# Patient Record
Sex: Female | Born: 1935 | Race: White | Hispanic: No | Smoking: Never smoker
Health system: Southern US, Community
[De-identification: ages and names within clinical notes are randomized; demographics above are authoritative.]

## PROBLEM LIST (undated history)

## (undated) DIAGNOSIS — I10 Essential (primary) hypertension: Secondary | ICD-10-CM

## (undated) DIAGNOSIS — I4891 Unspecified atrial fibrillation: Secondary | ICD-10-CM

## (undated) DIAGNOSIS — R011 Cardiac murmur, unspecified: Secondary | ICD-10-CM

## (undated) DIAGNOSIS — C4490 Unspecified malignant neoplasm of skin, unspecified: Secondary | ICD-10-CM

## (undated) DIAGNOSIS — S8990XA Unspecified injury of unspecified lower leg, initial encounter: Secondary | ICD-10-CM

## (undated) DIAGNOSIS — J45909 Unspecified asthma, uncomplicated: Secondary | ICD-10-CM

## (undated) DIAGNOSIS — N289 Disorder of kidney and ureter, unspecified: Secondary | ICD-10-CM

## (undated) HISTORY — DX: Unspecified atrial fibrillation: I48.91

## (undated) HISTORY — PX: TONSILLECTOMY: SUR1361

## (undated) HISTORY — PX: TOOTH EXTRACTION: SUR596

## (undated) HISTORY — PX: COLONOSCOPY: SHX174

## (undated) HISTORY — PX: RENAL ARTERY STENT: SHX2321

## (undated) HISTORY — PX: ABDOMINAL HYSTERECTOMY: SHX81

---

## 2004-04-04 ENCOUNTER — Other Ambulatory Visit: Payer: Self-pay

## 2004-04-07 ENCOUNTER — Ambulatory Visit: Payer: Self-pay | Admitting: Otolaryngology

## 2004-04-11 ENCOUNTER — Other Ambulatory Visit: Payer: Self-pay

## 2004-04-11 ENCOUNTER — Inpatient Hospital Stay: Payer: Self-pay | Admitting: Internal Medicine

## 2004-12-09 ENCOUNTER — Ambulatory Visit: Payer: Self-pay | Admitting: Cardiology

## 2004-12-13 ENCOUNTER — Ambulatory Visit: Payer: Self-pay | Admitting: Internal Medicine

## 2005-08-01 ENCOUNTER — Ambulatory Visit: Payer: Self-pay | Admitting: Internal Medicine

## 2006-03-08 ENCOUNTER — Ambulatory Visit: Payer: Self-pay | Admitting: Internal Medicine

## 2007-04-09 ENCOUNTER — Ambulatory Visit: Payer: Self-pay | Admitting: Internal Medicine

## 2008-03-09 ENCOUNTER — Ambulatory Visit: Payer: Self-pay | Admitting: Internal Medicine

## 2008-04-14 ENCOUNTER — Ambulatory Visit: Payer: Self-pay | Admitting: Internal Medicine

## 2009-04-29 ENCOUNTER — Ambulatory Visit: Payer: Self-pay | Admitting: Internal Medicine

## 2009-05-04 ENCOUNTER — Ambulatory Visit: Payer: Self-pay | Admitting: Internal Medicine

## 2009-09-20 ENCOUNTER — Ambulatory Visit: Payer: Self-pay | Admitting: Unknown Physician Specialty

## 2009-10-19 ENCOUNTER — Other Ambulatory Visit: Payer: Self-pay | Admitting: Rheumatology

## 2009-12-27 ENCOUNTER — Ambulatory Visit: Payer: Self-pay | Admitting: Vascular Surgery

## 2010-08-05 ENCOUNTER — Ambulatory Visit: Payer: Self-pay | Admitting: Internal Medicine

## 2011-06-12 DIAGNOSIS — F411 Generalized anxiety disorder: Secondary | ICD-10-CM | POA: Diagnosis not present

## 2011-06-12 DIAGNOSIS — M159 Polyosteoarthritis, unspecified: Secondary | ICD-10-CM | POA: Diagnosis not present

## 2011-06-12 DIAGNOSIS — N390 Urinary tract infection, site not specified: Secondary | ICD-10-CM | POA: Diagnosis not present

## 2011-06-12 DIAGNOSIS — I150 Renovascular hypertension: Secondary | ICD-10-CM | POA: Diagnosis not present

## 2011-06-12 DIAGNOSIS — K219 Gastro-esophageal reflux disease without esophagitis: Secondary | ICD-10-CM | POA: Diagnosis not present

## 2011-06-12 DIAGNOSIS — E782 Mixed hyperlipidemia: Secondary | ICD-10-CM | POA: Diagnosis not present

## 2011-06-12 DIAGNOSIS — J45909 Unspecified asthma, uncomplicated: Secondary | ICD-10-CM | POA: Diagnosis not present

## 2011-08-04 DIAGNOSIS — L570 Actinic keratosis: Secondary | ICD-10-CM | POA: Diagnosis not present

## 2011-08-04 DIAGNOSIS — Z85828 Personal history of other malignant neoplasm of skin: Secondary | ICD-10-CM | POA: Diagnosis not present

## 2011-08-04 DIAGNOSIS — C4492 Squamous cell carcinoma of skin, unspecified: Secondary | ICD-10-CM | POA: Diagnosis not present

## 2011-09-05 DIAGNOSIS — H35349 Macular cyst, hole, or pseudohole, unspecified eye: Secondary | ICD-10-CM | POA: Diagnosis not present

## 2011-09-05 DIAGNOSIS — H04129 Dry eye syndrome of unspecified lacrimal gland: Secondary | ICD-10-CM | POA: Diagnosis not present

## 2011-09-18 DIAGNOSIS — C44320 Squamous cell carcinoma of skin of unspecified parts of face: Secondary | ICD-10-CM | POA: Diagnosis not present

## 2011-10-10 DIAGNOSIS — M159 Polyosteoarthritis, unspecified: Secondary | ICD-10-CM | POA: Diagnosis not present

## 2011-10-10 DIAGNOSIS — E782 Mixed hyperlipidemia: Secondary | ICD-10-CM | POA: Diagnosis not present

## 2011-10-10 DIAGNOSIS — I150 Renovascular hypertension: Secondary | ICD-10-CM | POA: Diagnosis not present

## 2011-10-10 DIAGNOSIS — I701 Atherosclerosis of renal artery: Secondary | ICD-10-CM | POA: Diagnosis not present

## 2011-10-10 DIAGNOSIS — F411 Generalized anxiety disorder: Secondary | ICD-10-CM | POA: Diagnosis not present

## 2011-10-10 DIAGNOSIS — J45909 Unspecified asthma, uncomplicated: Secondary | ICD-10-CM | POA: Diagnosis not present

## 2011-10-24 DIAGNOSIS — Z85828 Personal history of other malignant neoplasm of skin: Secondary | ICD-10-CM | POA: Diagnosis not present

## 2011-10-24 DIAGNOSIS — L570 Actinic keratosis: Secondary | ICD-10-CM | POA: Diagnosis not present

## 2011-11-20 DIAGNOSIS — I10 Essential (primary) hypertension: Secondary | ICD-10-CM | POA: Diagnosis not present

## 2011-11-20 DIAGNOSIS — I150 Renovascular hypertension: Secondary | ICD-10-CM | POA: Diagnosis not present

## 2011-11-20 DIAGNOSIS — E782 Mixed hyperlipidemia: Secondary | ICD-10-CM | POA: Diagnosis not present

## 2011-11-20 DIAGNOSIS — Z79899 Other long term (current) drug therapy: Secondary | ICD-10-CM | POA: Diagnosis not present

## 2011-12-15 DIAGNOSIS — Z85828 Personal history of other malignant neoplasm of skin: Secondary | ICD-10-CM | POA: Diagnosis not present

## 2011-12-15 DIAGNOSIS — L570 Actinic keratosis: Secondary | ICD-10-CM | POA: Diagnosis not present

## 2011-12-18 DIAGNOSIS — I359 Nonrheumatic aortic valve disorder, unspecified: Secondary | ICD-10-CM | POA: Diagnosis not present

## 2011-12-18 DIAGNOSIS — J45909 Unspecified asthma, uncomplicated: Secondary | ICD-10-CM | POA: Diagnosis not present

## 2011-12-18 DIAGNOSIS — E782 Mixed hyperlipidemia: Secondary | ICD-10-CM | POA: Diagnosis not present

## 2011-12-18 DIAGNOSIS — I150 Renovascular hypertension: Secondary | ICD-10-CM | POA: Diagnosis not present

## 2011-12-18 DIAGNOSIS — J309 Allergic rhinitis, unspecified: Secondary | ICD-10-CM | POA: Diagnosis not present

## 2012-01-02 ENCOUNTER — Ambulatory Visit: Payer: Self-pay | Admitting: Internal Medicine

## 2012-01-02 DIAGNOSIS — Z1231 Encounter for screening mammogram for malignant neoplasm of breast: Secondary | ICD-10-CM | POA: Diagnosis not present

## 2012-01-02 DIAGNOSIS — R928 Other abnormal and inconclusive findings on diagnostic imaging of breast: Secondary | ICD-10-CM | POA: Diagnosis not present

## 2012-03-22 DIAGNOSIS — L578 Other skin changes due to chronic exposure to nonionizing radiation: Secondary | ICD-10-CM | POA: Diagnosis not present

## 2012-03-22 DIAGNOSIS — Z85828 Personal history of other malignant neoplasm of skin: Secondary | ICD-10-CM | POA: Diagnosis not present

## 2012-03-25 DIAGNOSIS — Z23 Encounter for immunization: Secondary | ICD-10-CM | POA: Diagnosis not present

## 2012-03-26 DIAGNOSIS — I10 Essential (primary) hypertension: Secondary | ICD-10-CM | POA: Diagnosis not present

## 2012-03-26 DIAGNOSIS — R079 Chest pain, unspecified: Secondary | ICD-10-CM | POA: Diagnosis not present

## 2012-03-26 DIAGNOSIS — R5381 Other malaise: Secondary | ICD-10-CM | POA: Diagnosis not present

## 2012-03-26 DIAGNOSIS — I359 Nonrheumatic aortic valve disorder, unspecified: Secondary | ICD-10-CM | POA: Diagnosis not present

## 2012-03-26 DIAGNOSIS — R110 Nausea: Secondary | ICD-10-CM | POA: Diagnosis not present

## 2012-03-26 DIAGNOSIS — I150 Renovascular hypertension: Secondary | ICD-10-CM | POA: Diagnosis not present

## 2012-03-26 DIAGNOSIS — K219 Gastro-esophageal reflux disease without esophagitis: Secondary | ICD-10-CM | POA: Diagnosis not present

## 2012-03-26 DIAGNOSIS — F329 Major depressive disorder, single episode, unspecified: Secondary | ICD-10-CM | POA: Diagnosis not present

## 2012-04-02 DIAGNOSIS — I10 Essential (primary) hypertension: Secondary | ICD-10-CM | POA: Diagnosis not present

## 2012-04-02 DIAGNOSIS — I359 Nonrheumatic aortic valve disorder, unspecified: Secondary | ICD-10-CM | POA: Diagnosis not present

## 2012-04-09 DIAGNOSIS — R011 Cardiac murmur, unspecified: Secondary | ICD-10-CM | POA: Diagnosis not present

## 2012-04-09 DIAGNOSIS — R7989 Other specified abnormal findings of blood chemistry: Secondary | ICD-10-CM | POA: Diagnosis not present

## 2012-04-09 DIAGNOSIS — R0602 Shortness of breath: Secondary | ICD-10-CM | POA: Diagnosis not present

## 2012-04-09 DIAGNOSIS — R944 Abnormal results of kidney function studies: Secondary | ICD-10-CM | POA: Diagnosis not present

## 2012-04-15 DIAGNOSIS — I10 Essential (primary) hypertension: Secondary | ICD-10-CM | POA: Diagnosis not present

## 2012-04-15 DIAGNOSIS — J449 Chronic obstructive pulmonary disease, unspecified: Secondary | ICD-10-CM | POA: Diagnosis not present

## 2012-04-22 DIAGNOSIS — L570 Actinic keratosis: Secondary | ICD-10-CM | POA: Diagnosis not present

## 2012-04-29 DIAGNOSIS — L570 Actinic keratosis: Secondary | ICD-10-CM | POA: Diagnosis not present

## 2012-05-10 DIAGNOSIS — L259 Unspecified contact dermatitis, unspecified cause: Secondary | ICD-10-CM | POA: Diagnosis not present

## 2012-05-10 DIAGNOSIS — IMO0002 Reserved for concepts with insufficient information to code with codable children: Secondary | ICD-10-CM | POA: Diagnosis not present

## 2012-05-10 DIAGNOSIS — L138 Other specified bullous disorders: Secondary | ICD-10-CM | POA: Diagnosis not present

## 2012-05-15 DIAGNOSIS — D492 Neoplasm of unspecified behavior of bone, soft tissue, and skin: Secondary | ICD-10-CM | POA: Diagnosis not present

## 2012-05-15 DIAGNOSIS — L089 Local infection of the skin and subcutaneous tissue, unspecified: Secondary | ICD-10-CM | POA: Insufficient documentation

## 2012-05-15 DIAGNOSIS — C7650 Malignant neoplasm of unspecified lower limb: Secondary | ICD-10-CM | POA: Diagnosis not present

## 2012-06-07 DIAGNOSIS — D492 Neoplasm of unspecified behavior of bone, soft tissue, and skin: Secondary | ICD-10-CM | POA: Diagnosis not present

## 2012-06-07 DIAGNOSIS — D485 Neoplasm of uncertain behavior of skin: Secondary | ICD-10-CM | POA: Diagnosis not present

## 2012-06-07 DIAGNOSIS — Z85828 Personal history of other malignant neoplasm of skin: Secondary | ICD-10-CM | POA: Diagnosis not present

## 2012-06-12 DIAGNOSIS — L570 Actinic keratosis: Secondary | ICD-10-CM | POA: Diagnosis not present

## 2012-06-12 DIAGNOSIS — C44721 Squamous cell carcinoma of skin of unspecified lower limb, including hip: Secondary | ICD-10-CM | POA: Diagnosis not present

## 2012-06-12 DIAGNOSIS — D492 Neoplasm of unspecified behavior of bone, soft tissue, and skin: Secondary | ICD-10-CM | POA: Diagnosis not present

## 2012-06-24 DIAGNOSIS — R011 Cardiac murmur, unspecified: Secondary | ICD-10-CM | POA: Diagnosis not present

## 2012-06-24 DIAGNOSIS — N189 Chronic kidney disease, unspecified: Secondary | ICD-10-CM | POA: Diagnosis not present

## 2012-06-24 DIAGNOSIS — J309 Allergic rhinitis, unspecified: Secondary | ICD-10-CM | POA: Diagnosis not present

## 2012-06-24 DIAGNOSIS — N318 Other neuromuscular dysfunction of bladder: Secondary | ICD-10-CM | POA: Diagnosis not present

## 2012-06-24 DIAGNOSIS — K5900 Constipation, unspecified: Secondary | ICD-10-CM | POA: Diagnosis not present

## 2012-06-24 DIAGNOSIS — R32 Unspecified urinary incontinence: Secondary | ICD-10-CM | POA: Diagnosis not present

## 2012-06-24 DIAGNOSIS — I150 Renovascular hypertension: Secondary | ICD-10-CM | POA: Diagnosis not present

## 2012-06-24 DIAGNOSIS — K219 Gastro-esophageal reflux disease without esophagitis: Secondary | ICD-10-CM | POA: Diagnosis not present

## 2012-08-01 DIAGNOSIS — D0470 Carcinoma in situ of skin of unspecified lower limb, including hip: Secondary | ICD-10-CM | POA: Diagnosis not present

## 2012-08-07 DIAGNOSIS — C44721 Squamous cell carcinoma of skin of unspecified lower limb, including hip: Secondary | ICD-10-CM | POA: Diagnosis not present

## 2012-10-09 DIAGNOSIS — Z85828 Personal history of other malignant neoplasm of skin: Secondary | ICD-10-CM | POA: Diagnosis not present

## 2012-10-09 DIAGNOSIS — L570 Actinic keratosis: Secondary | ICD-10-CM | POA: Diagnosis not present

## 2012-10-18 DIAGNOSIS — L0291 Cutaneous abscess, unspecified: Secondary | ICD-10-CM | POA: Diagnosis not present

## 2012-10-18 DIAGNOSIS — L0390 Cellulitis, unspecified: Secondary | ICD-10-CM | POA: Diagnosis not present

## 2012-10-23 DIAGNOSIS — I6529 Occlusion and stenosis of unspecified carotid artery: Secondary | ICD-10-CM | POA: Diagnosis not present

## 2012-10-23 DIAGNOSIS — J45909 Unspecified asthma, uncomplicated: Secondary | ICD-10-CM | POA: Diagnosis not present

## 2012-10-23 DIAGNOSIS — I10 Essential (primary) hypertension: Secondary | ICD-10-CM | POA: Diagnosis not present

## 2012-10-23 DIAGNOSIS — Z006 Encounter for examination for normal comparison and control in clinical research program: Secondary | ICD-10-CM | POA: Diagnosis not present

## 2012-10-25 DIAGNOSIS — L0390 Cellulitis, unspecified: Secondary | ICD-10-CM | POA: Diagnosis not present

## 2012-10-25 DIAGNOSIS — L0291 Cutaneous abscess, unspecified: Secondary | ICD-10-CM | POA: Diagnosis not present

## 2012-11-14 DIAGNOSIS — L0291 Cutaneous abscess, unspecified: Secondary | ICD-10-CM | POA: Diagnosis not present

## 2012-11-14 DIAGNOSIS — L0390 Cellulitis, unspecified: Secondary | ICD-10-CM | POA: Diagnosis not present

## 2012-11-14 DIAGNOSIS — L570 Actinic keratosis: Secondary | ICD-10-CM | POA: Diagnosis not present

## 2012-12-12 DIAGNOSIS — L570 Actinic keratosis: Secondary | ICD-10-CM | POA: Diagnosis not present

## 2012-12-12 DIAGNOSIS — D485 Neoplasm of uncertain behavior of skin: Secondary | ICD-10-CM | POA: Diagnosis not present

## 2012-12-12 DIAGNOSIS — L089 Local infection of the skin and subcutaneous tissue, unspecified: Secondary | ICD-10-CM | POA: Diagnosis not present

## 2013-01-02 DIAGNOSIS — L570 Actinic keratosis: Secondary | ICD-10-CM | POA: Diagnosis not present

## 2013-01-02 DIAGNOSIS — L259 Unspecified contact dermatitis, unspecified cause: Secondary | ICD-10-CM | POA: Diagnosis not present

## 2013-01-02 DIAGNOSIS — D485 Neoplasm of uncertain behavior of skin: Secondary | ICD-10-CM | POA: Diagnosis not present

## 2013-01-24 DIAGNOSIS — L259 Unspecified contact dermatitis, unspecified cause: Secondary | ICD-10-CM | POA: Diagnosis not present

## 2013-01-29 DIAGNOSIS — L259 Unspecified contact dermatitis, unspecified cause: Secondary | ICD-10-CM | POA: Diagnosis not present

## 2013-02-24 DIAGNOSIS — N390 Urinary tract infection, site not specified: Secondary | ICD-10-CM | POA: Diagnosis not present

## 2013-02-24 DIAGNOSIS — I150 Renovascular hypertension: Secondary | ICD-10-CM | POA: Diagnosis not present

## 2013-02-24 DIAGNOSIS — F411 Generalized anxiety disorder: Secondary | ICD-10-CM | POA: Diagnosis not present

## 2013-02-24 DIAGNOSIS — R339 Retention of urine, unspecified: Secondary | ICD-10-CM | POA: Diagnosis not present

## 2013-02-24 DIAGNOSIS — N189 Chronic kidney disease, unspecified: Secondary | ICD-10-CM | POA: Diagnosis not present

## 2013-02-24 DIAGNOSIS — L259 Unspecified contact dermatitis, unspecified cause: Secondary | ICD-10-CM | POA: Diagnosis not present

## 2013-02-24 DIAGNOSIS — R011 Cardiac murmur, unspecified: Secondary | ICD-10-CM | POA: Diagnosis not present

## 2013-02-24 DIAGNOSIS — R300 Dysuria: Secondary | ICD-10-CM | POA: Diagnosis not present

## 2013-02-26 DIAGNOSIS — L578 Other skin changes due to chronic exposure to nonionizing radiation: Secondary | ICD-10-CM | POA: Diagnosis not present

## 2013-02-26 DIAGNOSIS — Z85828 Personal history of other malignant neoplasm of skin: Secondary | ICD-10-CM | POA: Diagnosis not present

## 2013-02-26 DIAGNOSIS — C44721 Squamous cell carcinoma of skin of unspecified lower limb, including hip: Secondary | ICD-10-CM | POA: Diagnosis not present

## 2013-02-26 DIAGNOSIS — L908 Other atrophic disorders of skin: Secondary | ICD-10-CM | POA: Diagnosis not present

## 2013-03-12 DIAGNOSIS — Z23 Encounter for immunization: Secondary | ICD-10-CM | POA: Diagnosis not present

## 2013-03-13 ENCOUNTER — Ambulatory Visit: Payer: Self-pay | Admitting: Physician Assistant
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
## 2013-03-17 DIAGNOSIS — R011 Cardiac murmur, unspecified: Secondary | ICD-10-CM | POA: Diagnosis not present

## 2013-03-24 DIAGNOSIS — I10 Essential (primary) hypertension: Secondary | ICD-10-CM | POA: Diagnosis not present

## 2013-03-24 DIAGNOSIS — N390 Urinary tract infection, site not specified: Secondary | ICD-10-CM | POA: Diagnosis not present

## 2013-03-24 DIAGNOSIS — Z0000 Encounter for general adult medical examination without abnormal findings: Secondary | ICD-10-CM | POA: Diagnosis not present

## 2013-03-24 DIAGNOSIS — N189 Chronic kidney disease, unspecified: Secondary | ICD-10-CM | POA: Diagnosis not present

## 2013-03-24 DIAGNOSIS — R7989 Other specified abnormal findings of blood chemistry: Secondary | ICD-10-CM | POA: Diagnosis not present

## 2013-04-04 DIAGNOSIS — L259 Unspecified contact dermatitis, unspecified cause: Secondary | ICD-10-CM | POA: Diagnosis not present

## 2013-04-09 DIAGNOSIS — L259 Unspecified contact dermatitis, unspecified cause: Secondary | ICD-10-CM | POA: Diagnosis not present

## 2013-04-11 DIAGNOSIS — L259 Unspecified contact dermatitis, unspecified cause: Secondary | ICD-10-CM | POA: Diagnosis not present

## 2013-04-15 DIAGNOSIS — L578 Other skin changes due to chronic exposure to nonionizing radiation: Secondary | ICD-10-CM | POA: Diagnosis not present

## 2013-04-15 DIAGNOSIS — Z85828 Personal history of other malignant neoplasm of skin: Secondary | ICD-10-CM | POA: Diagnosis not present

## 2013-07-09 DIAGNOSIS — L259 Unspecified contact dermatitis, unspecified cause: Secondary | ICD-10-CM | POA: Diagnosis not present

## 2013-07-09 DIAGNOSIS — I6529 Occlusion and stenosis of unspecified carotid artery: Secondary | ICD-10-CM | POA: Diagnosis not present

## 2013-07-09 DIAGNOSIS — K219 Gastro-esophageal reflux disease without esophagitis: Secondary | ICD-10-CM | POA: Diagnosis not present

## 2013-07-09 DIAGNOSIS — Q251 Coarctation of aorta: Secondary | ICD-10-CM | POA: Diagnosis not present

## 2013-07-09 DIAGNOSIS — D649 Anemia, unspecified: Secondary | ICD-10-CM | POA: Diagnosis not present

## 2013-07-09 DIAGNOSIS — J45909 Unspecified asthma, uncomplicated: Secondary | ICD-10-CM | POA: Diagnosis not present

## 2013-07-09 DIAGNOSIS — M810 Age-related osteoporosis without current pathological fracture: Secondary | ICD-10-CM | POA: Diagnosis not present

## 2013-07-09 DIAGNOSIS — Q2529 Other atresia of aorta: Secondary | ICD-10-CM | POA: Diagnosis not present

## 2013-07-09 DIAGNOSIS — I10 Essential (primary) hypertension: Secondary | ICD-10-CM | POA: Diagnosis not present

## 2013-09-01 DIAGNOSIS — J45909 Unspecified asthma, uncomplicated: Secondary | ICD-10-CM | POA: Diagnosis not present

## 2013-09-01 DIAGNOSIS — R011 Cardiac murmur, unspecified: Secondary | ICD-10-CM | POA: Diagnosis not present

## 2013-09-01 DIAGNOSIS — J309 Allergic rhinitis, unspecified: Secondary | ICD-10-CM | POA: Diagnosis not present

## 2013-09-01 DIAGNOSIS — E2839 Other primary ovarian failure: Secondary | ICD-10-CM | POA: Diagnosis not present

## 2013-09-01 DIAGNOSIS — I10 Essential (primary) hypertension: Secondary | ICD-10-CM | POA: Diagnosis not present

## 2013-09-01 DIAGNOSIS — F411 Generalized anxiety disorder: Secondary | ICD-10-CM | POA: Diagnosis not present

## 2013-09-01 DIAGNOSIS — E782 Mixed hyperlipidemia: Secondary | ICD-10-CM | POA: Diagnosis not present

## 2013-09-16 DIAGNOSIS — I150 Renovascular hypertension: Secondary | ICD-10-CM | POA: Diagnosis not present

## 2013-09-16 DIAGNOSIS — M810 Age-related osteoporosis without current pathological fracture: Secondary | ICD-10-CM | POA: Diagnosis not present

## 2013-09-16 DIAGNOSIS — I10 Essential (primary) hypertension: Secondary | ICD-10-CM | POA: Diagnosis not present

## 2013-10-10 DIAGNOSIS — R7989 Other specified abnormal findings of blood chemistry: Secondary | ICD-10-CM | POA: Diagnosis not present

## 2013-10-10 DIAGNOSIS — D539 Nutritional anemia, unspecified: Secondary | ICD-10-CM | POA: Diagnosis not present

## 2013-10-21 DIAGNOSIS — M159 Polyosteoarthritis, unspecified: Secondary | ICD-10-CM | POA: Diagnosis not present

## 2013-10-21 DIAGNOSIS — I150 Renovascular hypertension: Secondary | ICD-10-CM | POA: Diagnosis not present

## 2013-10-21 DIAGNOSIS — I10 Essential (primary) hypertension: Secondary | ICD-10-CM | POA: Diagnosis not present

## 2013-10-23 DIAGNOSIS — I10 Essential (primary) hypertension: Secondary | ICD-10-CM | POA: Diagnosis not present

## 2013-10-23 DIAGNOSIS — I150 Renovascular hypertension: Secondary | ICD-10-CM | POA: Diagnosis not present

## 2013-11-11 DIAGNOSIS — I150 Renovascular hypertension: Secondary | ICD-10-CM | POA: Diagnosis not present

## 2013-11-11 DIAGNOSIS — F411 Generalized anxiety disorder: Secondary | ICD-10-CM | POA: Diagnosis not present

## 2013-11-11 DIAGNOSIS — J45909 Unspecified asthma, uncomplicated: Secondary | ICD-10-CM | POA: Diagnosis not present

## 2013-11-11 DIAGNOSIS — I10 Essential (primary) hypertension: Secondary | ICD-10-CM | POA: Diagnosis not present

## 2013-11-13 DIAGNOSIS — H04129 Dry eye syndrome of unspecified lacrimal gland: Secondary | ICD-10-CM | POA: Diagnosis not present

## 2013-11-13 DIAGNOSIS — H35349 Macular cyst, hole, or pseudohole, unspecified eye: Secondary | ICD-10-CM | POA: Diagnosis not present

## 2013-11-13 DIAGNOSIS — H5200 Hypermetropia, unspecified eye: Secondary | ICD-10-CM | POA: Diagnosis not present

## 2013-11-13 DIAGNOSIS — H2510 Age-related nuclear cataract, unspecified eye: Secondary | ICD-10-CM | POA: Diagnosis not present

## 2013-12-08 DIAGNOSIS — I150 Renovascular hypertension: Secondary | ICD-10-CM | POA: Diagnosis not present

## 2013-12-18 DIAGNOSIS — F411 Generalized anxiety disorder: Secondary | ICD-10-CM | POA: Diagnosis not present

## 2013-12-18 DIAGNOSIS — E782 Mixed hyperlipidemia: Secondary | ICD-10-CM | POA: Diagnosis not present

## 2013-12-18 DIAGNOSIS — I150 Renovascular hypertension: Secondary | ICD-10-CM | POA: Diagnosis not present

## 2013-12-18 DIAGNOSIS — I701 Atherosclerosis of renal artery: Secondary | ICD-10-CM | POA: Diagnosis not present

## 2013-12-31 DIAGNOSIS — Z85828 Personal history of other malignant neoplasm of skin: Secondary | ICD-10-CM | POA: Diagnosis not present

## 2013-12-31 DIAGNOSIS — C44721 Squamous cell carcinoma of skin of unspecified lower limb, including hip: Secondary | ICD-10-CM | POA: Diagnosis not present

## 2013-12-31 DIAGNOSIS — D0470 Carcinoma in situ of skin of unspecified lower limb, including hip: Secondary | ICD-10-CM | POA: Diagnosis not present

## 2013-12-31 DIAGNOSIS — D485 Neoplasm of uncertain behavior of skin: Secondary | ICD-10-CM | POA: Diagnosis not present

## 2014-01-05 DIAGNOSIS — F411 Generalized anxiety disorder: Secondary | ICD-10-CM | POA: Diagnosis not present

## 2014-01-05 DIAGNOSIS — J309 Allergic rhinitis, unspecified: Secondary | ICD-10-CM | POA: Diagnosis not present

## 2014-01-05 DIAGNOSIS — J45909 Unspecified asthma, uncomplicated: Secondary | ICD-10-CM | POA: Diagnosis not present

## 2014-01-05 DIAGNOSIS — I150 Renovascular hypertension: Secondary | ICD-10-CM | POA: Diagnosis not present

## 2014-01-15 DIAGNOSIS — N183 Chronic kidney disease, stage 3 unspecified: Secondary | ICD-10-CM | POA: Diagnosis not present

## 2014-01-15 DIAGNOSIS — I10 Essential (primary) hypertension: Secondary | ICD-10-CM | POA: Diagnosis not present

## 2014-01-15 DIAGNOSIS — N269 Renal sclerosis, unspecified: Secondary | ICD-10-CM | POA: Diagnosis not present

## 2014-01-15 DIAGNOSIS — I701 Atherosclerosis of renal artery: Secondary | ICD-10-CM | POA: Diagnosis not present

## 2014-01-16 DIAGNOSIS — N8111 Cystocele, midline: Secondary | ICD-10-CM | POA: Diagnosis not present

## 2014-01-16 DIAGNOSIS — Z0189 Encounter for other specified special examinations: Secondary | ICD-10-CM | POA: Diagnosis not present

## 2014-01-20 DIAGNOSIS — M810 Age-related osteoporosis without current pathological fracture: Secondary | ICD-10-CM | POA: Diagnosis not present

## 2014-01-20 DIAGNOSIS — M542 Cervicalgia: Secondary | ICD-10-CM | POA: Diagnosis not present

## 2014-01-20 DIAGNOSIS — M545 Low back pain, unspecified: Secondary | ICD-10-CM | POA: Diagnosis not present

## 2014-01-20 DIAGNOSIS — M546 Pain in thoracic spine: Secondary | ICD-10-CM | POA: Diagnosis not present

## 2014-01-22 DIAGNOSIS — I150 Renovascular hypertension: Secondary | ICD-10-CM | POA: Diagnosis not present

## 2014-01-22 DIAGNOSIS — J45909 Unspecified asthma, uncomplicated: Secondary | ICD-10-CM | POA: Diagnosis not present

## 2014-01-22 DIAGNOSIS — F411 Generalized anxiety disorder: Secondary | ICD-10-CM | POA: Diagnosis not present

## 2014-01-22 DIAGNOSIS — J309 Allergic rhinitis, unspecified: Secondary | ICD-10-CM | POA: Diagnosis not present

## 2014-01-22 DIAGNOSIS — K219 Gastro-esophageal reflux disease without esophagitis: Secondary | ICD-10-CM | POA: Diagnosis not present

## 2014-01-27 DIAGNOSIS — M542 Cervicalgia: Secondary | ICD-10-CM | POA: Diagnosis not present

## 2014-01-30 DIAGNOSIS — L570 Actinic keratosis: Secondary | ICD-10-CM | POA: Diagnosis not present

## 2014-01-30 DIAGNOSIS — D0470 Carcinoma in situ of skin of unspecified lower limb, including hip: Secondary | ICD-10-CM | POA: Diagnosis not present

## 2014-02-04 DIAGNOSIS — M1610 Unilateral primary osteoarthritis, unspecified hip: Secondary | ICD-10-CM | POA: Diagnosis not present

## 2014-02-04 DIAGNOSIS — M533 Sacrococcygeal disorders, not elsewhere classified: Secondary | ICD-10-CM | POA: Diagnosis not present

## 2014-02-04 DIAGNOSIS — M25559 Pain in unspecified hip: Secondary | ICD-10-CM | POA: Diagnosis not present

## 2014-02-04 DIAGNOSIS — S79919A Unspecified injury of unspecified hip, initial encounter: Secondary | ICD-10-CM | POA: Diagnosis not present

## 2014-02-04 DIAGNOSIS — M169 Osteoarthritis of hip, unspecified: Secondary | ICD-10-CM | POA: Diagnosis not present

## 2014-02-19 DIAGNOSIS — C44721 Squamous cell carcinoma of skin of unspecified lower limb, including hip: Secondary | ICD-10-CM | POA: Diagnosis not present

## 2014-02-20 DIAGNOSIS — N269 Renal sclerosis, unspecified: Secondary | ICD-10-CM | POA: Diagnosis not present

## 2014-02-20 DIAGNOSIS — I701 Atherosclerosis of renal artery: Secondary | ICD-10-CM | POA: Diagnosis not present

## 2014-02-20 DIAGNOSIS — N183 Chronic kidney disease, stage 3 unspecified: Secondary | ICD-10-CM | POA: Diagnosis not present

## 2014-02-20 DIAGNOSIS — I10 Essential (primary) hypertension: Secondary | ICD-10-CM | POA: Diagnosis not present

## 2014-03-04 DIAGNOSIS — L570 Actinic keratosis: Secondary | ICD-10-CM | POA: Diagnosis not present

## 2014-03-09 ENCOUNTER — Ambulatory Visit: Payer: Self-pay | Admitting: Nephrology

## 2014-03-19 DIAGNOSIS — Z23 Encounter for immunization: Secondary | ICD-10-CM | POA: Diagnosis not present

## 2014-03-31 DIAGNOSIS — F411 Generalized anxiety disorder: Secondary | ICD-10-CM | POA: Diagnosis not present

## 2014-03-31 DIAGNOSIS — I10 Essential (primary) hypertension: Secondary | ICD-10-CM | POA: Diagnosis not present

## 2014-03-31 DIAGNOSIS — M150 Primary generalized (osteo)arthritis: Secondary | ICD-10-CM | POA: Diagnosis not present

## 2014-03-31 DIAGNOSIS — M810 Age-related osteoporosis without current pathological fracture: Secondary | ICD-10-CM | POA: Diagnosis not present

## 2014-03-31 DIAGNOSIS — K219 Gastro-esophageal reflux disease without esophagitis: Secondary | ICD-10-CM | POA: Diagnosis not present

## 2014-03-31 DIAGNOSIS — E2839 Other primary ovarian failure: Secondary | ICD-10-CM | POA: Diagnosis not present

## 2014-04-16 DIAGNOSIS — L905 Scar conditions and fibrosis of skin: Secondary | ICD-10-CM | POA: Diagnosis not present

## 2014-04-16 DIAGNOSIS — Z85828 Personal history of other malignant neoplasm of skin: Secondary | ICD-10-CM | POA: Diagnosis not present

## 2014-04-27 DIAGNOSIS — N261 Atrophy of kidney (terminal): Secondary | ICD-10-CM | POA: Diagnosis not present

## 2014-04-27 DIAGNOSIS — I701 Atherosclerosis of renal artery: Secondary | ICD-10-CM | POA: Diagnosis not present

## 2014-04-27 DIAGNOSIS — I10 Essential (primary) hypertension: Secondary | ICD-10-CM | POA: Diagnosis not present

## 2014-04-27 DIAGNOSIS — N183 Chronic kidney disease, stage 3 (moderate): Secondary | ICD-10-CM | POA: Diagnosis not present

## 2014-04-30 DIAGNOSIS — I701 Atherosclerosis of renal artery: Secondary | ICD-10-CM | POA: Diagnosis not present

## 2014-05-01 ENCOUNTER — Ambulatory Visit: Payer: Self-pay | Admitting: Nephrology

## 2014-05-01 DIAGNOSIS — N261 Atrophy of kidney (terminal): Secondary | ICD-10-CM | POA: Diagnosis not present

## 2014-05-01 DIAGNOSIS — N183 Chronic kidney disease, stage 3 (moderate): Secondary | ICD-10-CM | POA: Diagnosis not present

## 2014-05-06 DIAGNOSIS — C44729 Squamous cell carcinoma of skin of left lower limb, including hip: Secondary | ICD-10-CM | POA: Diagnosis not present

## 2014-05-06 DIAGNOSIS — D485 Neoplasm of uncertain behavior of skin: Secondary | ICD-10-CM | POA: Diagnosis not present

## 2014-05-06 DIAGNOSIS — L570 Actinic keratosis: Secondary | ICD-10-CM | POA: Diagnosis not present

## 2014-06-09 DIAGNOSIS — C44729 Squamous cell carcinoma of skin of left lower limb, including hip: Secondary | ICD-10-CM | POA: Diagnosis not present

## 2014-06-11 DIAGNOSIS — I10 Essential (primary) hypertension: Secondary | ICD-10-CM | POA: Diagnosis not present

## 2014-06-11 DIAGNOSIS — Z0001 Encounter for general adult medical examination with abnormal findings: Secondary | ICD-10-CM | POA: Diagnosis not present

## 2014-06-11 DIAGNOSIS — R300 Dysuria: Secondary | ICD-10-CM | POA: Diagnosis not present

## 2014-06-11 DIAGNOSIS — E782 Mixed hyperlipidemia: Secondary | ICD-10-CM | POA: Diagnosis not present

## 2014-06-12 DIAGNOSIS — N183 Chronic kidney disease, stage 3 (moderate): Secondary | ICD-10-CM | POA: Diagnosis not present

## 2014-06-12 DIAGNOSIS — I701 Atherosclerosis of renal artery: Secondary | ICD-10-CM | POA: Diagnosis not present

## 2014-06-12 DIAGNOSIS — N261 Atrophy of kidney (terminal): Secondary | ICD-10-CM | POA: Diagnosis not present

## 2014-06-12 DIAGNOSIS — I10 Essential (primary) hypertension: Secondary | ICD-10-CM | POA: Diagnosis not present

## 2014-08-12 DIAGNOSIS — R922 Inconclusive mammogram: Secondary | ICD-10-CM | POA: Diagnosis not present

## 2014-08-12 DIAGNOSIS — R921 Mammographic calcification found on diagnostic imaging of breast: Secondary | ICD-10-CM | POA: Diagnosis not present

## 2014-08-12 DIAGNOSIS — Z1231 Encounter for screening mammogram for malignant neoplasm of breast: Secondary | ICD-10-CM | POA: Diagnosis not present

## 2014-09-17 DIAGNOSIS — I701 Atherosclerosis of renal artery: Secondary | ICD-10-CM | POA: Diagnosis not present

## 2014-09-17 DIAGNOSIS — I10 Essential (primary) hypertension: Secondary | ICD-10-CM | POA: Diagnosis not present

## 2014-09-17 DIAGNOSIS — N8110 Cystocele, unspecified: Secondary | ICD-10-CM | POA: Diagnosis not present

## 2014-09-17 DIAGNOSIS — N183 Chronic kidney disease, stage 3 (moderate): Secondary | ICD-10-CM | POA: Diagnosis not present

## 2014-09-17 DIAGNOSIS — N3946 Mixed incontinence: Secondary | ICD-10-CM | POA: Diagnosis not present

## 2014-09-17 DIAGNOSIS — J309 Allergic rhinitis, unspecified: Secondary | ICD-10-CM | POA: Diagnosis not present

## 2014-12-15 DIAGNOSIS — N8110 Cystocele, unspecified: Secondary | ICD-10-CM | POA: Diagnosis not present

## 2014-12-15 DIAGNOSIS — N3946 Mixed incontinence: Secondary | ICD-10-CM | POA: Diagnosis not present

## 2014-12-15 DIAGNOSIS — I10 Essential (primary) hypertension: Secondary | ICD-10-CM | POA: Diagnosis not present

## 2014-12-15 DIAGNOSIS — D649 Anemia, unspecified: Secondary | ICD-10-CM | POA: Diagnosis not present

## 2014-12-15 DIAGNOSIS — L570 Actinic keratosis: Secondary | ICD-10-CM | POA: Diagnosis not present

## 2014-12-15 DIAGNOSIS — K5900 Constipation, unspecified: Secondary | ICD-10-CM | POA: Diagnosis not present

## 2014-12-15 DIAGNOSIS — N183 Chronic kidney disease, stage 3 (moderate): Secondary | ICD-10-CM | POA: Diagnosis not present

## 2015-01-25 DIAGNOSIS — H04123 Dry eye syndrome of bilateral lacrimal glands: Secondary | ICD-10-CM | POA: Diagnosis not present

## 2015-01-25 DIAGNOSIS — H35341 Macular cyst, hole, or pseudohole, right eye: Secondary | ICD-10-CM | POA: Diagnosis not present

## 2015-01-25 DIAGNOSIS — H35033 Hypertensive retinopathy, bilateral: Secondary | ICD-10-CM | POA: Diagnosis not present

## 2015-01-25 DIAGNOSIS — H43819 Vitreous degeneration, unspecified eye: Secondary | ICD-10-CM | POA: Diagnosis not present

## 2015-03-10 DIAGNOSIS — I10 Essential (primary) hypertension: Secondary | ICD-10-CM | POA: Diagnosis not present

## 2015-03-10 DIAGNOSIS — Z0001 Encounter for general adult medical examination with abnormal findings: Secondary | ICD-10-CM | POA: Diagnosis not present

## 2015-03-10 DIAGNOSIS — N183 Chronic kidney disease, stage 3 (moderate): Secondary | ICD-10-CM | POA: Diagnosis not present

## 2015-03-10 DIAGNOSIS — D649 Anemia, unspecified: Secondary | ICD-10-CM | POA: Diagnosis not present

## 2015-03-16 DIAGNOSIS — M150 Primary generalized (osteo)arthritis: Secondary | ICD-10-CM | POA: Diagnosis not present

## 2015-03-16 DIAGNOSIS — N183 Chronic kidney disease, stage 3 (moderate): Secondary | ICD-10-CM | POA: Diagnosis not present

## 2015-03-16 DIAGNOSIS — F411 Generalized anxiety disorder: Secondary | ICD-10-CM | POA: Diagnosis not present

## 2015-03-16 DIAGNOSIS — K5900 Constipation, unspecified: Secondary | ICD-10-CM | POA: Diagnosis not present

## 2015-03-16 DIAGNOSIS — I10 Essential (primary) hypertension: Secondary | ICD-10-CM | POA: Diagnosis not present

## 2015-03-25 ENCOUNTER — Other Ambulatory Visit: Payer: Self-pay | Admitting: Physician Assistant

## 2015-03-25 DIAGNOSIS — M542 Cervicalgia: Secondary | ICD-10-CM

## 2015-03-25 DIAGNOSIS — M545 Low back pain: Secondary | ICD-10-CM

## 2015-04-07 DIAGNOSIS — N183 Chronic kidney disease, stage 3 (moderate): Secondary | ICD-10-CM | POA: Diagnosis not present

## 2015-04-07 DIAGNOSIS — I701 Atherosclerosis of renal artery: Secondary | ICD-10-CM | POA: Diagnosis not present

## 2015-04-08 ENCOUNTER — Ambulatory Visit
Admission: RE | Admit: 2015-04-08 | Discharge: 2015-04-08 | Disposition: A | Discharge status: Home / Self Care | Payer: Medicare Other | Source: Ambulatory Visit | Attending: Physician Assistant | Admitting: Physician Assistant

## 2015-04-08 DIAGNOSIS — M2578 Osteophyte, vertebrae: Secondary | ICD-10-CM | POA: Diagnosis not present

## 2015-04-08 DIAGNOSIS — M50221 Other cervical disc displacement at C4-C5 level: Secondary | ICD-10-CM | POA: Insufficient documentation

## 2015-04-08 DIAGNOSIS — M4802 Spinal stenosis, cervical region: Secondary | ICD-10-CM | POA: Diagnosis not present

## 2015-04-08 DIAGNOSIS — M545 Low back pain: Secondary | ICD-10-CM

## 2015-04-08 DIAGNOSIS — M5126 Other intervertebral disc displacement, lumbar region: Secondary | ICD-10-CM | POA: Diagnosis not present

## 2015-04-08 DIAGNOSIS — Z23 Encounter for immunization: Secondary | ICD-10-CM | POA: Diagnosis not present

## 2015-04-08 DIAGNOSIS — M542 Cervicalgia: Secondary | ICD-10-CM

## 2015-04-09 DIAGNOSIS — M5010 Cervical disc disorder with radiculopathy, unspecified cervical region: Secondary | ICD-10-CM | POA: Diagnosis not present

## 2015-04-09 DIAGNOSIS — N183 Chronic kidney disease, stage 3 (moderate): Secondary | ICD-10-CM | POA: Diagnosis not present

## 2015-04-09 DIAGNOSIS — M5137 Other intervertebral disc degeneration, lumbosacral region: Secondary | ICD-10-CM | POA: Diagnosis not present

## 2015-04-09 DIAGNOSIS — K5900 Constipation, unspecified: Secondary | ICD-10-CM | POA: Diagnosis not present

## 2015-04-09 DIAGNOSIS — I701 Atherosclerosis of renal artery: Secondary | ICD-10-CM | POA: Diagnosis not present

## 2015-04-09 DIAGNOSIS — I10 Essential (primary) hypertension: Secondary | ICD-10-CM | POA: Diagnosis not present

## 2015-04-19 DIAGNOSIS — I150 Renovascular hypertension: Secondary | ICD-10-CM | POA: Diagnosis not present

## 2015-04-19 DIAGNOSIS — R1030 Lower abdominal pain, unspecified: Secondary | ICD-10-CM | POA: Diagnosis not present

## 2015-04-19 DIAGNOSIS — M50121 Cervical disc disorder at C4-C5 level with radiculopathy: Secondary | ICD-10-CM | POA: Diagnosis not present

## 2015-04-28 DIAGNOSIS — R1084 Generalized abdominal pain: Secondary | ICD-10-CM | POA: Diagnosis not present

## 2015-05-28 DIAGNOSIS — H18461 Peripheral corneal degeneration, right eye: Secondary | ICD-10-CM | POA: Diagnosis not present

## 2016-02-22 ENCOUNTER — Other Ambulatory Visit: Payer: Self-pay | Admitting: Physician Assistant

## 2016-02-22 DIAGNOSIS — R0602 Shortness of breath: Secondary | ICD-10-CM

## 2016-02-29 ENCOUNTER — Ambulatory Visit
Admission: RE | Admit: 2016-02-29 | Discharge: 2016-02-29 | Disposition: A | Discharge status: Home / Self Care | Payer: Medicare Other | Source: Ambulatory Visit | Attending: Physician Assistant | Admitting: Physician Assistant

## 2016-02-29 DIAGNOSIS — R0602 Shortness of breath: Secondary | ICD-10-CM | POA: Diagnosis not present

## 2016-02-29 DIAGNOSIS — R011 Cardiac murmur, unspecified: Secondary | ICD-10-CM | POA: Insufficient documentation

## 2016-02-29 HISTORY — DX: Essential (primary) hypertension: I10

## 2016-02-29 HISTORY — DX: Unspecified asthma, uncomplicated: J45.909

## 2016-02-29 HISTORY — DX: Cardiac murmur, unspecified: R01.1

## 2016-02-29 MED ORDER — TECHNETIUM TC 99M TETROFOSMIN IV KIT
33.0000 | PACK | Freq: Once | INTRAVENOUS | Status: AC | PRN
  Administered 2016-02-29: 31.031 via INTRAVENOUS

## 2016-02-29 MED ORDER — REGADENOSON 0.4 MG/5ML IV SOLN
0.4000 mg | Freq: Once | INTRAVENOUS | Status: AC
  Administered 2016-02-29: 0.4 mg via INTRAVENOUS
  Filled 2016-02-29: qty 5

## 2016-02-29 MED ORDER — TECHNETIUM TC 99M TETROFOSMIN IV KIT
13.0000 | PACK | Freq: Once | INTRAVENOUS | Status: AC | PRN
  Administered 2016-02-29: 13.362 via INTRAVENOUS

## 2016-03-01 LAB — NM MYOCAR MULTI W/SPECT W/WALL MOTION / EF
CHL CUP NUCLEAR SRS: 6
CHL CUP NUCLEAR SSS: 6
CHL CUP RESTING HR STRESS: 70 {beats}/min
CHL CUP STRESS STAGE 1 GRADE: 0 %
CHL CUP STRESS STAGE 1 HR: 63 {beats}/min
CHL CUP STRESS STAGE 1 SPEED: 0 mph
CHL CUP STRESS STAGE 2 GRADE: 0 %
CHL CUP STRESS STAGE 2 HR: 63 {beats}/min
CHL CUP STRESS STAGE 3 HR: 93 {beats}/min
CHL CUP STRESS STAGE 4 HR: 97 {beats}/min
CHL CUP STRESS STAGE 4 SPEED: 0 mph
CHL CUP STRESS STAGE 5 DBP: 66 mmHg
CHL CUP STRESS STAGE 5 GRADE: 0 %
CHL CUP STRESS STAGE 5 SBP: 162 mmHg
CHL CUP STRESS STAGE 5 SPEED: 0 mph
CSEPPHR: 93 {beats}/min
CSEPPMHR: 68 %
Estimated workload: 1 METS
Exercise duration (min): 1 min
Exercise duration (sec): 0 s
LV dias vol: 41 mL (ref 46–106)
LV sys vol: 20 mL
MPHR: 135 {beats}/min
Percent HR: 71 %
SDS: 1
Stage 2 Speed: 0 mph
Stage 3 Grade: 0 %
Stage 3 Speed: 0 mph
Stage 4 Grade: 0 %
Stage 5 HR: 90 {beats}/min
TID: 0.89

## 2016-03-08 DIAGNOSIS — C44622 Squamous cell carcinoma of skin of right upper limb, including shoulder: Secondary | ICD-10-CM | POA: Insufficient documentation

## 2016-04-21 ENCOUNTER — Encounter: Payer: Self-pay | Admitting: Emergency Medicine

## 2016-04-21 ENCOUNTER — Emergency Department: Payer: Medicare Other

## 2016-04-21 ENCOUNTER — Observation Stay
Admission: EM | Admit: 2016-04-21 | Discharge: 2016-04-23 | Disposition: A | Discharge status: Home / Self Care | Payer: Medicare Other | Attending: Internal Medicine | Admitting: Internal Medicine

## 2016-04-21 ENCOUNTER — Emergency Department
Admission: EM | Admit: 2016-04-21 | Discharge: 2016-04-21 | Disposition: A | Discharge status: Home / Self Care | Payer: Medicare Other | Attending: Emergency Medicine | Admitting: Emergency Medicine

## 2016-04-21 DIAGNOSIS — I10 Essential (primary) hypertension: Secondary | ICD-10-CM | POA: Insufficient documentation

## 2016-04-21 DIAGNOSIS — Z5181 Encounter for therapeutic drug level monitoring: Secondary | ICD-10-CM | POA: Insufficient documentation

## 2016-04-21 DIAGNOSIS — M25462 Effusion, left knee: Secondary | ICD-10-CM | POA: Insufficient documentation

## 2016-04-21 DIAGNOSIS — S8010XA Contusion of unspecified lower leg, initial encounter: Secondary | ICD-10-CM

## 2016-04-21 DIAGNOSIS — R52 Pain, unspecified: Secondary | ICD-10-CM

## 2016-04-21 DIAGNOSIS — I951 Orthostatic hypotension: Secondary | ICD-10-CM | POA: Diagnosis not present

## 2016-04-21 DIAGNOSIS — Z79899 Other long term (current) drug therapy: Secondary | ICD-10-CM | POA: Insufficient documentation

## 2016-04-21 DIAGNOSIS — Y999 Unspecified external cause status: Secondary | ICD-10-CM | POA: Diagnosis not present

## 2016-04-21 DIAGNOSIS — Z7982 Long term (current) use of aspirin: Secondary | ICD-10-CM | POA: Diagnosis not present

## 2016-04-21 DIAGNOSIS — R2681 Unsteadiness on feet: Secondary | ICD-10-CM

## 2016-04-21 DIAGNOSIS — M25561 Pain in right knee: Secondary | ICD-10-CM | POA: Insufficient documentation

## 2016-04-21 DIAGNOSIS — S81812A Laceration without foreign body, left lower leg, initial encounter: Secondary | ICD-10-CM | POA: Diagnosis not present

## 2016-04-21 DIAGNOSIS — Z23 Encounter for immunization: Secondary | ICD-10-CM | POA: Diagnosis not present

## 2016-04-21 DIAGNOSIS — Y9289 Other specified places as the place of occurrence of the external cause: Secondary | ICD-10-CM | POA: Insufficient documentation

## 2016-04-21 DIAGNOSIS — M6281 Muscle weakness (generalized): Secondary | ICD-10-CM

## 2016-04-21 DIAGNOSIS — J45909 Unspecified asthma, uncomplicated: Secondary | ICD-10-CM | POA: Insufficient documentation

## 2016-04-21 DIAGNOSIS — S81811A Laceration without foreign body, right lower leg, initial encounter: Secondary | ICD-10-CM | POA: Insufficient documentation

## 2016-04-21 DIAGNOSIS — Y939 Activity, unspecified: Secondary | ICD-10-CM | POA: Insufficient documentation

## 2016-04-21 DIAGNOSIS — S91312A Laceration without foreign body, left foot, initial encounter: Secondary | ICD-10-CM | POA: Diagnosis not present

## 2016-04-21 DIAGNOSIS — I6522 Occlusion and stenosis of left carotid artery: Secondary | ICD-10-CM | POA: Insufficient documentation

## 2016-04-21 DIAGNOSIS — S51011A Laceration without foreign body of right elbow, initial encounter: Secondary | ICD-10-CM | POA: Diagnosis not present

## 2016-04-21 DIAGNOSIS — R55 Syncope and collapse: Secondary | ICD-10-CM | POA: Diagnosis present

## 2016-04-21 DIAGNOSIS — T148XXA Other injury of unspecified body region, initial encounter: Secondary | ICD-10-CM

## 2016-04-21 DIAGNOSIS — T404X5A Adverse effect of other synthetic narcotics, initial encounter: Secondary | ICD-10-CM | POA: Diagnosis not present

## 2016-04-21 DIAGNOSIS — T1490XA Injury, unspecified, initial encounter: Secondary | ICD-10-CM

## 2016-04-21 DIAGNOSIS — S99922A Unspecified injury of left foot, initial encounter: Secondary | ICD-10-CM | POA: Diagnosis present

## 2016-04-21 DIAGNOSIS — I350 Nonrheumatic aortic (valve) stenosis: Secondary | ICD-10-CM | POA: Diagnosis not present

## 2016-04-21 DIAGNOSIS — Y92009 Unspecified place in unspecified non-institutional (private) residence as the place of occurrence of the external cause: Secondary | ICD-10-CM | POA: Insufficient documentation

## 2016-04-21 DIAGNOSIS — F419 Anxiety disorder, unspecified: Secondary | ICD-10-CM | POA: Diagnosis not present

## 2016-04-21 LAB — BASIC METABOLIC PANEL
ANION GAP: 10 (ref 5–15)
BUN: 16 mg/dL (ref 6–20)
CALCIUM: 9.5 mg/dL (ref 8.9–10.3)
CHLORIDE: 106 mmol/L (ref 101–111)
CO2: 23 mmol/L (ref 22–32)
CREATININE: 0.97 mg/dL (ref 0.44–1.00)
GFR calc non Af Amer: 52 mL/min — ABNORMAL LOW (ref >60)
GLUCOSE: 153 mg/dL — AB (ref 65–99)
Potassium: 3.8 mmol/L (ref 3.5–5.1)
Sodium: 139 mmol/L (ref 135–145)

## 2016-04-21 LAB — CBC
HCT: 32.6 % — ABNORMAL LOW (ref 35.0–47.0)
HEMATOCRIT: 36.5 % (ref 35.0–47.0)
HEMOGLOBIN: 12.5 g/dL (ref 12.0–16.0)
HEMOGLOBIN: 11.2 g/dL — AB (ref 12.0–16.0)
MCH: 32.4 pg (ref 26.0–34.0)
MCH: 32.4 pg (ref 26.0–34.0)
MCHC: 34.1 g/dL (ref 32.0–36.0)
MCHC: 34.3 g/dL (ref 32.0–36.0)
MCV: 94.9 fL (ref 80.0–100.0)
MCV: 94.4 fL (ref 80.0–100.0)
Platelets: 288 10*3/uL (ref 150–440)
Platelets: 223 10*3/uL (ref 150–440)
RBC: 3.85 MIL/uL (ref 3.80–5.20)
RBC: 3.45 MIL/uL — AB (ref 3.80–5.20)
RDW: 12.8 % (ref 11.5–14.5)
RDW: 12.6 % (ref 11.5–14.5)
WBC: 9.3 10*3/uL (ref 3.6–11.0)
WBC: 16.4 10*3/uL — ABNORMAL HIGH (ref 3.6–11.0)

## 2016-04-21 LAB — PROTIME-INR
INR: 0.98
PROTHROMBIN TIME: 13 s (ref 11.4–15.2)

## 2016-04-21 LAB — CK: Total CK: 67 U/L (ref 38–234)

## 2016-04-21 MED ORDER — SODIUM CHLORIDE 0.9 % IV BOLUS (SEPSIS)
1000.0000 mL | Freq: Once | INTRAVENOUS | Status: AC
  Administered 2016-04-21: 1000 mL via INTRAVENOUS

## 2016-04-21 MED ORDER — TETANUS-DIPHTH-ACELL PERTUSSIS 5-2.5-18.5 LF-MCG/0.5 IM SUSP
0.5000 mL | Freq: Once | INTRAMUSCULAR | Status: AC
  Administered 2016-04-21: 0.5 mL via INTRAMUSCULAR

## 2016-04-21 MED ORDER — HYDROCODONE-ACETAMINOPHEN 5-325 MG PO TABS: 1.0000 | ORAL_TABLET | ORAL | 0 refills | Status: DC | PRN

## 2016-04-21 MED ORDER — TETANUS-DIPHTHERIA TOXOIDS TD 5-2 LFU IM INJ
0.5000 mL | INJECTION | Freq: Once | INTRAMUSCULAR | Status: DC
  Filled 2016-04-21: qty 0.5

## 2016-04-21 MED ORDER — LIDOCAINE HCL (PF) 1 % IJ SOLN
INTRAMUSCULAR | Status: AC
  Filled 2016-04-21: qty 5

## 2016-04-21 MED ORDER — FENTANYL CITRATE (PF) 100 MCG/2ML IJ SOLN
INTRAMUSCULAR | Status: AC
  Administered 2016-04-21: 100 ug
  Filled 2016-04-21: qty 2

## 2016-04-21 NOTE — ED Notes (Signed)
Returned from [employer]. NAD. Pain is much better.

## 2016-04-21 NOTE — Discharge Instructions (Signed)
Please keep a close eye on your legs. If you develop increased pain, or fullness/tightness in your calf speech return immediately to the emergency department for further evaluation. Please take your pain medication as needed, as prescribed. Please follow-up with your doctor in the next 3-5 days for recheck/reevaluation. You have 4 sutures in the left foot which will need to be removed in approximately 7 days by her primary care doctor.  Please leave the dressings in place until seen by her primary care doctor early next week.

## 2016-04-21 NOTE — ED Notes (Signed)
Pt able to ambulate independently. Sister in law and brother will stay with pt tonight to make sure is ok.
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
## 2016-04-21 NOTE — ED Notes (Signed)
Pt in xray
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
## 2016-04-22 ENCOUNTER — Observation Stay: Payer: Medicare Other

## 2016-04-22 ENCOUNTER — Observation Stay
Admit: 2016-04-22 | Discharge: 2016-04-22 | Disposition: A | Discharge status: Home / Self Care | Payer: Medicare Other | Attending: Family Medicine | Admitting: Family Medicine

## 2016-04-22 DIAGNOSIS — T1490XA Injury, unspecified, initial encounter: Secondary | ICD-10-CM

## 2016-04-22 LAB — TSH: TSH: 2.454 u[IU]/mL (ref 0.350–4.500)

## 2016-04-22 LAB — ECHOCARDIOGRAM COMPLETE
AV Area mean vel: 1.16 cm2
AV area mean vel ind: 0.74 cm2/m2
AV peak Index: 0.86
AV vel: 1.5
AVA: 1.5 cm2
AVAREAVTI: 1.34 cm2
AVAREAVTIIND: 0.96 cm2/m2
AVCELMEANRAT: 0.41
AVG: 15 mmHg
AVLVOTPG: 6 mmHg
AVPG: 29 mmHg
AVPKVEL: 269 cm/s
Ao pk vel: 0.47 m/s
CHL CUP MV DEC (S): 183
DOP CAL AO MEAN VELOCITY: 186 cm/s
EERAT: 13.45
EWDT: 183 ms
FS: 38 % (ref 28–44)
Height: 60 in
IVS/LV PW RATIO, ED: 1.18
LA ID, A-P, ES: 40 mm
LA diam index: 2.56 cm/m2
LA vol A4C: 24.8 ml
LAVOL: 33.3 mL
LAVOLIN: 21.3 mL/m2
LEFT ATRIUM END SYS DIAM: 40 mm
LV E/e' medial: 13.45
LV PW d: 8.46 mm — AB (ref 0.6–1.1)
LV e' LATERAL: 5.77 cm/s
LVEEAVG: 13.45
LVOT VTI: 29.9 cm
LVOT area: 2.84 cm2
LVOT peak vel: 127 cm/s
LVOTD: 19 mm
LVOTSV: 85 mL
LVOTVTI: 0.53 cm
MV pk A vel: 119 m/s
MV pk E vel: 77.6 m/s
MVPG: 2 mmHg
P 1/2 time: 542 ms
RV LATERAL S' VELOCITY: 12.7 cm/s
TAPSE: 26 mm
TDI e' lateral: 5.77
TDI e' medial: 5.66
VTI: 56.8 cm
Valve area index: 0.96
Weight: 2009.6 oz

## 2016-04-22 LAB — LIPID PANEL
CHOLESTEROL: 157 mg/dL (ref 0–200)
HDL: 48 mg/dL (ref >40)
LDL Cholesterol: 97 mg/dL (ref 0–99)
TRIGLYCERIDES: 61 mg/dL (ref <150)
Total CHOL/HDL Ratio: 3.3 RATIO
VLDL: 12 mg/dL (ref 0–40)

## 2016-04-22 LAB — URINALYSIS COMPLETE WITH MICROSCOPIC (ARMC ONLY)
BILIRUBIN URINE: NEGATIVE
Glucose, UA: NEGATIVE mg/dL
Hgb urine dipstick: NEGATIVE
KETONES UR: NEGATIVE mg/dL
LEUKOCYTES UA: NEGATIVE
Nitrite: NEGATIVE
PH: 7 (ref 5.0–8.0)
Protein, ur: NEGATIVE mg/dL
Specific Gravity, Urine: 1.006 (ref 1.005–1.030)

## 2016-04-22 LAB — CBC
HEMATOCRIT: 25.2 % — AB (ref 35.0–47.0)
HEMOGLOBIN: 8.8 g/dL — AB (ref 12.0–16.0)
MCH: 33 pg (ref 26.0–34.0)
MCHC: 34.9 g/dL (ref 32.0–36.0)
MCV: 94.6 fL (ref 80.0–100.0)
Platelets: 212 10*3/uL (ref 150–440)
RBC: 2.66 MIL/uL — ABNORMAL LOW (ref 3.80–5.20)
RDW: 12.5 % (ref 11.5–14.5)
WBC: 9 10*3/uL (ref 3.6–11.0)

## 2016-04-22 LAB — BASIC METABOLIC PANEL
ANION GAP: 4 — AB (ref 5–15)
BUN: 14 mg/dL (ref 6–20)
CO2: 25 mmol/L (ref 22–32)
Calcium: 8.4 mg/dL — ABNORMAL LOW (ref 8.9–10.3)
Chloride: 112 mmol/L — ABNORMAL HIGH (ref 101–111)
Creatinine, Ser: 0.84 mg/dL (ref 0.44–1.00)
GFR calc Af Amer: >60 mL/min (ref >60)
GFR calc non Af Amer: >60 mL/min (ref >60)
GLUCOSE: 118 mg/dL — AB (ref 65–99)
POTASSIUM: 3.5 mmol/L (ref 3.5–5.1)
Sodium: 141 mmol/L (ref 135–145)

## 2016-04-22 LAB — GLUCOSE, CAPILLARY
GLUCOSE-CAPILLARY: 120 mg/dL — AB (ref 65–99)
Glucose-Capillary: 122 mg/dL — ABNORMAL HIGH (ref 65–99)
Glucose-Capillary: 76 mg/dL (ref 65–99)

## 2016-04-22 LAB — PHOSPHORUS: PHOSPHORUS: 3.4 mg/dL (ref 2.5–4.6)

## 2016-04-22 LAB — TROPONIN I
Troponin I: 0.05 ng/mL (ref <0.03)
Troponin I: 0.03 ng/mL (ref <0.03)

## 2016-04-22 LAB — MAGNESIUM: Magnesium: 1.8 mg/dL (ref 1.7–2.4)

## 2016-04-22 MED ORDER — ONDANSETRON HCL 4 MG/2ML IJ SOLN: 4.0000 mg | Freq: Four times a day (QID) | INTRAMUSCULAR | Status: DC | PRN

## 2016-04-22 MED ORDER — CALCIUM CARBONATE-VITAMIN D 500-200 MG-UNIT PO TABS
1.0000 | ORAL_TABLET | Freq: Every day | ORAL | Status: DC
  Administered 2016-04-22 – 2016-04-23 (×2): 1 via ORAL
  Filled 2016-04-22 (×2): qty 1

## 2016-04-22 MED ORDER — MAGNESIUM CITRATE PO SOLN: 1.0000 | Freq: Once | ORAL | Status: DC | PRN

## 2016-04-22 MED ORDER — ZOLPIDEM TARTRATE 5 MG PO TABS: 5.0000 mg | ORAL_TABLET | Freq: Every evening | ORAL | Status: DC | PRN

## 2016-04-22 MED ORDER — MONTELUKAST SODIUM 10 MG PO TABS
10.0000 mg | ORAL_TABLET | Freq: Every day | ORAL | Status: DC
  Administered 2016-04-22 – 2016-04-23 (×2): 10 mg via ORAL
  Filled 2016-04-22 (×2): qty 1

## 2016-04-22 MED ORDER — SENNOSIDES-DOCUSATE SODIUM 8.6-50 MG PO TABS: 1.0000 | ORAL_TABLET | Freq: Every evening | ORAL | Status: DC | PRN

## 2016-04-22 MED ORDER — VITAMIN C 500 MG PO TABS
1000.0000 mg | ORAL_TABLET | Freq: Every day | ORAL | Status: DC
  Administered 2016-04-22 – 2016-04-23 (×2): 1000 mg via ORAL
  Filled 2016-04-22 (×2): qty 2

## 2016-04-22 MED ORDER — HYDROCODONE-ACETAMINOPHEN 5-325 MG PO TABS
1.0000 | ORAL_TABLET | ORAL | Status: DC | PRN
  Administered 2016-04-22 – 2016-04-23 (×3): 1 via ORAL
  Filled 2016-04-22 (×3): qty 1

## 2016-04-22 MED ORDER — BISOPROLOL FUMARATE 10 MG PO TABS
20.0000 mg | ORAL_TABLET | Freq: Every day | ORAL | Status: DC
  Administered 2016-04-22 – 2016-04-23 (×2): 20 mg via ORAL
  Filled 2016-04-22 (×2): qty 2

## 2016-04-22 MED ORDER — ASPIRIN EC 81 MG PO TBEC
81.0000 mg | DELAYED_RELEASE_TABLET | Freq: Every day | ORAL | Status: DC
  Administered 2016-04-23: 81 mg via ORAL
  Filled 2016-04-22 (×2): qty 1

## 2016-04-22 MED ORDER — ENOXAPARIN SODIUM 40 MG/0.4ML ~~LOC~~ SOLN: 40.0000 mg | Freq: Every day | SUBCUTANEOUS | Status: DC

## 2016-04-22 MED ORDER — VITAMIN B-12 1000 MCG PO TABS
1000.0000 ug | ORAL_TABLET | Freq: Every day | ORAL | Status: DC
  Administered 2016-04-22 – 2016-04-23 (×2): 1000 ug via ORAL
  Filled 2016-04-22 (×2): qty 1

## 2016-04-22 MED ORDER — CLONIDINE HCL 0.1 MG PO TABS
0.1000 mg | ORAL_TABLET | Freq: Two times a day (BID) | ORAL | Status: DC
  Administered 2016-04-22 – 2016-04-23 (×3): 0.1 mg via ORAL
  Filled 2016-04-22 (×4): qty 1

## 2016-04-22 MED ORDER — ACETAMINOPHEN 650 MG RE SUPP: 650.0000 mg | Freq: Four times a day (QID) | RECTAL | Status: DC | PRN

## 2016-04-22 MED ORDER — IRBESARTAN 150 MG PO TABS
150.0000 mg | ORAL_TABLET | Freq: Two times a day (BID) | ORAL | Status: DC
  Administered 2016-04-22 – 2016-04-23 (×3): 150 mg via ORAL
  Filled 2016-04-22 (×3): qty 1

## 2016-04-22 MED ORDER — ONDANSETRON HCL 4 MG PO TABS: 4.0000 mg | ORAL_TABLET | Freq: Four times a day (QID) | ORAL | Status: DC | PRN

## 2016-04-22 MED ORDER — SODIUM CHLORIDE 0.9% FLUSH
3.0000 mL | Freq: Two times a day (BID) | INTRAVENOUS | Status: DC
  Administered 2016-04-22 (×2): 3 mL via INTRAVENOUS

## 2016-04-22 MED ORDER — ACETAMINOPHEN 325 MG PO TABS
650.0000 mg | ORAL_TABLET | Freq: Four times a day (QID) | ORAL | Status: DC | PRN
  Administered 2016-04-22: 650 mg via ORAL
  Filled 2016-04-22: qty 2

## 2016-04-22 MED ORDER — ALUM & MAG HYDROXIDE-SIMETH 200-200-20 MG/5ML PO SUSP: 30.0000 mL | Freq: Four times a day (QID) | ORAL | Status: DC | PRN

## 2016-04-22 MED ORDER — CLONAZEPAM 1 MG PO TABS
1.0000 mg | ORAL_TABLET | Freq: Every day | ORAL | Status: DC
  Administered 2016-04-22: 1 mg via ORAL
  Filled 2016-04-22: qty 1

## 2016-04-22 MED ORDER — ADULT MULTIVITAMIN W/MINERALS CH
1.0000 | ORAL_TABLET | Freq: Every day | ORAL | Status: DC
  Administered 2016-04-22 – 2016-04-23 (×2): 1 via ORAL
  Filled 2016-04-22 (×3): qty 1

## 2016-04-22 MED ORDER — ESTRADIOL 1 MG PO TABS
0.5000 mg | ORAL_TABLET | Freq: Every day | ORAL | Status: DC
  Administered 2016-04-22 – 2016-04-23 (×2): 0.5 mg via ORAL
  Filled 2016-04-22: qty 0.5
  Filled 2016-04-22: qty 1

## 2016-04-22 MED ORDER — SODIUM CHLORIDE 0.9 % IV SOLN
INTRAVENOUS | Status: DC
  Administered 2016-04-22 – 2016-04-23 (×3): via INTRAVENOUS

## 2016-04-22 MED ORDER — ALBUTEROL SULFATE (2.5 MG/3ML) 0.083% IN NEBU: 3.0000 mL | INHALATION_SOLUTION | RESPIRATORY_TRACT | Status: DC | PRN

## 2016-04-22 MED ORDER — HYDROMORPHONE HCL 1 MG/ML IJ SOLN
0.5000 mg | Freq: Once | INTRAMUSCULAR | Status: AC
  Administered 2016-04-22: 0.5 mg via INTRAVENOUS
  Filled 2016-04-22: qty 1

## 2016-04-22 MED ORDER — BISACODYL 5 MG PO TBEC
5.0000 mg | DELAYED_RELEASE_TABLET | Freq: Every day | ORAL | Status: DC | PRN
  Filled 2016-04-22: qty 1

## 2016-04-22 MED ORDER — VITAMIN D 1000 UNITS PO TABS
1000.0000 [IU] | ORAL_TABLET | Freq: Every day | ORAL | Status: DC
  Administered 2016-04-22 – 2016-04-23 (×2): 1000 [IU] via ORAL
  Filled 2016-04-22 (×2): qty 1
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
## 2016-04-22 NOTE — Progress Notes (Signed)
Patient to transport to [place] where dressing will need to be removed. New orders for dressing and wound care placed by Dr. Burt Knack. Will make oncoming RN made aware of orders and the need to place new dressing when patient returns from [place]. Materials at bedside. Madlyn Frankel, RN
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
## 2016-04-23 LAB — GLUCOSE, CAPILLARY: GLUCOSE-CAPILLARY: 80 mg/dL (ref 65–99)

## 2016-04-23 LAB — COMPREHENSIVE METABOLIC PANEL
ALK PHOS: 75 U/L (ref 38–126)
ALT: 14 U/L (ref 14–54)
AST: 21 U/L (ref 15–41)
Albumin: 4.2 g/dL (ref 3.5–5.0)
Anion gap: 10 (ref 5–15)
BILIRUBIN TOTAL: 0.4 mg/dL (ref 0.3–1.2)
BUN: 14 mg/dL (ref 6–20)
CALCIUM: 10.1 mg/dL (ref 8.9–10.3)
CHLORIDE: 104 mmol/L (ref 101–111)
CO2: 25 mmol/L (ref 22–32)
CREATININE: 0.88 mg/dL (ref 0.44–1.00)
GFR, EST NON AFRICAN AMERICAN: 58 mL/min — AB (ref >60)
Glucose, Bld: 101 mg/dL — ABNORMAL HIGH (ref 65–99)
Potassium: 3.4 mmol/L — ABNORMAL LOW (ref 3.5–5.1)
Sodium: 139 mmol/L (ref 135–145)
Total Protein: 7 g/dL (ref 6.5–8.1)
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
## 2016-04-23 NOTE — Progress Notes (Signed)
MD order received in [facility] to discharge pt home with home health today; Care Management previously established [facility], RN and aide with [facility]; verbally reviewed AVS with pt no questions voiced; pt discharged via wheelchair by nursing to the visitor's entrance

## 2016-04-23 NOTE — Care Management Note (Addendum)
Case Management Note  Patient Details  Name: [name] MRN: [id number] Date of Birth: 1929/05/11  Subjective/Objective:     Discussed discharge planning with Ms Phillip's daughter Claire Shown [phone], who chose [facility]. A referral was faxed to [facility] requesting HH=PT, RN, Aide. Note to call Ms Richberg if there will be co-pays. Ms Knapper already has a RW at home.                Action/Plan:   Expected Discharge Date:                  Expected Discharge Plan:     In-House Referral:     Discharge planning Services     Post Acute Care Choice:    Choice offered to:     DME Arranged:    DME Agency:     HH Arranged:    HH Agency:     Status of Service:     If discussed at [employer] of Stay Meetings, dates discussed:    Additional Comments:  Wandalee Klang A, RN 04/23/2016, 11:15 AM

## 2016-04-23 NOTE — Care Management Obs Status (Signed)
[facility] NOTIFICATION   Patient Details  Name: [name] MRN: [id number] Date of Birth: [birth year]   Medicare Observation Status Notification Given:  Yes    Ival Bible, RN 04/23/2016, 2:11 PM

## 2016-04-23 NOTE — Discharge Instructions (Signed)
[facility], RN and Aide.

## 2016-04-24 DIAGNOSIS — S8990XA Unspecified injury of unspecified lower leg, initial encounter: Secondary | ICD-10-CM

## 2016-04-24 HISTORY — DX: Unspecified injury of unspecified lower leg, initial encounter: S89.90XA

## 2016-04-25 ENCOUNTER — Telehealth: Payer: Self-pay

## 2016-04-25 NOTE — Telephone Encounter (Signed)
Patients daughter called stating that her mother ran out of adaptec dressing to bandage the wound. She won't be getting some more in until tomorrow. I talked to [employer], she said it is okay to use vaseline gauze in place of the adaptec dressing until she gets some more in tomorrow. We don't have any of them in the office, but Dellis Filbert is coming in to pick up the vaseline gauze today.

## 2016-04-27 ENCOUNTER — Ambulatory Visit: Payer: Medicare Other | Admitting: Surgery

## 2016-04-27 ENCOUNTER — Ambulatory Visit (INDEPENDENT_AMBULATORY_CARE_PROVIDER_SITE_OTHER): Payer: Medicare Other | Admitting: Surgery

## 2016-04-27 VITALS — BP 156/72 | HR 84 | Temp 98.2°F | Ht 61.0 in | Wt 118.0 lb

## 2016-04-27 DIAGNOSIS — S81809S Unspecified open wound, unspecified lower leg, sequela: Secondary | ICD-10-CM

## 2016-04-27 MED ORDER — SILVER SULFADIAZINE 1 % EX CREA: TOPICAL_CREAM | CUTANEOUS | 1 refills | Status: AC

## 2016-04-27 MED ORDER — CEPHALEXIN 500 MG PO CAPS: 500.0000 mg | ORAL_CAPSULE | Freq: Four times a day (QID) | ORAL | 0 refills | Status: DC

## 2016-04-27 MED ORDER — CEPHALEXIN 500 MG PO CAPS: 500.0000 mg | ORAL_CAPSULE | Freq: Four times a day (QID) | ORAL | 1 refills | Status: DC

## 2016-04-27 NOTE — Patient Instructions (Addendum)
We have removed your sutures today. Please apply silvadene cream to affected areas on your leg and top of left foot twice daily.Please change bandages twice daily.  We have sent your Antibiotic to your pharmacy. Please keep both feet elevated as much as possible. Please see your follow up appointment listed below. Please call our office if you have questions or concerns. We will send the referral to the wound care facility and someone will be contacting you to make an appointment. If you have not heard from anyone in a few days please call and make [place] aware.

## 2016-04-27 NOTE — Addendum Note (Signed)
Addended by: Celene Kras on: 04/27/2016 02:51 PM   Modules accepted: Orders
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
## 2016-05-02 ENCOUNTER — Other Ambulatory Visit: Payer: Self-pay

## 2016-05-02 ENCOUNTER — Ambulatory Visit (INDEPENDENT_AMBULATORY_CARE_PROVIDER_SITE_OTHER): Payer: Medicare Other | Admitting: General Surgery

## 2016-05-02 ENCOUNTER — Encounter: Payer: Self-pay | Admitting: General Surgery

## 2016-05-02 ENCOUNTER — Ambulatory Visit: Payer: Self-pay | Admitting: General Surgery

## 2016-05-02 VITALS — BP 130/57 | HR 76 | Temp 97.6°F | Ht 61.0 in | Wt 119.0 lb

## 2016-05-02 DIAGNOSIS — L089 Local infection of the skin and subcutaneous tissue, unspecified: Secondary | ICD-10-CM | POA: Diagnosis not present

## 2016-05-02 DIAGNOSIS — Z8719 Personal history of other diseases of the digestive system: Secondary | ICD-10-CM | POA: Insufficient documentation

## 2016-05-02 DIAGNOSIS — I739 Peripheral vascular disease, unspecified: Secondary | ICD-10-CM | POA: Insufficient documentation

## 2016-05-02 DIAGNOSIS — F411 Generalized anxiety disorder: Secondary | ICD-10-CM | POA: Insufficient documentation

## 2016-05-02 DIAGNOSIS — K589 Irritable bowel syndrome without diarrhea: Secondary | ICD-10-CM | POA: Insufficient documentation

## 2016-05-02 DIAGNOSIS — K5790 Diverticulosis of intestine, part unspecified, without perforation or abscess without bleeding: Secondary | ICD-10-CM | POA: Insufficient documentation

## 2016-05-02 DIAGNOSIS — I351 Nonrheumatic aortic (valve) insufficiency: Secondary | ICD-10-CM | POA: Insufficient documentation

## 2016-05-02 MED ORDER — HYDROCODONE-ACETAMINOPHEN 5-325 MG PO TABS: 1.0000 | ORAL_TABLET | ORAL | 0 refills | Status: DC | PRN

## 2016-05-02 NOTE — Patient Instructions (Signed)
Please follow up with your appointment to Dr.Britt with 05/04/16 at 2:15 pm. Please continue to apply the Silvadene cream twice daily and change dressing twice daily. Please call our office if you have any questions or concerns.
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
## 2016-05-04 ENCOUNTER — Encounter: Payer: Medicare Other | Attending: Surgery | Admitting: Surgery

## 2016-05-04 DIAGNOSIS — L97312 Non-pressure chronic ulcer of right ankle with fat layer exposed: Secondary | ICD-10-CM | POA: Insufficient documentation

## 2016-05-04 DIAGNOSIS — S81811A Laceration without foreign body, right lower leg, initial encounter: Secondary | ICD-10-CM | POA: Diagnosis not present

## 2016-05-04 DIAGNOSIS — C44722 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, including hip: Secondary | ICD-10-CM | POA: Insufficient documentation

## 2016-05-04 DIAGNOSIS — Z880 Allergy status to penicillin: Secondary | ICD-10-CM | POA: Insufficient documentation

## 2016-05-04 DIAGNOSIS — S51011A Laceration without foreign body of right elbow, initial encounter: Secondary | ICD-10-CM | POA: Diagnosis not present

## 2016-05-04 DIAGNOSIS — I351 Nonrheumatic aortic (valve) insufficiency: Secondary | ICD-10-CM | POA: Insufficient documentation

## 2016-05-04 DIAGNOSIS — I739 Peripheral vascular disease, unspecified: Secondary | ICD-10-CM | POA: Insufficient documentation

## 2016-05-04 DIAGNOSIS — C44729 Squamous cell carcinoma of skin of left lower limb, including hip: Secondary | ICD-10-CM | POA: Insufficient documentation

## 2016-05-04 DIAGNOSIS — I10 Essential (primary) hypertension: Secondary | ICD-10-CM | POA: Diagnosis not present

## 2016-05-04 DIAGNOSIS — F411 Generalized anxiety disorder: Secondary | ICD-10-CM | POA: Diagnosis not present

## 2016-05-04 DIAGNOSIS — Z7982 Long term (current) use of aspirin: Secondary | ICD-10-CM | POA: Insufficient documentation

## 2016-05-04 DIAGNOSIS — S81812A Laceration without foreign body, left lower leg, initial encounter: Secondary | ICD-10-CM | POA: Insufficient documentation

## 2016-05-04 DIAGNOSIS — J45909 Unspecified asthma, uncomplicated: Secondary | ICD-10-CM | POA: Diagnosis not present

## 2016-05-04 DIAGNOSIS — L97522 Non-pressure chronic ulcer of other part of left foot with fat layer exposed: Secondary | ICD-10-CM | POA: Diagnosis not present

## 2016-05-04 DIAGNOSIS — Z79899 Other long term (current) drug therapy: Secondary | ICD-10-CM | POA: Diagnosis not present
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
## 2016-05-05 NOTE — Progress Notes (Signed)
[name] ([id number]) Visit Report for 05/04/2016 Abuse/Suicide Risk Screen Details Patient Name: [name], [name] Date of Service: 05/04/2016 2:15 PM Medical Record Number: [id number] Patient Account Number: [id number] Date of Birth/Sex: [birth year] (82 y.o. Female) Treating RN: [name] Primary Care Physician: [name] Other Clinician: Referring Physician: Phoebe [name] Treating Physician/Extender: [name] in Treatment: 0 Abuse/Suicide Risk Screen Items Answer ABUSE/SUICIDE RISK SCREEN: Has anyone close to you tried to hurt or harm you recentlyo No Do you feel uncomfortable with anyone in your familyo No Has anyone forced you do things that you didnot want to doo No Do you have any thoughts of harming yourselfo No Patient displays signs or symptoms of abuse and/or neglect. No Electronic Signature(s) Signed: 05/04/2016 5:03:06 PM By: [name] Entered By: [name] on 05/04/2016 14:51:07 [name] ([id number]) -------------------------------------------------------------------------------- Activities of Daily Living Details Patient Name: [name] Date of Service: 05/04/2016 2:15 PM Medical Record Number: [id number] Patient Account Number: [id number] Date of Birth/Sex: [birth year] (82 y.o. Female) Treating RN: [name] Primary Care Physician: [name] Other Clinician: Referring Physician: Phoebe [name] Treating Physician/Extender: [name] in Treatment: 0 Activities of Daily Living Items Answer Activities of Daily Living (Please select one for each item) Drive Automobile Not Able Take Medications Completely Able Use Telephone Completely Able Care for Appearance Completely Able Use Toilet Completely Able Bath / Shower Need Assistance Dress Self Need Assistance Feed Self Completely Able Walk Need Assistance Get In / Out Bed Completely [name] for Self Need Assistance Electronic Signature(s) Signed: 05/04/2016 5:03:06 PM By: [name] Entered By: [name] on 05/04/2016 14:51:40 [name] ([id number]) -------------------------------------------------------------------------------- Education Assessment Details Patient Name: [name] Date of Service: 05/04/2016 2:15 PM Medical Record Number: [id number] Patient Account Number: [id number] Date of Birth/Sex: [birth year] (82 y.o. Female) Treating RN: [name] Primary Care Physician: [name] Other Clinician: Referring Physician: Phoebe [name] Treating Physician/Extender: [name] in Treatment: 0 Primary Learner Assessed: Caregiver Reason Patient is not Primary Learner: wound location Learning Preferences/Education Level/Primary Language Learning Preference: Explanation, Demonstration Highest Education Level: College or Above Preferred Language: English Cognitive Barrier Assessment/Beliefs Language Barrier: No Translator Needed: No Memory Deficit: No Emotional Barrier: No Cultural/Religious Beliefs Affecting Medical No Care: Physical Barrier Assessment Impaired Vision: No Impaired Hearing: No Decreased Hand dexterity: No Knowledge/Comprehension Assessment Knowledge Level: Medium Comprehension Level: Medium Ability to understand written Medium instructions: Ability to understand verbal Medium instructions: Motivation Assessment Anxiety Level: Calm Cooperation: Cooperative Education Importance: Acknowledges Need Interest in Health Problems: Asks Questions Perception: Coherent Willingness to Engage in Self- Medium Management Activities: Readiness to Engage in Self- Medium Management Activities: [name] ([id number]) Electronic Signature(s) Signed: 05/04/2016 5:03:06 PM By: [name] Entered By: [name] on 05/04/2016 14:52:13 [name]  ([id number]) -------------------------------------------------------------------------------- Fall Risk Assessment Details Patient Name: [name] Date of Service: 05/04/2016 2:15 PM Medical Record Number: [id number] Patient Account Number: [id number] Date of Birth/Sex: 01-09-1929 (82 y.o. Female) Treating RN: [name] Primary Care Physician: [name] Other Clinician: Referring Physician: Phoebe [name] Treating Physician/Extender: [name] in Treatment: 0 Fall Risk Assessment Items Have you had 2 or more falls in the last 12 monthso 0 No Have you had any fall that resulted in injury in the last 12 monthso 0 No FALL RISK ASSESSMENT: History of falling - immediate or within 3 months 0 No Secondary diagnosis 0 No Ambulatory aid None/bed rest/wheelchair/nurse 0 No Crutches/cane/walker 15 Yes Furniture 0  No IV Access/Saline Lock 0 No Gait/Training Normal/bed rest/immobile 0 No Weak 10 Yes Impaired 0 No Mental Status Oriented to own ability 0 Yes Electronic Signature(s) Signed: 05/04/2016 5:03:06 PM By: [name] Entered By: [name] on 05/04/2016 14:52:23 [name] ([id number]) -------------------------------------------------------------------------------- Foot Assessment Details Patient Name: [name] Date of Service: 05/04/2016 2:15 PM Medical Record Number: [id number] Patient Account Number: [id number] Date of Birth/Sex: 12-05-1928 (82 y.o. Female) Treating RN: [name] Primary Care Physician: [name] Other Clinician: Referring Physician: Phoebe [name] Treating Physician/Extender: [name] in Treatment: 0 Foot Assessment Items Site Locations + = Sensation present, - = Sensation absent, C = Callus, U = Ulcer R = Redness, W = Warmth, M = Maceration, PU = Pre-ulcerative lesion F = Fissure, S = Swelling, D = Dryness Assessment Right: Left: Other Deformity: No No Prior Foot Ulcer: No No Prior  Amputation: No No Charcot Joint: No No Ambulatory Status: Ambulatory With Help Assistance Device: Walker Gait: Steady Electronic Signature(s) Signed: 05/04/2016 5:03:06 PM By: [name] Entered By: [name] on 05/04/2016 14:53:49 [name], [name] ([id number]) -------------------------------------------------------------------------------- Nutrition Risk Assessment Details Patient Name: [name] Date of Service: 05/04/2016 2:15 PM Medical Record Number: [id number] Patient Account Number: [id number] Date of Birth/Sex: 1928/11/12 (82 y.o. Female) Treating RN: [name] Primary Care Physician: [name] Other Clinician: Referring Physician: Phoebe [name] Treating Physician/Extender: [name] in Treatment: 0 Height (in): 60 Weight (lbs): 122 Body Mass Index (BMI): 23.8 Nutrition Risk Assessment Items NUTRITION RISK SCREEN: I have an illness or condition that made me change the kind and/or 0 No amount of food I eat I eat fewer than two meals per day 0 No I eat few fruits and vegetables, or milk products 0 No I have three or more drinks of beer, liquor or wine almost every day 0 No I have tooth or mouth problems that make it hard for me to eat 0 No I don't always have enough money to buy the food I need 0 No I eat alone most of the time 0 No I take three or more different prescribed or over-the-counter drugs a 1 Yes day Without wanting to, I have lost or gained 10 pounds in the last six 0 No months I am not always physically able to shop, cook and/or feed myself 0 No Nutrition Protocols Good Risk Protocol 0 No interventions needed Moderate Risk Protocol Electronic Signature(s) Signed: 05/04/2016 5:03:06 PM By: [name] Entered By: [name] on 05/04/2016 14:52:31

## 2016-05-05 NOTE — Progress Notes (Signed)
RYANNAH, BROOMELL ([id number]) Visit Report for 05/04/2016 Allergy List Details Patient Name: [name], [name] Date of Service: 05/04/2016 2:15 PM Medical Record Number: [id number] Patient Account Number: [id number] Date of Birth/Sex: September 04, 1928 (82 y.o. Female) Treating RN: Montey Hora Primary Care Physician: Clayborn Bigness Other Clinician: Referring Physician: Phoebe Perch Treating Physician/Extender: Frann Rider in Treatment: 0 Allergies Active Allergies penicillin bacitracin Allergy Notes Electronic Signature(s) Signed: 05/04/2016 5:03:06 PM By: Montey Hora Entered By: Montey Hora on 05/04/2016 14:37:29 [name], [name] ([id number]) -------------------------------------------------------------------------------- Arrival Information Details Patient Name: [name] Date of Service: 05/04/2016 2:15 PM Medical Record Number: [id number] Patient Account Number: [id number] Date of Birth/Sex: Aug 27, 1928 (82 y.o. Female) Treating RN: Montey Hora Primary Care Physician: Clayborn Bigness Other Clinician: Referring Physician: Phoebe Perch Treating Physician/Extender: Frann Rider in Treatment: 0 Visit Information Patient Arrived: Walker Arrival Time: 14:25 Accompanied By: dtr Transfer Assistance: None Patient Identification Verified: Yes Secondary Verification Process Completed: Yes Electronic Signature(s) Signed: 05/04/2016 5:03:06 PM By: Montey Hora Entered By: Montey Hora on 05/04/2016 14:32:17 [name] ([id number]) -------------------------------------------------------------------------------- Clinic Level of Care Assessment Details Patient Name: [name] Date of Service: 05/04/2016 2:15 PM Medical Record Number: [id number] Patient Account Number: [id number] Date of Birth/Sex: 01/20/1929 (82 y.o. Female) Treating RN: Montey Hora Primary Care Physician: Clayborn Bigness Other Clinician: Referring Physician: Phoebe Perch Treating Physician/Extender: Frann Rider in Treatment: 0 Clinic Level of Care Assessment Items TOOL 2 Quantity Score []  - Use when only an EandM is performed on the INITIAL visit 0 ASSESSMENTS - Nursing Assessment / Reassessment X - General Physical Exam (combine w/ comprehensive assessment (listed just 1 20 below) when performed on new pt. evals) X - Comprehensive Assessment (HX, ROS, Risk Assessments, Wounds Hx, etc.) 1 25 ASSESSMENTS - Wound and Skin Assessment / Reassessment []  - Simple Wound Assessment / Reassessment - one wound 0 X - Complex Wound Assessment / Reassessment - multiple wounds 3 5 []  - Dermatologic / Skin Assessment (not related to wound area) 0 ASSESSMENTS - Ostomy and/or Continence Assessment and Care []  - Incontinence Assessment and Management 0 []  - Ostomy Care Assessment and Management (repouching, etc.) 0 PROCESS - Coordination of Care X - Simple Patient / Family Education for ongoing care 1 15 []  - Complex (extensive) Patient / Family Education for ongoing care 0 []  - Staff obtains Programmer, systems, Records, Test Results / Process Orders 0 []  - Staff telephones HHA, Nursing Homes / Clarify orders / etc 0 []  - Routine Transfer to another Facility (non-emergent condition) 0 []  - Routine Hospital Admission (non-emergent condition) 0 X - New Admissions / Biomedical engineer / Ordering NPWT, Apligraf, etc. 1 15 []  - Emergency Hospital Admission (emergent condition) 0 X - Simple Discharge Coordination 1 10 [name], [name] ([id number]) []  - Complex (extensive) Discharge Coordination 0 PROCESS - Special Needs []  - Pediatric / Minor Patient Management 0 []  - Isolation Patient Management 0 []  - Hearing / Language / Visual special needs 0 []  - Assessment of Community assistance (transportation, D/C planning, etc.) 0 []  - Additional assistance / Altered mentation 0 []  - Support Surface(s) Assessment (bed, cushion, seat, etc.) 0 INTERVENTIONS - Wound  Cleansing / Measurement X - Wound Imaging (photographs - any number of wounds) 1 5 []  - Wound Tracing (instead of photographs) 0 []  - Simple Wound Measurement - one wound 0 X - Complex Wound Measurement - multiple wounds 3 5 []  - Simple Wound Cleansing - one wound 0 X - Complex Wound Cleansing - multiple wounds 3 5 INTERVENTIONS -  Wound Dressings []  - Small Wound Dressing one or multiple wounds 0 X - Medium Wound Dressing one or multiple wounds 3 15 []  - Large Wound Dressing one or multiple wounds 0 []  - Application of Medications - injection 0 INTERVENTIONS - Miscellaneous []  - External ear exam 0 []  - Specimen Collection (cultures, biopsies, blood, body fluids, etc.) 0 []  - Specimen(s) / Culture(s) sent or taken to Lab for analysis 0 []  - Patient Transfer (multiple staff / Harrel Lemon Lift / Similar devices) 0 []  - Simple Staple / Suture removal (25 or less) 0 []  - Complex Staple / Suture removal (26 or more) 0 [name], [name] ([id number]) []  - Hypo / Hyperglycemic Management (close monitor of Blood Glucose) 0 []  - Ankle / Brachial Index (ABI) - do not check if billed separately 0 Has the patient been seen at the hospital within the last three years: Yes Total Score: 180 Level Of Care: New/Established - Level 5 Electronic Signature(s) Signed: 05/04/2016 5:03:06 PM By: Montey Hora Entered By: Montey Hora on 05/04/2016 15:43:00 [name] ([id number]) -------------------------------------------------------------------------------- Encounter Discharge Information Details Patient Name: [name] Date of Service: 05/04/2016 2:15 PM Medical Record Number: [id number] Patient Account Number: [id number] Date of Birth/Sex: Oct 21, 1928 (82 y.o. Female) Treating RN: Montey Hora Primary Care Physician: Clayborn Bigness Other Clinician: Referring Physician: Phoebe Perch Treating Physician/Extender: Frann Rider in Treatment: 0 Encounter Discharge Information  Items Discharge Pain Level: 0 Discharge Condition: Stable Ambulatory Status: Walker Discharge Destination: Home Transportation: Private Auto Accompanied By: dtr Schedule Follow-up Appointment: Yes Medication Reconciliation completed No and provided to Patient/Care Tylicia Sherman: Provided on Clinical Summary of Care: 05/04/2016 Form Type Recipient Paper Patient LP Electronic Signature(s) Signed: 05/04/2016 4:05:58 PM By: Ruthine Dose Previous Signature: 05/04/2016 3:23:15 PM Version By: Montey Hora Entered By: Ruthine Dose on 05/04/2016 16:05:58 [name] ([id number]) -------------------------------------------------------------------------------- Lower Extremity Assessment Details Patient Name: [name] Date of Service: 05/04/2016 2:15 PM Medical Record Number: [id number] Patient Account Number: [id number] Date of Birth/Sex: [birth year] (82 y.o. Female) Treating RN: Montey Hora Primary Care Physician: Clayborn Bigness Other Clinician: Referring Physician: Phoebe Perch Treating Physician/Extender: Frann Rider in Treatment: 0 Edema Assessment Assessed: [Left: No] [Right: No] Edema: [Left: No] [Right: Yes] Calf Left: Right: Point of Measurement: 30 cm From Medial Instep 34.5 cm 31.4 cm Ankle Left: Right: Point of Measurement: 10 cm From Medial Instep 21 cm 19.4 cm Vascular Assessment Pulses: Posterior Tibial Palpable: [Left:Yes] [Right:Yes] Doppler: [Left:Multiphasic] [Right:Multiphasic] Dorsalis Pedis Palpable: [Left:Yes] [Right:Yes] Doppler: [Left:Multiphasic] [Right:Multiphasic] Extremity colors, hair growth, and conditions: Extremity Color: [Left:Mottled] [Right:Red] Hair Growth on Extremity: [Left:No] [Right:No] Temperature of Extremity: [Left:Warm] [Right:Warm] Capillary Refill: [Left:< 3 seconds] [Right:< 3 seconds] Toe Nail Assessment Left: Right: Thick: No No Discolored: No No Deformed: No No Improper Length and Hygiene: No  No Electronic Signature(s) Signed: 05/04/2016 5:03:06 PM By: Peggyann Juba, [name] ([id number]) Entered By: Montey Hora on 05/04/2016 15:10:43 [name], [name] ([id number]) -------------------------------------------------------------------------------- Multi Wound Chart Details Patient Name: [name] Date of Service: 05/04/2016 2:15 PM Medical Record Number: [id number] Patient Account Number: [id number] Date of Birth/Sex: [birth year] (82 y.o. Female) Treating RN: Montey Hora Primary Care Physician: Clayborn Bigness Other Clinician: Referring Physician: Phoebe Perch Treating Physician/Extender: Frann Rider in Treatment: 0 Vital Signs Height(in): 60 Pulse(bpm): 76 Weight(lbs): 122 Blood Pressure 150/58 (mmHg): Body Mass Index(BMI): 24 Temperature(F): 97.5 Respiratory Rate 18 (breaths/min): Photos: [1:No Photos] [2:No Photos] [3:No Photos] Wound Location: [1:Right Lower Leg] [2:Left Foot - Dorsal] [3:Right Elbow] Wounding Event: [1:Trauma] [2:Trauma] [3:Trauma] Primary Etiology: [1:Trauma,  Other] [2:Trauma, Other] [3:Trauma, Other] Comorbid History: [1:Asthma, Hypertension] [2:Asthma, Hypertension] [3:Asthma, Hypertension] Date Acquired: [1:04/21/2016] [2:04/21/2016] [3:04/21/2016] Weeks of Treatment: [1:0] [2:0] [3:0] Wound Status: [1:Open] [2:Open] [3:Open] Clustered Wound: [1:Yes] [2:No] [3:No] Pending Amputation on Yes [2:No] [3:No] Presentation: Measurements L x W x D 20.3x12.4x0.2 [2:3x0.5x0.1] [3:1.7x1.1x0.1] (cm) Area (cm) : [1:197.7] [2:1.178] [3:1.469] Volume (cm) : [1:39.54] [2:0.118] [3:0.147] Classification: [1:Full Thickness Without Exposed Support Structures] [2:Partial Thickness] [3:Partial Thickness] Exudate Amount: [1:Large] [2:Large] [3:Large] Exudate Type: [1:Purulent] [2:Serous] [3:Serous] Exudate Color: [1:yellow, brown, green] [2:amber] [3:amber] Wound Margin: [1:Flat and Intact] [2:Flat and Intact] [3:Flat and  Intact] Granulation Amount: [1:None Present (0%)] [2:None Present (0%)] [3:None Present (0%)] Necrotic Amount: [1:Large (67-100%)] [2:Large (67-100%)] [3:Large (67-100%)] Necrotic Tissue: [1:Eschar, Adherent Slough] [2:Eschar, Adherent Slough] [3:Eschar, Adherent Slough] Exposed Structures: [1:Fascia: No Fat: No Tendon: No Muscle: No Joint: No] [2:Fascia: No Fat: No Tendon: No Muscle: No Joint: No] [3:Fascia: No Fat: No Tendon: No Muscle: No Joint: No] Bone: No Bone: No Bone: No Limited to Skin Limited to Skin Limited to Skin Breakdown Breakdown Breakdown Epithelialization: Small (1-33%) None None Periwound Skin Texture: Edema: Yes Edema: No Edema: Yes Excoriation: No Excoriation: No Excoriation: No Induration: No Induration: No Induration: No Callus: No Callus: No Callus: No Crepitus: No Crepitus: No Crepitus: No Fluctuance: No Fluctuance: No Fluctuance: No Friable: No Friable: No Friable: No Rash: No Rash: No Rash: No Scarring: No Scarring: No Scarring: No Periwound Skin Maceration: Yes Maceration: No Maceration: No Moisture: Moist: Yes Moist: No Moist: No Dry/Scaly: No Dry/Scaly: No Dry/Scaly: No Periwound Skin Color: Erythema: Yes Erythema: Yes Erythema: Yes Atrophie Blanche: No Atrophie Blanche: No Atrophie Blanche: No Cyanosis: No Cyanosis: No Cyanosis: No Ecchymosis: No Ecchymosis: No Ecchymosis: No Hemosiderin Staining: No Hemosiderin Staining: No Hemosiderin Staining: No Mottled: No Mottled: No Mottled: No Pallor: No Pallor: No Pallor: No Rubor: No Rubor: No Rubor: No Erythema Location: Circumferential Circumferential Circumferential Temperature: No Abnormality No Abnormality No Abnormality Tenderness on Yes Yes Yes Palpation: Wound Preparation: Ulcer Cleansing: Ulcer Cleansing: Ulcer Cleansing: Rinsed/Irrigated with Rinsed/Irrigated with Rinsed/Irrigated with Saline Saline Saline Topical Anesthetic Topical Anesthetic Topical  Anesthetic Applied: Other: lidocaine Applied: Other: lidocaine Applied: Other: lidocaine 4% 4% 4% Treatment Notes Electronic Signature(s) Signed: 05/04/2016 3:22:05 PM By: Montey Hora Entered By: Montey Hora on 05/04/2016 15:22:05 [name], [name] ([id number]) -------------------------------------------------------------------------------- Multi-Disciplinary Care Plan Details Patient Name: [name] Date of Service: 05/04/2016 2:15 PM Medical Record Number: [id number] Patient Account Number: [id number] Date of Birth/Sex: Feb 11, 1929 (82 y.o. Female) Treating RN: Montey Hora Primary Care Physician: Clayborn Bigness Other Clinician: Referring Physician: Phoebe Perch Treating Physician/Extender: Frann Rider in Treatment: 0 Active Inactive Abuse / Safety / Falls / Self Care Management Nursing Diagnoses: Impaired physical mobility Potential for falls Goals: Patient will remain injury free Date Initiated: 05/04/2016 Goal Status: Active Interventions: Assess fall risk on admission and as needed Notes: Orientation to the Wound Care Program Nursing Diagnoses: Knowledge deficit related to the wound healing center program Goals: Patient/caregiver will verbalize understanding of the [facility] Date Initiated: 05/04/2016 Goal Status: Active Interventions: Provide education on orientation to the wound center Notes: Pain, Acute or Chronic Nursing Diagnoses: Pain, acute or chronic: actual or potential Goals: Patient/caregiver will verbalize adequate pain control between visits [name], [name] ([id number]) Date Initiated: 05/04/2016 Goal Status: Active Interventions: Complete pain assessment as per visit requirements Notes: Wound/Skin Impairment Nursing Diagnoses: Impaired tissue integrity Goals: Patient/caregiver will verbalize understanding of skin care regimen Date Initiated: 05/04/2016 Goal Status: Active Ulcer/skin breakdown  will  have a volume reduction of 30% by week 4 Date Initiated: 05/04/2016 Goal Status: Active Ulcer/skin breakdown will have a volume reduction of 50% by week 8 Date Initiated: 05/04/2016 Goal Status: Active Ulcer/skin breakdown will have a volume reduction of 80% by week 12 Date Initiated: 05/04/2016 Goal Status: Active Ulcer/skin breakdown will heal within 14 weeks Date Initiated: 05/04/2016 Goal Status: Active Interventions: Assess patient/caregiver ability to obtain necessary supplies Assess patient/caregiver ability to perform ulcer/skin care regimen upon admission and as needed Assess ulceration(s) every visit Notes: Electronic Signature(s) Signed: 05/04/2016 3:21:47 PM By: Montey Hora Entered By: Montey Hora on 05/04/2016 15:21:46 [name], [name] ([id number]) -------------------------------------------------------------------------------- Pain Assessment Details Patient Name: [name] Date of Service: 05/04/2016 2:15 PM Medical Record Number: [id number] Patient Account Number: [id number] Date of Birth/Sex: Aug 28, 1928 (82 y.o. Female) Treating RN: Montey Hora Primary Care Physician: Clayborn Bigness Other Clinician: Referring Physician: Phoebe Perch Treating Physician/Extender: Frann Rider in Treatment: 0 Active Problems Location of Pain Severity and Description of Pain Patient Has Paino Yes Site Locations Pain Location: Pain in Ulcers With Dressing Change: Yes Duration of the Pain. Constant / Intermittento Constant Character of Pain Describe the Pain: Aching, Sharp Pain Management and Medication Current Pain Management: Notes Topical or injectable lidocaine is offered to patient for acute pain when surgical debridement is performed. If needed, Patient is instructed to use over the counter pain medication for the following 24-48 hours after debridement. Wound care MDs do not prescribed pain medications. Patient has chronic pain or uncontrolled  pain. Patient has been instructed to make an appointment with their Primary Care Physician for pain management. Electronic Signature(s) Signed: 05/04/2016 5:03:06 PM By: Montey Hora Entered By: Montey Hora on 05/04/2016 14:32:53 [name] ([id number]) -------------------------------------------------------------------------------- Patient/Caregiver Education Details Patient Name: [name] Date of Service: 05/04/2016 2:15 PM Medical Record Number: [id number] Patient Account Number: [id number] Date of Birth/Gender: [birth year] (82 y.o. Female) Treating RN: Montey Hora Primary Care Physician: Clayborn Bigness Other Clinician: Referring Physician: Phoebe Perch Treating Physician/Extender: Frann Rider in Treatment: 0 Education Assessment Education Provided To: Patient and Caregiver Education Topics Provided Wound/Skin Impairment: Handouts: Other: wound care as ordered Methods: Demonstration, Explain/Verbal Responses: State content correctly Electronic Signature(s) Signed: 05/04/2016 5:03:06 PM By: Montey Hora Entered By: Montey Hora on 05/04/2016 15:24:27 [name], [name] ([id number]) -------------------------------------------------------------------------------- Wound Assessment Details Patient Name: [name] Date of Service: 05/04/2016 2:15 PM Medical Record Number: [id number] Patient Account Number: [id number] Date of Birth/Sex: [birth year] (82 y.o. Female) Treating RN: Montey Hora Primary Care Physician: Clayborn Bigness Other Clinician: Referring Physician: Phoebe Perch Treating Physician/Extender: Frann Rider in Treatment: 0 Wound Status Wound Number: 1 Primary Etiology: Trauma, Other Wound Location: Right Lower Leg Wound Status: Open Wounding Event: Trauma Comorbid History: Asthma, Hypertension Date Acquired: 04/21/2016 Weeks Of Treatment: 0 Clustered Wound: Yes Pending Amputation On Presentation Photos Wound  Measurements Length: (cm) 20.3 Width: (cm) 12.4 Depth: (cm) 0.2 Area: (cm) 197.7 Volume: (cm) 39.54 % Reduction in Area: 0% % Reduction in Volume: 0% Epithelialization: Small (1-33%) Tunneling: No Undermining: No Wound Description Full Thickness Without Exposed Foul Odor Af Classification: Support Structures Wound Margin: Flat and Intact Exudate Large Amount: Exudate Type: Purulent Exudate Color: yellow, brown, green ter Cleansing: No Wound Bed Granulation Amount: None Present (0%) Exposed Structure Necrotic Amount: Large (67-100%) Fascia Exposed: No [name], [name] ([id number]) Necrotic Quality: Eschar, Adherent Slough Fat Layer Exposed: No Tendon Exposed: No Muscle Exposed: No Joint Exposed: No Bone Exposed: No Limited to Skin Breakdown Periwound Skin Texture Texture  Color No Abnormalities Noted: No No Abnormalities Noted: No Callus: No Atrophie Blanche: No Crepitus: No Cyanosis: No Excoriation: No Ecchymosis: No Fluctuance: No Erythema: Yes Friable: No Erythema Location: Circumferential Induration: No Hemosiderin Staining: No Localized Edema: Yes Mottled: No Rash: No Pallor: No Scarring: No Rubor: No Moisture Temperature / Pain No Abnormalities Noted: No Temperature: No Abnormality Dry / Scaly: No Tenderness on Palpation: Yes Maceration: Yes Moist: Yes Wound Preparation Ulcer Cleansing: Rinsed/Irrigated with Saline Topical Anesthetic Applied: Other: lidocaine 4%, Treatment Notes Wound #1 (Right Lower Leg) 1. Cleansed with: Clean wound with Normal Saline 2. Anesthetic Topical Lidocaine 4% cream to wound bed prior to debridement 4. Dressing Applied: Santyl Ointment 5. Secondary Dressing Applied Non-Adherent pad ABD and Kerlix/Conform 7. Secured with Tape Notes netting Electronic Signature(s) [name], [name] ([id number]) Signed: 05/04/2016 5:03:06 PM By: Montey Hora Entered By: Montey Hora on 05/04/2016  15:39:42 [name], [name] ([id number]) -------------------------------------------------------------------------------- Wound Assessment Details Patient Name: [name] Date of Service: 05/04/2016 2:15 PM Medical Record Number: [id number] Patient Account Number: [id number] Date of Birth/Sex: February 08, 1929 (82 y.o. Female) Treating RN: Montey Hora Primary Care Physician: Clayborn Bigness Other Clinician: Referring Physician: Phoebe Perch Treating Physician/Extender: Frann Rider in Treatment: 0 Wound Status Wound Number: 2 Primary Etiology: Trauma, Other Wound Location: Left Foot - Dorsal Wound Status: Open Wounding Event: Trauma Comorbid History: Asthma, Hypertension Date Acquired: 04/21/2016 Weeks Of Treatment: 0 Clustered Wound: No Photos Wound Measurements Length: (cm) 3 Width: (cm) 0.5 Depth: (cm) 0.1 Area: (cm) 1.178 Volume: (cm) 0.118 % Reduction in Area: 0% % Reduction in Volume: 0% Epithelialization: None Tunneling: No Undermining: No Wound Description Classification: Partial Thickness Wound Margin: Flat and Intact Exudate Amount: Large Exudate Type: Serous Exudate Color: amber Foul Odor After Cleansing: No Wound Bed Granulation Amount: None Present (0%) Exposed Structure Necrotic Amount: Large (67-100%) Fascia Exposed: No Necrotic Quality: Eschar, Adherent Slough Fat Layer Exposed: No Tendon Exposed: No Muscle Exposed: No Smethers, Cameshia ([id number]) Joint Exposed: No Bone Exposed: No Limited to Skin Breakdown Periwound Skin Texture Texture Color No Abnormalities Noted: No No Abnormalities Noted: No Callus: No Atrophie Blanche: No Crepitus: No Cyanosis: No Excoriation: No Ecchymosis: No Fluctuance: No Erythema: Yes Friable: No Erythema Location: Circumferential Induration: No Hemosiderin Staining: No Localized Edema: No Mottled: No Rash: No Pallor: No Scarring: No Rubor: No Moisture Temperature / Pain No  Abnormalities Noted: No Temperature: No Abnormality Dry / Scaly: No Tenderness on Palpation: Yes Maceration: No Moist: No Wound Preparation Ulcer Cleansing: Rinsed/Irrigated with Saline Topical Anesthetic Applied: Other: lidocaine 4%, Treatment Notes Wound #2 (Left, Dorsal Foot) 1. Cleansed with: Clean wound with Normal Saline 2. Anesthetic Topical Lidocaine 4% cream to wound bed prior to debridement 4. Dressing Applied: Other dressing (specify in notes) 5. Secondary Dressing Applied Bordered Foam Dressing Notes sorbalgon Electronic Signature(s) Signed: 05/04/2016 5:03:06 PM By: Montey Hora Entered By: Montey Hora on 05/04/2016 15:40:18 [name], [name] ([id number]) -------------------------------------------------------------------------------- Wound Assessment Details Patient Name: [name] Date of Service: 05/04/2016 2:15 PM Medical Record Number: [id number] Patient Account Number: [id number] Date of Birth/Sex: 13-May-1929 (82 y.o. Female) Treating RN: Montey Hora Primary Care Physician: Clayborn Bigness Other Clinician: Referring Physician: Phoebe Perch Treating Physician/Extender: Frann Rider in Treatment: 0 Wound Status Wound Number: 3 Primary Etiology: Trauma, Other Wound Location: Right Elbow Wound Status: Open Wounding Event: Trauma Comorbid History: Asthma, Hypertension Date Acquired: 04/21/2016 Weeks Of Treatment: 0 Clustered Wound: No Photos Wound Measurements Length: (cm) 1.7 Width: (cm) 1.1 Depth: (cm) 0.1 Area: (cm) 1.469 Volume: (cm) 0.147 %  Reduction in Area: 0% % Reduction in Volume: 0% Epithelialization: None Tunneling: No Undermining: No Wound Description Classification: Partial Thickness Wound Margin: Flat and Intact Exudate Amount: Large Exudate Type: Serous Exudate Color: amber Foul Odor After Cleansing: No Wound Bed Granulation Amount: None Present (0%) Exposed Structure Necrotic Amount: Large  (67-100%) Fascia Exposed: No Necrotic Quality: Eschar, Adherent Slough Fat Layer Exposed: No Tendon Exposed: No Muscle Exposed: No Zill, Fawne ([id number]) Joint Exposed: No Bone Exposed: No Limited to Skin Breakdown Periwound Skin Texture Texture Color No Abnormalities Noted: No No Abnormalities Noted: No Callus: No Atrophie Blanche: No Crepitus: No Cyanosis: No Excoriation: No Ecchymosis: No Fluctuance: No Erythema: Yes Friable: No Erythema Location: Circumferential Induration: No Hemosiderin Staining: No Localized Edema: Yes Mottled: No Rash: No Pallor: No Scarring: No Rubor: No Moisture Temperature / Pain No Abnormalities Noted: No Temperature: No Abnormality Dry / Scaly: No Tenderness on Palpation: Yes Maceration: No Moist: No Wound Preparation Ulcer Cleansing: Rinsed/Irrigated with Saline Topical Anesthetic Applied: Other: lidocaine 4%, Treatment Notes Wound #3 (Right Elbow) 1. Cleansed with: Clean wound with Normal Saline 2. Anesthetic Topical Lidocaine 4% cream to wound bed prior to debridement 4. Dressing Applied: Other dressing (specify in notes) 5. Secondary Dressing Applied Bordered Foam Dressing Notes sorbalgon Electronic Signature(s) Signed: 05/04/2016 5:03:06 PM By: Montey Hora Entered By: Montey Hora on 05/04/2016 15:40:46 Jalbert, [name] ([id number]) -------------------------------------------------------------------------------- [place] Details Patient Name: [name] Date of Service: 05/04/2016 2:15 PM Medical Record Number: [id number] Patient Account Number: [id number] Date of Birth/Sex: [birth year] (82 y.o. Female) Treating RN: Montey Hora Primary Care Physician: Clayborn Bigness Other Clinician: Referring Physician: Phoebe Perch Treating Physician/Extender: Frann Rider in Treatment: 0 Vital Signs Time Taken: 14:34 Temperature (F): 97.5 Height (in): 60 Pulse (bpm): 76 Source:  Measured Respiratory Rate (breaths/min): 18 Weight (lbs): 122 Blood Pressure (mmHg): 150/58 Source: Measured Reference Range: 80 - 120 mg / dl Body Mass Index (BMI): 23.8 Electronic Signature(s) Signed: 05/04/2016 5:03:06 PM By: Montey Hora Entered By: Montey Hora on 05/04/2016 14:36:42

## 2016-05-08 ENCOUNTER — Telehealth: Payer: Self-pay

## 2016-05-08 NOTE — Telephone Encounter (Signed)
Patient's daughter Sula Soda) called stating that her mother is needing a refill on her Hydrocodone-Acetaminophen 5-325 MG. She stated that her mom has been referred to the wound care center. However, they are not able to prescribe her pain medications. I told her that I woukld ask Dr. Burt Knack if she could get a refill. Sula Soda understood.  I saw that Dr. Adonis Huguenin had given patient a refill on 05/02/2016 for # 20 tablets.

## 2016-05-08 NOTE — Telephone Encounter (Signed)
Called the [place] location and spoken with Museum/gallery conservator our nurse. I asked her if there was any way that she could ask Dr. Burt Knack for a refill on patient's pain medication. She recommended for me to tell patient to come in and see Dr. Burt Knack in order to get a refill. I told her that I would call the patient. I then called the patient and spoke with [name]. I told her that we would have to see her mom in order to evaluate her before she could get a refill on her pain medication. [name] and patient understood and had o further questions. I told them that she would be seen at our [place] location at 11:15 AM.

## 2016-05-09 ENCOUNTER — Ambulatory Visit (INDEPENDENT_AMBULATORY_CARE_PROVIDER_SITE_OTHER): Payer: Medicare Other | Admitting: Surgery

## 2016-05-09 DIAGNOSIS — S81809S Unspecified open wound, unspecified lower leg, sequela: Secondary | ICD-10-CM | POA: Diagnosis not present

## 2016-05-09 MED ORDER — HYDROCODONE-ACETAMINOPHEN 5-325 MG PO TABS: 1.0000 | ORAL_TABLET | ORAL | 0 refills | Status: AC | PRN
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
## 2016-05-09 NOTE — Patient Instructions (Signed)
Please call our office if you have any questions. Please continue to follow up with the wound center.

## 2016-05-09 NOTE — Addendum Note (Signed)
Addended by: Celene Kras on: 05/09/2016 11:45 AM   Modules accepted: Orders

## 2016-05-11 ENCOUNTER — Encounter: Payer: Medicare Other | Admitting: Surgery

## 2016-05-11 DIAGNOSIS — C44722 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, including hip: Secondary | ICD-10-CM | POA: Diagnosis not present

## 2016-05-12 NOTE — Progress Notes (Signed)
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 circumferential. Periwound temperature was noted as No Abnormality. The periwound has tenderness on palpation. Wound #2 status is Open. Original cause of wound was Trauma. The wound is located on the Left,Dorsal Foot. The wound measures 3cm length x 0.5cm width x 0.1cm depth; 1.178cm^2 area and 0.118cm^3 volume. The wound is limited to skin breakdown. There is no tunneling or undermining noted. There is a large amount of serous drainage noted. The wound margin is flat and intact. There is no granulation within the wound bed. There is a large (67-100%) amount of necrotic tissue within the wound bed including Eschar and Adherent Slough. The periwound skin appearance exhibited: Moist, Erythema. The periwound skin appearance did not exhibit: Callus, Crepitus, Excoriation, Fluctuance, Friable, Induration, Localized Edema, Rash, Scarring, Dry/Scaly, Maceration, Atrophie Blanche, Cyanosis, Ecchymosis, Hemosiderin Staining, Mottled, Pallor, Rubor. The surrounding wound skin color is noted with  erythema which is circumferential. Periwound temperature was noted as No Abnormality. The periwound has tenderness on palpation. Wound #3 status is Open.  Original cause of wound was Trauma. The wound is located on the Right Elbow. The wound measures 1.5cm length x 1cm width x 0.1cm depth; 1.178cm^2 area and 0.118cm^3 volume. The wound is limited to skin breakdown. There is no tunneling or undermining noted. There is a large amount of serous drainage noted. The wound margin is flat and intact. There is no granulation within the wound bed. There is a large (67-100%) amount of necrotic tissue within the wound bed including Eschar and Adherent Slough. The periwound skin appearance exhibited: Localized Edema, Moist, Erythema. The periwound skin appearance did not exhibit: Callus, Crepitus, Excoriation, Fluctuance, Friable, Induration, Rash, Scarring, Dry/Scaly, Maceration, Atrophie Blanche, Cyanosis, Ecchymosis, Hemosiderin Staining, Mottled, Pallor, Rubor. The surrounding wound skin color is noted with erythema which is circumferential. Periwound temperature was noted as No Abnormality. The periwound has tenderness on palpation. 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 There was a Skin/Subcutaneous Tissue Debridement [id number]) debridement with total area of 75 sq cm performed by Christin Fudge, MD. with the following instrument(Ashley): Curette to remove Viable and Non-Viable tissue/material including  Exudate, Fibrin/Slough, Eschar, Other, and Subcutaneous after achieving pain control using Lidocaine 4% Topical Solution. A time out was conducted at 13:31, prior to the start of the procedure. A Minimum amount of bleeding was controlled with Pressure. The procedure was tolerated well with a pain level of 0 throughout and a pain level of 0 following the procedure. Post Debridement Measurements: 20cm length x 5cm width x 5cm depth; 392.699cm^3 volume. Character of Wound/Ulcer Post Debridement is stable. Severity of Tissue Post Debridement is: Fat layer exposed. Post procedure Diagnosis Wound #1: Same as Pre-Procedure General Notes: stents of debridement with a #3 curet was done and necrotic skin and subcutaneous tissue and hematoma was removed. I irrigated the wound with saline and moist gauze and inferiorly and superiorly that is significant amount of undermining and tunneling. Wound #2 Wound #2 is a Trauma, Other located on the Left,Dorsal Foot . There was a Skin/Subcutaneous Tissue Debridement [id number]) debridement with total area of 1.5 sq cm performed by Christin Fudge, MD. with the following instrument(Ashley): Curette to remove Non-Viable tissue/material including Exudate, Fibrin/Slough, Eschar, and Subcutaneous after achieving pain control using Lidocaine 4% Topical Solution. A time out was conducted at 13:27, prior to the start of the procedure. A Minimum amount of bleeding was controlled with Pressure. The procedure was tolerated well with a pain level of 0 throughout and a pain level of 0 following the procedure. Post Debridement Measurements: 3cm length x 0.5cm width x 0.1cm depth; 0.118cm^3 volume. Character of Wound/Ulcer Post Debridement is stable. Severity of Tissue Post Debridement is: Fat layer exposed. Post procedure Diagnosis Wound #2: Same as Pre-Procedure Plan Wound Cleansing: Wound #1 Right Lower Leg: Clean wound with Normal  Saline. May Shower, gently pat wound dry  prior to applying new dressing. Wound #2 Left,Dorsal Foot: Clean wound with Normal Saline. May Shower, gently pat wound dry prior to applying new dressing. Wound #3 Right Elbow: Clean wound with Normal Saline. JALYSSA, MAMONE ([id number]) May Shower, gently pat wound dry prior to applying new dressing. Anesthetic: Wound #1 Right Lower Leg: Topical Lidocaine 4% cream applied to wound bed prior to debridement Wound #2 Left,Dorsal Foot: Topical Lidocaine 4% cream applied to wound bed prior to debridement Wound #3 Right Elbow: Topical Lidocaine 4% cream applied to wound bed prior to debridement Primary Wound Dressing: Wound #1 Right Lower Leg: Santyl Ointment Aquacel Ag - on the proximal part of the wound. Pack wound with: - plain packing gauze to tunnels at 12 o'clock ad 6 o'clock Wound #2 Left,Dorsal Foot: Aquacel Ag - or equivalent Wound #3 Right Elbow: Aquacel Ag - or equivalent Secondary Dressing: Wound #1 Right Lower Leg: ABD and Kerlix/Conform Wound #2 Left,Dorsal Foot: ABD and Kerlix/Conform Wound #3 Right Elbow: Boardered Foam Dressing - or other cover bandage Dressing Change Frequency: Wound #1 Right Lower Leg: Change dressing every day. Wound #2 Left,Dorsal Foot: Change dressing every day. Wound #3 Right Elbow: Change dressing every day. Follow-up Appointments: Wound #1 Right Lower Leg: Return Appointment in 1 week. Wound #2 Left,Dorsal Foot: Return Appointment in 1 week. Wound #3 Right Elbow: Return Appointment in 1 week. Edema Control: Wound #1 Right Lower Leg: Elevate legs to the level of the heart and pump ankles as often as possible Wound #2 Left,Dorsal Foot: Elevate legs to the level of the heart and pump ankles as often as possible Additional Orders / Instructions: Wound #1 Right Lower Leg: Increase protein intake. Wound #2 Left,Dorsal Foot: Increase protein intake. Wound #3 Right Elbow: Increase protein intake. Home Health: EMMALEAH, MONCION  ([id number]) Wound #1 Right Lower Leg: [place] Nurse may visit PRN to address patient Ashley wound care needs. FACE TO FACE ENCOUNTER: MEDICARE and MEDICAID PATIENTS: I certify that this patient is under my care and that I had a face-to-face encounter that meets the physician face-to-face encounter requirements with this patient on this date. The encounter with the patient was in whole or in part for the following MEDICAL CONDITION: (primary reason for [place]) MEDICAL NECESSITY: I certify, that based on my findings, NURSING services are a medically necessary home health service. HOME BOUND STATUS: I certify that my clinical findings support that this patient is homebound (i.e., Due to illness or injury, pt requires aid of supportive devices such as crutches, cane, wheelchairs, walkers, the use of special transportation or the assistance of another person to leave their place of residence. There is a normal inability to leave the home and doing so requires considerable and taxing effort. Other absences are for medical reasons / religious services and are infrequent or of short duration when for other reasons). If current dressing causes regression in wound condition, may D/C ordered dressing product/Ashley and apply Normal Saline Moist Dressing daily until next [place] / Other MD appointment. [place] of regression in wound condition at [phone]. Please direct any NON-WOUND related issues/requests for orders to patient'Ashley Primary Care Physician Wound #2 Left,Dorsal Foot: [place] Nurse may visit PRN to address patient Ashley wound care needs. FACE TO FACE ENCOUNTER: MEDICARE and MEDICAID PATIENTS: I certify that this patient is under my care and that I had a face-to-face encounter that  meets the physician face-to-face encounter requirements with this patient on this date. The encounter with the patient  was in whole or in part for the following MEDICAL CONDITION: (primary reason for [place]) MEDICAL NECESSITY: I certify, that based on my findings, NURSING services are a medically necessary home health service. HOME BOUND STATUS: I certify that my clinical findings support that this patient is homebound (i.e., Due to illness or injury, pt requires aid of supportive devices such as crutches, cane, wheelchairs, walkers, the use of special transportation or the assistance of another person to leave their place of residence. There is a normal inability to leave the home and doing so requires considerable and taxing effort. Other absences are for medical reasons / religious services and are infrequent or of short duration when for other reasons). If current dressing causes regression in wound condition, may D/C ordered dressing product/Ashley and apply Normal Saline Moist Dressing daily until next [place] / Other MD appointment. [place] of regression in wound condition at [phone]. Please direct any NON-WOUND related issues/requests for orders to patient'Ashley Primary Care Physician Wound #3 Right Elbow: [place] Nurse may visit PRN to address patient Ashley wound care needs. FACE TO FACE ENCOUNTER: MEDICARE and MEDICAID PATIENTS: I certify that this patient is under my care and that I had a face-to-face encounter that meets the physician face-to-face encounter requirements with this patient on this date. The encounter with the patient was in whole or in part for the following MEDICAL CONDITION: (primary reason for [place]) MEDICAL NECESSITY: I certify, that based on my findings, NURSING services are a medically necessary home health service. HOME BOUND STATUS: I certify that my clinical findings support that this patient is homebound (i.e., Due to illness or injury, pt requires aid of supportive devices such as crutches, cane,  wheelchairs, walkers, the use of special transportation or the assistance of another person to leave their place of residence. There is a normal inability to leave the home and doing so requires considerable and taxing effort. Other absences are for medical reasons / religious services and are infrequent or of short duration when for other reasons). If current dressing causes regression in wound condition, may D/C ordered dressing product/Ashley and apply Normal Saline Moist Dressing daily until next [place] / Other MD appointment. [place] of regression in wound condition at [phone]. Please direct any NON-WOUND related issues/requests for orders to patient'Ashley Primary Care Physician TARSHIA, KETTLEWELL ([id number]) Medications-please add to medication list.: Wound #1 Right Lower Leg: Santyl Enzymatic Ointment After review I have recommended: 1. Daily washing of her wounds with soap and water and application of Santyl ointment to the areas depicted clearly to her and her family members on the right lower extremity 2. Silver alginate to her left dorsum foot with a light dressing over this 3. Silver alginate to the right elbow and a light dressing over this 4. I have discussed the importance of regular dressings and good wound care. If there are any signs of cellulitis or sepsis I was asked to get in touch with [place] immediately or contact her PCP. 5. Discussed regular wound care visits and she and her daughter were all questions answered Electronic Signature(Ashley) Signed: 05/11/2016 3:45:37 PM By: Christin Fudge MD, FACS Previous Signature: 05/11/2016 1:56:57 PM Version By: Christin Fudge MD, FACS Entered By: Christin Fudge on 05/11/2016 15:45:37 Molino, [name] ([id number]) -------------------------------------------------------------------------------- SuperBill Details Patient Name: [name] Date  of Service: 05/11/2016 Medical Record Patient Account  Number: [id number] Number: [place], RN, BSN, Treating RN: Oct 08, 1928 (82 y.o. 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.522 Non-pressure chronic ulcer of other part of left foot with fat layer exposed C44.722 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, including hip C44.729 Squamous cell carcinoma of skin of left lower limb, including hip Facility Procedures CPT4 Code Description: [id number] - DEB SUBQ TISSUE 20 SQ CM/< ICD-10 Description Diagnosis S51.011A Laceration without foreign body of right elbow, initial L97.312 Non-pressure chronic ulcer of right ankle with fat laye L97.522 Non-pressure chronic  ulcer of other part of left foot w Modifier: encounter r exposed ith fat layer Quantity: 1 exposed CPT4 Code Description: [id number] 11045 - DEB SUBQ TISS EA ADDL 20CM ICD-10 Description Diagnosis S51.011A Laceration without foreign body of right elbow, initial L97.312 Non-pressure chronic ulcer of right ankle with fat laye L97.522 Non-pressure chronic  ulcer of other part of left foot w Modifier: encounter r exposed ith fat layer Quantity: 3 exposed Physician Procedures CPT4 Code Description: [id number] 11042 - WC PHYS SUBQ TISS 20 SQ CM ICD-10 Description Diagnosis S51.011A Laceration without foreign body of right elbow, initial MADYSSON, BLINDER ([id number]) Modifier: encounter Quantity: 1 Electronic Signature(Ashley) Signed: 05/11/2016 3:45:48 PM By: Christin Fudge MD, FACS Previous  Signature: 05/11/2016 1:57:19 PM Version By: Christin Fudge MD, FACS Entered By: Christin Fudge on 05/11/2016 15:45:48

## 2016-05-12 NOTE — Progress Notes (Addendum)
[name] [name] ([id number]) Visit Report for 05/11/2016 Arrival Information Details Patient Name: [name], [name] Date of Service: 05/11/2016 12:45 PM Medical Record Number: [id number] Patient Account Number: [id number] Date of Birth/Sex: 10/20/1928 (82 y.o. Female) Treating RN: Afful, RN, BSN, Velva Harman Primary Care Physician: Clayborn Bigness Other Clinician: Referring Physician: Clayborn Bigness Treating Physician/Extender: Frann Rider in Treatment: 1 Visit Information History Since Last Visit All ordered tests and consults were completed: No Patient Arrived: [name] [name] Added or deleted any medications: No Arrival Time: 12:50 Any new allergies or adverse reactions: No Accompanied By: dtr Had a fall or experienced change in No Transfer Assistance: None activities of daily living that may affect Patient Identification Verified: Yes risk of falls: Secondary Verification Process Completed: Yes Signs or symptoms of abuse/neglect since last No Patient Requires Transmission-Based No visito Precautions: Hospitalized since last visit: No Patient Has Alerts: No Has Dressing in Place as Prescribed: Yes Pain Present Now: No Electronic Signature(s) Signed: 05/11/2016 12:51:01 PM By: Regan Lemming BSN, RN Entered By: Regan Lemming on 05/11/2016 12:51:01 [name] [name] ([id number]) -------------------------------------------------------------------------------- Encounter Discharge Information Details Patient Name: [name] [name] Date of Service: 05/11/2016 12:45 PM Medical Record Number: [id number] Patient Account Number: [id number] Date of Birth/Sex: [birth year] (82 y.o. Female) Treating RN: Baruch Gouty, RN, BSN, Velva Harman Primary Care Physician: Clayborn Bigness Other Clinician: Referring Physician: Clayborn Bigness Treating Physician/Extender: Frann Rider in Treatment: 1 Encounter Discharge Information Items Discharge Pain Level: 0 Discharge Condition: Stable Ambulatory Status:  Walker Discharge Destination: Home Transportation: Private Auto Accompanied By: dtr Schedule Follow-up Appointment: No Medication Reconciliation completed and provided to Patient/Care No [name] [name]: Provided on Clinical Summary of Care: 05/11/2016 Form Type Recipient Paper Patient LP Electronic Signature(s) Signed: 05/11/2016 2:04:52 PM By: Ruthine Dose Entered By: Ruthine Dose on 05/11/2016 14:04:52 [name] [name] [name] [name] ([id number]) -------------------------------------------------------------------------------- Lower Extremity Assessment Details Patient Name: [name] [name] Date of Service: 05/11/2016 12:45 PM Medical Record Number: [id number] Patient Account Number: [id number] Date of Birth/Sex: November 18, 1928 (82 y.o. Female) Treating RN: Afful, RN, BSN, [name] [name] Primary Care Physician: Clayborn Bigness Other Clinician: Referring Physician: Clayborn Bigness Treating Physician/Extender: Frann Rider in Treatment: 1 Edema Assessment Assessed: [Left: No] [Right: No] Edema: [Left: Yes] [Right: Yes] Calf Left: Right: Point of Measurement: 30 cm From Medial Instep 34.4 cm 31.3 cm Ankle Left: Right: Point of Measurement: 10 cm From Medial Instep 21 cm 19.4 cm Vascular Assessment Claudication: Claudication Assessment [Left:None] [Right:None] Pulses: Posterior Tibial Dorsalis Pedis Palpable: [Left:Yes] [Right:Yes] Extremity colors, hair growth, and conditions: Extremity Color: [Left:Mottled] [Right:Mottled] Hair Growth on Extremity: [Left:No] [Right:No] Temperature of Extremity: [Left:Warm] [Right:Warm] Capillary Refill: [Left:< 3 seconds] [Right:< 3 seconds] Toe Nail Assessment Left: Right: Thick: Yes Yes Discolored: Yes Yes Deformed: No No Improper Length and Hygiene: No No Electronic Signature(s) Signed: 05/11/2016 12:52:03 PM By: Regan Lemming BSN, RN Entered By: Regan Lemming on 05/11/2016 12:52:03 [name] [name] [name] [name] ([id number]) [name] [name] [name] [name]  ([id number]) -------------------------------------------------------------------------------- Multi Wound Chart Details Patient Name: [name] [name] Date of Service: 05/11/2016 12:45 PM Medical Record Number: [id number] Patient Account Number: [id number] Date of Birth/Sex: 11/12/1928 (82 y.o. Female) Treating RN: Baruch Gouty, RN, BSN, Velva Harman Primary Care Physician: Clayborn Bigness Other Clinician: Referring Physician: Clayborn Bigness Treating Physician/Extender: Frann Rider in Treatment: 1 Vital Signs Height(in): 60 Pulse(bpm): 72 Weight(lbs): 122 Blood Pressure 148/53 (mmHg): Body Mass Index(BMI): 24 Temperature(F): 97.5 Respiratory Rate 18 (breaths/min): Photos: [1:No Photos] [2:No Photos] [3:No Photos] Wound Location: [1:Right Lower Leg] [2:Left Foot - Dorsal] [3:Right Elbow] Wounding Event: [1:Trauma] [2:Trauma] [3:Trauma] Primary Etiology: [1:Trauma, Other] [2:Trauma, Other] [3:Trauma, Other] Comorbid  History: [1:Asthma, Hypertension] [2:Asthma, Hypertension] [3:Asthma, Hypertension] Date Acquired: [1:04/21/2016] [2:04/21/2016] [3:04/21/2016] Weeks of Treatment: [1:1] [2:1] [3:1] Wound Status: [1:Open] [2:Open] [3:Open] Clustered Wound: [1:Yes] [2:No] [3:No] Pending Amputation on Yes [2:No] [3:No] Presentation: Measurements L x W x D 20x5x0.2 [2:3x0.5x0.1] [3:1.5x1x0.1] (cm) Area (cm) : [1:78.54] [2:1.178] [3:1.178] Volume (cm) : [1:15.708] [2:0.118] [3:0.118] % Reduction in Area: [1:60.30%] [2:0.00%] [3:19.80%] % Reduction in Volume: 60.30% [2:0.00%] [3:19.70%] Classification: [1:Full Thickness Without Exposed Support Structures] [2:Partial Thickness] [3:Partial Thickness] Exudate Amount: [1:Large] [2:Large] [3:Large] Exudate Type: [1:Purulent] [2:Serous] [3:Serous] Exudate Color: [1:yellow, brown, green] [2:amber] [3:amber] Wound Margin: [1:Flat and Intact] [2:Flat and Intact] [3:Flat and Intact] Granulation Amount: [1:None Present (0%)] [2:None Present (0%)]  [3:None Present (0%)] Necrotic Amount: [1:Large (67-100%)] [2:Large (67-100%)] [3:Large (67-100%)] Necrotic Tissue: [1:Eschar, Adherent Slough] [2:Eschar, Adherent Slough] [3:Eschar, Adherent Slough] Exposed Structures: [1:Fascia: No Fat: No Tendon: No] [2:Fascia: No Fat: No Tendon: No] [3:Fascia: No Fat: No Tendon: No] Muscle: No Muscle: No Muscle: No Joint: No Joint: No Joint: No Bone: No Bone: No Bone: No Limited to Skin Limited to Skin Limited to Skin Breakdown Breakdown Breakdown Epithelialization: Small (1-33%) None None Periwound Skin Texture: Edema: Yes Edema: No Edema: Yes Excoriation: No Excoriation: No Excoriation: No Induration: No Induration: No Induration: No Callus: No Callus: No Callus: No Crepitus: No Crepitus: No Crepitus: No Fluctuance: No Fluctuance: No Fluctuance: No Friable: No Friable: No Friable: No Rash: No Rash: No Rash: No Scarring: No Scarring: No Scarring: No Periwound Skin Maceration: Yes Moist: Yes Moist: Yes Moisture: Moist: Yes Maceration: No Maceration: No Dry/Scaly: No Dry/Scaly: No Dry/Scaly: No Periwound Skin Color: Erythema: Yes Erythema: Yes Erythema: Yes Atrophie Blanche: No Atrophie Blanche: No Atrophie Blanche: No Cyanosis: No Cyanosis: No Cyanosis: No Ecchymosis: No Ecchymosis: No Ecchymosis: No Hemosiderin Staining: No Hemosiderin Staining: No Hemosiderin Staining: No Mottled: No Mottled: No Mottled: No Pallor: No Pallor: No Pallor: No Rubor: No Rubor: No Rubor: No Erythema Location: Circumferential Circumferential Circumferential Temperature: No Abnormality No Abnormality No Abnormality Tenderness on Yes Yes Yes Palpation: Wound Preparation: Ulcer Cleansing: Ulcer Cleansing: Ulcer Cleansing: Rinsed/Irrigated with Rinsed/Irrigated with Rinsed/Irrigated with Saline Saline Saline Topical Anesthetic Topical Anesthetic Topical Anesthetic Applied: Other: lidocaine Applied: Other: lidocaine  Applied: Other: lidocaine 4% 4% 4% Treatment Notes Electronic Signature(s) Signed: 05/11/2016 4:56:10 PM By: Regan Lemming BSN, RN Entered By: Regan Lemming on 05/11/2016 13:15:41 [name] [name] [name] [name] ([id number]) -------------------------------------------------------------------------------- Multi-Disciplinary Care Plan Details Patient Name: [name] [name] Date of Service: 05/11/2016 12:45 PM Medical Record Number: [id number] Patient Account Number: [id number] Date of Birth/Sex: 03/01/1929 (82 y.o. Female) Treating RN: Afful, RN, BSN, Velva Harman Primary Care Physician: Clayborn Bigness Other Clinician: Referring Physician: Clayborn Bigness Treating Physician/Extender: Frann Rider in Treatment: 1 Active Inactive Abuse / Safety / Falls / Self Care Management Nursing Diagnoses: Impaired physical mobility Potential for falls Goals: Patient will remain injury free Date Initiated: 05/04/2016 Goal Status: Active Interventions: Assess fall risk on admission and as needed Notes: Orientation to the Wound Care Program Nursing Diagnoses: Knowledge deficit related to the wound healing center program Goals: Patient/caregiver will verbalize understanding of the [place] Program Date Initiated: 05/04/2016 Goal Status: Active Interventions: Provide education on orientation to the wound center Notes: Pain, Acute or Chronic Nursing Diagnoses: Pain, acute or chronic: actual or potential Goals: Patient/caregiver will verbalize adequate pain control between visits [name] [name] [name] [name] ([id number]) Date Initiated: 05/04/2016 Goal Status: Active Interventions: Complete pain assessment as per visit requirements Notes: Wound/Skin Impairment Nursing Diagnoses: Impaired tissue integrity Goals: Patient/caregiver will verbalize understanding  of skin care regimen Date Initiated: 05/04/2016 Goal Status: Active Ulcer/skin breakdown will have a volume reduction of 30% by week 4 Date Initiated:  05/04/2016 Goal Status: Active Ulcer/skin breakdown will have a volume reduction of 50% by week 8 Date Initiated: 05/04/2016 Goal Status: Active Ulcer/skin breakdown will have a volume reduction of 80% by week 12 Date Initiated: 05/04/2016 Goal Status: Active Ulcer/skin breakdown will heal within 14 weeks Date Initiated: 05/04/2016 Goal Status: Active Interventions: Assess patient/caregiver ability to obtain necessary supplies Assess patient/caregiver ability to perform ulcer/skin care regimen upon admission and as needed Assess ulceration(s) every visit Notes: Electronic Signature(s) Signed: 05/11/2016 4:56:10 PM By: Regan Lemming BSN, RN Entered By: Regan Lemming on 05/11/2016 13:15:34 [name] [name] ([id number]) -------------------------------------------------------------------------------- Pain Assessment Details Patient Name: [name] [name] Date of Service: 05/11/2016 12:45 PM Medical Record Number: [id number] Patient Account Number: [id number] Date of Birth/Sex: 1928-10-28 (82 y.o. Female) Treating RN: Baruch Gouty, RN, BSN, Velva Harman Primary Care Physician: Clayborn Bigness Other Clinician: Referring Physician: Clayborn Bigness Treating Physician/Extender: Frann Rider in Treatment: 1 Active Problems Location of Pain Severity and Description of Pain Patient Has Paino No Site Locations With Dressing Change: No Pain Management and Medication Current Pain Management: Electronic Signature(s) Signed: 05/11/2016 12:51:10 PM By: Regan Lemming BSN, RN Entered By: Regan Lemming on 05/11/2016 12:51:09 [name] [name] ([id number]) -------------------------------------------------------------------------------- Patient/Caregiver Education Details Patient Name: [name] [name] Date of Service: 05/11/2016 12:45 PM Medical Record Number: [id number] Patient Account Number: [id number] Date of Birth/Gender: 1929-01-24 (82 y.o. Female) Treating RN: Baruch Gouty, RN, BSN, Velva Harman Primary Care Physician:  Clayborn Bigness Other Clinician: Referring Physician: Clayborn Bigness Treating Physician/Extender: Frann Rider in Treatment: 1 Education Assessment Education Provided To: Patient Education Topics Provided Basic Hygiene: Methods: Explain/Verbal Responses: State content correctly Welcome To [facility]: Methods: Explain/Verbal Responses: State content correctly Wound Debridement: Methods: Explain/Verbal Responses: State content correctly Wound/Skin Impairment: Methods: Explain/Verbal Responses: State content correctly Electronic Signature(s) Signed: 05/11/2016 4:56:10 PM By: Regan Lemming BSN, RN Entered By: Regan Lemming on 05/11/2016 14:02:15 [name] [name] ([id number]) -------------------------------------------------------------------------------- Wound Assessment Details Patient Name: [name] [name] Date of Service: 05/11/2016 12:45 PM Medical Record Number: [id number] Patient Account Number: [id number] Date of Birth/Sex: December 14, 1928 (82 y.o. Female) Treating RN: Afful, RN, BSN, [place] Primary Care Physician: Clayborn Bigness Other Clinician: Referring Physician: Clayborn Bigness Treating Physician/Extender: Frann Rider in Treatment: 1 Wound Status Wound Number: 1 Primary Etiology: Trauma, Other Wound Location: Right Lower Leg Wound Status: Open Wounding Event: Trauma Comorbid History: Asthma, Hypertension Date Acquired: 04/21/2016 Weeks Of Treatment: 1 Clustered Wound: Yes Pending Amputation On Presentation Photos Photo Uploaded By: Regan Lemming on 05/11/2016 16:42:28 Wound Measurements Length: (cm) 20 Width: (cm) 5 Depth: (cm) 0.2 Area: (cm) 78.54 Volume: (cm) 15.708 [name] [name] ([id number]) % Reduction in Area: 60.3% % Reduction in Volume: 60.3% Epithelialization: Small (1-33%) Tunneling: Yes Location 1 Position (o'clock): 12 Maximum Distance: (cm) 5 Location 2 Position (o'clock): 6 Maximum Distance: (cm) 4 Undermining: No Wound  Description Full Thickness Without Exposed Classification: Support Structures Wound Margin: Flat and Intact Exudate Large Amount: Exudate Type: Purulent Exudate Color: yellow, brown, green Foul Odor After Cleansing: No Wound Bed Granulation Amount: None Present (0%) Exposed Structure Necrotic Amount: Large (67-100%) Fascia Exposed: No Necrotic Quality: Eschar, Adherent Slough Fat Layer Exposed: No Tendon Exposed: No Muscle Exposed: No Joint Exposed: No Bone Exposed: No Limited to Skin Breakdown Periwound Skin Texture Texture Color No Abnormalities Noted: No No Abnormalities Noted: No Callus: No Atrophie Blanche: No Crepitus: No Cyanosis: No Excoriation: No  Ecchymosis: No Fluctuance: No Erythema: Yes Friable: No Erythema Location: Circumferential Induration: No Hemosiderin Staining: No Localized Edema: Yes Mottled: No Rash: No Pallor: No Scarring: No Rubor: No Moisture Temperature / Pain No Abnormalities Noted: No Temperature: No Abnormality Dry / Scaly: No Tenderness on Palpation: Yes Maceration: Yes Moist: Yes Wound Preparation Ulcer Cleansing: Rinsed/Irrigated with Saline Topical Anesthetic Applied: Other: lidocaine 4%, Treatment Notes Wound #1 (Right Lower Leg) [name] [name] ([id number]) 1. Cleansed with: Clean wound with Normal Saline 4. Dressing Applied: Santyl Ointment 5. Secondary Dressing Applied Guaze, ABD and kerlix/Conform 7. Secured with Tape Other (specify in notes) Notes ace wrap Electronic Signature(s) Signed: 05/11/2016 4:56:10 PM By: Regan Lemming BSN, RN Entered By: Regan Lemming on 05/11/2016 14:03:12 [name] [name] [name] [name] ([id number]) -------------------------------------------------------------------------------- Wound Assessment Details Patient Name: [name] [name] Date of Service: 05/11/2016 12:45 PM Medical Record Number: [id number] Patient Account Number: [id number] Date of Birth/Sex: 1928/12/17 (82 y.o.  Female) Treating RN: Afful, RN, BSN, [facility] Primary Care Physician: Clayborn Bigness Other Clinician: Referring Physician: Clayborn Bigness Treating Physician/Extender: Frann Rider in Treatment: 1 Wound Status Wound Number: 2 Primary Etiology: Trauma, Other Wound Location: Left Foot - Dorsal Wound Status: Open Wounding Event: Trauma Comorbid History: Asthma, Hypertension Date Acquired: 04/21/2016 Weeks Of Treatment: 1 Clustered Wound: No Photos Photo Uploaded By: Regan Lemming on 05/11/2016 16:44:16 Wound Measurements Length: (cm) 3 Width: (cm) 0.5 Depth: (cm) 0.1 Area: (cm) 1.178 Volume: (cm) 0.118 % Reduction in Area: 0% % Reduction in Volume: 0% Epithelialization: None Tunneling: No Undermining: No Wound Description Classification: Partial Thickness Wound Margin: Flat and Intact Exudate Amount: Large Exudate Type: Serous Exudate Color: amber [name] [name] [name] [name] ([id number]) Foul Odor After Cleansing: No Wound Bed Granulation Amount: None Present (0%) Exposed Structure Necrotic Amount: Large (67-100%) Fascia Exposed: No Necrotic Quality: Eschar, Adherent Slough Fat Layer Exposed: No Tendon Exposed: No Muscle Exposed: No Joint Exposed: No Bone Exposed: No Limited to Skin Breakdown Periwound Skin Texture Texture Color No Abnormalities Noted: No No Abnormalities Noted: No Callus: No Atrophie Blanche: No Crepitus: No Cyanosis: No Excoriation: No Ecchymosis: No Fluctuance: No Erythema: Yes Friable: No Erythema Location: Circumferential Induration: No Hemosiderin Staining: No Localized Edema: No Mottled: No Rash: No Pallor: No Scarring: No Rubor: No Moisture Temperature / Pain No Abnormalities Noted: No Temperature: No Abnormality Dry / Scaly: No Tenderness on Palpation: Yes Maceration: No Moist: Yes Wound Preparation Ulcer Cleansing: Rinsed/Irrigated with Saline Topical Anesthetic Applied: Other: lidocaine 4%, Treatment Notes Wound #2 (Left,  Dorsal Foot) 1. Cleansed with: Clean wound with Normal Saline 2. Anesthetic Topical Lidocaine 4% cream to wound bed prior to debridement 4. Dressing Applied: Other dressing (specify in notes) 5. Secondary Dressing Applied Bordered Foam Dressing Aquacel Ag 7. Secured with Tape [name] [name] [name] [name] ([id number]) Electronic Signature(s) Signed: 05/11/2016 4:56:10 PM By: Regan Lemming BSN, RN Entered By: Regan Lemming on 05/11/2016 13:14:59 [name] [name] ([id number]) -------------------------------------------------------------------------------- Wound Assessment Details Patient Name: [name] [name] Date of Service: 05/11/2016 12:45 PM Medical Record Number: [id number] Patient Account Number: [id number] Date of Birth/Sex: 1929-04-30 (82 y.o. Female) Treating RN: Baruch Gouty, RN, BSN, Velva Harman Primary Care Physician: Clayborn Bigness Other Clinician: Referring Physician: Clayborn Bigness Treating Physician/Extender: Frann Rider in Treatment: 1 Wound Status Wound Number: 3 Primary Etiology: Trauma, Other Wound Location: Right Elbow Wound Status: Open Wounding Event: Trauma Comorbid History: Asthma, Hypertension Date Acquired: 04/21/2016 Weeks Of Treatment: 1 Clustered Wound: No Photos Photo Uploaded By: Regan Lemming on 05/11/2016 16:44:17 Wound Measurements Length: (cm) 1.5 Width: (cm) 1 Depth: (cm) 0.1 Area: (cm) 1.178  Volume: (cm) 0.118 % Reduction in Area: 19.8% % Reduction in Volume: 19.7% Epithelialization: None Tunneling: No Undermining: No Wound Description Classification: Partial Thickness Wound Margin: Flat and Intact Exudate Amount: Large Exudate Type: Serous Exudate Color: amber Foul Odor After Cleansing: No Wound Bed Granulation Amount: None Present (0%) Exposed Structure Necrotic Amount: Large (67-100%) Fascia Exposed: No Necrotic Quality: Eschar, Adherent Slough Fat Layer Exposed: No Tendon Exposed: No [name] [name], Katheline ([id number]) Muscle Exposed:  No Joint Exposed: No Bone Exposed: No Limited to Skin Breakdown Periwound Skin Texture Texture Color No Abnormalities Noted: No No Abnormalities Noted: No Callus: No Atrophie Blanche: No Crepitus: No Cyanosis: No Excoriation: No Ecchymosis: No Fluctuance: No Erythema: Yes Friable: No Erythema Location: Circumferential Induration: No Hemosiderin Staining: No Localized Edema: Yes Mottled: No Rash: No Pallor: No Scarring: No Rubor: No Moisture Temperature / Pain No Abnormalities Noted: No Temperature: No Abnormality Dry / Scaly: No Tenderness on Palpation: Yes Maceration: No Moist: Yes Wound Preparation Ulcer Cleansing: Rinsed/Irrigated with Saline Topical Anesthetic Applied: Other: lidocaine 4%, Treatment Notes Wound #3 (Right Elbow) 1. Cleansed with: Clean wound with Normal Saline 2. Anesthetic Topical Lidocaine 4% cream to wound bed prior to debridement 4. Dressing Applied: Other dressing (specify in notes) 5. Secondary Dressing Applied Bordered Foam Dressing Aquacel Ag 7. Secured with Recruitment consultant) Signed: 05/11/2016 4:56:10 PM By: Regan Lemming BSN, RN Entered By: Regan Lemming on 05/11/2016 13:15:28 EVERLEE, KEITER ([id number]) -------------------------------------------------------------------------------- Vitals Details Patient Name: [name] [name] Date of Service: 05/11/2016 12:45 PM Medical Record Number: [id number] Patient Account Number: [id number] Date of Birth/Sex: Dec 06, 1928 (82 y.o. Female) Treating RN: Afful, RN, BSN, [place] Primary Care Physician: Clayborn Bigness Other Clinician: Referring Physician: Clayborn Bigness Treating Physician/Extender: Frann Rider in Treatment: 1 Vital Signs Time Taken: 13:01 Temperature (F): 97.5 Height (in): 60 Pulse (bpm): 72 Weight (lbs): 122 Respiratory Rate (breaths/min): 18 Body Mass Index (BMI): 23.8 Blood Pressure (mmHg): 148/53 Reference Range: 80 - 120 mg / dl Electronic  Signature(s) Signed: 05/11/2016 4:56:10 PM By: Regan Lemming BSN, RN Entered By: Regan Lemming on 05/11/2016 13:01:29

## 2016-05-18 ENCOUNTER — Encounter: Payer: Medicare Other | Admitting: Nurse Practitioner

## 2016-05-18 DIAGNOSIS — C44722 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, including hip: Secondary | ICD-10-CM | POA: Diagnosis not present

## 2016-05-19 NOTE — Progress Notes (Signed)
PAETYNN, FEURTADO ([id number]) Visit Report for 05/18/2016 Arrival Information Details Patient Name: [name], [name] Date of Service: 05/18/2016 12:30 PM Medical Record Number: [id number] Patient Account Number: [id number] Date of Birth/Sex: [birth year] (82 y.o. Female) Treating RN: Montey Hora Primary Care Physician: Clayborn Bigness Other Clinician: Referring Physician: Clayborn Bigness Treating Physician/Extender: Cathie Olden in Treatment: 2 Visit Information History Since Last Visit Added or deleted any medications: No Patient Arrived: Walker Any new allergies or adverse reactions: No Arrival Time: 12:46 Had a fall or experienced change in No Accompanied By: dtr activities of daily living that may affect Transfer Assistance: None risk of falls: Patient Identification Verified: Yes Signs or symptoms of abuse/neglect since last No Secondary Verification Process Completed: Yes visito Patient Requires Transmission-Based No Hospitalized since last visit: No Precautions: Pain Present Now: Yes Patient Has Alerts: No Electronic Signature(s) Signed: 05/18/2016 5:33:27 PM By: Montey Hora Entered By: Montey Hora on 05/18/2016 12:49:35 [name] ([id number]) -------------------------------------------------------------------------------- Encounter Discharge Information Details Patient Name: [name] Date of Service: 05/18/2016 12:30 PM Medical Record Number: [id number] Patient Account Number: [id number] Date of Birth/Sex: 12-01-1928 (82 y.o. Female) Treating RN: Montey Hora Primary Care Physician: Clayborn Bigness Other Clinician: Referring Physician: Clayborn Bigness Treating Physician/Extender: Cathie Olden in Treatment: 2 Encounter Discharge Information Items Discharge Pain Level: 0 Discharge Condition: Stable Ambulatory Status: Walker Discharge Destination: Home Transportation: Private Auto Accompanied By: dtr Schedule Follow-up Appointment:  Yes Medication Reconciliation completed and provided to Patient/Care No Sanjuanita Condrey: Provided on Clinical Summary of Care: 05/18/2016 Form Type Recipient Paper Patient LP Electronic Signature(s) Signed: 05/18/2016 2:21:52 PM By: Ruthine Dose Entered By: Ruthine Dose on 05/18/2016 14:21:51 [name] ([id number]) -------------------------------------------------------------------------------- Lower Extremity Assessment Details Patient Name: [name] Date of Service: 05/18/2016 12:30 PM Medical Record Number: [id number] Patient Account Number: [id number] Date of Birth/Sex: 06-17-1928 (82 y.o. Female) Treating RN: Montey Hora Primary Care Physician: Clayborn Bigness Other Clinician: Referring Physician: Clayborn Bigness Treating Physician/Extender: Lawanda Cousins Weeks in Treatment: 2 Edema Assessment Assessed: [Left: No] [Right: No] Edema: [Left: Yes] [Right: Yes] Calf Left: Right: Point of Measurement: 30 cm From Medial Instep cm cm Ankle Left: Right: Point of Measurement: 10 cm From Medial Instep cm cm Vascular Assessment Pulses: Dorsalis Pedis Palpable: [Left:Yes] [Right:Yes] Posterior Tibial Extremity colors, hair growth, and conditions: Extremity Color: [Left:Mottled] [Right:Mottled] Hair Growth on Extremity: [Left:No] [Right:No] Temperature of Extremity: [Left:Warm] [Right:Warm] Capillary Refill: [Left:< 3 seconds] [Right:< 3 seconds] Electronic Signature(s) Signed: 05/18/2016 5:33:27 PM By: Montey Hora Entered By: Montey Hora on 05/18/2016 13:37:56 Neto, [name] ([id number]) -------------------------------------------------------------------------------- Multi Wound Chart Details Patient Name: [name] Date of Service: 05/18/2016 12:30 PM Medical Record Number: [id number] Patient Account Number: [id number] Date of Birth/Sex: 07/21/1928 (82 y.o. Female) Treating RN: Montey Hora Primary Care Physician: Clayborn Bigness Other  Clinician: Referring Physician: Clayborn Bigness Treating Physician/Extender: Cathie Olden in Treatment: 2 Vital Signs Height(in): 60 Pulse(bpm): 69 Weight(lbs): 122 Blood Pressure 157/56 (mmHg): Body Mass Index(BMI): 24 Temperature(F): 97.8 Respiratory Rate 18 (breaths/min): Photos: Wound Location: Right Lower Leg - Left Foot - Dorsal Right Elbow Proximal Wounding Event: Trauma Trauma Trauma Primary Etiology: Trauma, Other Trauma, Other Trauma, Other Comorbid History: Asthma, Hypertension Asthma, Hypertension Asthma, Hypertension Date Acquired: 04/21/2016 04/21/2016 04/21/2016 Weeks of Treatment: 2 2 2  Wound Status: Open Open Open Clustered Wound: Yes No No Pending Amputation on Yes No No Presentation: Measurements L x W x D 1.9x2.1x0.1 1.2x0.3x0.1 0.3x0.3x0.1 (cm) Area (cm) : 3.134 0.283 0.071 Volume (cm) : 0.313 0.028 0.007 % Reduction in Area: 98.40% 76.00%  95.20% % Reduction in Volume: 99.20% 76.30% 95.20% Tunneling: No No No Classification: Full Thickness Without Partial Thickness Partial Thickness Exposed Support Structures Exudate Amount: Large Large Large Exudate Type: Purulent Serous Serous Leh, Lucindia ([id number]) Exudate Color: yellow, brown, green amber amber Wound Margin: Flat and Intact Flat and Intact Flat and Intact Granulation Amount: None Present (0%) None Present (0%) None Present (0%) Granulation Quality: N/A N/A N/A Necrotic Amount: Large (67-100%) Large (67-100%) Large (67-100%) Necrotic Tissue: Eschar, Adherent Slough Eschar, Adherent Dawson Exposed Structures: Fascia: No Fascia: No Fascia: No Fat: No Fat: No Fat: No Tendon: No Tendon: No Tendon: No Muscle: No Muscle: No Muscle: No Joint: No Joint: No Joint: No Bone: No Bone: No Bone: No Limited to Skin Limited to Skin Limited to Skin Breakdown Breakdown Breakdown Epithelialization: Small (1-33%) None None Debridement: N/A Open  Wound/Selective N/A [id number]) - Selective Pre-procedure N/A 13:34 N/A Verification/Time Out Taken: Pain Control: N/A Lidocaine 4% Topical N/A Solution Tissue Debrided: N/A Fibrin/Slough N/A Level: N/A Non-Viable Tissue N/A Debridement Area (sq N/A 0.36 N/A cm): Instrument: N/A Curette N/A Bleeding: N/A Minimum N/A Hemostasis Achieved: N/A Pressure N/A Procedural Pain: N/A 0 N/A Post Procedural Pain: N/A 0 N/A Debridement Treatment N/A Procedure was tolerated N/A Response: well Post Debridement N/A 1.2x0.3x0.1 N/A Measurements L x W x D (cm) Post Debridement N/A 0.028 N/A Volume: (cm) Periwound Skin Texture: Edema: Yes Edema: No Edema: Yes Excoriation: No Excoriation: No Excoriation: No Induration: No Induration: No Induration: No Callus: No Callus: No Callus: No Crepitus: No Crepitus: No Crepitus: No Fluctuance: No Fluctuance: No Fluctuance: No Friable: No Friable: No Friable: No Rash: No Rash: No Rash: No Scarring: No Scarring: No Scarring: No Periwound Skin Maceration: Yes Moist: Yes Moist: Yes Moisture: Moist: Yes Maceration: No Maceration: No Dry/Scaly: No Dry/Scaly: No Dry/Scaly: No Demary, Kista ([id number]) Periwound Skin Color: Erythema: Yes Erythema: Yes Erythema: Yes Atrophie Blanche: No Atrophie Blanche: No Atrophie Blanche: No Cyanosis: No Cyanosis: No Cyanosis: No Ecchymosis: No Ecchymosis: No Ecchymosis: No Hemosiderin Staining: No Hemosiderin Staining: No Hemosiderin Staining: No Mottled: No Mottled: No Mottled: No Pallor: No Pallor: No Pallor: No Rubor: No Rubor: No Rubor: No Erythema Location: Circumferential Circumferential Circumferential Temperature: No Abnormality No Abnormality No Abnormality Tenderness on Yes Yes Yes Palpation: Wound Preparation: Ulcer Cleansing: Ulcer Cleansing: Ulcer Cleansing: Rinsed/Irrigated with Rinsed/Irrigated with Rinsed/Irrigated with Saline Saline Saline Topical  Anesthetic Topical Anesthetic Topical Anesthetic Applied: Other: lidocaine Applied: Other: lidocaine Applied: Other: lidocaine 4% 4% 4% Procedures Performed: N/A Debridement N/A Wound Number: 4 N/A N/A Photos: N/A N/A Wound Location: Right Lower Leg - Anterior N/A N/A Wounding Event: Trauma N/A N/A Primary Etiology: Trauma, Other N/A N/A Comorbid History: Asthma, Hypertension N/A N/A Date Acquired: 04/21/2016 N/A N/A Weeks of Treatment: 0 N/A N/A Wound Status: Open N/A N/A Clustered Wound: No N/A N/A Pending Amputation on No N/A N/A Presentation: Measurements L x W x D 11.5x3.2x1.7 N/A N/A (cm) Area (cm) : 28.903 N/A N/A Volume (cm) : 49.135 N/A N/A % Reduction in Area: 0.00% N/A N/A % Reduction in Volume: 0.00% N/A N/A Position 1 (o'clock): 12 Maximum Distance 1 1.5 (cm): Tunneling: Yes N/A N/A Gargan, Lexany ([id number]) Classification: Full Thickness Without N/A N/A Exposed Support Structures Exudate Amount: Large N/A N/A Exudate Type: Serous N/A N/A Exudate Color: amber N/A N/A Wound Margin: Flat and Intact N/A N/A Granulation Amount: Small (1-33%) N/A N/A Granulation Quality: Red N/A N/A Necrotic Amount: Large (67-100%) N/A N/A Necrotic Tissue: Eschar, Adherent  Slough N/A N/A Exposed Structures: Fascia: No N/A N/A Fat: No Tendon: No Muscle: No Joint: No Bone: No Limited to Skin Breakdown Epithelialization: None N/A N/A Debridement: Debridement [id number] N/A N/A [id number]) Pre-procedure 13:37 N/A N/A Verification/Time Out Taken: Pain Control: Lidocaine 4% Topical N/A N/A Solution Tissue Debrided: Fibrin/Slough, N/A N/A Subcutaneous Level: Skin/Subcutaneous N/A N/A Tissue Debridement Area (sq 36.8 N/A N/A cm): Instrument: Curette N/A N/A Bleeding: Minimum N/A N/A Hemostasis Achieved: Pressure N/A N/A Procedural Pain: 0 N/A N/A Post Procedural Pain: 0 N/A N/A Debridement Treatment Procedure was tolerated N/A N/A Response: well Post Debridement  11.5x3.2x1.8 N/A N/A Measurements L x W x D (cm) Post Debridement 52.025 N/A N/A Volume: (cm) Periwound Skin Texture: Edema: No N/A N/A Excoriation: No Induration: No Callus: No Crepitus: No Eimer, Gae ([id number]) Fluctuance: No Friable: No Rash: No Scarring: No Periwound Skin Moist: Yes N/A N/A Moisture: Maceration: No Dry/Scaly: No Periwound Skin Color: Erythema: Yes N/A N/A Atrophie Blanche: No Cyanosis: No Ecchymosis: No Hemosiderin Staining: No Mottled: No Pallor: No Rubor: No Erythema Location: Circumferential N/A N/A Temperature: N/A N/A N/A Tenderness on Yes N/A N/A Palpation: Wound Preparation: Ulcer Cleansing: N/A N/A Rinsed/Irrigated with Saline Topical Anesthetic Applied: Other: lidocaine 4% Procedures Performed: Debridement N/A N/A Treatment Notes Electronic Signature(s) Signed: 05/18/2016 2:53:31 PM By: Rene Kocher, NP, Leah Entered By: Rene Kocher, NP, Leah on 05/18/2016 14:53:30 KODA, BLEE ([id number]) -------------------------------------------------------------------------------- Multi-Disciplinary Care Plan Details Patient Name: [name] Date of Service: 05/18/2016 12:30 PM Medical Record Number: [id number] Patient Account Number: [id number] Date of Birth/Sex: [birth year] (82 y.o. Female) Treating RN: Montey Hora Primary Care Physician: Clayborn Bigness Other Clinician: Referring Physician: Clayborn Bigness Treating Physician/Extender: Cathie Olden in Treatment: 2 Active Inactive Abuse / Safety / Falls / [facility] Management Nursing Diagnoses: Impaired physical mobility Potential for falls Goals: Patient will remain injury free Date Initiated: 05/04/2016 Goal Status: Active Interventions: Assess fall risk on admission and as needed Notes: Orientation to the Wound Care Program Nursing Diagnoses: Knowledge deficit related to the wound healing center program Goals: Patient/caregiver will verbalize understanding of  the [facility] Date Initiated: 05/04/2016 Goal Status: Active Interventions: Provide education on orientation to the wound center Notes: Pain, Acute or Chronic Nursing Diagnoses: Pain, acute or chronic: actual or potential Goals: Patient/caregiver will verbalize adequate pain control between visits CADENCE, LAMAY ([id number]) Date Initiated: 05/04/2016 Goal Status: Active Interventions: Complete pain assessment as per visit requirements Notes: Wound/Skin Impairment Nursing Diagnoses: Impaired tissue integrity Goals: Patient/caregiver will verbalize understanding of skin care regimen Date Initiated: 05/04/2016 Goal Status: Active Ulcer/skin breakdown will have a volume reduction of 30% by week 4 Date Initiated: 05/04/2016 Goal Status: Active Ulcer/skin breakdown will have a volume reduction of 50% by week 8 Date Initiated: 05/04/2016 Goal Status: Active Ulcer/skin breakdown will have a volume reduction of 80% by week 12 Date Initiated: 05/04/2016 Goal Status: Active Ulcer/skin breakdown will heal within 14 weeks Date Initiated: 05/04/2016 Goal Status: Active Interventions: Assess patient/caregiver ability to obtain necessary supplies Assess patient/caregiver ability to perform ulcer/skin care regimen upon admission and as needed Assess ulceration(s) every visit Notes: Electronic Signature(s) Signed: 05/18/2016 5:33:27 PM By: Montey Hora Entered By: Montey Hora on 05/18/2016 13:38:02 [name] ([id number]) -------------------------------------------------------------------------------- Pain Assessment Details Patient Name: [name] Date of Service: 05/18/2016 12:30 PM Medical Record Number: [id number] Patient Account Number: [id number] Date of Birth/Sex: June 12, 1928 (82 y.o. Female) Treating RN: Montey Hora Primary Care Physician: Clayborn Bigness Other Clinician: Referring Physician: Clayborn Bigness Treating Physician/Extender:  Cathie Olden  in Treatment: 2 Active Problems Location of Pain Severity and Description of Pain Patient Has Paino Yes Site Locations Pain Management and Medication Current Pain Management: Notes Topical or injectable lidocaine is offered to patient for acute pain when surgical debridement is performed. If needed, Patient is instructed to use over the counter pain medication for the following 24-48 hours after debridement. Wound care MDs do not prescribed pain medications. Patient has chronic pain or uncontrolled pain. Patient has been instructed to make an appointment with their Primary Care Physician for pain management. Electronic Signature(s) Signed: 05/18/2016 5:33:27 PM By: Montey Hora Entered By: Montey Hora on 05/18/2016 12:49:57 [name] ([id number]) -------------------------------------------------------------------------------- Patient/Caregiver Education Details Patient Name: [name] Date of Service: 05/18/2016 12:30 PM Medical Record Number: [id number] Patient Account Number: [id number] Date of Birth/Gender: 01/09/1929 (82 y.o. Female) Treating RN: Montey Hora Primary Care Physician: Clayborn Bigness Other Clinician: Referring Physician: Clayborn Bigness Treating Physician/Extender: Cathie Olden in Treatment: 2 Education Assessment Education Provided To: Patient and Caregiver Education Topics Provided Wound/Skin Impairment: Handouts: Other: wound care as ordered Methods: Demonstration, Explain/Verbal Responses: State content correctly Electronic Signature(s) Signed: 05/18/2016 5:33:27 PM By: Montey Hora Entered By: Montey Hora on 05/18/2016 13:45:55 Buccheri, [name] ([id number]) -------------------------------------------------------------------------------- Wound Assessment Details Patient Name: [name] Date of Service: 05/18/2016 12:30 PM Medical Record Number: [id number] Patient Account Number:  [id number] Date of Birth/Sex: 1928-06-07 (82 y.o. Female) Treating RN: Montey Hora Primary Care Physician: Clayborn Bigness Other Clinician: Referring Physician: Clayborn Bigness Treating Physician/Extender: Cathie Olden in Treatment: 2 Wound Status Wound Number: 1 Primary Etiology: Trauma, Other Wound Location: Right Lower Leg - Proximal Wound Status: Open Wounding Event: Trauma Comorbid History: Asthma, Hypertension Date Acquired: 04/21/2016 Weeks Of Treatment: 2 Clustered Wound: Yes Pending Amputation On Presentation Photos Wound Measurements Length: (cm) 1.9 Width: (cm) 2.1 Depth: (cm) 0.1 Area: (cm) 3.134 Volume: (cm) 0.313 % Reduction in Area: 98.4% % Reduction in Volume: 99.2% Epithelialization: Small (1-33%) Tunneling: No Undermining: No Wound Description Full Thickness Without Exposed Foul Odor Af Classification: Support Structures Wound Margin: Flat and Intact Exudate Large Amount: Exudate Type: Purulent Exudate Color: yellow, brown, green ter Cleansing: No Wound Bed Granulation Amount: None Present (0%) Exposed Structure Necrotic Amount: Large (67-100%) Fascia Exposed: No Nasby, Tyrell ([id number]) Necrotic Quality: Eschar, Adherent Slough Fat Layer Exposed: No Tendon Exposed: No Muscle Exposed: No Joint Exposed: No Bone Exposed: No Limited to Skin Breakdown Periwound Skin Texture Texture Color No Abnormalities Noted: No No Abnormalities Noted: No Callus: No Atrophie Blanche: No Crepitus: No Cyanosis: No Excoriation: No Ecchymosis: No Fluctuance: No Erythema: Yes Friable: No Erythema Location: Circumferential Induration: No Hemosiderin Staining: No Localized Edema: Yes Mottled: No Rash: No Pallor: No Scarring: No Rubor: No Moisture Temperature / Pain No Abnormalities Noted: No Temperature: No Abnormality Dry / Scaly: No Tenderness on Palpation: Yes Maceration: Yes Moist: Yes Wound Preparation Ulcer Cleansing:  Rinsed/Irrigated with Saline Topical Anesthetic Applied: Other: lidocaine 4%, Electronic Signature(s) Signed: 05/18/2016 5:33:27 PM By: Montey Hora Entered By: Montey Hora on 05/18/2016 13:48:48 Lindeman, [name] ([id number]) -------------------------------------------------------------------------------- Wound Assessment Details Patient Name: [name] Date of Service: 05/18/2016 12:30 PM Medical Record Number: [id number] Patient Account Number: [id number] Date of Birth/Sex: [birth year] (82 y.o. Female) Treating RN: Montey Hora Primary Care Physician: Clayborn Bigness Other Clinician: Referring Physician: Clayborn Bigness Treating Physician/Extender: Cathie Olden in Treatment: 2 Wound Status Wound Number: 2 Primary Etiology: Trauma, Other Wound Location: Left Foot - Dorsal Wound Status: Open Wounding Event: Trauma Comorbid History: Asthma, Hypertension Date Acquired:  04/21/2016 Weeks Of Treatment: 2 Clustered Wound: No Photos Wound Measurements Length: (cm) 1.2 Width: (cm) 0.3 Depth: (cm) 0.1 Area: (cm) 0.283 Volume: (cm) 0.028 % Reduction in Area: 76% % Reduction in Volume: 76.3% Epithelialization: None Tunneling: No Undermining: No Wound Description Classification: Partial Thickness Wound Margin: Flat and Intact Exudate Amount: Large Exudate Type: Serous Exudate Color: amber Foul Odor After Cleansing: No Wound Bed Granulation Amount: None Present (0%) Exposed Structure Necrotic Amount: Large (67-100%) Fascia Exposed: No Necrotic Quality: Eschar, Adherent Slough Fat Layer Exposed: No Tendon Exposed: No Muscle Exposed: No Attridge, Lauria ([id number]) Joint Exposed: No Bone Exposed: No Limited to Skin Breakdown Periwound Skin Texture Texture Color No Abnormalities Noted: No No Abnormalities Noted: No Callus: No Atrophie Blanche: No Crepitus: No Cyanosis: No Excoriation: No Ecchymosis: No Fluctuance: No Erythema: Yes Friable:  No Erythema Location: Circumferential Induration: No Hemosiderin Staining: No Localized Edema: No Mottled: No Rash: No Pallor: No Scarring: No Rubor: No Moisture Temperature / Pain No Abnormalities Noted: No Temperature: No Abnormality Dry / Scaly: No Tenderness on Palpation: Yes Maceration: No Moist: Yes Wound Preparation Ulcer Cleansing: Rinsed/Irrigated with Saline Topical Anesthetic Applied: Other: lidocaine 4%, Electronic Signature(s) Signed: 05/18/2016 5:33:27 PM By: Montey Hora Entered By: Montey Hora on 05/18/2016 13:49:17 Svec, [name] ([id number]) -------------------------------------------------------------------------------- Wound Assessment Details Patient Name: [name] Date of Service: 05/18/2016 12:30 PM Medical Record Number: [id number] Patient Account Number: [id number] Date of Birth/Sex: 09-05-1928 (82 y.o. Female) Treating RN: Montey Hora Primary Care Physician: Clayborn Bigness Other Clinician: Referring Physician: Clayborn Bigness Treating Physician/Extender: Lawanda Cousins Weeks in Treatment: 2 Wound Status Wound Number: 3 Primary Etiology: Trauma, Other Wound Location: Right Elbow Wound Status: Open Wounding Event: Trauma Comorbid History: Asthma, Hypertension Date Acquired: 04/21/2016 Weeks Of Treatment: 2 Clustered Wound: No Photos Wound Measurements Length: (cm) 0.3 Width: (cm) 0.3 Depth: (cm) 0.1 Area: (cm) 0.071 Volume: (cm) 0.007 % Reduction in Area: 95.2% % Reduction in Volume: 95.2% Epithelialization: None Tunneling: No Undermining: No Wound Description Classification: Partial Thickness Wound Margin: Flat and Intact Exudate Amount: Large Exudate Type: Serous Exudate Color: amber Foul Odor After Cleansing: No Wound Bed Granulation Amount: None Present (0%) Exposed Structure Necrotic Amount: Large (67-100%) Fascia Exposed: No Necrotic Quality: Eschar, Adherent Slough Fat Layer Exposed: No Tendon  Exposed: No Muscle Exposed: No Joos, Mechell ([id number]) Joint Exposed: No Bone Exposed: No Limited to Skin Breakdown Periwound Skin Texture Texture Color No Abnormalities Noted: No No Abnormalities Noted: No Callus: No Atrophie Blanche: No Crepitus: No Cyanosis: No Excoriation: No Ecchymosis: No Fluctuance: No Erythema: Yes Friable: No Erythema Location: Circumferential Induration: No Hemosiderin Staining: No Localized Edema: Yes Mottled: No Rash: No Pallor: No Scarring: No Rubor: No Moisture Temperature / Pain No Abnormalities Noted: No Temperature: No Abnormality Dry / Scaly: No Tenderness on Palpation: Yes Maceration: No Moist: Yes Wound Preparation Ulcer Cleansing: Rinsed/Irrigated with Saline Topical Anesthetic Applied: Other: lidocaine 4%, Electronic Signature(s) Signed: 05/18/2016 5:33:27 PM By: Montey Hora Entered By: Montey Hora on 05/18/2016 13:49:36 Gravois, Genice ([id number]) -------------------------------------------------------------------------------- Wound Assessment Details Patient Name: [name] Date of Service: 05/18/2016 12:30 PM Medical Record Number: [id number] Patient Account Number: [id number] Date of Birth/Sex: Jun 29, 1928 (82 y.o. Female) Treating RN: Montey Hora Primary Care Physician: Clayborn Bigness Other Clinician: Referring Physician: Clayborn Bigness Treating Physician/Extender: Lawanda Cousins Weeks in Treatment: 2 Wound Status Wound Number: 4 Primary Etiology: Trauma, Other Wound Location: Right Lower Leg - Anterior Wound Status: Open Wounding Event: Trauma Comorbid History: Asthma, Hypertension Date Acquired: 04/21/2016 Weeks Of Treatment: 0  Clustered Wound: No Photos Wound Measurements Length: (cm) 11.5 Width: (cm) 3.2 Depth: (cm) 1.7 Area: (cm) 28.903 Volume: (cm) 49.135 % Reduction in Area: 0% % Reduction in Volume: 0% Epithelialization: None Tunneling: Yes Position (o'clock):  12 Maximum Distance: (cm) 1.5 Undermining: No Wound Description Full Thickness Without Exposed Classification: Support Structures Wound Margin: Flat and Intact Exudate Large Amount: Exudate Type: Serous Exudate Color: amber Foul Odor After Cleansing: No Wound Bed Clyatt, Asli ([id number]) Granulation Amount: Small (1-33%) Exposed Structure Granulation Quality: Red Fascia Exposed: No Necrotic Amount: Large (67-100%) Fat Layer Exposed: No Necrotic Quality: Eschar, Adherent Slough Tendon Exposed: No Muscle Exposed: No Joint Exposed: No Bone Exposed: No Limited to Skin Breakdown Periwound Skin Texture Texture Color No Abnormalities Noted: No No Abnormalities Noted: No Callus: No Atrophie Blanche: No Crepitus: No Cyanosis: No Excoriation: No Ecchymosis: No Fluctuance: No Erythema: Yes Friable: No Erythema Location: Circumferential Induration: No Hemosiderin Staining: No Localized Edema: No Mottled: No Rash: No Pallor: No Scarring: No Rubor: No Moisture Temperature / Pain No Abnormalities Noted: No Tenderness on Palpation: Yes Dry / Scaly: No Maceration: No Moist: Yes Wound Preparation Ulcer Cleansing: Rinsed/Irrigated with Saline Topical Anesthetic Applied: Other: lidocaine 4%, Electronic Signature(s) Signed: 05/18/2016 5:33:27 PM By: Montey Hora Entered By: Montey Hora on 05/18/2016 13:49:57 Livecchi, [name] ([id number]) -------------------------------------------------------------------------------- [place] Details Patient Name: [name] Date of Service: 05/18/2016 12:30 PM Medical Record Number: [id number] Patient Account Number: [id number] Date of Birth/Sex: [birth year] (82 y.o. Female) Treating RN: Montey Hora Primary Care Physician: Clayborn Bigness Other Clinician: Referring Physician: Clayborn Bigness Treating Physician/Extender: Cathie Olden in Treatment: 2 Vital Signs Time Taken: 12:50 Temperature (F): 97.8 Height  (in): 60 Pulse (bpm): 69 Weight (lbs): 122 Respiratory Rate (breaths/min): 18 Body Mass Index (BMI): 23.8 Blood Pressure (mmHg): 157/56 Reference Range: 80 - 120 mg / dl Electronic Signature(s) Signed: 05/18/2016 5:33:27 PM By: Montey Hora Entered By: Montey Hora on 05/18/2016 12:52:09

## 2016-05-20 NOTE — Progress Notes (Signed)
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 appearance did not exhibit: Callus, Crepitus, Excoriation, Fluctuance, Friable, Induration, Rash, Scarring, Dry/Scaly, Atrophie Blanche, Cyanosis, Ecchymosis, Hemosiderin Staining, Mottled, Pallor, Rubor. The surrounding wound skin color is noted with erythema which is circumferential. Periwound temperature was noted as No Abnormality. The periwound has tenderness on palpation. Wound #2 status is Open. Original cause of wound was Trauma. The wound is located on the Left,Dorsal Foot. The wound measures 1.2cm length x 0.3cm width x 0.1cm depth; 0.283cm^2 area and 0.028cm^3 volume. The wound is limited to skin breakdown. There is no tunneling or undermining noted. There is a large amount of serous drainage noted. The wound margin is flat and intact. There is  no granulation within the wound  bed. There is a large (67-100%) amount of necrotic tissue within the wound bed including Eschar and Adherent Slough. The periwound skin appearance exhibited: Moist, Erythema. The periwound skin Jeangilles, Akanksha ([id number]) appearance did not exhibit: Callus, Crepitus, Excoriation, Fluctuance, Friable, Induration, Localized Edema, Rash, Scarring, Dry/Scaly, Maceration, Atrophie Blanche, Cyanosis, Ecchymosis, Hemosiderin Staining, Mottled, Pallor, Rubor. The surrounding wound skin color is noted with erythema which is circumferential. Periwound temperature was noted as No Abnormality. The periwound has tenderness on palpation. Wound #3 status is Open. Original cause of wound was Trauma. The wound is located on the Right Elbow. The wound measures 0.3cm length x 0.3cm width x 0.1cm depth; 0.071cm^2 area and 0.007cm^3 volume. The wound is limited to skin breakdown. There is no tunneling or undermining noted. There is a large amount of serous drainage noted. The wound margin is flat and intact. There is no granulation within the wound bed. There is a large (67-100%) amount of necrotic tissue within the wound bed including Eschar and Adherent Slough. The periwound skin appearance exhibited: Localized Edema, Moist, Erythema. The periwound skin appearance did not exhibit: Callus, Crepitus, Excoriation, Fluctuance, Friable, Induration, Rash, Scarring, Dry/Scaly, Maceration, Atrophie Blanche, Cyanosis, Ecchymosis, Hemosiderin Staining, Mottled, Pallor, Rubor. The surrounding wound skin color is noted with erythema which is circumferential. Periwound temperature was noted as No Abnormality. The periwound has tenderness on palpation. Wound #4 status is Open. Original cause of wound was Trauma. The wound is located on the Right,Anterior Lower Leg. The wound measures 11.5cm length x 3.2cm width x 1.7cm depth; 28.903cm^2 area and 49.135cm^3 volume. The wound is limited to skin breakdown. There is no undermining  noted, however, there is tunneling at 12:00 with a maximum distance of 1.5cm. There is a large amount of serous drainage noted. The wound margin is flat and intact. There is small (1-33%) red granulation within the wound bed. There is a large (67-100%) amount of necrotic tissue within the wound bed including Eschar and Adherent Slough. The periwound skin appearance exhibited: Moist, Erythema. The periwound skin appearance did not exhibit: Callus, Crepitus, Excoriation, Fluctuance, Friable, Induration, Localized Edema, Rash, Scarring, Dry/Scaly, Maceration, Atrophie Blanche, Cyanosis, Ecchymosis, Hemosiderin Staining, Mottled, Pallor, Rubor. The surrounding wound skin color is noted with erythema which is circumferential. The periwound has tenderness on palpation. Assessment Active Problems ICD-10 S51.011A - Laceration without foreign body of right elbow, initial encounter S81.812A - Laceration without foreign body, left lower leg, initial encounter S81.811A - Laceration without foreign body, right lower leg, initial encounter L97.312 - Non-pressure chronic ulcer of right ankle with fat layer exposed L97.522 - Non-pressure chronic ulcer of other part of left foot with fat layer exposed C44.722 - 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, including hip C44.729 - Squamous cell carcinoma of skin of left lower limb, including hip Procedures Haddon, Wynette ([id number]) Wound #2 Wound #2 is a Trauma, Other located on the Left,Dorsal Foot . There was a Non-Viable Tissue Open Wound/Selective [id number]) debridement with total area of 0.36 sq cm performed by Lawanda Cousins, NP. with the following instrument(s): Curette to remove Non-Viable tissue/material including Fibrin/Slough after achieving pain control using Lidocaine 4% Topical Solution. A time out was conducted at 13:34, prior to the start of the procedure. A Minimum amount of bleeding was controlled with Pressure. The procedure  was tolerated well with a pain level of 0 throughout and a pain level of 0 following the procedure. Post Debridement Measurements: 1.2cm length x 0.3cm width x  0.1cm depth; 0.028cm^3 volume. Character of Wound/Ulcer Post Debridement is improved. Severity of Tissue Post Debridement is: Limited to breakdown of skin. Post procedure Diagnosis Wound #2: Same as Pre-Procedure Wound #4 Wound #4 is a Trauma, Other located on the Right,Anterior Lower Leg . There was a Skin/Subcutaneous Tissue Debridement [id number]) debridement with total area of 36.8 sq cm performed by Lawanda Cousins, NP. with the following instrument(s): Curette to remove Viable and Non-Viable tissue/material including Fibrin/Slough and Subcutaneous after achieving pain control using Lidocaine 4% Topical Solution. A time out was conducted at 13:37, prior to the start of the procedure. A Minimum amount of bleeding was controlled with Pressure. The procedure was tolerated well with a pain level of 0 throughout and a pain level of 0 following the procedure. Post Debridement Measurements: 11.5cm length x 3.2cm width x 1.8cm depth; 52.025cm^3 volume. Character of Wound/Ulcer Post Debridement is improved. Severity of Tissue Post Debridement is: Fat layer exposed. Post procedure Diagnosis Wound #4: Same as Pre-Procedure Plan Wound Cleansing: Wound #1 Right,Proximal Lower Leg: Clean wound with Normal Saline. May Shower, gently pat wound dry prior to applying new dressing. Wound #2 Left,Dorsal Foot: Clean wound with Normal Saline. May Shower, gently pat wound dry prior to applying new dressing. Wound #3 Right Elbow: Clean wound with Normal Saline. May Shower, gently pat wound dry prior to applying new dressing. Wound #4 Right,Anterior Lower Leg: Clean wound with Normal Saline. May Shower, gently pat wound dry prior to applying new dressing. Anesthetic: Wound #1 Right,Proximal Lower Leg: Topical Lidocaine 4% cream applied to wound  bed prior to debridement Dicioccio, Finola ([id number]) Wound #2 Left,Dorsal Foot: Topical Lidocaine 4% cream applied to wound bed prior to debridement Wound #3 Right Elbow: Topical Lidocaine 4% cream applied to wound bed prior to debridement Wound #4 Right,Anterior Lower Leg: Topical Lidocaine 4% cream applied to wound bed prior to debridement Primary Wound Dressing: Wound #1 Right,Proximal Lower Leg: Aquacel Ag - on the proximal part of the wound. Wound #2 Left,Dorsal Foot: Aquacel Ag - or equivalent Wound #4 Right,Anterior Lower Leg: Santyl Ointment Plain packing gauze Secondary Dressing: Wound #1 Right,Proximal Lower Leg: ABD and Kerlix/Conform Wound #2 Left,Dorsal Foot: ABD and Kerlix/Conform Wound #3 Right Elbow: Boardered Foam Dressing - or other cover bandage Wound #4 Right,Anterior Lower Leg: ABD and Kerlix/Conform Dressing Change Frequency: Wound #1 Right,Proximal Lower Leg: Change dressing every day. Wound #2 Left,Dorsal Foot: Change dressing every day. Wound #4 Right,Anterior Lower Leg: Change dressing every day. Wound #3 Right Elbow: Other: - twice weekly Follow-up Appointments: Wound #1 Right,Proximal Lower Leg: Return Appointment in 1 week. Wound #2 Left,Dorsal Foot: Return Appointment in 1 week. Wound #3 Right Elbow: Return Appointment in 1 week. Wound #4 Right,Anterior Lower Leg: Return Appointment in 1 week. Edema Control: Wound #1 Right,Proximal Lower Leg: Elevate legs to the level of the heart and pump ankles as often as possible Wound #2 Left,Dorsal Foot: Elevate legs to the level of the heart and pump ankles as often as possible Wound #4 Right,Anterior Lower Leg: Elevate legs to the level of the heart and pump ankles as often as possible Additional Orders / Instructions: Wound #1 Right,Proximal Lower Leg: Increase protein intake. Haughton, Vail ([id number]) Wound #2 Left,Dorsal Foot: Increase protein intake. Wound #3 Right  Elbow: Increase protein intake. Wound #4 Right,Anterior Lower Leg: Increase protein intake. Home Health: Wound #1 Right,Proximal Lower Leg: [place] Nurse may visit PRN to address patient s wound care needs. FACE TO FACE ENCOUNTER:  MEDICARE and MEDICAID PATIENTS: I certify that this patient is under my care and that I had a face-to-face encounter that meets the physician face-to-face encounter requirements with this patient on this date. The encounter with the patient was in whole or in part for the following MEDICAL CONDITION: (primary reason for [place]) MEDICAL NECESSITY: I certify, that based on my findings, NURSING services are a medically necessary home health service. HOME BOUND STATUS: I certify that my clinical findings support that this patient is homebound (i.e., Due to illness or injury, pt requires aid of supportive devices such as crutches, cane, wheelchairs, walkers, the use of special transportation or the assistance of another person to leave their place of residence. There is a normal inability to leave the home and doing so requires considerable and taxing effort. Other absences are for medical reasons / religious services and are infrequent or of short duration when for other reasons). If current dressing causes regression in wound condition, may D/C ordered dressing product/s and apply Normal Saline Moist Dressing daily until next [place] / Other MD appointment. [place] of regression in wound condition at [phone]. Please direct any NON-WOUND related issues/requests for orders to patient's Primary Care Physician Wound #2 Left,Dorsal Foot: [place] Nurse may visit PRN to address patient s wound care needs. FACE TO FACE ENCOUNTER: MEDICARE and MEDICAID PATIENTS: I certify that this patient is under my care and that I had a face-to-face encounter that meets the  physician face-to-face encounter requirements with this patient on this date. The encounter with the patient was in whole or in part for the following MEDICAL CONDITION: (primary reason for [place]) MEDICAL NECESSITY: I certify, that based on my findings, NURSING services are a medically necessary home health service. HOME BOUND STATUS: I certify that my clinical findings support that this patient is homebound (i.e., Due to illness or injury, pt requires aid of supportive devices such as crutches, cane, wheelchairs, walkers, the use of special transportation or the assistance of another person to leave their place of residence. There is a normal inability to leave the home and doing so requires considerable and taxing effort. Other absences are for medical reasons / religious services and are infrequent or of short duration when for other reasons). If current dressing causes regression in wound condition, may D/C ordered dressing product/s and apply Normal Saline Moist Dressing daily until next [place] / Other MD appointment. [place] of regression in wound condition at [phone]. Please direct any NON-WOUND related issues/requests for orders to patient's Primary Care Physician Wound #3 Right Elbow: [place] Nurse may visit PRN to address patient s wound care needs. FACE TO FACE ENCOUNTER: MEDICARE and MEDICAID PATIENTS: I certify that this patient is under my care and that I had a face-to-face encounter that meets the physician face-to-face encounter requirements with this patient on this date. The encounter with the patient was in whole or in part for the following MEDICAL CONDITION: (primary reason for [place]) MEDICAL NECESSITY: I certify, that based on my findings, NURSING services are a medically necessary home health service. HOME BOUND STATUS: I certify that my clinical findings support that this patient is  homebound (i.e., Due to illness or injury, pt requires aid of supportive devices such as crutches, cane, wheelchairs, walkers, the use Pinon, Ariele ([id number]) of special transportation or the assistance of another person to leave their place of  residence. There is a normal inability to leave the home and doing so requires considerable and taxing effort. Other absences are for medical reasons / religious services and are infrequent or of short duration when for other reasons). If current dressing causes regression in wound condition, may D/C ordered dressing product/s and apply Normal Saline Moist Dressing daily until next [place] / Other MD appointment. [place] of regression in wound condition at [phone]. Please direct any NON-WOUND related issues/requests for orders to patient's Primary Care Physician Wound #4 Right,Anterior Lower Leg: [place] Nurse may visit PRN to address patient s wound care needs. FACE TO FACE ENCOUNTER: MEDICARE and MEDICAID PATIENTS: I certify that this patient is under my care and that I had a face-to-face encounter that meets the physician face-to-face encounter requirements with this patient on this date. The encounter with the patient was in whole or in part for the following MEDICAL CONDITION: (primary reason for [place]) MEDICAL NECESSITY: I certify, that based on my findings, NURSING services are a medically necessary home health service. HOME BOUND STATUS: I certify that my clinical findings support that this patient is homebound (i.e., Due to illness or injury, pt requires aid of supportive devices such as crutches, cane, wheelchairs, walkers, the use of special transportation or the assistance of another person to leave their place of residence. There is a normal inability to leave the home and doing so requires considerable and taxing effort. Other absences are for medical  reasons / religious services and are infrequent or of short duration when for other reasons). If current dressing causes regression in wound condition, may D/C ordered dressing product/s and apply Normal Saline Moist Dressing daily until next [place] / Other MD appointment. [place] of regression in wound condition at [phone]. Please direct any NON-WOUND related issues/requests for orders to patient's Primary Care Physician Medications-please add to medication list.: Wound #4 Right,Anterior Lower Leg: Santyl Enzymatic Ointment Follow-Up Appointments: A follow-up appointment should be scheduled. A Patient Clinical Summary of Care was provided to LP 1. continue with Santyl to right lower extremity anterior wound 2. Aquacel Ag to left dorsal foot and right lower extreme a proximal wound 3. Cover right elbow with bordered foam dressing 4. Increase protein intake and continue vitamin supplementation 5. Return next week for follow-up KYMESHA, ACHORN ([id number]) Electronic Signature(s) Signed: 05/18/2016 4:39:17 PM By: Rene Kocher, NP, Cydne Grahn Entered By: Rene Kocher, NP, Kemaria Dedic on 05/18/2016 16:39:16 Mcguffee, [name] ([id number]) -------------------------------------------------------------------------------- SuperBill Details Patient Name: [name] Date of Service: 05/18/2016 Medical Record Number: [id number] Patient Account Number: [id number] Date of Birth/Sex: 1928/10/15 (82 y.o. Female) Treating RN: Montey Hora Primary Care Physician: Clayborn Bigness Other Clinician: Referring Physician: Clayborn Bigness Treating Physician/Extender: Lawanda Cousins Weeks in Treatment: 2 Diagnosis Coding ICD-10 Codes Code Description S51.011A Laceration without foreign body of right elbow, initial encounter S81.812A Laceration without foreign body, left lower leg, initial encounter S81.811A Laceration without foreign body, right lower leg, initial encounter L97.312  Non-pressure chronic ulcer of right ankle with fat layer exposed L97.522 Non-pressure chronic ulcer of other part of left foot with fat layer exposed C44.722 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, including hip C44.729 Squamous cell carcinoma of skin of left lower limb, including hip Facility Procedures CPT4 Code Description: [id number] - DEB SUBQ TISSUE 20 SQ CM/< ICD-10 Description Diagnosis S81.811A Laceration without foreign body, right lower leg, init Modifier: ial encounter Quantity: 1 CPT4 Code Description:  [id number] [id number] - DEB SUBQ TISS EA ADDL 20CM ICD-10 Description Diagnosis S81.811A Laceration without foreign body, right lower leg, init Modifier: ial encounter Quantity: 1 CPT4 Code Description: [id number] - DEBRIDE WOUND 1ST 20 SQ CM OR < ICD-10 Description Diagnosis S81.812A Laceration without foreign body, left lower leg, initi Modifier: al encounter Quantity: 1 Physician Procedures CPT4 Code Description: [id number] [id number] - WC PHYS SUBQ TISS 20 SQ CM ICD-10 Description Diagnosis S81.811A Laceration without foreign body, right lower leg, init Schoffstall, Kalasia ([id number]) Modifier: ial encounter Quantity: 1 Electronic Signature(s) Signed: 05/18/2016 4:39:44 PM By: Rene Kocher, NP, Jana Hakim By: Rene Kocher, NP, Hattie Pine on 05/18/2016 16:39:44

## 2016-05-25 ENCOUNTER — Encounter: Payer: Medicare Other | Admitting: Surgery

## 2016-05-25 DIAGNOSIS — C44722 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, including hip: Secondary | ICD-10-CM | POA: Diagnosis not present

## 2016-05-26 NOTE — Progress Notes (Signed)
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 ankle with fat layer exposed L97.522 - Non-pressure chronic ulcer of other part of left foot with fat layer exposed C44.722 - 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, including hip C44.729 - Squamous cell carcinoma of skin of left lower limb, including hip Procedures Wound #2 Wound #2 is a Trauma, Other located on the Left,Dorsal Foot . There was a Skin/Subcutaneous Tissue Debridement [id number]) debridement with total area of 0.18 sq cm performed by Christin Fudge, MD. with the following instrument(s): Forceps to remove Viable and Non-Viable tissue/material including Fibrin/Slough, Eschar, and Subcutaneous after achieving pain control using Lidocaine 4% Topical Solution. A time out was conducted at 15:03, prior to the start of the procedure. There was no bleeding. The procedure was tolerated well with a pain level of 10 throughout and a pain level of 0 following the procedure. Post  Debridement Measurements: 0.9cm length x  0.2cm width x 0.1cm depth; 0.014cm^3 volume. Character of Wound/Ulcer Post Debridement is improved. Severity of Tissue Post Debridement is: Fat layer exposed. Post procedure Diagnosis Wound #2: Same as Pre-Procedure Plan Wound Cleansing: Wound #2 Left,Dorsal Foot: Clean wound with Normal Saline. May Shower, gently pat wound dry prior to applying new dressing. [place], Christyna ([id number]) Wound #4 Right,Anterior Lower Leg: Clean wound with Normal Saline. May Shower, gently pat wound dry prior to applying new dressing. Anesthetic: Wound #2 Left,Dorsal Foot: Topical Lidocaine 4% cream applied to wound bed prior to debridement Wound #4 Right,Anterior Lower Leg: Topical Lidocaine 4% cream applied to wound bed prior to debridement Primary Wound Dressing: Wound #2 Left,Dorsal Foot: Aquacel Ag - or equivalent Wound #4 Right,Anterior Lower Leg: Santyl Ointment Plain packing gauze Secondary Dressing: Wound #2 Left,Dorsal Foot: ABD and Kerlix/Conform Wound #4 Right,Anterior Lower Leg: ABD and Kerlix/Conform Dressing Change Frequency: Wound #2 Left,Dorsal Foot: Change dressing every day. Wound #4 Right,Anterior Lower Leg: Change dressing every day. Follow-up Appointments: Wound #2 Left,Dorsal Foot: Return Appointment in 1 week. Wound #4 Right,Anterior Lower Leg: Return Appointment in 1 week. Edema Control: Wound #2 Left,Dorsal Foot: Elevate legs to the level of the heart and pump ankles as often as possible Wound #4 Right,Anterior Lower Leg: Elevate legs to the level of the heart and pump ankles as often as possible Additional Orders / Instructions: Wound #2 Left,Dorsal Foot: Increase protein intake. Wound #4 Right,Anterior Lower Leg: Increase protein intake. Home Health: Wound #2 Left,Dorsal Foot: [place] Nurse may visit PRN to address patient s wound care needs. FACE TO FACE ENCOUNTER: MEDICARE and  MEDICAID PATIENTS: I certify that this patient is under my care and that I had a face-to-face encounter that meets the physician face-to-face encounter requirements with this patient on this date. The encounter with the patient was in whole or in part for the following MEDICAL CONDITION: (primary reason for [place]) MEDICAL NECESSITY: I certify, that based on my findings, NURSING services are a medically necessary home health service. HOME BOUND STATUS: I certify that my clinical findings support that this patient is homebound (i.e., Due to illness or injury, pt requires aid of supportive devices such as crutches, cane, wheelchairs, walkers, the use of special transportation or the assistance of another person to leave their place of residence. There is a [place], [name] ([id number]) normal inability to leave the home and doing so requires considerable and taxing effort. Other absences are for medical reasons / religious services and are infrequent or of short duration when for other reasons). If current dressing causes regression in wound condition, may D/C ordered dressing product/s and apply Normal Saline Moist Dressing daily until next [place] / Other MD appointment. [place] of regression in wound condition at [phone]. Please direct any NON-WOUND related issues/requests for orders to patient's Primary Care Physician Wound #4 Right,Anterior Lower Leg: [place] Nurse may visit PRN to address patient s wound care needs. FACE TO FACE ENCOUNTER: MEDICARE and MEDICAID PATIENTS: I certify that this patient is under my care and that I had a face-to-face encounter that meets the physician face-to-face encounter requirements with this patient on this date. The encounter with the patient was in whole or in part for the following MEDICAL CONDITION: (primary reason for [place]) MEDICAL NECESSITY: I certify, that based  on my findings, NURSING services are a medically necessary home health service. HOME BOUND STATUS: I certify that my clinical  findings support that this patient is homebound (i.e., Due to illness or injury, pt requires aid of supportive devices such as crutches, cane, wheelchairs, walkers, the use of special transportation or the assistance of another person to leave their place of residence. There is a normal inability to leave the home and doing so requires considerable and taxing effort. Other absences are for medical reasons / religious services and are infrequent or of short duration when for other reasons). If current dressing causes regression in wound condition, may D/C ordered dressing product/s and apply Normal Saline Moist Dressing daily until next [place] / Other MD appointment. [place] of regression in wound condition at [phone]. Please direct any NON-WOUND related issues/requests for orders to patient's Primary Care Physician Medications-please add to medication list.: Wound #4 Right,Anterior Lower Leg: Santyl Enzymatic Ointment After review I have recommended: 1. Daily washing of her wounds with soap and water and application of Santyl ointment to the areas depicted clearly to her and her family members on the right lower extremity 2. Silver alginate to her left dorsum foot with a light dressing over this 3. Silver alginate to the right elbow and a light dressing over this 4. I have discussed the importance of regular dressings and good wound care. If there are any signs of cellulitis or sepsis I was asked to get in touch with [place] immediately or contact her PCP. 5. Discussed regular wound care visits and she and her daughter were all questions answered Electronic Signature(s) Signed: 05/25/2016 3:48:24 PM By: Christin Fudge MD, FACS Entered By: Christin Fudge on 05/25/2016 15:48:23 DYNASTEE, HEATH  ([id number]) -------------------------------------------------------------------------------- SuperBill Details Patient Name: [name] Date of Service: 05/25/2016 Medical Record Number: [id number] Patient Account Number: [id number] Date of Birth/Sex: 1928/10/30 (82 y.o. 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 C44.722 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, including hip C44.729 Squamous cell carcinoma of skin of left lower limb, including hip Facility Procedures CPT4 Code Description: [id number] - DEB SUBQ TISSUE 20 SQ CM/< ICD-10 Description Diagnosis S51.011A Laceration without foreign body of right elbow, initi L97.312 Non-pressure chronic ulcer of right ankle with fat la Modifier: al encounte yer exposed Quantity: 1 r Physician Procedures CPT4 Code Description: [id number] 11042 - WC PHYS SUBQ TISS 20 SQ CM ICD-10 Description Diagnosis S51.011A Laceration without foreign body of right elbow, initi L97.312 Non-pressure chronic ulcer of right ankle with fat la Modifier: al encounte yer exposed Quantity: 1 r Electronic Signature(s) Signed: 05/25/2016 3:48:40 PM By: Christin Fudge MD, FACS Entered By: Christin Fudge on 05/25/2016 15:48:40

## 2016-05-26 NOTE — Progress Notes (Signed)
AYLIAH, FLICK ([id number]) Visit Report for 05/25/2016 Arrival Information Details Patient Name: LISMARIE, FIELDING Date of Service: 05/25/2016 2:15 PM Medical Record Number: [id number] Patient Account Number: [id number] Date of Birth/Sex: [birth year] (82 y.o. Female) Treating RN: Montey Hora Primary Care Physician: Clayborn Bigness Other Clinician: Referring Physician: Clayborn Bigness Treating Physician/Extender: Frann Rider in Treatment: 3 Visit Information History Since Last Visit Added or deleted any medications: No Patient Arrived: Ambulatory Any new allergies or adverse reactions: No Arrival Time: 14:35 Had a fall or experienced change in No Accompanied By: dtr activities of daily living that may affect Transfer Assistance: None risk of falls: Patient Identification Verified: Yes Signs or symptoms of abuse/neglect since last No Secondary Verification Process Yes visito Completed: Hospitalized since last visit: No Patient Requires Transmission-Based No Pain Present Now: Yes Precautions: Patient Has Alerts: No Electronic Signature(s) Signed: 05/25/2016 5:15:35 PM By: Montey Hora Entered By: Montey Hora on 05/25/2016 14:35:41 Missildine, [name] ([id number]) -------------------------------------------------------------------------------- Encounter Discharge Information Details Patient Name: [name] Date of Service: 05/25/2016 2:15 PM Medical Record Number: [id number] Patient Account Number: [id number] Date of Birth/Sex: [birth year] (82 y.o. Female) Treating RN: Montey Hora Primary Care Physician: Clayborn Bigness Other Clinician: Referring Physician: Clayborn Bigness Treating Physician/Extender: Frann Rider in Treatment: 3 Encounter Discharge Information Items Discharge Pain Level: 0 Discharge Condition: Stable Ambulatory Status: Ambulatory Discharge Destination: Home Transportation: Private Auto Accompanied By: dtr Schedule Follow-up  Appointment: Yes Medication Reconciliation completed and provided to Patient/Care No Damond Borchers: Provided on Clinical Summary of Care: 05/25/2016 Form Type Recipient Paper Patient LP Electronic Signature(s) Signed: 05/25/2016 3:29:57 PM By: Ruthine Dose Entered By: Ruthine Dose on 05/25/2016 15:29:56 Goettel, Takiah ([id number]) -------------------------------------------------------------------------------- Lower Extremity Assessment Details Patient Name: [name] Date of Service: 05/25/2016 2:15 PM Medical Record Number: [id number] Patient Account Number: [id number] Date of Birth/Sex: 1928-07-11 (82 y.o. Female) Treating RN: Montey Hora Primary Care Physician: Clayborn Bigness Other Clinician: Referring Physician: Clayborn Bigness Treating Physician/Extender: Frann Rider in Treatment: 3 Edema Assessment Assessed: [Left: No] [Right: No] Edema: [Left: Ye] [Right: s] Calf Left: Right: Point of Measurement: 30 cm From Medial Instep cm 32.1 cm Ankle Left: Right: Point of Measurement: 10 cm From Medial Instep cm 20.7 cm Vascular Assessment Pulses: Dorsalis Pedis Palpable: [Right:Yes] Posterior Tibial Extremity colors, hair growth, and conditions: Extremity Color: [Right:Mottled] Hair Growth on Extremity: [Right:No] Temperature of Extremity: [Right:Warm] Capillary Refill: [Right:< 3 seconds] Electronic Signature(s) Signed: 05/25/2016 5:15:35 PM By: Montey Hora Entered By: Montey Hora on 05/25/2016 14:46:50 [place], [name] ([id number]) -------------------------------------------------------------------------------- Multi Wound Chart Details Patient Name: [name] Date of Service: 05/25/2016 2:15 PM Medical Record Number: [id number] Patient Account Number: [id number] Date of Birth/Sex: 05/12/1929 (82 y.o. Female) Treating RN: Montey Hora Primary Care Physician: Clayborn Bigness Other Clinician: Referring Physician: Clayborn Bigness Treating  Physician/Extender: Frann Rider in Treatment: 3 Vital Signs Height(in): 60 Pulse(bpm): 74 Weight(lbs): 122 Blood Pressure 147/95 (mmHg): Body Mass Index(BMI): 24 Temperature(F): 98.1 Respiratory Rate 18 (breaths/min): Photos: Wound Location: Right Lower Leg - Left Foot - Dorsal Right Elbow Proximal Wounding Event: Trauma Trauma Trauma Primary Etiology: Trauma, Other Trauma, Other Trauma, Other Comorbid History: Asthma, Hypertension Asthma, Hypertension Asthma, Hypertension Date Acquired: 04/21/2016 04/21/2016 04/21/2016 Weeks of Treatment: 3 3 3  Wound Status: Open Open Open Clustered Wound: Yes No No Pending Amputation on Yes No No Presentation: Measurements L x W x D 0.1x0.1x0.1 0.1x0.1x0.1 0.1x0.1x0.1 (cm) Area (cm) : 0.008 0.008 0.008 Volume (cm) : 0.001 0.001 0.001 % Reduction in Area: 100.00% 99.30% 99.50% % Reduction in Volume:  100.00% 99.20% 99.30% Tunneling: No No No Classification: Full Thickness Without Partial Thickness Partial Thickness Exposed Support Structures Exudate Amount: None Present None Present None Present Exudate Type: N/A N/A N/A Difabio, Latona ([id number]) Exudate Color: N/A N/A N/A Wound Margin: Flat and Intact Flat and Intact Flat and Intact Granulation Amount: None Present (0%) None Present (0%) None Present (0%) Granulation Quality: N/A N/A N/A Necrotic Amount: Large (67-100%) Large (67-100%) Large (67-100%) Necrotic Tissue: Eschar Eschar, Adherent Slough Eschar, Adherent Slough Exposed Structures: Fascia: No Fascia: No Fascia: No Fat: No Fat: No Fat: No Tendon: No Tendon: No Tendon: No Muscle: No Muscle: No Muscle: No Joint: No Joint: No Joint: No Bone: No Bone: No Bone: No Limited to Skin Limited to Skin Limited to Skin Breakdown Breakdown Breakdown Epithelialization: Large (67-100%) Large (67-100%) None Periwound Skin Texture: Edema: Yes Edema: No Edema: Yes Excoriation: No Excoriation:  No Excoriation: No Induration: No Induration: No Induration: No Callus: No Callus: No Callus: No Crepitus: No Crepitus: No Crepitus: No Fluctuance: No Fluctuance: No Fluctuance: No Friable: No Friable: No Friable: No Rash: No Rash: No Rash: No Scarring: No Scarring: No Scarring: No Periwound Skin Maceration: Yes Moist: Yes Moist: Yes Moisture: Moist: Yes Maceration: No Maceration: No Dry/Scaly: No Dry/Scaly: No Dry/Scaly: No Periwound Skin Color: Erythema: Yes Erythema: Yes Erythema: Yes Atrophie Blanche: No Atrophie Blanche: No Atrophie Blanche: No Cyanosis: No Cyanosis: No Cyanosis: No Ecchymosis: No Ecchymosis: No Ecchymosis: No Hemosiderin Staining: No Hemosiderin Staining: No Hemosiderin Staining: No Mottled: No Mottled: No Mottled: No Pallor: No Pallor: No Pallor: No Rubor: No Rubor: No Rubor: No Erythema Location: Circumferential Circumferential Circumferential Temperature: No Abnormality No Abnormality No Abnormality Tenderness on Yes Yes Yes Palpation: Wound Preparation: Ulcer Cleansing: Ulcer Cleansing: Ulcer Cleansing: Rinsed/Irrigated with Rinsed/Irrigated with Rinsed/Irrigated with Saline Saline Saline Topical Anesthetic Topical Anesthetic Topical Anesthetic Applied: Other: lidocaine Applied: Other: lidocaine Applied: Other: lidocaine 4% 4% 4% Wound Number: 4 N/A N/A Photos: N/A N/A NEFERTITI, LIPPARD ([id number]) Wound Location: Right Lower Leg - Anterior N/A N/A Wounding Event: Trauma N/A N/A Primary Etiology: Trauma, Other N/A N/A Comorbid History: Asthma, Hypertension N/A N/A Date Acquired: 04/21/2016 N/A N/A Weeks of Treatment: 1 N/A N/A Wound Status: Open N/A N/A Clustered Wound: No N/A N/A Pending Amputation on No N/A N/A Presentation: Measurements L x W x D 11x1.5x1.4 N/A N/A (cm) Area (cm) : 12.959 N/A N/A Volume (cm) : 18.143 N/A N/A % Reduction in Area: 55.20% N/A N/A % Reduction in Volume: 63.10% N/A  N/A Position 1 (o'clock): 12 Maximum Distance 1 0.7 (cm): Tunneling: Yes N/A N/A Classification: Full Thickness Without N/A N/A Exposed Support Structures Exudate Amount: Large N/A N/A Exudate Type: Serous N/A N/A Exudate Color: amber N/A N/A Wound Margin: Flat and Intact N/A N/A Granulation Amount: Small (1-33%) N/A N/A Granulation Quality: Red N/A N/A Necrotic Amount: Large (67-100%) N/A N/A Necrotic Tissue: Eschar, Adherent Slough N/A N/A Exposed Structures: Fascia: No N/A N/A Fat: No Tendon: No Muscle: No Joint: No Bone: No Limited to Skin Breakdown Epithelialization: None N/A N/A Periwound Skin Texture: Edema: No N/A N/A Excoriation: No Induration: No Callus: No Crepitus: No Hoey, Elaf ([id number]) Fluctuance: No Friable: No Rash: No Scarring: No Periwound Skin Moist: Yes N/A N/A Moisture: Maceration: No Dry/Scaly: No Periwound Skin Color: Erythema: Yes N/A N/A Atrophie Blanche: No Cyanosis: No Ecchymosis: No Hemosiderin Staining: No Mottled: No Pallor: No Rubor: No Erythema Location: Circumferential N/A N/A Temperature: N/A N/A N/A Tenderness on Yes N/A N/A Palpation: Wound Preparation: Ulcer  Cleansing: N/A N/A Rinsed/Irrigated with Saline Topical Anesthetic Applied: Other: lidocaine 4% Treatment Notes Electronic Signature(s) Signed: 05/25/2016 5:15:35 PM By: Montey Hora Entered By: Montey Hora on 05/25/2016 15:05:08 ZAYDI, GILLELAND ([id number]) -------------------------------------------------------------------------------- Multi-Disciplinary Care Plan Details Patient Name: [name] Date of Service: 05/25/2016 2:15 PM Medical Record Number: [id number] Patient Account Number: [id number] Date of Birth/Sex: [birth year] (82 y.o. Female) Treating RN: Montey Hora Primary Care Physician: Clayborn Bigness Other Clinician: Referring Physician: Clayborn Bigness Treating Physician/Extender: Frann Rider in Treatment:  3 Active Inactive Abuse / Safety / Falls / Self Care Management Nursing Diagnoses: Impaired physical mobility Potential for falls Goals: Patient will remain injury free Date Initiated: 05/04/2016 Goal Status: Active Interventions: Assess fall risk on admission and as needed Notes: Orientation to the Wound Care Program Nursing Diagnoses: Knowledge deficit related to the wound healing center program Goals: Patient/caregiver will verbalize understanding of the [place] Program Date Initiated: 05/04/2016 Goal Status: Active Interventions: Provide education on orientation to the wound center Notes: Pain, Acute or Chronic Nursing Diagnoses: Pain, acute or chronic: actual or potential Goals: Patient/caregiver will verbalize adequate pain control between visits NIVAYA, POTEETE ([id number]) Date Initiated: 05/04/2016 Goal Status: Active Interventions: Complete pain assessment as per visit requirements Notes: Wound/Skin Impairment Nursing Diagnoses: Impaired tissue integrity Goals: Patient/caregiver will verbalize understanding of skin care regimen Date Initiated: 05/04/2016 Goal Status: Active Ulcer/skin breakdown will have a volume reduction of 30% by week 4 Date Initiated: 05/04/2016 Goal Status: Active Ulcer/skin breakdown will have a volume reduction of 50% by week 8 Date Initiated: 05/04/2016 Goal Status: Active Ulcer/skin breakdown will have a volume reduction of 80% by week 12 Date Initiated: 05/04/2016 Goal Status: Active Ulcer/skin breakdown will heal within 14 weeks Date Initiated: 05/04/2016 Goal Status: Active Interventions: Assess patient/caregiver ability to obtain necessary supplies Assess patient/caregiver ability to perform ulcer/skin care regimen upon admission and as needed Assess ulceration(s) every visit Notes: Electronic Signature(s) Signed: 05/25/2016 5:15:35 PM By: Montey Hora Entered By: Montey Hora on 05/25/2016  15:04:44 Brotzman, [name] ([id number]) -------------------------------------------------------------------------------- Pain Assessment Details Patient Name: [name] Date of Service: 05/25/2016 2:15 PM Medical Record Number: [id number] Patient Account Number: [id number] Date of Birth/Sex: 1929-01-05 (82 y.o. Female) Treating RN: Montey Hora Primary Care Physician: Clayborn Bigness Other Clinician: Referring Physician: Clayborn Bigness Treating Physician/Extender: Frann Rider in Treatment: 3 Active Problems Location of Pain Severity and Description of Pain Patient Has Paino Yes Site Locations Pain Location: Pain in Ulcers With Dressing Change: Yes Duration of the Pain. Constant / Intermittento Constant Pain Management and Medication Current Pain Management: Notes Topical or injectable lidocaine is offered to patient for acute pain when surgical debridement is performed. If needed, Patient is instructed to use over the counter pain medication for the following 24-48 hours after debridement. Wound care MDs do not prescribed pain medications. Patient has chronic pain or uncontrolled pain. Patient has been instructed to make an appointment with their Primary Care Physician for pain management. Electronic Signature(s) Signed: 05/25/2016 5:15:35 PM By: Montey Hora Entered By: Montey Hora on 05/25/2016 14:35:56 [name] ([id number]) -------------------------------------------------------------------------------- Patient/Caregiver Education Details Patient Name: [name] Date of Service: 05/25/2016 2:15 PM Medical Record Number: [id number] Patient Account Number: [id number] Date of Birth/Gender: 06/22/1928 (82 y.o. Female) Treating RN: Montey Hora Primary Care Physician: Clayborn Bigness Other Clinician: Referring Physician: Clayborn Bigness Treating Physician/Extender: Frann Rider in Treatment: 3 Education Assessment Education Provided  To: Patient and Caregiver Education Topics Provided Wound/Skin Impairment: Handouts: Other: wound care as ordered Methods: Demonstration, Explain/Verbal  Responses: State content correctly Electronic Signature(s) Signed: 05/25/2016 5:15:35 PM By: Montey Hora Entered By: Montey Hora on 05/25/2016 15:11:44 Thul, [name] ([id number]) -------------------------------------------------------------------------------- Wound Assessment Details Patient Name: [name] Date of Service: 05/25/2016 2:15 PM Medical Record Number: [id number] Patient Account Number: [id number] Date of Birth/Sex: January 13, 1929 (82 y.o. Female) Treating RN: Montey Hora Primary Care Physician: Clayborn Bigness Other Clinician: Referring Physician: Clayborn Bigness Treating Physician/Extender: Frann Rider in Treatment: 3 Wound Status Wound Number: 1 Primary Etiology: Trauma, Other Wound Location: Right, Proximal Lower Leg Wound Status: Healed - Epithelialized Wounding Event: Trauma Comorbid History: Asthma, Hypertension Date Acquired: 04/21/2016 Weeks Of Treatment: 3 Clustered Wound: Yes Pending Amputation On Presentation Photos Wound Measurements Length: (cm) 0 % Reduction Width: (cm) 0 % Reduction Depth: (cm) 0 Epithelializ Area: (cm) 0 Tunneling: Volume: (cm) 0 Undermining in Area: 100% in Volume: 100% ation: Large (67-100%) No : No Wound Description Full Thickness Without Exposed Foul Odor Af Classification: Support Structures Wound Margin: Flat and Intact Exudate None Present Amount: ter Cleansing: No Wound Bed Granulation Amount: None Present (0%) Exposed Structure Necrotic Amount: Large (67-100%) Fascia Exposed: No Necrotic Quality: Eschar Fat Layer Exposed: No Tendon Exposed: No Miers, Jonquil ([id number]) Muscle Exposed: No Joint Exposed: No Bone Exposed: No Limited to Skin Breakdown Periwound Skin Texture Texture Color No Abnormalities Noted: No No  Abnormalities Noted: No Callus: No Atrophie Blanche: No Crepitus: No Cyanosis: No Excoriation: No Ecchymosis: No Fluctuance: No Erythema: Yes Friable: No Erythema Location: Circumferential Induration: No Hemosiderin Staining: No Localized Edema: Yes Mottled: No Rash: No Pallor: No Scarring: No Rubor: No Moisture Temperature / Pain No Abnormalities Noted: No Temperature: No Abnormality Dry / Scaly: No Tenderness on Palpation: Yes Maceration: Yes Moist: Yes Wound Preparation Ulcer Cleansing: Rinsed/Irrigated with Saline Topical Anesthetic Applied: Other: lidocaine 4%, Electronic Signature(s) Signed: 05/25/2016 5:15:35 PM By: Montey Hora Entered By: Montey Hora on 05/25/2016 15:10:11 [name] ([id number]) -------------------------------------------------------------------------------- Wound Assessment Details Patient Name: [name] Date of Service: 05/25/2016 2:15 PM Medical Record Number: [id number] Patient Account Number: [id number] Date of Birth/Sex: Jan 16, 1929 (82 y.o. Female) Treating RN: Montey Hora Primary Care Physician: Clayborn Bigness Other Clinician: Referring Physician: Clayborn Bigness Treating Physician/Extender: Frann Rider in Treatment: 3 Wound Status Wound Number: 2 Primary Etiology: Trauma, Other Wound Location: Left Foot - Dorsal Wound Status: Open Wounding Event: Trauma Comorbid History: Asthma, Hypertension Date Acquired: 04/21/2016 Weeks Of Treatment: 3 Clustered Wound: No Photos Wound Measurements Length: (cm) 0.1 Width: (cm) 0.1 Depth: (cm) 0.1 Area: (cm) 0.008 Volume: (cm) 0.001 % Reduction in Area: 99.3% % Reduction in Volume: 99.2% Epithelialization: Large (67-100%) Tunneling: No Undermining: No Wound Description Classification: Partial Thickness Wound Margin: Flat and Intact Exudate Amount: None Present Foul Odor After Cleansing: No Wound Bed Granulation Amount: None Present (0%) Exposed  Structure Necrotic Amount: Large (67-100%) Fascia Exposed: No Necrotic Quality: Eschar, Adherent Slough Fat Layer Exposed: No Tendon Exposed: No Muscle Exposed: No Joint Exposed: No Bone Exposed: No Gladman, Haley ([id number]) Limited to Skin Breakdown Periwound Skin Texture Texture Color No Abnormalities Noted: No No Abnormalities Noted: No Callus: No Atrophie Blanche: No Crepitus: No Cyanosis: No Excoriation: No Ecchymosis: No Fluctuance: No Erythema: Yes Friable: No Erythema Location: Circumferential Induration: No Hemosiderin Staining: No Localized Edema: No Mottled: No Rash: No Pallor: No Scarring: No Rubor: No Moisture Temperature / Pain No Abnormalities Noted: No Temperature: No Abnormality Dry / Scaly: No Tenderness on Palpation: Yes Maceration: No Moist: Yes Wound Preparation Ulcer Cleansing: Rinsed/Irrigated with Saline Topical Anesthetic Applied:  Other: lidocaine 4%, Treatment Notes Wound #2 (Left, Dorsal Foot) 1. Cleansed with: Clean wound with Normal Saline 2. Anesthetic Topical Lidocaine 4% cream to wound bed prior to debridement 4. Dressing Applied: Aquacel Ag 5. Secondary Dressing Applied Kerlix/Conform Non-Adherent pad 7. Secured with Recruitment consultant) Signed: 05/25/2016 5:15:35 PM By: Montey Hora Entered By: Montey Hora on 05/25/2016 14:55:42 Pina, Kalynne ([id number]) -------------------------------------------------------------------------------- Wound Assessment Details Patient Name: [name] Date of Service: 05/25/2016 2:15 PM Medical Record Number: [id number] Patient Account Number: [id number] Date of Birth/Sex: [birth year] (82 y.o. Female) Treating RN: Montey Hora Primary Care Physician: Clayborn Bigness Other Clinician: Referring Physician: Clayborn Bigness Treating Physician/Extender: Frann Rider in Treatment: 3 Wound Status Wound Number: 3 Primary Etiology: Trauma, Other Wound  Location: Right Elbow Wound Status: Healed - Epithelialized Wounding Event: Trauma Comorbid History: Asthma, Hypertension Date Acquired: 04/21/2016 Weeks Of Treatment: 3 Clustered Wound: No Photos Wound Measurements Length: (cm) 0 Width: (cm) 0 Depth: (cm) 0 Area: (cm) 0 Volume: (cm) 0 % Reduction in Area: 100% % Reduction in Volume: 100% Epithelialization: None Tunneling: No Undermining: No Wound Description Classification: Partial Thickness Foul Odor Af Wound Margin: Flat and Intact Exudate Amount: None Present ter Cleansing: No Wound Bed Granulation Amount: None Present (0%) Exposed Structure Necrotic Amount: Large (67-100%) Fascia Exposed: No Necrotic Quality: Eschar, Adherent Slough Fat Layer Exposed: No Tendon Exposed: No Muscle Exposed: No Joint Exposed: No Bone Exposed: No Hust, Tarina ([id number]) Limited to Skin Breakdown Periwound Skin Texture Texture Color No Abnormalities Noted: No No Abnormalities Noted: No Callus: No Atrophie Blanche: No Crepitus: No Cyanosis: No Excoriation: No Ecchymosis: No Fluctuance: No Erythema: Yes Friable: No Erythema Location: Circumferential Induration: No Hemosiderin Staining: No Localized Edema: Yes Mottled: No Rash: No Pallor: No Scarring: No Rubor: No Moisture Temperature / Pain No Abnormalities Noted: No Temperature: No Abnormality Dry / Scaly: No Tenderness on Palpation: Yes Maceration: No Moist: Yes Wound Preparation Ulcer Cleansing: Rinsed/Irrigated with Saline Topical Anesthetic Applied: Other: lidocaine 4%, Electronic Signature(s) Signed: 05/25/2016 5:15:35 PM By: Montey Hora Entered By: Montey Hora on 05/25/2016 15:10:11 [name] ([id number]) -------------------------------------------------------------------------------- Wound Assessment Details Patient Name: [name] Date of Service: 05/25/2016 2:15 PM Medical Record Number: [id number] Patient Account  Number: [id number] Date of Birth/Sex: 1929/01/10 (82 y.o. Female) Treating RN: Montey Hora Primary Care Physician: Clayborn Bigness Other Clinician: Referring Physician: Clayborn Bigness Treating Physician/Extender: Frann Rider in Treatment: 3 Wound Status Wound Number: 4 Primary Etiology: Trauma, Other Wound Location: Right Lower Leg - Anterior Wound Status: Open Wounding Event: Trauma Comorbid History: Asthma, Hypertension Date Acquired: 04/21/2016 Weeks Of Treatment: 1 Clustered Wound: No Photos Wound Measurements Length: (cm) 11 Width: (cm) 1.5 Depth: (cm) 1.4 Area: (cm) 12.959 Volume: (cm) 18.143 % Reduction in Area: 55.2% % Reduction in Volume: 63.1% Epithelialization: None Tunneling: Yes Position (o'clock): 12 Maximum Distance: (cm) 0.7 Undermining: No Wound Description Full Thickness Without Exposed Classification: Support Structures Wound Margin: Flat and Intact Exudate Large Amount: Exudate Type: Serous Exudate Color: amber Foul Odor After Cleansing: No Wound Bed Charity, Asyria ([id number]) Granulation Amount: Small (1-33%) Exposed Structure Granulation Quality: Red Fascia Exposed: No Necrotic Amount: Large (67-100%) Fat Layer Exposed: No Necrotic Quality: Eschar, Adherent Slough Tendon Exposed: No Muscle Exposed: No Joint Exposed: No Bone Exposed: No Limited to Skin Breakdown Periwound Skin Texture Texture Color No Abnormalities Noted: No No Abnormalities Noted: No Callus: No Atrophie Blanche: No Crepitus: No Cyanosis: No Excoriation: No Ecchymosis: No Fluctuance: No Erythema: Yes Friable: No Erythema Location: Circumferential Induration: No  Hemosiderin Staining: No Localized Edema: No Mottled: No Rash: No Pallor: No Scarring: No Rubor: No Moisture Temperature / Pain No Abnormalities Noted: No Tenderness on Palpation: Yes Dry / Scaly: No Maceration: No Moist: Yes Wound Preparation Ulcer Cleansing: Rinsed/Irrigated  with Saline Topical Anesthetic Applied: Other: lidocaine 4%, Treatment Notes Wound #4 (Right, Anterior Lower Leg) 1. Cleansed with: Clean wound with Normal Saline 2. Anesthetic Topical Lidocaine 4% cream to wound bed prior to debridement 4. Dressing Applied: Santyl Ointment Plain packing gauze 5. Secondary Dressing Applied ABD Pad Kerlix/Conform Non-Adherent pad 7. Secured with Tape GENAY, DANTONI ([id number]) Electronic Signature(s) Signed: 05/25/2016 5:15:35 PM By: Montey Hora Entered By: Montey Hora on 05/25/2016 14:54:37 Halteman, [name] ([id number]) -------------------------------------------------------------------------------- [place] Details Patient Name: [name] Date of Service: 05/25/2016 2:15 PM Medical Record Number: [id number] Patient Account Number: [id number] Date of Birth/Sex: 1928/12/20 (82 y.o. Female) Treating RN: Montey Hora Primary Care Physician: Clayborn Bigness Other Clinician: Referring Physician: Clayborn Bigness Treating Physician/Extender: Frann Rider in Treatment: 3 Vital Signs Time Taken: 14:35 Temperature (F): 98.1 Height (in): 60 Pulse (bpm): 74 Weight (lbs): 122 Respiratory Rate (breaths/min): 18 Body Mass Index (BMI): 23.8 Blood Pressure (mmHg): 147/95 Reference Range: 80 - 120 mg / dl Electronic Signature(s) Signed: 05/25/2016 5:15:35 PM By: Montey Hora Entered By: Montey Hora on 05/25/2016 14:38:32

## 2016-06-02 ENCOUNTER — Encounter: Payer: Medicare Other | Attending: Surgery | Admitting: Surgery

## 2016-06-02 DIAGNOSIS — F419 Anxiety disorder, unspecified: Secondary | ICD-10-CM | POA: Insufficient documentation

## 2016-06-02 DIAGNOSIS — L97312 Non-pressure chronic ulcer of right ankle with fat layer exposed: Secondary | ICD-10-CM | POA: Insufficient documentation

## 2016-06-02 DIAGNOSIS — J45909 Unspecified asthma, uncomplicated: Secondary | ICD-10-CM | POA: Insufficient documentation

## 2016-06-02 DIAGNOSIS — S81811A Laceration without foreign body, right lower leg, initial encounter: Secondary | ICD-10-CM | POA: Diagnosis not present

## 2016-06-02 DIAGNOSIS — L97522 Non-pressure chronic ulcer of other part of left foot with fat layer exposed: Secondary | ICD-10-CM | POA: Diagnosis not present

## 2016-06-02 DIAGNOSIS — I351 Nonrheumatic aortic (valve) insufficiency: Secondary | ICD-10-CM | POA: Insufficient documentation

## 2016-06-02 DIAGNOSIS — X58XXXA Exposure to other specified factors, initial encounter: Secondary | ICD-10-CM | POA: Insufficient documentation

## 2016-06-02 DIAGNOSIS — S51011A Laceration without foreign body of right elbow, initial encounter: Secondary | ICD-10-CM | POA: Diagnosis not present

## 2016-06-02 DIAGNOSIS — I10 Essential (primary) hypertension: Secondary | ICD-10-CM | POA: Diagnosis not present

## 2016-06-02 DIAGNOSIS — R011 Cardiac murmur, unspecified: Secondary | ICD-10-CM | POA: Diagnosis not present

## 2016-06-02 DIAGNOSIS — C44729 Squamous cell carcinoma of skin of left lower limb, including hip: Secondary | ICD-10-CM | POA: Diagnosis not present

## 2016-06-02 DIAGNOSIS — S81812A Laceration without foreign body, left lower leg, initial encounter: Secondary | ICD-10-CM | POA: Insufficient documentation

## 2016-06-02 DIAGNOSIS — C44722 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, including hip: Secondary | ICD-10-CM | POA: Insufficient documentation

## 2016-06-02 DIAGNOSIS — I739 Peripheral vascular disease, unspecified: Secondary | ICD-10-CM | POA: Diagnosis not present

## 2016-06-03 NOTE — Progress Notes (Signed)
DENETRICE, STARCK ([id number]) Visit Report for 06/02/2016 Arrival Information Details Patient Name: [name], [name] Date of Service: 06/02/2016 1:15 PM Medical Record Number: [id number] Patient Account Number: [id number] Date of Birth/Sex: 08-14-1928 (83 y.o. Female) Treating RN: Montey Hora Primary Care Physician: Clayborn Bigness Other Clinician: Referring Physician: Clayborn Bigness Treating Physician/Extender: Frann Rider in Treatment: 4 Visit Information History Since Last Visit Added or deleted any medications: No Patient Arrived: Ambulatory Any new allergies or adverse reactions: No Arrival Time: 13:15 Had a fall or experienced change in No Accompanied By: dtr activities of daily living that may affect Transfer Assistance: None risk of falls: Patient Identification Verified: Yes Signs or symptoms of abuse/neglect since last No Secondary Verification Process Yes visito Completed: Hospitalized since last visit: No Patient Requires Transmission-Based No Pain Present Now: Yes Precautions: Patient Has Alerts: No Electronic Signature(s) Signed: 06/02/2016 5:21:18 PM By: Montey Hora Entered By: Montey Hora on 06/02/2016 13:15:50 [name] ([id number]) -------------------------------------------------------------------------------- Encounter Discharge Information Details Patient Name: [name] Date of Service: 06/02/2016 1:15 PM Medical Record Number: [id number] Patient Account Number: [id number] Date of Birth/Sex: [birth year] (83 y.o. Female) Treating RN: Montey Hora Primary Care Physician: Clayborn Bigness Other Clinician: Referring Physician: Clayborn Bigness Treating Physician/Extender: Frann Rider in Treatment: 4 Encounter Discharge Information Items Discharge Pain Level: 0 Discharge Condition: Stable Ambulatory Status: Ambulatory Discharge Destination: Home Transportation: Private Auto Accompanied By: dtr Schedule Follow-up Appointment:  Yes Medication Reconciliation completed and provided to Patient/Care No Katelee Schupp: Provided on Clinical Summary of Care: 06/02/2016 Form Type Recipient Paper Patient LP Electronic Signature(s) Signed: 06/02/2016 2:04:00 PM By: Ruthine Dose Entered By: Ruthine Dose on 06/02/2016 14:04:00 [name] ([id number]) -------------------------------------------------------------------------------- Lower Extremity Assessment Details Patient Name: [name] Date of Service: 06/02/2016 1:15 PM Medical Record Number: [id number] Patient Account Number: [id number] Date of Birth/Sex: 27-Aug-1928 (83 y.o. Female) Treating RN: Montey Hora Primary Care Physician: Clayborn Bigness Other Clinician: Referring Physician: Clayborn Bigness Treating Physician/Extender: Frann Rider in Treatment: 4 Edema Assessment Assessed: [Left: No] [Right: No] Edema: [Left: Ye] [Right: s] Vascular Assessment Pulses: Dorsalis Pedis Palpable: [Right:Yes] Posterior Tibial Extremity colors, hair growth, and conditions: Extremity Color: [Right:Mottled] Hair Growth on Extremity: [Right:No] Temperature of Extremity: [Right:Warm] Capillary Refill: [Right:< 3 seconds] Electronic Signature(s) Signed: 06/02/2016 5:21:18 PM By: Montey Hora Entered By: Montey Hora on 06/02/2016 13:32:03 [name] ([id number]) -------------------------------------------------------------------------------- Multi Wound Chart Details Patient Name: [name] Date of Service: 06/02/2016 1:15 PM Medical Record Number: [id number] Patient Account Number: [id number] Date of Birth/Sex: [birth year] (83 y.o. Female) Treating RN: Montey Hora Primary Care Physician: Clayborn Bigness Other Clinician: Referring Physician: Clayborn Bigness Treating Physician/Extender: Frann Rider in Treatment: 4 Vital Signs Height(in): 60 Pulse(bpm): 70 Weight(lbs): 122 Blood Pressure 171/61 (mmHg): Body Mass Index(BMI):  24 Temperature(F): 98.0 Respiratory Rate 18 (breaths/min): Photos: [N/A:N/A] Wound Location: Left, Dorsal Foot Right Lower Leg - Anterior N/A Wounding Event: Trauma Trauma N/A Primary Etiology: Trauma, Other Trauma, Other N/A Comorbid History: Asthma, Hypertension Asthma, Hypertension N/A Date Acquired: 04/21/2016 04/21/2016 N/A Weeks of Treatment: 4 2 N/A Wound Status: Healed - Epithelialized Open N/A Measurements L x W x D 0x0x0 11x1.4x1.2 N/A (cm) Area (cm) : 0 12.095 N/A Volume (cm) : 0 14.514 N/A % Reduction in Area: 100.00% 58.20% N/A % Reduction in Volume: 100.00% 70.50% N/A Classification: Partial Thickness Full Thickness Without N/A Exposed Support Structures Exudate Amount: None Present Large N/A Exudate Type: N/A Serous N/A Exudate Color: N/A amber N/A Wound Margin: Flat and Intact Flat and Intact N/A Granulation Amount: Large (  67-100%) Small (1-33%) N/A Granulation Quality: Red Red N/A Necrotic Amount: None Present (0%) Large (67-100%) N/A [name] ([id number]) Necrotic Tissue: N/A Eschar, Adherent Slough N/A Exposed Structures: Fascia: No Fascia: No N/A Fat: No Fat: No Tendon: No Tendon: No Muscle: No Muscle: No Joint: No Joint: No Bone: No Bone: No Limited to Skin Limited to Skin Breakdown Breakdown Epithelialization: Large (67-100%) None N/A Debridement: N/A Debridement [id number]) Pre-procedure N/A 13:40 N/A Verification/Time Out Taken: Pain Control: N/A Lidocaine 4% Topical N/A Solution Tissue Debrided: N/A Fibrin/Slough, N/A Subcutaneous Level: N/A Skin/Subcutaneous N/A Tissue Debridement Area (sq N/A 15.4 N/A cm): Instrument: N/A Curette N/A Bleeding: N/A Minimum N/A Hemostasis Achieved: N/A Pressure N/A Procedural Pain: N/A 0 N/A Post Procedural Pain: N/A 0 N/A Debridement Treatment N/A Procedure was tolerated N/A Response: well Post Debridement N/A 11x1.4x1.3 N/A Measurements L x W x D (cm) Post  Debridement N/A 15.724 N/A Volume: (cm) Periwound Skin Texture: Edema: No Edema: No N/A Excoriation: No Excoriation: No Induration: No Induration: No Callus: No Callus: No Crepitus: No Crepitus: No Fluctuance: No Fluctuance: No Friable: No Friable: No Rash: No Rash: No Scarring: No Scarring: No Periwound Skin Moist: Yes Moist: Yes N/A Moisture: Maceration: No Maceration: No Dry/Scaly: No Dry/Scaly: No Periwound Skin Color: Erythema: Yes Erythema: Yes N/A Atrophie Blanche: No Atrophie Blanche: No Cyanosis: No Cyanosis: No Advincula, Desi ([id number]) Ecchymosis: No Ecchymosis: No Hemosiderin Staining: No Hemosiderin Staining: No Mottled: No Mottled: No Pallor: No Pallor: No Rubor: No Rubor: No Erythema Location: Circumferential Circumferential N/A Temperature: No Abnormality N/A N/A Tenderness on Yes Yes N/A Palpation: Wound Preparation: Ulcer Cleansing: Ulcer Cleansing: N/A Rinsed/Irrigated with Rinsed/Irrigated with Saline Saline Topical Anesthetic Topical Anesthetic Applied: Other: lidocaine Applied: Other: lidocaine 4% 4% Procedures Performed: N/A Debridement N/A Treatment Notes Electronic Signature(s) Signed: 06/02/2016 1:53:52 PM By: Christin Fudge MD, FACS Entered By: Christin Fudge on 06/02/2016 13:53:52 IRIDIANA, PILSON ([id number]) -------------------------------------------------------------------------------- [place] Details Patient Name: [name] Date of Service: 06/02/2016 1:15 PM Medical Record Number: [id number] Patient Account Number: [id number] Date of Birth/Sex: 07-Nov-1928 (83 y.o. Female) Treating RN: Montey Hora Primary Care Physician: Clayborn Bigness Other Clinician: Referring Physician: Clayborn Bigness Treating Physician/Extender: Frann Rider in Treatment: 4 Active Inactive Abuse / Safety / Falls / Self Care Management Nursing Diagnoses: Impaired physical mobility Potential for  falls Goals: Patient will remain injury free Date Initiated: 05/04/2016 Goal Status: Active Interventions: Assess fall risk on admission and as needed Notes: Orientation to the Wound Care Program Nursing Diagnoses: Knowledge deficit related to the wound healing center program Goals: Patient/caregiver will verbalize understanding of the [facility] Date Initiated: 05/04/2016 Goal Status: Active Interventions: Provide education on orientation to the wound center Notes: Pain, Acute or Chronic Nursing Diagnoses: Pain, acute or chronic: actual or potential Goals: Patient/caregiver will verbalize adequate pain control between visits KAMBREE, LUCKING ([id number]) Date Initiated: 05/04/2016 Goal Status: Active Interventions: Complete pain assessment as per visit requirements Notes: Wound/Skin Impairment Nursing Diagnoses: Impaired tissue integrity Goals: Patient/caregiver will verbalize understanding of skin care regimen Date Initiated: 05/04/2016 Goal Status: Active Ulcer/skin breakdown will have a volume reduction of 30% by week 4 Date Initiated: 05/04/2016 Goal Status: Active Ulcer/skin breakdown will have a volume reduction of 50% by week 8 Date Initiated: 05/04/2016 Goal Status: Active Ulcer/skin breakdown will have a volume reduction of 80% by week 12 Date Initiated: 05/04/2016 Goal Status: Active Ulcer/skin breakdown will heal within 14 weeks Date Initiated: 05/04/2016 Goal Status: Active Interventions: Assess patient/caregiver ability to  obtain necessary supplies Assess patient/caregiver ability to perform ulcer/skin care regimen upon admission and as needed Assess ulceration(s) every visit Notes: Electronic Signature(s) Signed: 06/02/2016 5:21:18 PM By: Montey Hora Entered By: Montey Hora on 06/02/2016 13:32:08 [name] ([id number]) -------------------------------------------------------------------------------- Pain Assessment  Details Patient Name: [name] Date of Service: 06/02/2016 1:15 PM Medical Record Number: [id number] Patient Account Number: [id number] Date of Birth/Sex: [birth year] (83 y.o. Female) Treating RN: Montey Hora Primary Care Physician: Clayborn Bigness Other Clinician: Referring Physician: Clayborn Bigness Treating Physician/Extender: Frann Rider in Treatment: 4 Active Problems Location of Pain Severity and Description of Pain Patient Has Paino Yes Site Locations Pain Location: Pain in Ulcers With Dressing Change: Yes Duration of the Pain. Constant / Intermittento Constant Pain Management and Medication Current Pain Management: Notes Topical or injectable lidocaine is offered to patient for acute pain when surgical debridement is performed. If needed, Patient is instructed to use over the counter pain medication for the following 24-48 hours after debridement. Wound care MDs do not prescribed pain medications. Patient has chronic pain or uncontrolled pain. Patient has been instructed to make an appointment with their Primary Care Physician for pain management. Electronic Signature(s) Signed: 06/02/2016 5:21:18 PM By: Montey Hora Entered By: Montey Hora on 06/02/2016 13:16:16 [name] ([id number]) -------------------------------------------------------------------------------- Patient/Caregiver Education Details Patient Name: [name] Date of Service: 06/02/2016 1:15 PM Medical Record Number: [id number] Patient Account Number: [id number] Date of Birth/Gender: Oct 15, 1928 (83 y.o. Female) Treating RN: Montey Hora Primary Care Physician: Clayborn Bigness Other Clinician: Referring Physician: Clayborn Bigness Treating Physician/Extender: Frann Rider in Treatment: 4 Education Assessment Education Provided To: Patient and Caregiver Education Topics Provided Wound/Skin Impairment: Handouts: Other: wound care as ordered Methods: Demonstration,  Explain/Verbal Responses: State content correctly Electronic Signature(s) Signed: 06/02/2016 5:21:18 PM By: Montey Hora Entered By: Montey Hora on 06/02/2016 14:02:17 [name] ([id number]) -------------------------------------------------------------------------------- Wound Assessment Details Patient Name: [name] Date of Service: 06/02/2016 1:15 PM Medical Record Number: [id number] Patient Account Number: [id number] Date of Birth/Sex: 07/10/1928 (83 y.o. Female) Treating RN: Montey Hora Primary Care Physician: Clayborn Bigness Other Clinician: Referring Physician: Clayborn Bigness Treating Physician/Extender: Frann Rider in Treatment: 4 Wound Status Wound Number: 2 Primary Etiology: Trauma, Other Wound Location: Left, Dorsal Foot Wound Status: Healed - Epithelialized Wounding Event: Trauma Comorbid History: Asthma, Hypertension Date Acquired: 04/21/2016 Weeks Of Treatment: 4 Clustered Wound: No Photos Wound Measurements Length: (cm) 0 Width: (cm) 0 Depth: (cm) 0 Area: (cm) 0 Volume: (cm) 0 % Reduction in Area: 100% % Reduction in Volume: 100% Epithelialization: Large (67-100%) Tunneling: No Undermining: No Wound Description Classification: Partial Thickness Foul Odor Af Wound Margin: Flat and Intact Exudate Amount: None Present ter Cleansing: No Wound Bed Granulation Amount: Large (67-100%) Exposed Structure Granulation Quality: Red Fascia Exposed: No Necrotic Amount: None Present (0%) Fat Layer Exposed: No Tendon Exposed: No Muscle Exposed: No Joint Exposed: No Bone Exposed: No Burby, Keeya ([id number]) Limited to Skin Breakdown Periwound Skin Texture Texture Color No Abnormalities Noted: No No Abnormalities Noted: No Callus: No Atrophie Blanche: No Crepitus: No Cyanosis: No Excoriation: No Ecchymosis: No Fluctuance: No Erythema: Yes Friable: No Erythema Location: Circumferential Induration: No Hemosiderin  Staining: No Localized Edema: No Mottled: No Rash: No Pallor: No Scarring: No Rubor: No Moisture Temperature / Pain No Abnormalities Noted: No Temperature: No Abnormality Dry / Scaly: No Tenderness on Palpation: Yes Maceration: No Moist: Yes Wound Preparation Ulcer Cleansing: Rinsed/Irrigated with Saline Topical Anesthetic Applied: Other: lidocaine 4%, Electronic Signature(s) Signed: 06/02/2016 5:21:18 PM By: Marjory Lies,  Di Kindle Entered By: Montey Hora on 06/02/2016 13:46:10 [name] ([id number]) -------------------------------------------------------------------------------- Wound Assessment Details Patient Name: DANALYNN, BERHOW Date of Service: 06/02/2016 1:15 PM Medical Record Number: [id number] Patient Account Number: [id number] Date of Birth/Sex: 12/21/1928 (83 y.o. Female) Treating RN: Montey Hora Primary Care Physician: Clayborn Bigness Other Clinician: Referring Physician: Clayborn Bigness Treating Physician/Extender: Frann Rider in Treatment: 4 Wound Status Wound Number: 4 Primary Etiology: Trauma, Other Wound Location: Right Lower Leg - Anterior Wound Status: Open Wounding Event: Trauma Comorbid History: Asthma, Hypertension Date Acquired: 04/21/2016 Weeks Of Treatment: 2 Clustered Wound: No Photos Wound Measurements Length: (cm) 11 Width: (cm) 1.4 Depth: (cm) 1.2 Area: (cm) 12.095 Volume: (cm) 14.514 % Reduction in Area: 58.2% % Reduction in Volume: 70.5% Epithelialization: None Tunneling: No Undermining: No Wound Description Full Thickness Without Exposed Classification: Support Structures Wound Margin: Flat and Intact Exudate Large Amount: Exudate Type: Serous Exudate Color: amber Foul Odor After Cleansing: No Wound Bed Granulation Amount: Small (1-33%) Exposed Structure Granulation Quality: Red Fascia Exposed: No Necrotic Amount: Large (67-100%) Fat Layer Exposed: No Rayos, Rukiya ([id number]) Necrotic Quality:  Eschar, Adherent Slough Tendon Exposed: No Muscle Exposed: No Joint Exposed: No Bone Exposed: No Limited to Skin Breakdown Periwound Skin Texture Texture Color No Abnormalities Noted: No No Abnormalities Noted: No Callus: No Atrophie Blanche: No Crepitus: No Cyanosis: No Excoriation: No Ecchymosis: No Fluctuance: No Erythema: Yes Friable: No Erythema Location: Circumferential Induration: No Hemosiderin Staining: No Localized Edema: No Mottled: No Rash: No Pallor: No Scarring: No Rubor: No Moisture Temperature / Pain No Abnormalities Noted: No Tenderness on Palpation: Yes Dry / Scaly: No Maceration: No Moist: Yes Wound Preparation Ulcer Cleansing: Rinsed/Irrigated with Saline Topical Anesthetic Applied: Other: lidocaine 4%, Treatment Notes Wound #4 (Right, Anterior Lower Leg) 1. Cleansed with: Clean wound with Normal Saline 2. Anesthetic Topical Lidocaine 4% cream to wound bed prior to debridement 4. Dressing Applied: Santyl Ointment Plain packing gauze 5. Secondary Dressing Applied ABD and Kerlix/Conform 7. Secured with Recruitment consultant) Signed: 06/02/2016 5:21:18 PM By: Montey Hora Entered By: Montey Hora on 06/02/2016 13:30:03 MAURIA, SHONTZ ([id number]) SHRADHA, FERRARE ([id number]) -------------------------------------------------------------------------------- Vitals Details Patient Name: [name] Date of Service: 06/02/2016 1:15 PM Medical Record Number: [id number] Patient Account Number: [id number] Date of Birth/Sex: 1929-04-22 (83 y.o. Female) Treating RN: Montey Hora Primary Care Physician: Clayborn Bigness Other Clinician: Referring Physician: Clayborn Bigness Treating Physician/Extender: Frann Rider in Treatment: 4 Vital Signs Time Taken: 13:19 Temperature (F): 98.0 Height (in): 60 Pulse (bpm): 70 Weight (lbs): 122 Respiratory Rate (breaths/min): 18 Body Mass Index (BMI): 23.8 Blood Pressure (mmHg):  171/61 Reference Range: 80 - 120 mg / dl Electronic Signature(s) Signed: 06/02/2016 5:21:18 PM By: Montey Hora Entered By: Montey Hora on 06/02/2016 13:19:39

## 2016-06-03 NOTE — Progress Notes (Signed)
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 fat layer exposed L97.522 Non-pressure chronic ulcer of other part of left foot with fat layer exposed C44.722 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, including hip C44.729 Squamous cell carcinoma of skin of left lower limb, including hip Facility Procedures CPT4 Code Description: [id number] - DEB SUBQ TISSUE 20 SQ CM/< ICD-10 Description Diagnosis S81.811A Laceration without foreign body, right lower leg, init 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

## 2016-06-08 ENCOUNTER — Encounter: Payer: Medicare Other | Admitting: Surgery

## 2016-06-08 DIAGNOSIS — C44722 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, including hip: Secondary | ICD-10-CM | POA: Diagnosis not present
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
## 2016-06-09 NOTE — Progress Notes (Signed)
[name], [name] ([id number]) Visit Report for 06/08/2016 Arrival Information Details Patient Name: [name] Date of Service: 06/08/2016 3:15 PM Medical Record Number: [id number] Patient Account Number: [id number] Date of Birth/Sex: [birth year] (83 y.o. Female) Treating RN: Montey Hora Primary Care Physician: Clayborn Bigness Other Clinician: Referring Physician: Clayborn Bigness Treating Physician/Extender: Frann Rider in Treatment: 5 Visit Information History Since Last Visit Added or deleted any medications: No Patient Arrived: Ambulatory Any new allergies or adverse reactions: No Arrival Time: 15:14 Had a fall or experienced change in No Accompanied By: dtr activities of daily living that may affect Transfer Assistance: None risk of falls: Patient Identification Verified: Yes Signs or symptoms of abuse/neglect since last No Secondary Verification Process Yes visito Completed: Hospitalized since last visit: No Patient Requires Transmission-Based No Pain Present Now: Yes Precautions: Patient Has Alerts: No Electronic Signature(s) Signed: 06/08/2016 5:34:24 PM By: Montey Hora Entered By: Montey Hora on 06/08/2016 15:19:42 [name] ([id number]) -------------------------------------------------------------------------------- Encounter Discharge Information Details Patient Name: [name] Date of Service: 06/08/2016 3:15 PM Medical Record Number: [id number] Patient Account Number: [id number] Date of Birth/Sex: 1928/06/07 (83 y.o. Female) Treating RN: Montey Hora Primary Care Physician: Clayborn Bigness Other Clinician: Referring Physician: Clayborn Bigness Treating Physician/Extender: Frann Rider in Treatment: 5 Encounter Discharge Information Items Discharge Pain Level: 0 Discharge Condition: Stable Ambulatory Status: Ambulatory Discharge Destination: Home Transportation: Private Auto Accompanied By: dtr Schedule Follow-up  Appointment: Yes Medication Reconciliation completed and provided to Patient/Care No [name]: Provided on Clinical Summary of Care: 06/08/2016 Form Type Recipient Paper Patient LP Electronic Signature(s) Signed: 06/08/2016 4:03:16 PM By: Ruthine Dose Entered By: Ruthine Dose on 06/08/2016 16:03:16 [name] ([id number]) -------------------------------------------------------------------------------- Lower Extremity Assessment Details Patient Name: [name] Date of Service: 06/08/2016 3:15 PM Medical Record Number: [id number] Patient Account Number: [id number] Date of Birth/Sex: 1928/06/02 (83 y.o. Female) Treating RN: Montey Hora Primary Care Physician: Clayborn Bigness Other Clinician: Referring Physician: Clayborn Bigness Treating Physician/Extender: Frann Rider in Treatment: 5 Edema Assessment Assessed: [Left: No] [Right: No] Edema: [Left: Ye] [Right: s] Vascular Assessment Pulses: Dorsalis Pedis Palpable: [Right:Yes] Posterior Tibial Extremity colors, hair growth, and conditions: Extremity Color: [Right:Mottled] Hair Growth on Extremity: [Right:No] Temperature of Extremity: [Right:Warm] Capillary Refill: [Right:< 3 seconds] Electronic Signature(s) Signed: 06/08/2016 5:34:24 PM By: Montey Hora Entered By: Montey Hora on 06/08/2016 15:31:37 [name], [name] ([id number]) -------------------------------------------------------------------------------- Multi Wound Chart Details Patient Name: [name] Date of Service: 06/08/2016 3:15 PM Medical Record Number: [id number] Patient Account Number: [id number] Date of Birth/Sex: 12-23-1928 (83 y.o. Female) Treating RN: Montey Hora Primary Care Physician: Clayborn Bigness Other Clinician: Referring Physician: Clayborn Bigness Treating Physician/Extender: Frann Rider in Treatment: 5 Vital Signs Height(in): 60 Pulse(bpm): 74 Weight(lbs): 122 Blood Pressure 156/56 (mmHg): Body Mass  Index(BMI): 24 Temperature(F): 97.5 Respiratory Rate 18 (breaths/min): Photos: [N/A:N/A] Wound Location: Right Lower Leg - Anterior N/A N/A Wounding Event: Trauma N/A N/A Primary Etiology: Trauma, Other N/A N/A Comorbid History: Asthma, Hypertension N/A N/A Date Acquired: 04/21/2016 N/A N/A Weeks of Treatment: 3 N/A N/A Wound Status: Open N/A N/A Measurements L x W x D 11x1.3x1.2 N/A N/A (cm) Area (cm) : 11.231 N/A N/A Volume (cm) : 13.477 N/A N/A % Reduction in Area: 61.10% N/A N/A % Reduction in Volume: 72.60% N/A N/A Classification: Full Thickness Without N/A N/A Exposed Support Structures Exudate Amount: Large N/A N/A Exudate Type: Serous N/A N/A Exudate Color: amber N/A N/A Wound Margin: Flat and Intact N/A N/A Granulation Amount: Small (1-33%) N/A N/A Granulation Quality: Red N/A N/A Necrotic Amount:  Large (67-100%) N/A N/A [name], [name] ([id number]) Necrotic Tissue: Eschar, Adherent Slough N/A N/A Exposed Structures: Fascia: No N/A N/A Fat: No Tendon: No Muscle: No Joint: No Bone: No Limited to Skin Breakdown Epithelialization: None N/A N/A Debridement: Debridement [id number] N/A N/A [id number]) Pre-procedure 15:42 N/A N/A Verification/Time Out Taken: Pain Control: Lidocaine 4% Topical N/A N/A Solution Tissue Debrided: Fibrin/Slough, N/A N/A Subcutaneous Level: Skin/Subcutaneous N/A N/A Tissue Debridement Area (sq 14.3 N/A N/A cm): Instrument: Curette N/A N/A Bleeding: Minimum N/A N/A Hemostasis Achieved: Pressure N/A N/A Procedural Pain: 0 N/A N/A Post Procedural Pain: 0 N/A N/A Debridement Treatment Procedure was tolerated N/A N/A Response: well Post Debridement 11x1.3x1.3 N/A N/A Measurements L x W x D (cm) Post Debridement 14.601 N/A N/A Volume: (cm) Periwound Skin Texture: Edema: No N/A N/A Excoriation: No Induration: No Callus: No Crepitus: No Fluctuance: No Friable: No Rash: No Scarring: No Periwound Skin Moist: Yes N/A  N/A Moisture: Maceration: No Dry/Scaly: No Periwound Skin Color: Erythema: Yes N/A N/A Atrophie Blanche: No Cyanosis: No [name] ([id number]) Ecchymosis: No Hemosiderin Staining: No Mottled: No Pallor: No Rubor: No Erythema Location: Circumferential N/A N/A Tenderness on Yes N/A N/A Palpation: Wound Preparation: Ulcer Cleansing: N/A N/A Rinsed/Irrigated with Saline Topical Anesthetic Applied: Other: lidocaine 4% Procedures Performed: Debridement N/A N/A Treatment Notes Wound #4 (Right, Anterior Lower Leg) 1. Cleansed with: Clean wound with Normal Saline 2. Anesthetic Topical Lidocaine 4% cream to wound bed prior to debridement 4. Dressing Applied: Santyl Ointment Plain packing gauze 5. Secondary Dressing Applied Non-Adherent pad ABD and Kerlix/Conform 7. Secured with Recruitment consultant) Signed: 06/08/2016 3:50:24 PM By: Christin Fudge MD, FACS Entered By: Christin Fudge on 06/08/2016 15:50:24 [name], [name] ([id number]) -------------------------------------------------------------------------------- [place] Details Patient Name: [name] Date of Service: 06/08/2016 3:15 PM Medical Record Number: [id number] Patient Account Number: [id number] Date of Birth/Sex: 08-Oct-1928 (83 y.o. Female) Treating RN: Montey Hora Primary Care Physician: Clayborn Bigness Other Clinician: Referring Physician: Clayborn Bigness Treating Physician/Extender: Frann Rider in Treatment: 5 Active Inactive Abuse / Safety / Falls / Self Care Management Nursing Diagnoses: Impaired physical mobility Potential for falls Goals: Patient will remain injury free Date Initiated: 05/04/2016 Goal Status: Active Interventions: Assess fall risk on admission and as needed Notes: Orientation to the Wound Care Program Nursing Diagnoses: Knowledge deficit related to the wound healing center program Goals: Patient/caregiver will verbalize  understanding of the [facility] Date Initiated: 05/04/2016 Goal Status: Active Interventions: Provide education on orientation to the wound center Notes: Pain, Acute or Chronic Nursing Diagnoses: Pain, acute or chronic: actual or potential Goals: Patient/caregiver will verbalize adequate pain control between visits [name], [name] ([id number]) Date Initiated: 05/04/2016 Goal Status: Active Interventions: Complete pain assessment as per visit requirements Notes: Wound/Skin Impairment Nursing Diagnoses: Impaired tissue integrity Goals: Patient/caregiver will verbalize understanding of skin care regimen Date Initiated: 05/04/2016 Goal Status: Active Ulcer/skin breakdown will have a volume reduction of 30% by week 4 Date Initiated: 05/04/2016 Goal Status: Active Ulcer/skin breakdown will have a volume reduction of 50% by week 8 Date Initiated: 05/04/2016 Goal Status: Active Ulcer/skin breakdown will have a volume reduction of 80% by week 12 Date Initiated: 05/04/2016 Goal Status: Active Ulcer/skin breakdown will heal within 14 weeks Date Initiated: 05/04/2016 Goal Status: Active Interventions: Assess patient/caregiver ability to obtain necessary supplies Assess patient/caregiver ability to perform ulcer/skin care regimen upon admission and as needed Assess ulceration(s) every visit Notes: Electronic Signature(s) Signed: 06/08/2016 5:34:24 PM By: Montey Hora Entered By: Montey Hora on 06/08/2016  15:31:46 [name], [name] ([id number]) -------------------------------------------------------------------------------- Pain Assessment Details Patient Name: [name], [name] Date of Service: 06/08/2016 3:15 PM Medical Record Number: [id number] Patient Account Number: [id number] Date of Birth/Sex: Mar 26, 1929 (83 y.o. Female) Treating RN: Montey Hora Primary Care Physician: Clayborn Bigness Other Clinician: Referring Physician: Clayborn Bigness Treating  Physician/Extender: Frann Rider in Treatment: 5 Active Problems Location of Pain Severity and Description of Pain Patient Has Paino Yes Site Locations Pain Location: Pain in Ulcers With Dressing Change: Yes Duration of the Pain. Constant / Intermittento Constant Pain Management and Medication Current Pain Management: Notes Topical or injectable lidocaine is offered to patient for acute pain when surgical debridement is performed. If needed, Patient is instructed to use over the counter pain medication for the following 24-48 hours after debridement. Wound care MDs do not prescribed pain medications. Patient has chronic pain or uncontrolled pain. Patient has been instructed to make an appointment with their Primary Care Physician for pain management. Electronic Signature(s) Signed: 06/08/2016 5:34:24 PM By: Montey Hora Entered By: Montey Hora on 06/08/2016 15:20:01 [name] ([id number]) -------------------------------------------------------------------------------- Patient/Caregiver Education Details Patient Name: [name] Date of Service: 06/08/2016 3:15 PM Medical Record Number: [id number] Patient Account Number: [id number] Date of Birth/Gender: Sep 20, 1928 (83 y.o. Female) Treating RN: Montey Hora Primary Care Physician: Clayborn Bigness Other Clinician: Referring Physician: Clayborn Bigness Treating Physician/Extender: Frann Rider in Treatment: 5 Education Assessment Education Provided To: Patient and Caregiver Education Topics Provided Wound/Skin Impairment: Handouts: Other: wound care as ordered Methods: Demonstration, Explain/Verbal Responses: State content correctly Electronic Signature(s) Signed: 06/08/2016 5:34:24 PM By: Montey Hora Entered By: Montey Hora on 06/08/2016 15:43:50 [name], [name] ([id number]) -------------------------------------------------------------------------------- Wound Assessment Details Patient  Name: [name] Date of Service: 06/08/2016 3:15 PM Medical Record Number: [id number] Patient Account Number: [id number] Date of Birth/Sex: 05-Jun-1928 (83 y.o. Female) Treating RN: Montey Hora Primary Care Physician: Clayborn Bigness Other Clinician: Referring Physician: Clayborn Bigness Treating Physician/Extender: Frann Rider in Treatment: 5 Wound Status Wound Number: 4 Primary Etiology: Trauma, Other Wound Location: Right Lower Leg - Anterior Wound Status: Open Wounding Event: Trauma Comorbid History: Asthma, Hypertension Date Acquired: 04/21/2016 Weeks Of Treatment: 3 Clustered Wound: No Photos Wound Measurements Length: (cm) 11 Width: (cm) 1.3 Depth: (cm) 1.2 Area: (cm) 11.231 Volume: (cm) 13.477 % Reduction in Area: 61.1% % Reduction in Volume: 72.6% Epithelialization: None Tunneling: No Undermining: No Wound Description Full Thickness Without Exposed Classification: Support Structures Wound Margin: Flat and Intact Exudate Large Amount: Exudate Type: Serous Exudate Color: amber Foul Odor After Cleansing: No Wound Bed Granulation Amount: Small (1-33%) Exposed Structure Granulation Quality: Red Fascia Exposed: No Necrotic Amount: Large (67-100%) Fat Layer Exposed: No [name], [name] ([id number]) Necrotic Quality: Eschar, Adherent Slough Tendon Exposed: No Muscle Exposed: No Joint Exposed: No Bone Exposed: No Limited to Skin Breakdown Periwound Skin Texture Texture Color No Abnormalities Noted: No No Abnormalities Noted: No Callus: No Atrophie Blanche: No Crepitus: No Cyanosis: No Excoriation: No Ecchymosis: No Fluctuance: No Erythema: Yes Friable: No Erythema Location: Circumferential Induration: No Hemosiderin Staining: No Localized Edema: No Mottled: No Rash: No Pallor: No Scarring: No Rubor: No Moisture Temperature / Pain No Abnormalities Noted: No Tenderness on Palpation: Yes Dry / Scaly: No Maceration: No Moist:  Yes Wound Preparation Ulcer Cleansing: Rinsed/Irrigated with Saline Topical Anesthetic Applied: Other: lidocaine 4%, Treatment Notes Wound #4 (Right, Anterior Lower Leg) 1. Cleansed with: Clean wound with Normal Saline 2. Anesthetic Topical Lidocaine 4% cream to wound bed prior to debridement 4. Dressing Applied: Santyl Ointment Plain packing gauze  5. Secondary Dressing Applied Non-Adherent pad ABD and Kerlix/Conform 7. Secured with Recruitment consultant) Signed: 06/08/2016 5:34:24 PM By: Montey Hora Entered By: Montey Hora on 06/08/2016 15:31:11 [name], [name] ([id number]) [name], [name] ([id number]) -------------------------------------------------------------------------------- Vitals Details Patient Name: [name] Date of Service: 06/08/2016 3:15 PM Medical Record Number: [id number] Patient Account Number: [id number] Date of Birth/Sex: 02/08/1929 (83 y.o. Female) Treating RN: Montey Hora Primary Care Physician: Clayborn Bigness Other Clinician: Referring Physician: Clayborn Bigness Treating Physician/Extender: Frann Rider in Treatment: 5 Vital Signs Time Taken: 15:20 Temperature (F): 97.5 Height (in): 60 Pulse (bpm): 74 Weight (lbs): 122 Respiratory Rate (breaths/min): 18 Body Mass Index (BMI): 23.8 Blood Pressure (mmHg): 156/56 Reference Range: 80 - 120 mg / dl Electronic Signature(s) Signed: 06/08/2016 5:34:24 PM By: Montey Hora Entered By: Montey Hora on 06/08/2016 15:20:20

## 2016-06-16 ENCOUNTER — Encounter: Payer: Medicare Other | Admitting: Surgery

## 2016-06-16 DIAGNOSIS — C44722 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, including hip: Secondary | ICD-10-CM | POA: Diagnosis not present

## 2016-06-17 NOTE — Progress Notes (Signed)
VI, CLABORN ([id number]) Visit Report for 06/16/2016 Arrival Information Details Patient Name: [name], [name] Date of Service: 06/16/2016 1:30 PM Medical Record Number: [id number] Patient Account Number: [id number] Date of Birth/Sex: 1929-03-24 (83 y.o. Female) Treating RN: Montey Hora Primary Care Physician: Clayborn Bigness Other Clinician: Referring Physician: Clayborn Bigness Treating Physician/Extender: Frann Rider in Treatment: 6 Visit Information History Since Last Visit Added or deleted any medications: No Patient Arrived: Ambulatory Any new allergies or adverse reactions: No Arrival Time: 13:40 Had a fall or experienced change in No Accompanied By: dtr activities of daily living that may affect Transfer Assistance: None risk of falls: Patient Identification Verified: Yes Signs or symptoms of abuse/neglect since last No Secondary Verification Process Yes visito Completed: Hospitalized since last visit: No Patient Requires Transmission-Based No Has Dressing in Place as Prescribed: Yes Precautions: Pain Present Now: Yes Patient Has Alerts: No Electronic Signature(s) Signed: 06/16/2016 4:37:54 PM By: Montey Hora Entered By: Montey Hora on 06/16/2016 13:40:32 [name] ([id number]) -------------------------------------------------------------------------------- Encounter Discharge Information Details Patient Name: [name] Date of Service: 06/16/2016 1:30 PM Medical Record Number: [id number] Patient Account Number: [id number] Date of Birth/Sex: November 24, 1928 (83 y.o. Female) Treating RN: Montey Hora Primary Care Physician: Clayborn Bigness Other Clinician: Referring Physician: Clayborn Bigness Treating Physician/Extender: Frann Rider in Treatment: 6 Encounter Discharge Information Items Discharge Pain Level: 0 Discharge Condition: Stable Ambulatory Status: Ambulatory Discharge Destination: Home Transportation: Private  Auto Accompanied By: dtr Schedule Follow-up Appointment: Yes Medication Reconciliation completed and provided to Patient/Care No Destani Wamser: Provided on Clinical Summary of Care: 06/16/2016 Form Type Recipient Paper Patient LP Electronic Signature(s) Signed: 06/16/2016 2:25:14 PM By: Ruthine Dose Entered By: Ruthine Dose on 06/16/2016 14:25:14 [name] ([id number]) -------------------------------------------------------------------------------- Lower Extremity Assessment Details Patient Name: [name] Date of Service: 06/16/2016 1:30 PM Medical Record Number: [id number] Patient Account Number: [id number] Date of Birth/Sex: May 13, 1929 (83 y.o. Female) Treating RN: Montey Hora Primary Care Physician: Clayborn Bigness Other Clinician: Referring Physician: Clayborn Bigness Treating Physician/Extender: Frann Rider in Treatment: 6 Edema Assessment Assessed: [Left: No] [Right: No] Edema: [Left: Ye] [Right: s] Vascular Assessment Pulses: Dorsalis Pedis Palpable: [Right:Yes] Posterior Tibial Extremity colors, hair growth, and conditions: Extremity Color: [Right:Hyperpigmented] Hair Growth on Extremity: [Right:No] Temperature of Extremity: [Right:Warm] Capillary Refill: [Right:< 3 seconds] Electronic Signature(s) Signed: 06/16/2016 4:37:54 PM By: Montey Hora Entered By: Montey Hora on 06/16/2016 13:52:47 [name] ([id number]) -------------------------------------------------------------------------------- Multi Wound Chart Details Patient Name: [name] Date of Service: 06/16/2016 1:30 PM Medical Record Number: [id number] Patient Account Number: [id number] Date of Birth/Sex: Mar 13, 1929 (83 y.o. Female) Treating RN: Montey Hora Primary Care Physician: Clayborn Bigness Other Clinician: Referring Physician: Clayborn Bigness Treating Physician/Extender: Frann Rider in Treatment: 6 Vital Signs Height(in): 60 Pulse(bpm): 76 Weight(lbs):  122 Blood Pressure 162/52 (mmHg): Body Mass Index(BMI): 24 Temperature(F): 97.6 Respiratory Rate 18 (breaths/min): Photos: [N/A:N/A] Wound Location: Right Lower Leg - Anterior N/A N/A Wounding Event: Trauma N/A N/A Primary Etiology: Trauma, Other N/A N/A Comorbid History: Asthma, Hypertension N/A N/A Date Acquired: 04/21/2016 N/A N/A Weeks of Treatment: 4 N/A N/A Wound Status: Open N/A N/A Measurements L x W x D 10.8x1.2x1.2 N/A N/A (cm) Area (cm) : 10.179 N/A N/A Volume (cm) : 12.215 N/A N/A % Reduction in Area: 64.80% N/A N/A % Reduction in Volume: 75.10% N/A N/A Classification: Full Thickness Without N/A N/A Exposed Support Structures Exudate Amount: Large N/A N/A Exudate Type: Serous N/A N/A Exudate Color: amber N/A N/A Wound Margin: Flat and Intact N/A N/A Granulation Amount: Small (1-33%) N/A N/A  Granulation Quality: Red N/A N/A Necrotic Amount: Large (67-100%) N/A N/A [name] ([id number]) Necrotic Tissue: Eschar, Adherent Slough N/A N/A Exposed Structures: Fascia: No N/A N/A Fat: No Tendon: No Muscle: No Joint: No Bone: No Limited to Skin Breakdown Epithelialization: None N/A N/A Debridement: Debridement [id number] N/A N/A [id number]) Pre-procedure 14:03 N/A N/A Verification/Time Out Taken: Pain Control: Lidocaine 4% Topical N/A N/A Solution Tissue Debrided: Fibrin/Slough, N/A N/A Subcutaneous Level: Skin/Subcutaneous N/A N/A Tissue Debridement Area (sq 12.96 N/A N/A cm): Instrument: Curette N/A N/A Bleeding: Minimum N/A N/A Hemostasis Achieved: Pressure N/A N/A Procedural Pain: 0 N/A N/A Post Procedural Pain: 0 N/A N/A Debridement Treatment Procedure was tolerated N/A N/A Response: well Post Debridement 10.8x1.2x1.3 N/A N/A Measurements L x W x D (cm) Post Debridement 13.232 N/A N/A Volume: (cm) Periwound Skin Texture: Edema: No N/A N/A Excoriation: No Induration: No Callus: No Crepitus: No Fluctuance: No Friable: No Rash:  No Scarring: No Periwound Skin Moist: Yes N/A N/A Moisture: Maceration: No Dry/Scaly: No Periwound Skin Color: Erythema: Yes N/A N/A Atrophie Blanche: No Cyanosis: No Frech, [name] ([id number]) Ecchymosis: No Hemosiderin Staining: No Mottled: No Pallor: No Rubor: No Erythema Location: Circumferential N/A N/A Tenderness on Yes N/A N/A Palpation: Wound Preparation: Ulcer Cleansing: N/A N/A Rinsed/Irrigated with Saline Topical Anesthetic Applied: Other: lidocaine 4% Procedures Performed: Debridement N/A N/A Treatment Notes Wound #4 (Right, Anterior Lower Leg) 1. Cleansed with: Clean wound with Normal Saline 2. Anesthetic Topical Lidocaine 4% cream to wound bed prior to debridement 4. Dressing Applied: Santyl Ointment 5. Secondary Dressing Applied ABD Pad Kerlix/Conform Non-Adherent pad 7. Secured with Recruitment consultant) Signed: 06/16/2016 2:13:58 PM By: Christin Fudge MD, FACS Entered By: Christin Fudge on 06/16/2016 14:13:58 SHERRI, DINELLI ([id number]) -------------------------------------------------------------------------------- [place] Details Patient Name: [name] Date of Service: 06/16/2016 1:30 PM Medical Record Number: [id number] Patient Account Number: [id number] Date of Birth/Sex: 23-Apr-1929 (83 y.o. Female) Treating RN: Montey Hora Primary Care Physician: Clayborn Bigness Other Clinician: Referring Physician: Clayborn Bigness Treating Physician/Extender: Frann Rider in Treatment: 6 Active Inactive Abuse / Safety / Falls / Self Care Management Nursing Diagnoses: Impaired physical mobility Potential for falls Goals: Patient will remain injury free Date Initiated: 05/04/2016 Goal Status: Active Interventions: Assess fall risk on admission and as needed Notes: Orientation to the Wound Care Program Nursing Diagnoses: Knowledge deficit related to the wound healing center  program Goals: Patient/caregiver will verbalize understanding of the [place] [facility] Date Initiated: 05/04/2016 Goal Status: Active Interventions: Provide education on orientation to the wound center Notes: Pain, Acute or Chronic Nursing Diagnoses: Pain, acute or chronic: actual or potential Goals: Patient/caregiver will verbalize adequate pain control between visits LOURENA, LICHT ([id number]) Date Initiated: 05/04/2016 Goal Status: Active Interventions: Complete pain assessment as per visit requirements Notes: Wound/Skin Impairment Nursing Diagnoses: Impaired tissue integrity Goals: Patient/caregiver will verbalize understanding of skin care regimen Date Initiated: 05/04/2016 Goal Status: Active Ulcer/skin breakdown will have a volume reduction of 30% by week 4 Date Initiated: 05/04/2016 Goal Status: Active Ulcer/skin breakdown will have a volume reduction of 50% by week 8 Date Initiated: 05/04/2016 Goal Status: Active Ulcer/skin breakdown will have a volume reduction of 80% by week 12 Date Initiated: 05/04/2016 Goal Status: Active Ulcer/skin breakdown will heal within 14 weeks Date Initiated: 05/04/2016 Goal Status: Active Interventions: Assess patient/caregiver ability to obtain necessary supplies Assess patient/caregiver ability to perform ulcer/skin care regimen upon admission and as needed Assess ulceration(s) every visit Notes: Electronic Signature(s) Signed: 06/16/2016 4:37:54 PM By: Montey Hora Entered By:  Montey Hora on 06/16/2016 13:53:07 BRESHAWN, CITRANO ([id number]) -------------------------------------------------------------------------------- Pain Assessment Details Patient Name: KAYTI, VACCARELLO Date of Service: 06/16/2016 1:30 PM Medical Record Number: [id number] Patient Account Number: [id number] Date of Birth/Sex: 1928-10-05 (83 y.o. Female) Treating RN: Montey Hora Primary Care Physician: Clayborn Bigness Other  Clinician: Referring Physician: Clayborn Bigness Treating Physician/Extender: Frann Rider in Treatment: 6 Active Problems Location of Pain Severity and Description of Pain Patient Has Paino Yes Site Locations Pain Location: Pain in Ulcers With Dressing Change: Yes Duration of the Pain. Constant / Intermittento Constant Pain Management and Medication Current Pain Management: Notes Topical or injectable lidocaine is offered to patient for acute pain when surgical debridement is performed. If needed, Patient is instructed to use over the counter pain medication for the following 24-48 hours after debridement. Wound care MDs do not prescribed pain medications. Patient has chronic pain or uncontrolled pain. Patient has been instructed to make an appointment with their Primary Care Physician for pain management. Electronic Signature(s) Signed: 06/16/2016 4:37:54 PM By: Montey Hora Entered By: Montey Hora on 06/16/2016 13:40:44 [name] ([id number]) -------------------------------------------------------------------------------- Patient/Caregiver Education Details Patient Name: [name] Date of Service: 06/16/2016 1:30 PM Medical Record Number: [id number] Patient Account Number: [id number] Date of Birth/Gender: 1928-08-26 (83 y.o. Female) Treating RN: Montey Hora Primary Care Physician: Clayborn Bigness Other Clinician: Referring Physician: Clayborn Bigness Treating Physician/Extender: Frann Rider in Treatment: 6 Education Assessment Education Provided To: Patient and Caregiver Education Topics Provided Wound/Skin Impairment: Handouts: Other: wund care as ordered Methods: Demonstration, Explain/Verbal Responses: State content correctly Electronic Signature(s) Signed: 06/16/2016 4:37:54 PM By: Montey Hora Entered By: Montey Hora on 06/16/2016 14:02:49 [name]  ([id number]) -------------------------------------------------------------------------------- Wound Assessment Details Patient Name: [name] Date of Service: 06/16/2016 1:30 PM Medical Record Number: [id number] Patient Account Number: [id number] Date of Birth/Sex: 1929/03/01 (83 y.o. Female) Treating RN: Montey Hora Primary Care Physician: Clayborn Bigness Other Clinician: Referring Physician: Clayborn Bigness Treating Physician/Extender: Frann Rider in Treatment: 6 Wound Status Wound Number: 4 Primary Etiology: Trauma, Other Wound Location: Right Lower Leg - Anterior Wound Status: Open Wounding Event: Trauma Comorbid History: Asthma, Hypertension Date Acquired: 04/21/2016 Weeks Of Treatment: 4 Clustered Wound: No Photos Wound Measurements Length: (cm) 10.8 Width: (cm) 1.2 Depth: (cm) 1.2 Area: (cm) 10.179 Volume: (cm) 12.215 % Reduction in Area: 64.8% % Reduction in Volume: 75.1% Epithelialization: None Tunneling: No Undermining: No Wound Description Full Thickness Without Exposed Classification: Support Structures Wound Margin: Flat and Intact Exudate Large Amount: Exudate Type: Serous Exudate Color: amber Foul Odor After Cleansing: No Wound Bed Granulation Amount: Small (1-33%) Exposed Structure Granulation Quality: Red Fascia Exposed: No Necrotic Amount: Large (67-100%) Fat Layer Exposed: No Winner, Vauda ([id number]) Necrotic Quality: Eschar, Adherent Slough Tendon Exposed: No Muscle Exposed: No Joint Exposed: No Bone Exposed: No Limited to Skin Breakdown Periwound Skin Texture Texture Color No Abnormalities Noted: No No Abnormalities Noted: No Callus: No Atrophie Blanche: No Crepitus: No Cyanosis: No Excoriation: No Ecchymosis: No Fluctuance: No Erythema: Yes Friable: No Erythema Location: Circumferential Induration: No Hemosiderin Staining: No Localized Edema: No Mottled: No Rash: No Pallor: No Scarring: No Rubor:  No Moisture Temperature / Pain No Abnormalities Noted: No Tenderness on Palpation: Yes Dry / Scaly: No Maceration: No Moist: Yes Wound Preparation Ulcer Cleansing: Rinsed/Irrigated with Saline Topical Anesthetic Applied: Other: lidocaine 4%, Treatment Notes Wound #4 (Right, Anterior Lower Leg) 1. Cleansed with: Clean wound with Normal Saline 2. Anesthetic Topical Lidocaine 4% cream to wound bed prior to debridement 4. Dressing Applied: Santyl  Ointment 5. Secondary Dressing Applied ABD Pad Kerlix/Conform Non-Adherent pad 7. Secured with Recruitment consultant) Signed: 06/16/2016 4:37:54 PM By: Montey Hora Entered By: Montey Hora on 06/16/2016 13:52:11 JIMISHA, ASKELAND ([id number]) MISHELL, NEUMAIER ([id number]) -------------------------------------------------------------------------------- Vitals Details Patient Name: [name] Date of Service: 06/16/2016 1:30 PM Medical Record Number: [id number] Patient Account Number: [id number] Date of Birth/Sex: 1928-08-07 (83 y.o. Female) Treating RN: Montey Hora Primary Care Physician: Clayborn Bigness Other Clinician: Referring Physician: Clayborn Bigness Treating Physician/Extender: Frann Rider in Treatment: 6 Vital Signs Time Taken: 13:43 Temperature (F): 97.6 Height (in): 60 Pulse (bpm): 76 Weight (lbs): 122 Respiratory Rate (breaths/min): 18 Body Mass Index (BMI): 23.8 Blood Pressure (mmHg): 162/52 Reference Range: 80 - 120 mg / dl Electronic Signature(s) Signed: 06/16/2016 4:37:54 PM By: Montey Hora Entered By: Montey Hora on 06/16/2016 13:43:09
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
## 2016-06-18 IMAGING — MR MR LUMBAR SPINE W/O CM
4 of 5 series · 23 of 48 positions shown · non-contrast
Comparison: None.

CLINICAL DATA: Status post fall 4-5 months ago. Low back pain
bilateral leg pain with numbness, burning and tingling. Initial
encounter.

EXAM:
MRI LUMBAR SPINE WITHOUT CONTRAST
TECHNIQUE: Multiplanar, multisequence MR imaging of the lumbar spine was
performed. No intravenous contrast was administered.

[Series 2: T2 · sagittal · 4.0mm · 0.81mm/px · 6 of 19 slices shown (1 of 2)]
[im 1/19]
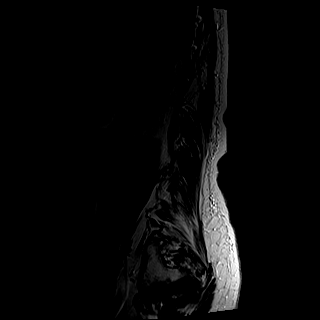
[im 4/19]
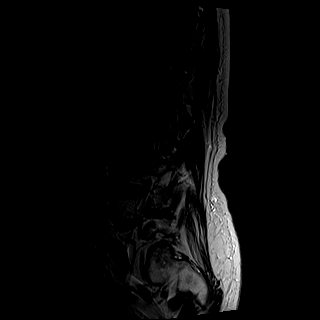
[im 8/19]
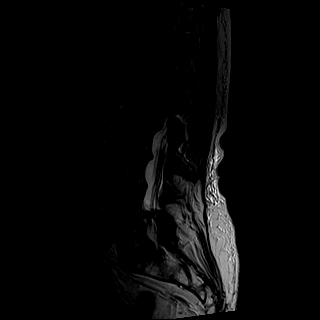
[im 11/19]
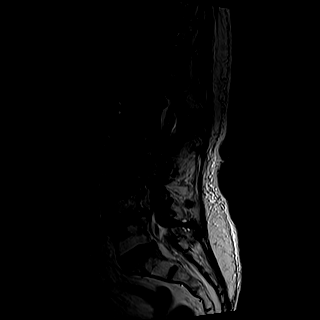
[im 15/19]
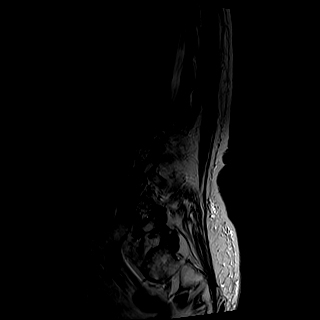
[im 19/19]
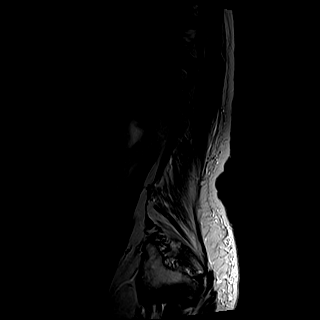

[Series 3: T1 · sagittal · 4.0mm · 0.41mm/px · 6 of 19 slices shown (1 of 2)]
[im 1/19]
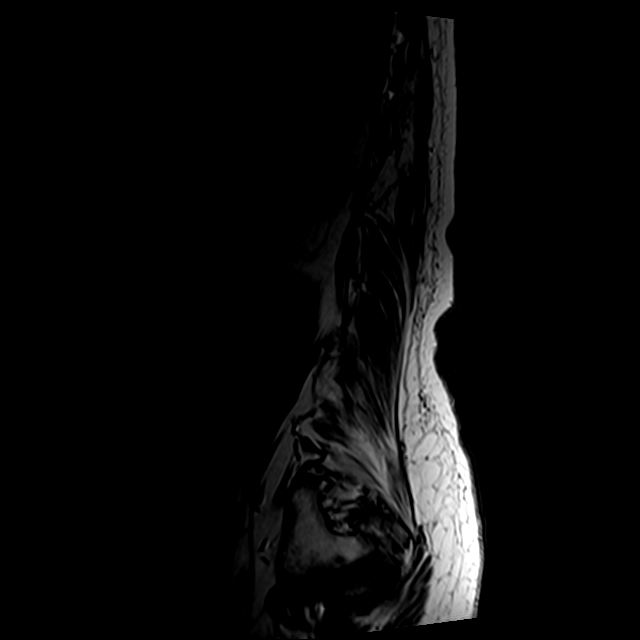
[im 4/19]
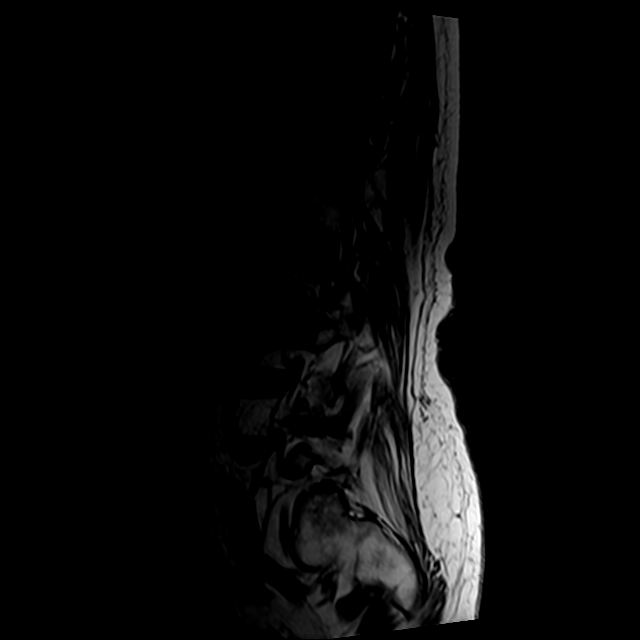
[im 7/19]
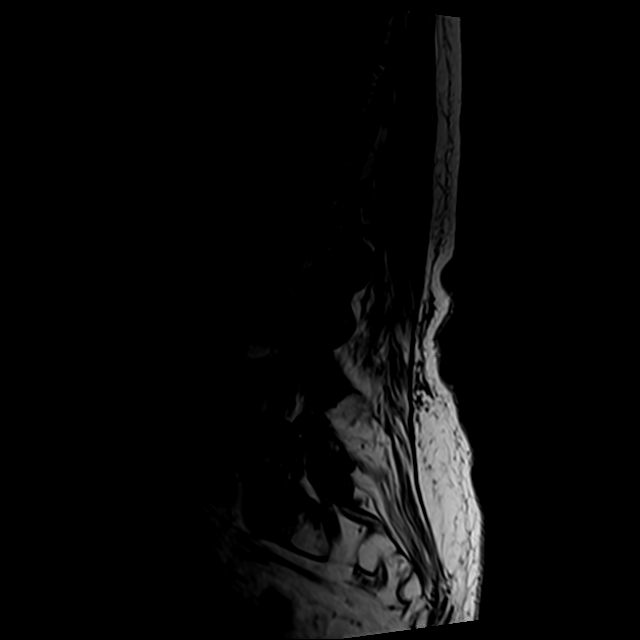
[im 10/19]
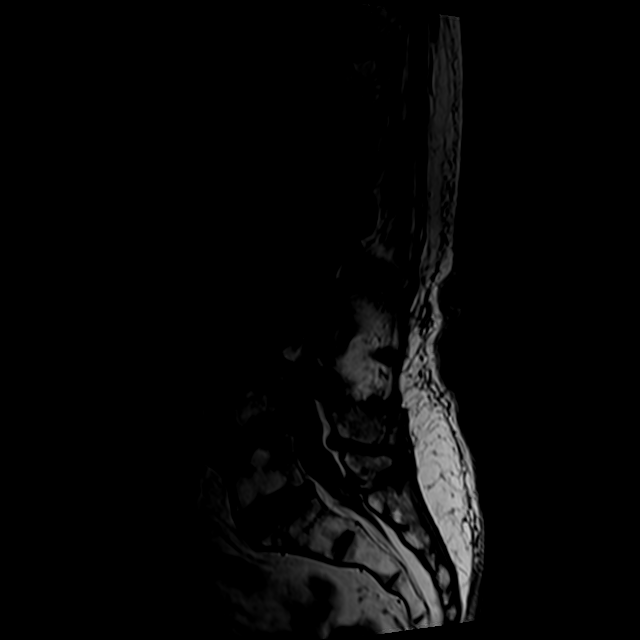
[im 13/19]
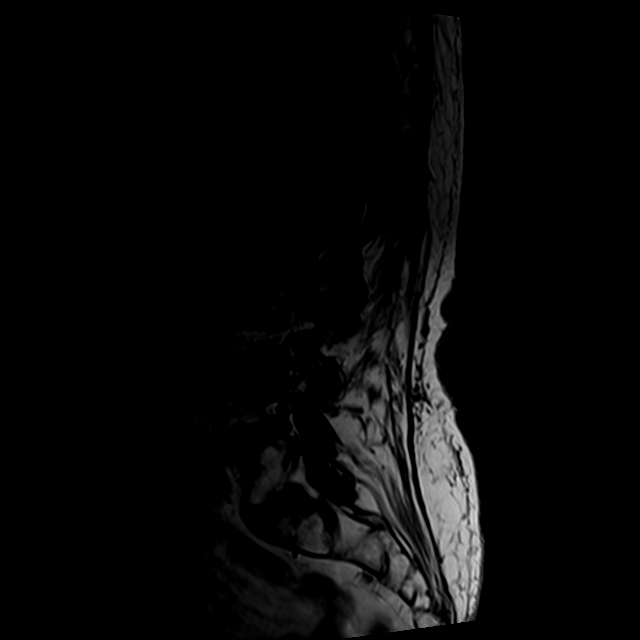
[im 16/19]
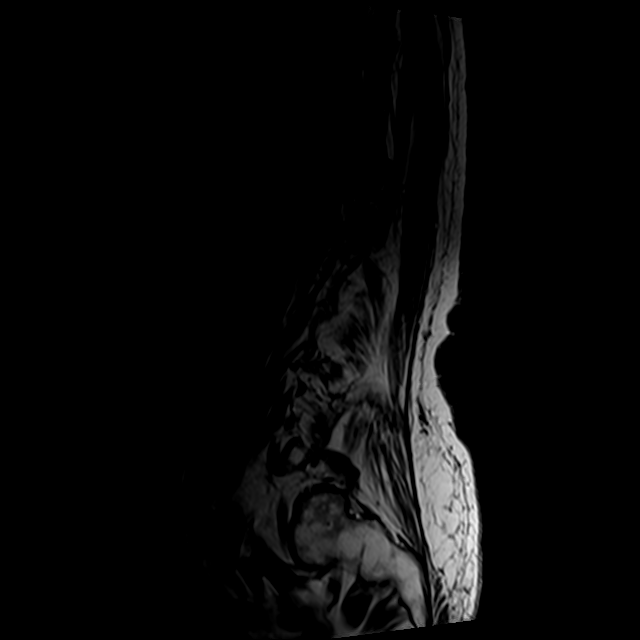

[Series 5: T2 · axial · 4.0mm · 0.78mm/px · z∈[-39,+183]mm · 8 of 38 slices shown (2 of 2)]
[im 1/38]
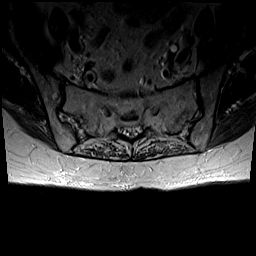
[im 6/38]
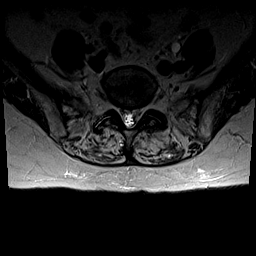
[im 12/38]
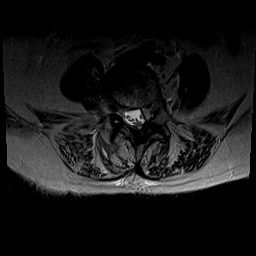
[im 18/38]
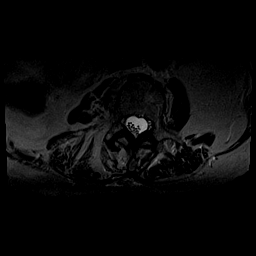
[im 20/38]
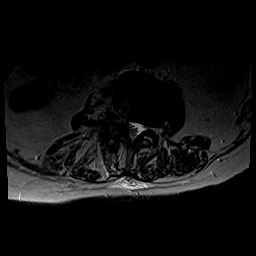
[im 26/38]
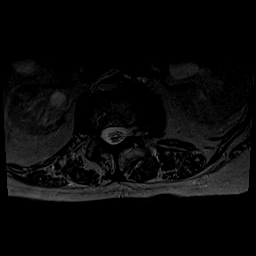
[im 32/38]
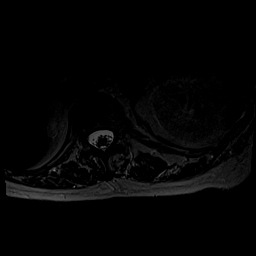
[im 38/38]
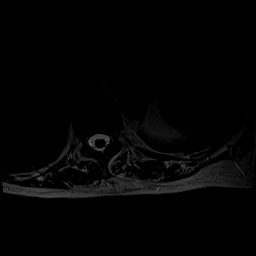

[Series 6: T1 · axial · 4.0mm · 0.31mm/px · z∈[-15,+153]mm · 3 of 38 slices shown (2 of 2)]
[im 6/38]
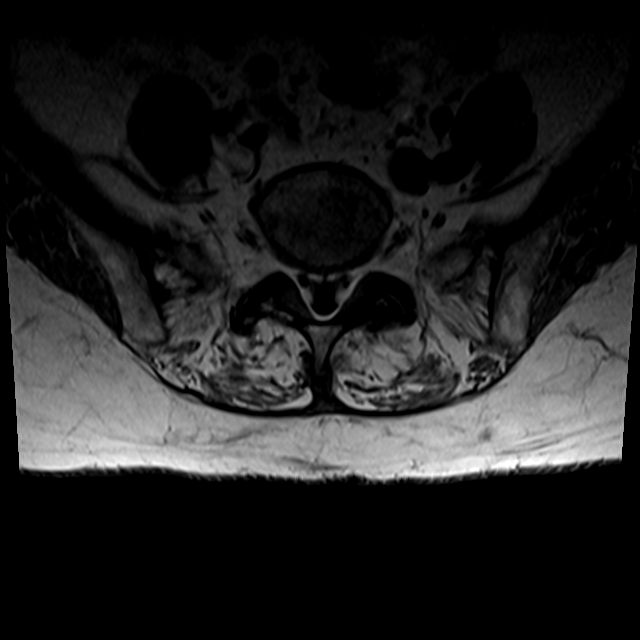
[im 20/38]
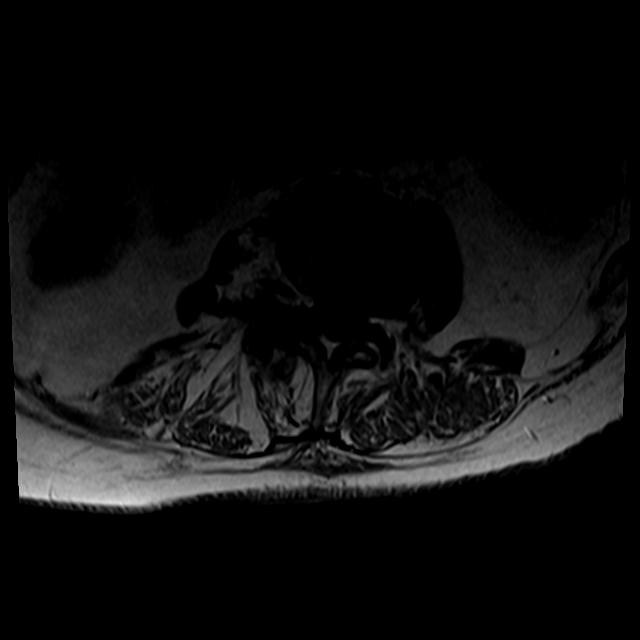
[im 32/38]
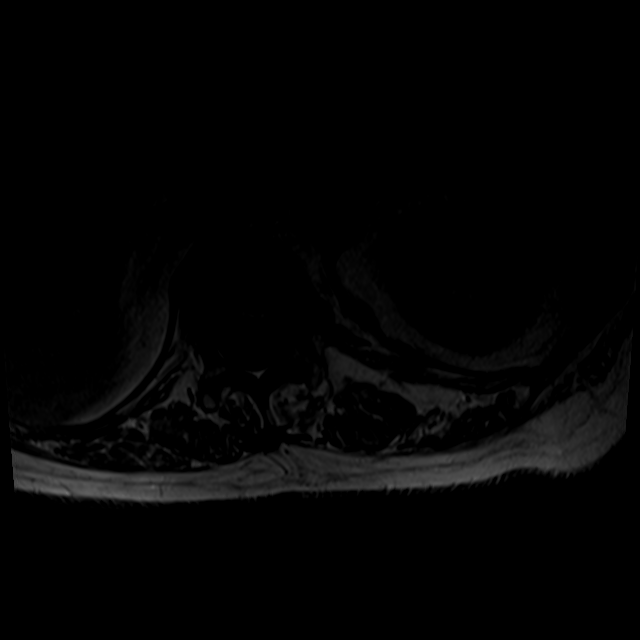

[23 of 48 positions shown; findings below may reference images not displayed]

FINDINGS: There is marked convex right thoracic and convex left lumbar
scoliosis. Vertebral body height is maintained. Trace retrolisthesis
L1 on L2 and anterolisthesis L4 on L5 is noted. Multilevel
degenerative endplate signal change is seen. The conus medullaris is
normal in signal and position. Imaged paraspinous structures
demonstrate atrophy of the right kidney. The left intrarenal
collecting system appears somewhat prominent.

T9-10 level is imaged in the sagittal plane only and negative.

T10-11:  Negative.

T11-12: Mild facet degenerative change and a minimal disc bulge
without central canal or foraminal narrowing.

T12-L1: Facet arthropathy is identified. There is a shallow disc
bulge to the left with endplate spur. The central canal and foramina
remain open.

L1-2: Shallow disc bulge and endplate spur result in some narrowing
in the right lateral recess without nerve root compression
identified. The foramina are open.

L2-3: Facet degenerative disease is worse on the right. There is a
shallow disc bulge to the right but the central canal and left
foramen are widely patent. Mild right foraminal narrowing is noted.

L3-4: Facet degenerative disease and a shallow disc bulge are seen.
The central canal and left foramen are open. Moderate to moderately
severe right foraminal narrowing is noted.

L4-5: Advanced bilateral facet degenerative disease is identified.
Slight disc uncovering and a shallow bulge are seen. Mild to
moderate central canal narrowing is identified. Narrowing is also
seen the left lateral recess which could irritate the descending
left L5 root. The right foramen is patent. Mild left foraminal
narrowing noted.

L5-S1: Facet degenerative disease and a mild disc bulge without
central canal or foraminal narrowing.
IMPRESSION: Marked thoracolumbar scoliosis convex to the right in the thoracic
spine and left in the lumbar spine.

And multilevel spondylosis most notable at L4-5 where there is
narrowing in the left lateral recess due to facet arthropathy and
disc which could impact the descending left L5 root. There is also
some left foraminal narrowing at this level. Please see above for
detailed description of individual levels.

Possible hydronephrosis on the left. Renal ultrasound could be used
for further evaluation. Atrophic right kidney noted.

## 2016-06-22 ENCOUNTER — Encounter: Payer: Medicare Other | Admitting: Surgery

## 2016-06-22 DIAGNOSIS — C44722 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, including hip: Secondary | ICD-10-CM | POA: Diagnosis not present

## 2016-06-23 NOTE — Progress Notes (Signed)
[name], [name] ([id number]) Visit Report for 06/22/2016 Arrival Information Details Patient Name: [name], [name] Date of Service: 06/22/2016 2:30 PM Medical Record Number: [id number] Patient Account Number: [id number] Date of Birth/Sex: 01/16/1929 (83 y.o. Female) Treating RN: Montey Hora Primary Care Ceazia Harb: Clayborn Bigness Other Clinician: Referring Mariaguadalupe Fialkowski: Clayborn Bigness Treating Latina Frank/Extender: Frann Rider in Treatment: 7 Visit Information History Since Last Visit Added or deleted any medications: No Patient Arrived: Ambulatory Any new allergies or adverse reactions: No Arrival Time: 14:41 Had a fall or experienced change in No Accompanied By: dtr activities of daily living that may affect Transfer Assistance: None risk of falls: Patient Identification Verified: Yes Signs or symptoms of abuse/neglect since last No Secondary Verification Process Yes visito Completed: Hospitalized since last visit: No Patient Requires Transmission-Based No Has Dressing in Place as Prescribed: Yes Precautions: Pain Present Now: Yes Patient Has Alerts: No Electronic Signature(s) Signed: 06/22/2016 5:04:44 PM By: Montey Hora Entered By: Montey Hora on 06/22/2016 14:41:27 Lybeck, [name] ([id number]) -------------------------------------------------------------------------------- Clinic Level of Care Assessment Details Patient Name: [name] Date of Service: 06/22/2016 2:30 PM Medical Record Number: [id number] Patient Account Number: [id number] Date of Birth/Sex: 1929/03/09 (83 y.o. Female) Treating RN: Montey Hora Primary Care Kadence Mikkelson: Clayborn Bigness Other Clinician: Referring Stacee Earp: Clayborn Bigness Treating Sho Salguero/Extender: Frann Rider in Treatment: 7 Clinic Level of Care Assessment Items TOOL 4 Quantity Score []  - Use when only an EandM is performed on FOLLOW-UP visit 0 ASSESSMENTS - Nursing Assessment / Reassessment X - Reassessment of  Co-morbidities (includes updates in patient status) 1 10 X - Reassessment of Adherence to Treatment Plan 1 5 ASSESSMENTS - Wound and Skin Assessment / Reassessment X - Simple Wound Assessment / Reassessment - one wound 1 5 []  - Complex Wound Assessment / Reassessment - multiple wounds 0 []  - Dermatologic / Skin Assessment (not related to wound area) 0 ASSESSMENTS - Focused Assessment []  - Circumferential Edema Measurements - multi extremities 0 []  - Nutritional Assessment / Counseling / Intervention 0 X - Lower Extremity Assessment (monofilament, tuning fork, pulses) 1 5 []  - Peripheral Arterial Disease Assessment (using hand held doppler) 0 ASSESSMENTS - Ostomy and/or Continence Assessment and Care []  - Incontinence Assessment and Management 0 []  - Ostomy Care Assessment and Management (repouching, etc.) 0 PROCESS - Coordination of Care X - Simple Patient / Family Education for ongoing care 1 15 []  - Complex (extensive) Patient / Family Education for ongoing care 0 []  - Staff obtains Programmer, systems, Records, Test Results / Process Orders 0 []  - Staff telephones HHA, Nursing Homes / Clarify orders / etc 0 []  - Routine Transfer to another Facility (non-emergent condition) 0 [name], [name] ([id number]) []  - Routine Hospital Admission (non-emergent condition) 0 []  - New Admissions / Biomedical engineer / Ordering NPWT, Apligraf, etc. 0 []  - Emergency Hospital Admission (emergent condition) 0 X - Simple Discharge Coordination 1 10 []  - Complex (extensive) Discharge Coordination 0 PROCESS - Special Needs []  - Pediatric / Minor Patient Management 0 []  - Isolation Patient Management 0 []  - Hearing / Language / Visual special needs 0 []  - Assessment of Community assistance (transportation, D/C planning, etc.) 0 []  - Additional assistance / Altered mentation 0 []  - Support Surface(s) Assessment (bed, cushion, seat, etc.) 0 INTERVENTIONS - Wound Cleansing / Measurement X - Simple Wound  Cleansing - one wound 1 5 []  - Complex Wound Cleansing - multiple wounds 0 X - Wound Imaging (photographs - any number of wounds) 1 5 []  - Wound Tracing (instead of photographs)  0 X - Simple Wound Measurement - one wound 1 5 []  - Complex Wound Measurement - multiple wounds 0 INTERVENTIONS - Wound Dressings []  - Small Wound Dressing one or multiple wounds 0 X - Medium Wound Dressing one or multiple wounds 1 15 []  - Large Wound Dressing one or multiple wounds 0 []  - Application of Medications - topical 0 []  - Application of Medications - injection 0 INTERVENTIONS - Miscellaneous []  - External ear exam 0 [name], [name] ([id number]) []  - Specimen Collection (cultures, biopsies, blood, body fluids, etc.) 0 []  - Specimen(s) / Culture(s) sent or taken to Lab for analysis 0 []  - Patient Transfer (multiple staff / Harrel Lemon Lift / Similar devices) 0 []  - Simple Staple / Suture removal (25 or less) 0 []  - Complex Staple / Suture removal (26 or more) 0 []  - Hypo / Hyperglycemic Management (close monitor of Blood Glucose) 0 []  - Ankle / Brachial Index (ABI) - do not check if billed separately 0 X - Vital Signs 1 5 Has the patient been seen at the hospital within the last three years: Yes Total Score: 85 Level Of Care: New/Established - Level 3 Electronic Signature(s) Signed: 06/22/2016 5:04:44 PM By: Montey Hora Entered By: Montey Hora on 06/22/2016 15:11:31 [name] ([id number]) -------------------------------------------------------------------------------- Encounter Discharge Information Details Patient Name: [name] Date of Service: 06/22/2016 2:30 PM Medical Record Number: [id number] Patient Account Number: [id number] Date of Birth/Sex: Sep 11, 1928 (83 y.o. Female) Treating RN: Montey Hora Primary Care Zhane Bluitt: Clayborn Bigness Other Clinician: Referring Cydnie Deason: Clayborn Bigness Treating Carlin Attridge/Extender: Frann Rider in Treatment: 7 Encounter Discharge  Information Items Discharge Pain Level: 0 Discharge Condition: Stable Ambulatory Status: Ambulatory Discharge Destination: Home Transportation: Private Auto Accompanied By: dtr Schedule Follow-up Appointment: Yes Medication Reconciliation completed and provided to Patient/Care No Kynan Peasley: Provided on Clinical Summary of Care: 06/22/2016 Form Type Recipient Paper Patient LP Electronic Signature(s) Signed: 06/22/2016 3:25:13 PM By: Ruthine Dose Entered By: Ruthine Dose on 06/22/2016 15:25:13 Higginson, [name] ([id number]) -------------------------------------------------------------------------------- Lower Extremity Assessment Details Patient Name: [name] Date of Service: 06/22/2016 2:30 PM Medical Record Number: [id number] Patient Account Number: [id number] Date of Birth/Sex: 06-May-1929 (83 y.o. Female) Treating RN: Montey Hora Primary Care Javia Dillow: Clayborn Bigness Other Clinician: Referring Creedon Danielski: Clayborn Bigness Treating Ledonna Dormer/Extender: Frann Rider in Treatment: 7 Edema Assessment Assessed: [Left: No] [Right: No] Edema: [Left: Ye] [Right: s] Vascular Assessment Pulses: Dorsalis Pedis Palpable: [Right:Yes] Posterior Tibial Extremity colors, hair growth, and conditions: Extremity Color: [Right:Mottled] Hair Growth on Extremity: [Right:No] Temperature of Extremity: [Right:Warm] Capillary Refill: [Right:< 3 seconds] Electronic Signature(s) Signed: 06/22/2016 5:04:44 PM By: Montey Hora Entered By: Montey Hora on 06/22/2016 14:57:48 Macke, [name] ([id number]) -------------------------------------------------------------------------------- Multi Wound Chart Details Patient Name: [name] Date of Service: 06/22/2016 2:30 PM Medical Record Number: [id number] Patient Account Number: [id number] Date of Birth/Sex: 14-Jun-1928 (84 y.o. Female) Treating RN: Montey Hora Primary Care Starsky Nanna: Clayborn Bigness Other Clinician: Referring  Shadoe Cryan: Clayborn Bigness Treating Mukund Weinreb/Extender: Frann Rider in Treatment: 7 Vital Signs Height(in): 60 Pulse(bpm): 74 Weight(lbs): 122 Blood Pressure 153/56 (mmHg): Body Mass Index(BMI): 24 Temperature(F): 97.8 Respiratory Rate 18 (breaths/min): Photos: [N/A:N/A] Wound Location: Right Lower Leg - Anterior N/A N/A Wounding Event: Trauma N/A N/A Primary Etiology: Trauma, Other N/A N/A Comorbid History: Asthma, Hypertension N/A N/A Date Acquired: 04/21/2016 N/A N/A Weeks of Treatment: 5 N/A N/A Wound Status: Open N/A N/A Measurements L x W x D 10.8x1.1x1.2 N/A N/A (cm) Area (cm) : 9.331 N/A N/A Volume (cm) : 11.197 N/A N/A % Reduction  in Area: 67.70% N/A N/A % Reduction in Volume: 77.20% N/A N/A Classification: Full Thickness Without N/A N/A Exposed Support Structures Exudate Amount: Large N/A N/A Exudate Type: Serous N/A N/A Exudate Color: amber N/A N/A Wound Margin: Flat and Intact N/A N/A Granulation Amount: Medium (34-66%) N/A N/A Granulation Quality: Red N/A N/A Necrotic Amount: Medium (34-66%) N/A N/A [name], [name] ([id number]) Necrotic Tissue: Eschar, Adherent Slough N/A N/A Exposed Structures: Fascia: No N/A N/A Fat Layer (Subcutaneous Tissue) Exposed: No Tendon: No Muscle: No Joint: No Bone: No Limited to Skin Breakdown Epithelialization: None N/A N/A Periwound Skin Texture: Excoriation: No N/A N/A Induration: No Callus: No Crepitus: No Rash: No Scarring: No Periwound Skin Maceration: No N/A N/A Moisture: Dry/Scaly: No Periwound Skin Color: Erythema: Yes N/A N/A Atrophie Blanche: No Cyanosis: No Ecchymosis: No Hemosiderin Staining: No Mottled: No Pallor: No Rubor: No Erythema Location: Circumferential N/A N/A Tenderness on Yes N/A N/A Palpation: Wound Preparation: Ulcer Cleansing: N/A N/A Rinsed/Irrigated with Saline Topical Anesthetic Applied: Other: lidocaine 4% Treatment Notes Electronic Signature(s) Signed:  06/22/2016 3:10:35 PM By: Christin Fudge MD, FACS Previous Signature: 06/22/2016 3:09:50 PM Version By: Christin Fudge MD, FACS Entered By: Christin Fudge on 06/22/2016 15:10:35 [name], [name] ([id number]) -------------------------------------------------------------------------------- [place] Details Patient Name: [name] Date of Service: 06/22/2016 2:30 PM Medical Record Number: [id number] Patient Account Number: [id number] Date of Birth/Sex: 09-03-1928 (83 y.o. Female) Treating RN: Montey Hora Primary Care Ruffin Lada: Clayborn Bigness Other Clinician: Referring Diamante Truszkowski: Clayborn Bigness Treating Jazminn Pomales/Extender: Frann Rider in Treatment: 7 Active Inactive ` Abuse / Safety / Falls / Self Care Management Nursing Diagnoses: Impaired physical mobility Potential for falls Goals: Patient will remain injury free Date Initiated: 05/04/2016 Target Resolution Date: 07/25/2016 Goal Status: Active Interventions: Assess fall risk on admission and as needed Notes: ` Orientation to the Wound Care Program Nursing Diagnoses: Knowledge deficit related to the wound healing center program Goals: Patient/caregiver will verbalize understanding of the [place] Program Date Initiated: 05/04/2016 Target Resolution Date: 07/25/2016 Goal Status: Active Interventions: Provide education on orientation to the wound center Notes: ` Pain, Acute or Chronic Nursing Diagnoses: Pain, acute or chronic: actual or potential [name], [name] ([id number]) Goals: Patient/caregiver will verbalize adequate pain control between visits Date Initiated: 05/04/2016 Target Resolution Date: 07/25/2016 Goal Status: Active Interventions: Complete pain assessment as per visit requirements Notes: ` Wound/Skin Impairment Nursing Diagnoses: Impaired tissue integrity Goals: Patient/caregiver will verbalize understanding of skin care regimen Date Initiated: 05/04/2016 Target  Resolution Date: 07/25/2016 Goal Status: Active Ulcer/skin breakdown will have a volume reduction of 30% by week 4 Date Initiated: 05/04/2016 Target Resolution Date: 07/25/2016 Goal Status: Active Ulcer/skin breakdown will have a volume reduction of 50% by week 8 Date Initiated: 05/04/2016 Target Resolution Date: 08/22/2016 Goal Status: Active Ulcer/skin breakdown will have a volume reduction of 80% by week 12 Date Initiated: 05/04/2016 Target Resolution Date: 08/22/2016 Goal Status: Active Ulcer/skin breakdown will heal within 14 weeks Date Initiated: 05/04/2016 Target Resolution Date: 09/05/2016 Goal Status: Active Interventions: Assess patient/caregiver ability to obtain necessary supplies Assess patient/caregiver ability to perform ulcer/skin care regimen upon admission and as needed Assess ulceration(s) every visit Notes: Electronic Signature(s) Signed: 06/22/2016 5:04:44 PM By: Montey Hora Entered By: Montey Hora on 06/22/2016 14:58:33 [name], [name] ([id number]) -------------------------------------------------------------------------------- Pain Assessment Details Patient Name: [name] Date of Service: 06/22/2016 2:30 PM Medical Record Number: [id number] Patient Account Number: [id number] Date of Birth/Sex: 19-Jul-1928 (83 y.o. Female) Treating RN: Montey Hora Primary Care Caelan Branden: Clayborn Bigness Other Clinician: Referring Sheriann Newmann: Humphrey Rolls,  [place] Treating Somtochukwu Woollard/Extender: Frann Rider in Treatment: 7 Active Problems Location of Pain Severity and Description of Pain Patient Has Paino Yes Site Locations Pain Location: Pain in Ulcers With Dressing Change: Yes Duration of the Pain. Constant / Intermittento Intermittent Pain Management and Medication Current Pain Management: Notes Topical or injectable lidocaine is offered to patient for acute pain when surgical debridement is performed. If needed, Patient is instructed to use over the counter pain  medication for the following 24-48 hours after debridement. Wound care MDs do not prescribed pain medications. Patient has chronic pain or uncontrolled pain. Patient has been instructed to make an appointment with their Primary Care Physician for pain management. Electronic Signature(s) Signed: 06/22/2016 5:04:44 PM By: Montey Hora Entered By: Montey Hora on 06/22/2016 14:42:03 [name] ([id number]) -------------------------------------------------------------------------------- Patient/Caregiver Education Details Patient Name: [name] Date of Service: 06/22/2016 2:30 PM Medical Record Number: [id number] Patient Account Number: [id number] Date of Birth/Gender: 09-Feb-1929 (83 y.o. Female) Treating RN: Montey Hora Primary Care Physician: Clayborn Bigness Other Clinician: Referring Physician: Clayborn Bigness Treating Physician/Extender: Frann Rider in Treatment: 7 Education Assessment Education Provided To: Patient and Caregiver Education Topics Provided Wound/Skin Impairment: Handouts: Other: wound care as ordered Methods: Demonstration, Explain/Verbal Responses: State content correctly Electronic Signature(s) Signed: 06/22/2016 5:04:44 PM By: Montey Hora Entered By: Montey Hora on 06/22/2016 15:06:51 [name] ([id number]) -------------------------------------------------------------------------------- Wound Assessment Details Patient Name: [name] Date of Service: 06/22/2016 2:30 PM Medical Record Number: [id number] Patient Account Number: [id number] Date of Birth/Sex: 27-May-1929 (83 y.o. Female) Treating RN: Montey Hora Primary Care Charese Abundis: Clayborn Bigness Other Clinician: Referring Ladarian Bonczek: Clayborn Bigness Treating Dasean Brow/Extender: Frann Rider in Treatment: 7 Wound Status Wound Number: 4 Primary Etiology: Trauma, Other Wound Location: Right Lower Leg - Anterior Wound Status: Open Wounding Event: Trauma Comorbid  History: Asthma, Hypertension Date Acquired: 04/21/2016 Weeks Of Treatment: 5 Clustered Wound: No Photos Wound Measurements Length: (cm) 10.8 Width: (cm) 1.1 Depth: (cm) 1.2 Area: (cm) 9.331 Volume: (cm) 11.197 % Reduction in Area: 67.7% % Reduction in Volume: 77.2% Epithelialization: None Tunneling: No Undermining: No Wound Description Full Thickness Without Exposed Foul Odor Aft Classification: Support Structures Wound Margin: Flat and Intact Exudate Large Amount: Exudate Type: Serous Exudate Color: amber er Cleansing: No Wound Bed Granulation Amount: Medium (34-66%) Exposed Structure Granulation Quality: Red Fascia Exposed: No Necrotic Amount: Medium (34-66%) Fat Layer (Subcutaneous Tissue) Exposed: No [name], [name] ([id number]) Necrotic Quality: Eschar, Adherent Slough Tendon Exposed: No Muscle Exposed: No Joint Exposed: No Bone Exposed: No Limited to Skin Breakdown Periwound Skin Texture Texture Color No Abnormalities Noted: No No Abnormalities Noted: No Callus: No Atrophie Blanche: No Crepitus: No Cyanosis: No Excoriation: No Ecchymosis: No Induration: No Erythema: Yes Rash: No Erythema Location: Circumferential Scarring: No Hemosiderin Staining: No Mottled: No Moisture Pallor: No No Abnormalities Noted: No Rubor: No Dry / Scaly: No Maceration: No Temperature / Pain Tenderness on Palpation: Yes Wound Preparation Ulcer Cleansing: Rinsed/Irrigated with Saline Topical Anesthetic Applied: Other: lidocaine 4%, Treatment Notes Wound #4 (Right, Anterior Lower Leg) 1. Cleansed with: Clean wound with Normal Saline 2. Anesthetic Topical Lidocaine 4% cream to wound bed prior to debridement 4. Dressing Applied: Aquacel Ag 5. Secondary Dressing Applied ABD Pad Kerlix/Conform Non-Adherent pad 7. Secured with Recruitment consultant) Signed: 06/22/2016 5:04:44 PM By: Montey Hora Entered By: Montey Hora on 06/22/2016  14:56:06 [name] ([id number]) -------------------------------------------------------------------------------- [place] Details Patient Name: [name] Date of Service: 06/22/2016 2:30 PM Medical Record Number: [id number] Patient Account Number: [id number] Date of Birth/Sex:  06-Dec-1928 (83 y.o. Female) Treating RN: Montey Hora Primary Care Ryelle Ruvalcaba: Clayborn Bigness Other Clinician: Referring Pasco Marchitto: Clayborn Bigness Treating Sharin Altidor/Extender: Frann Rider in Treatment: 7 Vital Signs Time Taken: 14:42 Temperature (F): 97.8 Height (in): 60 Pulse (bpm): 74 Weight (lbs): 122 Respiratory Rate (breaths/min): 18 Body Mass Index (BMI): 23.8 Blood Pressure (mmHg): 153/56 Reference Range: 80 - 120 mg / dl Electronic Signature(s) Signed: 06/22/2016 5:04:44 PM By: Montey Hora Entered By: Montey Hora on 06/22/2016 14:44:05
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
## 2016-06-30 ENCOUNTER — Encounter: Payer: Medicare Other | Attending: Surgery | Admitting: Surgery

## 2016-06-30 DIAGNOSIS — J45909 Unspecified asthma, uncomplicated: Secondary | ICD-10-CM | POA: Insufficient documentation

## 2016-06-30 DIAGNOSIS — S81811A Laceration without foreign body, right lower leg, initial encounter: Secondary | ICD-10-CM | POA: Diagnosis not present

## 2016-06-30 DIAGNOSIS — C44722 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, including hip: Secondary | ICD-10-CM | POA: Insufficient documentation

## 2016-06-30 DIAGNOSIS — F419 Anxiety disorder, unspecified: Secondary | ICD-10-CM | POA: Diagnosis not present

## 2016-06-30 DIAGNOSIS — X58XXXA Exposure to other specified factors, initial encounter: Secondary | ICD-10-CM | POA: Diagnosis not present

## 2016-06-30 DIAGNOSIS — I351 Nonrheumatic aortic (valve) insufficiency: Secondary | ICD-10-CM | POA: Insufficient documentation

## 2016-06-30 DIAGNOSIS — R011 Cardiac murmur, unspecified: Secondary | ICD-10-CM | POA: Insufficient documentation

## 2016-06-30 DIAGNOSIS — L97312 Non-pressure chronic ulcer of right ankle with fat layer exposed: Secondary | ICD-10-CM | POA: Insufficient documentation

## 2016-06-30 DIAGNOSIS — I10 Essential (primary) hypertension: Secondary | ICD-10-CM | POA: Insufficient documentation

## 2016-06-30 DIAGNOSIS — C44729 Squamous cell carcinoma of skin of left lower limb, including hip: Secondary | ICD-10-CM | POA: Insufficient documentation

## 2016-06-30 DIAGNOSIS — I739 Peripheral vascular disease, unspecified: Secondary | ICD-10-CM | POA: Insufficient documentation

## 2016-07-01 NOTE — Progress Notes (Signed)
[name], [name] ([id number]) Visit Report for 06/30/2016 Arrival Information Details Patient Name: [name], [name] Date of Service: 06/30/2016 1:30 PM Medical Record Number: [id number] Patient Account Number: [id number] Date of Birth/Sex: December 15, 1928 (83 y.o. Female) Treating RN: Montey Hora Primary Care Gerldine Suleiman: Clayborn Bigness Other Clinician: Referring Katrianna Friesenhahn: Clayborn Bigness Treating Yordin Rhoda/Extender: Frann Rider in Treatment: 8 Visit Information History Since Last Visit Added or deleted any medications: No Patient Arrived: Ambulatory Any new allergies or adverse reactions: No Arrival Time: 13:47 Had a fall or experienced change in No Accompanied By: dtr activities of daily living that may affect Transfer Assistance: None risk of falls: Patient Identification Verified: Yes Signs or symptoms of abuse/neglect since last No Secondary Verification Process Yes visito Completed: Hospitalized since last visit: No Patient Requires Transmission-Based No Has Dressing in Place as Prescribed: Yes Precautions: Pain Present Now: Yes Patient Has Alerts: No Electronic Signature(s) Signed: 06/30/2016 4:48:37 PM By: Montey Hora Entered By: Montey Hora on 06/30/2016 13:48:08 [name] ([id number]) -------------------------------------------------------------------------------- Encounter Discharge Information Details Patient Name: [name] Date of Service: 06/30/2016 1:30 PM Medical Record Number: [id number] Patient Account Number: [id number] Date of Birth/Sex: 25-May-1929 (83 y.o. Female) Treating RN: Montey Hora Primary Care Kore Madlock: Clayborn Bigness Other Clinician: Referring Yisroel Mullendore: Clayborn Bigness Treating Cassie Shedlock/Extender: Frann Rider in Treatment: 8 Encounter Discharge Information Items Discharge Pain Level: 0 Discharge Condition: Stable Ambulatory Status: Ambulatory Discharge Destination: Home Transportation: Private Auto Accompanied By:  dtr Schedule Follow-up Appointment: Yes Medication Reconciliation completed and provided to Patient/Care No Rejoice Heatwole: Provided on Clinical Summary of Care: 06/30/2016 Form Type Recipient Paper Patient LP Electronic Signature(s) Signed: 06/30/2016 2:20:20 PM By: Ruthine Dose Entered By: Ruthine Dose on 06/30/2016 14:20:20 [name] ([id number]) -------------------------------------------------------------------------------- Lower Extremity Assessment Details Patient Name: [name] Date of Service: 06/30/2016 1:30 PM Medical Record Number: [id number] Patient Account Number: [id number] Date of Birth/Sex: [birth year] (83 y.o. Female) Treating RN: Montey Hora Primary Care Starlette Thurow: Clayborn Bigness Other Clinician: Referring Khadeeja Elden: Clayborn Bigness Treating Mylei Brackeen/Extender: Frann Rider in Treatment: 8 Vascular Assessment Pulses: Dorsalis Pedis Palpable: [Right:Yes] Posterior Tibial Extremity colors, hair growth, and conditions: Extremity Color: [Right:Hyperpigmented] Hair Growth on Extremity: [Right:No] Temperature of Extremity: [Right:Warm] Capillary Refill: [Right:< 3 seconds] Electronic Signature(s) Signed: 06/30/2016 4:48:37 PM By: Montey Hora Entered By: Montey Hora on 06/30/2016 13:58:23 [name] ([id number]) -------------------------------------------------------------------------------- Multi Wound Chart Details Patient Name: [name] Date of Service: 06/30/2016 1:30 PM Medical Record Number: [id number] Patient Account Number: [id number] Date of Birth/Sex: 11/14/1928 (83 y.o. Female) Treating RN: Montey Hora Primary Care Kourtney Montesinos: Clayborn Bigness Other Clinician: Referring Davian Wollenberg: Clayborn Bigness Treating Tzirel Leonor/Extender: Frann Rider in Treatment: 8 Vital Signs Height(in): 60 Pulse(bpm): 78 Weight(lbs): 122 Blood Pressure 153/54 (mmHg): Body Mass Index(BMI): 24 Temperature(F): Respiratory  Rate 18 (breaths/min): Photos: [N/A:N/A] Wound Location: Right Lower Leg - Anterior N/A N/A Wounding Event: Trauma N/A N/A Primary Etiology: Trauma, Other N/A N/A Comorbid History: Asthma, Hypertension N/A N/A Date Acquired: 04/21/2016 N/A N/A Weeks of Treatment: 6 N/A N/A Wound Status: Open N/A N/A Measurements L x W x D 10.8x1x1.5 N/A N/A (cm) Area (cm) : 8.482 N/A N/A Volume (cm) : 12.723 N/A N/A % Reduction in Area: 70.70% N/A N/A % Reduction in Volume: 74.10% N/A N/A Classification: Full Thickness Without N/A N/A Exposed Support Structures Exudate Amount: Large N/A N/A Exudate Type: Serous N/A N/A Exudate Color: amber N/A N/A Wound Margin: Flat and Intact N/A N/A Granulation Amount: Medium (34-66%) N/A N/A Granulation Quality: Red N/A N/A Necrotic Amount: Medium (34-66%) N/A N/A [name] (  [id number]) Necrotic Tissue: Eschar, Adherent Slough N/A N/A Exposed Structures: Fascia: No N/A N/A Fat Layer (Subcutaneous Tissue) Exposed: No Tendon: No Muscle: No Joint: No Bone: No Limited to Skin Breakdown Epithelialization: None N/A N/A Debridement: Debridement [id number] N/A N/A [id number]) Pre-procedure 14:04 N/A N/A Verification/Time Out Taken: Pain Control: Lidocaine 4% Topical N/A N/A Solution Tissue Debrided: Fibrin/Slough, N/A N/A Subcutaneous Level: Skin/Subcutaneous N/A N/A Tissue Debridement Area (sq 10.8 N/A N/A cm): Instrument: Curette N/A N/A Bleeding: Minimum N/A N/A Hemostasis Achieved: Pressure N/A N/A Procedural Pain: 0 N/A N/A Post Procedural Pain: 0 N/A N/A Debridement Treatment Procedure was tolerated N/A N/A Response: well Post Debridement 10.8x1x1.6 N/A N/A Measurements L x W x D (cm) Post Debridement 13.572 N/A N/A Volume: (cm) Periwound Skin Texture: Excoriation: No N/A N/A Induration: No Callus: No Crepitus: No Rash: No Scarring: No Periwound Skin Maceration: No N/A N/A Moisture: Dry/Scaly: No Periwound Skin Color:  Erythema: Yes N/A N/A Atrophie Blanche: No Cyanosis: No Ecchymosis: No Hemosiderin Staining: No Mottled: No [name] ([id number]) Pallor: No Rubor: No Erythema Location: Circumferential N/A N/A Tenderness on Yes N/A N/A Palpation: Wound Preparation: Ulcer Cleansing: N/A N/A Rinsed/Irrigated with Saline Topical Anesthetic Applied: Other: lidocaine 4% Procedures Performed: Debridement N/A N/A Treatment Notes Wound #4 (Right, Anterior Lower Leg) 1. Cleansed with: Clean wound with Normal Saline 2. Anesthetic Topical Lidocaine 4% cream to wound bed prior to debridement 4. Dressing Applied: Aquacel Ag 5. Secondary Dressing Applied Non-Adherent pad Guaze, ABD and kerlix/Conform 7. Secured with Recruitment consultant) Signed: 06/30/2016 2:18:49 PM By: Christin Fudge MD, FACS Entered By: Christin Fudge on 06/30/2016 14:18:49 ADASSA, NEIDHART ([id number]) -------------------------------------------------------------------------------- [place] Details Patient Name: [name] Date of Service: 06/30/2016 1:30 PM Medical Record Number: [id number] Patient Account Number: [id number] Date of Birth/Sex: 15-Jul-1928 (83 y.o. Female) Treating RN: Montey Hora Primary Care Nevyn Bossman: Clayborn Bigness Other Clinician: Referring Emmalynne Courtney: Clayborn Bigness Treating Marija Calamari/Extender: Frann Rider in Treatment: 8 Active Inactive ` Abuse / Safety / Falls / Self Care Management Nursing Diagnoses: Impaired physical mobility Potential for falls Goals: Patient will remain injury free Date Initiated: 05/04/2016 Target Resolution Date: 07/25/2016 Goal Status: Active Interventions: Assess fall risk on admission and as needed Notes: ` Orientation to the Wound Care Program Nursing Diagnoses: Knowledge deficit related to the wound healing center program Goals: Patient/caregiver will verbalize understanding of the [place] Date  Initiated: 05/04/2016 Target Resolution Date: 07/25/2016 Goal Status: Active Interventions: Provide education on orientation to the wound center Notes: ` Pain, Acute or Chronic Nursing Diagnoses: Pain, acute or chronic: actual or potential SIEANNA, FATTA ([id number]) Goals: Patient/caregiver will verbalize adequate pain control between visits Date Initiated: 05/04/2016 Target Resolution Date: 07/25/2016 Goal Status: Active Interventions: Complete pain assessment as per visit requirements Notes: ` Wound/Skin Impairment Nursing Diagnoses: Impaired tissue integrity Goals: Patient/caregiver will verbalize understanding of skin care regimen Date Initiated: 05/04/2016 Target Resolution Date: 07/25/2016 Goal Status: Active Ulcer/skin breakdown will have a volume reduction of 30% by week 4 Date Initiated: 05/04/2016 Target Resolution Date: 07/25/2016 Goal Status: Active Ulcer/skin breakdown will have a volume reduction of 50% by week 8 Date Initiated: 05/04/2016 Target Resolution Date: 08/22/2016 Goal Status: Active Ulcer/skin breakdown will have a volume reduction of 80% by week 12 Date Initiated: 05/04/2016 Target Resolution Date: 08/22/2016 Goal Status: Active Ulcer/skin breakdown will heal within 14 weeks Date Initiated: 05/04/2016 Target Resolution Date: 09/05/2016 Goal Status: Active Interventions: Assess patient/caregiver ability to obtain necessary supplies Assess patient/caregiver ability to perform ulcer/skin care regimen upon  admission and as needed Assess ulceration(s) every visit Notes: Electronic Signature(s) Signed: 06/30/2016 4:48:37 PM By: Montey Hora Entered By: Montey Hora on 06/30/2016 13:58:55 Bilotti, Heba ([id number]) -------------------------------------------------------------------------------- Pain Assessment Details Patient Name: [name] Date of Service: 06/30/2016 1:30 PM Medical Record Number: [id number] Patient Account Number:  [id number] Date of Birth/Sex: 05/13/1929 (83 y.o. Female) Treating RN: Montey Hora Primary Care Kutler Vanvranken: Clayborn Bigness Other Clinician: Referring Shaun Zuccaro: Clayborn Bigness Treating Serge Main/Extender: Frann Rider in Treatment: 8 Active Problems Location of Pain Severity and Description of Pain Patient Has Paino Yes Site Locations Pain Location: Generalized Pain, Pain in Ulcers With Dressing Change: Yes Duration of the Pain. Constant / Intermittento Constant Pain Management and Medication Current Pain Management: Notes Topical or injectable lidocaine is offered to patient for acute pain when surgical debridement is performed. If needed, Patient is instructed to use over the counter pain medication for the following 24-48 hours after debridement. Wound care MDs do not prescribed pain medications. Patient has chronic pain or uncontrolled pain. Patient has been instructed to make an appointment with their Primary Care Physician for pain management. Electronic Signature(s) Signed: 06/30/2016 4:48:37 PM By: Montey Hora Entered By: Montey Hora on 06/30/2016 13:48:23 [name] ([id number]) -------------------------------------------------------------------------------- Patient/Caregiver Education Details Patient Name: [name] Date of Service: 06/30/2016 1:30 PM Medical Record Number: [id number] Patient Account Number: [id number] Date of Birth/Gender: [birth year] (83 y.o. Female) Treating RN: Montey Hora Primary Care Physician: Clayborn Bigness Other Clinician: Referring Physician: Clayborn Bigness Treating Physician/Extender: Frann Rider in Treatment: 8 Education Assessment Education Provided To: Patient and Caregiver Education Topics Provided Wound/Skin Impairment: Handouts: Other: wound care as ordered Methods: Demonstration, Explain/Verbal Responses: State content correctly Electronic Signature(s) Signed: 06/30/2016 4:48:37 PM By: Montey Hora Entered By: Montey Hora on 06/30/2016 14:06:10 [name] ([id number]) -------------------------------------------------------------------------------- Wound Assessment Details Patient Name: [name] Date of Service: 06/30/2016 1:30 PM Medical Record Number: [id number] Patient Account Number: [id number] Date of Birth/Sex: 1928-12-04 (83 y.o. Female) Treating RN: Montey Hora Primary Care Deron Poole: Clayborn Bigness Other Clinician: Referring Perlie Stene: Clayborn Bigness Treating [name] Gloeckner/Extender: Frann Rider in Treatment: 8 Wound Status Wound Number: 4 Primary Etiology: Trauma, Other Wound Location: Right Lower Leg - Anterior Wound Status: Open Wounding Event: Trauma Comorbid History: Asthma, Hypertension Date Acquired: 04/21/2016 Weeks Of Treatment: 6 Clustered Wound: No Photos Wound Measurements Length: (cm) 10.8 Width: (cm) 1 Depth: (cm) 1.5 Area: (cm) 8.482 Volume: (cm) 12.723 % Reduction in Area: 70.7% % Reduction in Volume: 74.1% Epithelialization: None Tunneling: No Undermining: No Wound Description Full Thickness Without Exposed Foul Odor Aft Classification: Support Structures Wound Margin: Flat and Intact Exudate Large Amount: Exudate Type: Serous Exudate Color: amber er Cleansing: No Wound Bed Granulation Amount: Medium (34-66%) Exposed Structure Granulation Quality: Red Fascia Exposed: No Necrotic Amount: Medium (34-66%) Fat Layer (Subcutaneous Tissue) Exposed: No Westerhoff, Malala ([id number]) Necrotic Quality: Eschar, Adherent Slough Tendon Exposed: No Muscle Exposed: No Joint Exposed: No Bone Exposed: No Limited to Skin Breakdown Periwound Skin Texture Texture Color No Abnormalities Noted: No No Abnormalities Noted: No Callus: No Atrophie Blanche: No Crepitus: No Cyanosis: No Excoriation: No Ecchymosis: No Induration: No Erythema: Yes Rash: No Erythema Location: Circumferential Scarring: No Hemosiderin  Staining: No Mottled: No Moisture Pallor: No No Abnormalities Noted: No Rubor: No Dry / Scaly: No Maceration: No Temperature / Pain Tenderness on Palpation: Yes Wound Preparation Ulcer Cleansing: Rinsed/Irrigated with Saline Topical Anesthetic Applied: Other: lidocaine 4%, Treatment Notes Wound #4 (Right, Anterior Lower Leg) 1. Cleansed with: Clean wound with Normal Saline  2. Anesthetic Topical Lidocaine 4% cream to wound bed prior to debridement 4. Dressing Applied: Aquacel Ag 5. Secondary Dressing Applied Non-Adherent pad Guaze, ABD and kerlix/Conform 7. Secured with Recruitment consultant) Signed: 06/30/2016 4:48:37 PM By: Montey Hora Entered By: Montey Hora on 06/30/2016 13:59:25 Deetz, Edwena [name] ([id number]) -------------------------------------------------------------------------------- [place] Details Patient Name: [name] Date of Service: 06/30/2016 1:30 PM Medical Record Number: [id number] Patient Account Number: [id number] Date of Birth/Sex: 02-28-1929 (83 y.o. Female) Treating RN: Montey Hora Primary Care Stclair Szymborski: Clayborn Bigness Other Clinician: Referring Shaton Lore: Clayborn Bigness Treating Lajarvis Italiano/Extender: Frann Rider in Treatment: 8 Vital Signs Time Taken: 13:48 Pulse (bpm): 78 Height (in): 60 Respiratory Rate (breaths/min): 18 Weight (lbs): 122 Blood Pressure (mmHg): 153/54 Body Mass Index (BMI): 23.8 Reference Range: 80 - 120 mg / dl Electronic Signature(s) Signed: 06/30/2016 4:48:37 PM By: Montey Hora Entered By: Montey Hora on 06/30/2016 13:49:28
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
## 2016-07-06 ENCOUNTER — Encounter: Payer: Medicare Other | Admitting: Surgery

## 2016-07-06 DIAGNOSIS — C44722 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, including hip: Secondary | ICD-10-CM | POA: Diagnosis not present

## 2016-07-07 NOTE — Progress Notes (Signed)
[name], [name] ([id number]) Visit Report for 07/06/2016 Arrival Information Details Patient Name: [name], [name] Date of Service: 07/06/2016 2:30 PM Medical Record Number: [id number] Patient Account Number: [id number] Date of Birth/Sex: [birth year] (83 y.o. Female) Treating RN: Montey Hora Primary Care Vickey Ewbank: Clayborn Bigness Other Clinician: Referring Jamyra Zweig: Clayborn Bigness Treating Alvis Pulcini/Extender: Frann Rider in Treatment: 9 Visit Information History Since Last Visit Added or deleted any medications: No Patient Arrived: Ambulatory Any new allergies or adverse reactions: No Arrival Time: 14:47 Had a fall or experienced change in No Accompanied By: dtr activities of daily living that may affect Transfer Assistance: None risk of falls: Patient Identification Verified: Yes Signs or symptoms of abuse/neglect since last No Secondary Verification Process Yes visito Completed: Hospitalized since last visit: No Patient Requires Transmission-Based No Has Dressing in Place as Prescribed: Yes Precautions: Pain Present Now: Yes Patient Has Alerts: No Electronic Signature(s) Signed: 07/06/2016 5:25:14 PM By: Montey Hora Entered By: Montey Hora on 07/06/2016 14:47:54 [name], [name] ([id number]) -------------------------------------------------------------------------------- Encounter Discharge Information Details Patient Name: [name] Date of Service: 07/06/2016 2:30 PM Medical Record Number: [id number] Patient Account Number: [id number] Date of Birth/Sex: Feb 27, 1929 (83 y.o. Female) Treating RN: Montey Hora Primary Care Gracen Southwell: Clayborn Bigness Other Clinician: Referring Arelys Glassco: Clayborn Bigness Treating Alvin Diffee/Extender: Frann Rider in Treatment: 9 Encounter Discharge Information Items Discharge Pain Level: 0 Discharge Condition: Stable Ambulatory Status: Ambulatory Discharge Destination: Home Transportation: Private Auto Accompanied By:  dtr Schedule Follow-up Appointment: Yes Medication Reconciliation completed and provided to Patient/Care No Alayjah Boehringer: Provided on Clinical Summary of Care: 07/06/2016 Form Type Recipient Paper Patient [employer] Electronic Signature(s) Signed: 07/06/2016 3:35:47 PM By: Ruthine Dose Entered By: Ruthine Dose on 07/06/2016 15:35:46 [name], [name] ([id number]) -------------------------------------------------------------------------------- Lower Extremity Assessment Details Patient Name: [name] Date of Service: 07/06/2016 2:30 PM Medical Record Number: [id number] Patient Account Number: [id number] Date of Birth/Sex: 12/12/1928 (83 y.o. Female) Treating RN: Montey Hora Primary Care Shulamit Donofrio: Clayborn Bigness Other Clinician: Referring Kaytlynn Kochan: Clayborn Bigness Treating Jaydeen Darley/Extender: Frann Rider in Treatment: 9 Vascular Assessment Pulses: Dorsalis Pedis Palpable: [Right:Yes] Posterior Tibial Extremity colors, hair growth, and conditions: Extremity Color: [Right:Mottled] Hair Growth on Extremity: [Right:No] Temperature of Extremity: [Right:Warm] Capillary Refill: [Right:< 3 seconds] Electronic Signature(s) Signed: 07/06/2016 5:25:14 PM By: Montey Hora Entered By: Montey Hora on 07/06/2016 15:17:22 [name], [name] ([id number]) -------------------------------------------------------------------------------- Multi Wound Chart Details Patient Name: [name] Date of Service: 07/06/2016 2:30 PM Medical Record Number: [id number] Patient Account Number: [id number] Date of Birth/Sex: 13-Oct-1928 (83 y.o. Female) Treating RN: Montey Hora Primary Care Patricio Popwell: Clayborn Bigness Other Clinician: Referring Josefita Weissmann: Clayborn Bigness Treating Birdie Beveridge/Extender: Frann Rider in Treatment: 9 Vital Signs Height(in): 60 Pulse(bpm): 76 Weight(lbs): 122 Blood Pressure 163/66 (mmHg): Body Mass Index(BMI): 24 Temperature(F): 98.1 Respiratory  Rate 18 (breaths/min): Photos: [N/A:N/A] Wound Location: Right Lower Leg - Anterior N/A N/A Wounding Event: Trauma N/A N/A Primary Etiology: Trauma, Other N/A N/A Comorbid History: Asthma, Hypertension N/A N/A Date Acquired: 04/21/2016 N/A N/A Weeks of Treatment: 7 N/A N/A Wound Status: Open N/A N/A Measurements L x W x D 10.5x1x1.4 N/A N/A (cm) Area (cm) : 8.247 N/A N/A Volume (cm) : 11.545 N/A N/A % Reduction in Area: 71.50% N/A N/A % Reduction in Volume: 76.50% N/A N/A Classification: Full Thickness Without N/A N/A Exposed Support Structures Exudate Amount: Large N/A N/A Exudate Type: Serous N/A N/A Exudate Color: amber N/A N/A Wound Margin: Flat and Intact N/A N/A Granulation Amount: Medium (34-66%) N/A N/A Granulation Quality: Red N/A N/A Necrotic Amount: Medium (34-66%) N/A N/A [name],  [name] ([id number]) Necrotic Tissue: Eschar, Adherent Slough N/A N/A Exposed Structures: Fascia: No N/A N/A Fat Layer (Subcutaneous Tissue) Exposed: No Tendon: No Muscle: No Joint: No Bone: No Limited to Skin Breakdown Epithelialization: Small (1-33%) N/A N/A Debridement: Debridement [id number] N/A N/A [id number]) Pre-procedure 15:15 N/A N/A Verification/Time Out Taken: Pain Control: Lidocaine 4% Topical N/A N/A Solution Tissue Debrided: Fibrin/Slough, N/A N/A Subcutaneous Level: Skin/Subcutaneous N/A N/A Tissue Debridement Area (sq 10.5 N/A N/A cm): Instrument: Curette N/A N/A Bleeding: Minimum N/A N/A Hemostasis Achieved: Pressure N/A N/A Procedural Pain: 0 N/A N/A Post Procedural Pain: 0 N/A N/A Debridement Treatment Procedure was tolerated N/A N/A Response: well Post Debridement 10.5x1x1.5 N/A N/A Measurements L x W x D (cm) Post Debridement 12.37 N/A N/A Volume: (cm) Periwound Skin Texture: Excoriation: No N/A N/A Induration: No Callus: No Crepitus: No Rash: No Scarring: No Periwound Skin Maceration: No N/A N/A Moisture: Dry/Scaly: No Periwound Skin  Color: Erythema: Yes N/A N/A Atrophie Blanche: No Cyanosis: No Ecchymosis: No Hemosiderin Staining: No Mottled: No [name], [name] ([id number]) Pallor: No Rubor: No Erythema Location: Circumferential N/A N/A Tenderness on Yes N/A N/A Palpation: Wound Preparation: Ulcer Cleansing: N/A N/A Rinsed/Irrigated with Saline Topical Anesthetic Applied: Other: lidocaine 4% Procedures Performed: Debridement N/A N/A Treatment Notes Wound #4 (Right, Anterior Lower Leg) 1. Cleansed with: Clean wound with Normal Saline 2. Anesthetic Topical Lidocaine 4% cream to wound bed prior to debridement 4. Dressing Applied: Aquacel Ag 5. Secondary Dressing Applied Non-Adherent pad Guaze, ABD and kerlix/Conform 7. Secured with Recruitment consultant) Signed: 07/06/2016 3:20:56 PM By: Christin Fudge MD, FACS Entered By: Christin Fudge on 07/06/2016 15:20:56 [name], [name] ([id number]) -------------------------------------------------------------------------------- [place] Details Patient Name: [name] Date of Service: 07/06/2016 2:30 PM Medical Record Number: [id number] Patient Account Number: [id number] Date of Birth/Sex: 09/14/1928 (83 y.o. Female) Treating RN: Montey Hora Primary Care Oneida Mckamey: Clayborn Bigness Other Clinician: Referring Vail Basista: Clayborn Bigness Treating Persais Ethridge/Extender: Frann Rider in Treatment: 9 Active Inactive ` Abuse / Safety / Falls / Self Care Management Nursing Diagnoses: Impaired physical mobility Potential for falls Goals: Patient will remain injury free Date Initiated: 05/04/2016 Target Resolution Date: 07/25/2016 Goal Status: Active Interventions: Assess fall risk on admission and as needed Notes: ` Orientation to the Wound Care Program Nursing Diagnoses: Knowledge deficit related to the wound healing center program Goals: Patient/caregiver will verbalize understanding of the [facility] Date Initiated: 05/04/2016 Target Resolution Date: 07/25/2016 Goal Status: Active Interventions: Provide education on orientation to the wound center Notes: ` Pain, Acute or Chronic Nursing Diagnoses: Pain, acute or chronic: actual or potential [name], [name] ([id number]) Goals: Patient/caregiver will verbalize adequate pain control between visits Date Initiated: 05/04/2016 Target Resolution Date: 07/25/2016 Goal Status: Active Interventions: Complete pain assessment as per visit requirements Notes: ` Wound/Skin Impairment Nursing Diagnoses: Impaired tissue integrity Goals: Patient/caregiver will verbalize understanding of skin care regimen Date Initiated: 05/04/2016 Target Resolution Date: 07/25/2016 Goal Status: Active Ulcer/skin breakdown will have a volume reduction of 30% by week 4 Date Initiated: 05/04/2016 Target Resolution Date: 07/25/2016 Goal Status: Active Ulcer/skin breakdown will have a volume reduction of 50% by week 8 Date Initiated: 05/04/2016 Target Resolution Date: 08/22/2016 Goal Status: Active Ulcer/skin breakdown will have a volume reduction of 80% by week 12 Date Initiated: 05/04/2016 Target Resolution Date: 08/22/2016 Goal Status: Active Ulcer/skin breakdown will heal within 14 weeks Date Initiated: 05/04/2016 Target Resolution Date: 09/05/2016 Goal Status: Active Interventions: Assess patient/caregiver ability to obtain necessary supplies Assess patient/caregiver ability to perform ulcer/skin care  regimen upon admission and as needed Assess ulceration(s) every visit Notes: Electronic Signature(s) Signed: 07/06/2016 5:25:14 PM By: Montey Hora Entered By: Montey Hora on 07/06/2016 15:17:29 [name], [name] ([id number]) -------------------------------------------------------------------------------- Pain Assessment Details Patient Name: [name] Date of Service: 07/06/2016 2:30 PM Medical Record Number: [id number] Patient  Account Number: [id number] Date of Birth/Sex: [birth year] (83 y.o. Female) Treating RN: Montey Hora Primary Care Cattleya Dobratz: Clayborn Bigness Other Clinician: Referring Adreonna Yontz: Clayborn Bigness Treating Brynnlee Cumpian/Extender: Frann Rider in Treatment: 9 Active Problems Location of Pain Severity and Description of Pain Patient Has Paino Yes Site Locations Pain Location: Generalized Pain With Dressing Change: Yes Duration of the Pain. Constant / Intermittento Constant Pain Management and Medication Current Pain Management: Notes Topical or injectable lidocaine is offered to patient for acute pain when surgical debridement is performed. If needed, Patient is instructed to use over the counter pain medication for the following 24-48 hours after debridement. Wound care MDs do not prescribed pain medications. Patient has chronic pain or uncontrolled pain. Patient has been instructed to make an appointment with their Primary Care Physician for pain management. Electronic Signature(s) Signed: 07/06/2016 5:25:14 PM By: Montey Hora Entered By: Montey Hora on 07/06/2016 14:48:16 [name] ([id number]) -------------------------------------------------------------------------------- Patient/Caregiver Education Details Patient Name: [name] Date of Service: 07/06/2016 2:30 PM Medical Record Number: [id number] Patient Account Number: [id number] Date of Birth/Gender: Oct 07, 1928 (83 y.o. Female) Treating RN: Montey Hora Primary Care Physician: Clayborn Bigness Other Clinician: Referring Physician: Clayborn Bigness Treating Physician/Extender: Frann Rider in Treatment: 9 Education Assessment Education Provided To: Patient and Caregiver Education Topics Provided Wound/Skin Impairment: Handouts: Other: wound care as ordered Methods: Demonstration, Explain/Verbal Responses: State content correctly Electronic Signature(s) Signed: 07/06/2016 5:25:14 PM By: Montey Hora Entered By: Montey Hora on 07/06/2016 15:20:09 [name] ([id number]) -------------------------------------------------------------------------------- Wound Assessment Details Patient Name: [name] Date of Service: 07/06/2016 2:30 PM Medical Record Number: [id number] Patient Account Number: [id number] Date of Birth/Sex: May 20, 1929 (83 y.o. Female) Treating RN: Montey Hora Primary Care Kathyrn Warmuth: Clayborn Bigness Other Clinician: Referring Gabreal Worton: Clayborn Bigness Treating Brookelyn Gaynor/Extender: Frann Rider in Treatment: 9 Wound Status Wound Number: 4 Primary Etiology: Trauma, Other Wound Location: Right Lower Leg - Anterior Wound Status: Open Wounding Event: Trauma Comorbid History: Asthma, Hypertension Date Acquired: 04/21/2016 Weeks Of Treatment: 7 Clustered Wound: No Photos Wound Measurements Length: (cm) 10.5 Width: (cm) 1 Depth: (cm) 1.4 Area: (cm) 8.247 Volume: (cm) 11.545 % Reduction in Area: 71.5% % Reduction in Volume: 76.5% Epithelialization: Small (1-33%) Tunneling: No Undermining: No Wound Description Full Thickness Without Exposed Foul Odor Aft Classification: Support Structures Wound Margin: Flat and Intact Exudate Large Amount: Exudate Type: Serous Exudate Color: amber er Cleansing: No Wound Bed Granulation Amount: Medium (34-66%) Exposed Structure Granulation Quality: Red Fascia Exposed: No Necrotic Amount: Medium (34-66%) Fat Layer (Subcutaneous Tissue) Exposed: No [name], [name] ([id number]) Necrotic Quality: Eschar, Adherent Slough Tendon Exposed: No Muscle Exposed: No Joint Exposed: No Bone Exposed: No Limited to Skin Breakdown Periwound Skin Texture Texture Color No Abnormalities Noted: No No Abnormalities Noted: No Callus: No Atrophie Blanche: No Crepitus: No Cyanosis: No Excoriation: No Ecchymosis: No Induration: No Erythema: Yes Rash: No Erythema Location: Circumferential Scarring:  No Hemosiderin Staining: No Mottled: No Moisture Pallor: No No Abnormalities Noted: No Rubor: No Dry / Scaly: No Maceration: No Temperature / Pain Tenderness on Palpation: Yes Wound Preparation Ulcer Cleansing: Rinsed/Irrigated with Saline Topical Anesthetic Applied: Other: lidocaine 4%, Treatment Notes Wound #4 (Right, Anterior Lower Leg) 1. Cleansed with: Clean wound with Normal Saline  2. Anesthetic Topical Lidocaine 4% cream to wound bed prior to debridement 4. Dressing Applied: Aquacel Ag 5. Secondary Dressing Applied Non-Adherent pad Guaze, ABD and kerlix/Conform 7. Secured with Recruitment consultant) Signed: 07/06/2016 5:25:14 PM By: Montey Hora Entered By: Montey Hora on 07/06/2016 15:14:07 [name] ([id number]) -------------------------------------------------------------------------------- [name] Details Patient Name: [name] Date of Service: 07/06/2016 2:30 PM Medical Record Number: [id number] Patient Account Number: [id number] Date of Birth/Sex: [birth year] (83 y.o. Female) Treating RN: Montey Hora Primary Care Lilleigh Hechavarria: Clayborn Bigness Other Clinician: Referring Theador Jezewski: Clayborn Bigness Treating Wing Schoch/Extender: Frann Rider in Treatment: 9 Vital Signs Time Taken: 14:50 Temperature (F): 98.1 Height (in): 60 Pulse (bpm): 76 Weight (lbs): 122 Respiratory Rate (breaths/min): 18 Body Mass Index (BMI): 23.8 Blood Pressure (mmHg): 163/66 Reference Range: 80 - 120 mg / dl Electronic Signature(s) Signed: 07/06/2016 5:25:14 PM By: Montey Hora Entered By: Montey Hora on 07/06/2016 14:51:04
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
## 2016-07-14 ENCOUNTER — Encounter: Payer: Medicare Other | Admitting: Surgery

## 2016-07-14 DIAGNOSIS — C44722 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, including hip: Secondary | ICD-10-CM | POA: Diagnosis not present

## 2016-07-15 NOTE — Progress Notes (Signed)
[name], [name] ([id number]) Visit Report for 07/14/2016 Arrival Information Details Patient Name: [name], [name] Date of Service: 07/14/2016 1:30 PM Medical Record Number: [id number] Patient Account Number: [id number] Date of Birth/Sex: 1928-06-14 (83 y.o. Female) Treating RN: [name] Primary Care [name]: [name] Other Clinician: Referring [name]: [name] Treating [name]/Extender: [name] in Treatment: 10 Visit Information History Since Last Visit Added or deleted any medications: No Patient Arrived: Ambulatory Any new allergies or adverse reactions: No Arrival Time: 13:52 Had a fall or experienced change in No Accompanied By: dtr activities of daily living that may affect Transfer Assistance: None risk of falls: Patient Identification Verified: Yes Signs or symptoms of abuse/neglect since last No Secondary Verification Process Yes visito Completed: Hospitalized since last visit: No Patient Requires Transmission-Based No Has Dressing in Place as Prescribed: Yes Precautions: Pain Present Now: Yes Patient Has Alerts: No Electronic Signature(s) Signed: 07/14/2016 4:31:46 PM By: [name] Entered By: [name] on 07/14/2016 13:52:29 [name] ([id number]) -------------------------------------------------------------------------------- Encounter Discharge Information Details Patient Name: [name] Date of Service: 07/14/2016 1:30 PM Medical Record Number: [id number] Patient Account Number: [id number] Date of Birth/Sex: 1928-06-22 (83 y.o. Female) Treating RN: [name] Primary Care Collyn Ribas: [name] Other Clinician: Referring [name]: [name] Treating [name]/Extender: [name] in Treatment: 10 Encounter Discharge Information Items Discharge Pain Level: 0 Discharge Condition: Stable Ambulatory Status: Ambulatory Discharge Destination: Home Transportation: Private Auto Accompanied  By: dtr Schedule Follow-up Appointment: Yes Medication Reconciliation completed and provided to Patient/Care No Mica Ramdass: Provided on Clinical Summary of Care: 07/14/2016 Form Type Recipient Paper Patient LP Electronic Signature(s) Signed: 07/14/2016 2:34:17 PM By: [name] Entered By: [name] on 07/14/2016 14:34:17 [name], [name] ([id number]) -------------------------------------------------------------------------------- Lower Extremity Assessment Details Patient Name: [name] Date of Service: 07/14/2016 1:30 PM Medical Record Number: [id number] Patient Account Number: [id number] Date of Birth/Sex: Apr 19, 1929 (83 y.o. Female) Treating RN: [name] Primary Care [name]: [name] Other Clinician: Referring [name]: [name] Treating Dann Galicia/Extender: [name] in Treatment: 10 Vascular Assessment Pulses: Dorsalis Pedis Palpable: [Right:Yes] Posterior Tibial Extremity colors, hair growth, and conditions: Extremity Color: [Right:Mottled] Hair Growth on Extremity: [Right:No] Temperature of Extremity: [Right:Warm] Capillary Refill: [Right:< 3 seconds] Electronic Signature(s) Signed: 07/14/2016 4:31:46 PM By: [name] Entered By: [name] on 07/14/2016 14:18:11 [name], [name] ([id number]) -------------------------------------------------------------------------------- Multi Wound Chart Details Patient Name: [name] Date of Service: 07/14/2016 1:30 PM Medical Record Number: [id number] Patient Account Number: [id number] Date of Birth/Sex: 23-Jul-1928 (83 y.o. Female) Treating RN: [name] Primary Care [name]: [name] Other Clinician: Referring [name]: [name] Treating [name]/Extender: [name] in Treatment: 10 Vital Signs Height(in): 60 Pulse(bpm): 77 Weight(lbs): 122 Blood Pressure 179/75 (mmHg): Body Mass Index(BMI): 24 Temperature(F): 98.4 Respiratory  Rate 18 (breaths/min): Photos: [N/A:N/A] Wound Location: Right Lower Leg - Anterior N/A N/A Wounding Event: Trauma N/A N/A Primary Etiology: Trauma, Other N/A N/A Comorbid History: Asthma, Hypertension N/A N/A Date Acquired: 04/21/2016 N/A N/A Weeks of Treatment: 8 N/A N/A Wound Status: Open N/A N/A Measurements L x W x D 10.2x0.9x1.3 N/A N/A (cm) Area (cm) : 7.21 N/A N/A Volume (cm) : 9.373 N/A N/A % Reduction in Area: 75.10% N/A N/A % Reduction in Volume: 80.90% N/A N/A Classification: Full Thickness Without N/A N/A Exposed Support Structures Exudate Amount: Large N/A N/A Exudate Type: Serous N/A N/A Exudate Color: amber N/A N/A Wound Margin: Flat and Intact N/A N/A Granulation Amount: Medium (34-66%) N/A N/A Granulation Quality: Red N/A N/A Necrotic Amount: Medium (34-66%) N/A N/A [name],  [name] ([id number]) Necrotic Tissue: Eschar, Adherent Slough N/A N/A Exposed Structures: Fascia: No N/A N/A Fat Layer (Subcutaneous Tissue) Exposed: No Tendon: No Muscle: No Joint: No Bone: No Limited to Skin Breakdown Epithelialization: Small (1-33%) N/A N/A Debridement: Debridement [id number] N/A N/A [id number]) Pre-procedure 14:19 N/A N/A Verification/Time Out Taken: Pain Control: Lidocaine 4% Topical N/A N/A Solution Tissue Debrided: Fibrin/Slough, N/A N/A Subcutaneous Level: Skin/Subcutaneous N/A N/A Tissue Debridement Area (sq 9.18 N/A N/A cm): Instrument: Curette N/A N/A Bleeding: Minimum N/A N/A Hemostasis Achieved: Pressure N/A N/A Procedural Pain: 0 N/A N/A Post Procedural Pain: 0 N/A N/A Debridement Treatment Procedure was tolerated N/A N/A Response: well Post Debridement 10.2x0.9x1.3 N/A N/A Measurements L x W x D (cm) Post Debridement 9.373 N/A N/A Volume: (cm) Periwound Skin Texture: Excoriation: No N/A N/A Induration: No Callus: No Crepitus: No Rash: No Scarring: No Periwound Skin Maceration: No N/A N/A Moisture: Dry/Scaly: No Periwound  Skin Color: Erythema: Yes N/A N/A Atrophie Blanche: No Cyanosis: No Ecchymosis: No Hemosiderin Staining: No Mottled: No [name], [name] ([id number]) Pallor: No Rubor: No Erythema Location: Circumferential N/A N/A Tenderness on Yes N/A N/A Palpation: Wound Preparation: Ulcer Cleansing: N/A N/A Rinsed/Irrigated with Saline Topical Anesthetic Applied: Other: lidocaine 4% Procedures Performed: Debridement N/A N/A Treatment Notes Wound #4 (Right, Anterior Lower Leg) 1. Cleansed with: Clean wound with Normal Saline 2. Anesthetic Topical Lidocaine 4% cream to wound bed prior to debridement 4. Dressing Applied: Aquacel Ag 5. Secondary Dressing Applied Guaze, ABD and kerlix/Conform 7. Secured with Recruitment consultant) Signed: 07/14/2016 2:26:27 PM By: Christin Fudge MD, FACS Entered By: Christin Fudge on 07/14/2016 14:26:27 [name], [name] ([id number]) -------------------------------------------------------------------------------- [place] Details Patient Name: [name] Date of Service: 07/14/2016 1:30 PM Medical Record Number: [id number] Patient Account Number: [id number] Date of Birth/Sex: 09-29-1928 (83 y.o. Female) Treating RN: [name] Primary Care Iyonna Rish: [name] Other Clinician: Referring Dalisa Forrer: [name] Treating Levy Wellman/Extender: [name] in Treatment: 10 Active Inactive ` Abuse / Safety / Falls / Self Care Management Nursing Diagnoses: Impaired physical mobility Potential for falls Goals: Patient will remain injury free Date Initiated: 05/04/2016 Target Resolution Date: 07/25/2016 Goal Status: Active Interventions: Assess fall risk on admission and as needed Notes: ` Orientation to the Wound Care Program Nursing Diagnoses: Knowledge deficit related to the wound healing center program Goals: Patient/caregiver will verbalize understanding of the [facility] Date  Initiated: 05/04/2016 Target Resolution Date: 07/25/2016 Goal Status: Active Interventions: Provide education on orientation to the wound center Notes: ` Pain, Acute or Chronic Nursing Diagnoses: Pain, acute or chronic: actual or potential [name], [name] ([id number]) Goals: Patient/caregiver will verbalize adequate pain control between visits Date Initiated: 05/04/2016 Target Resolution Date: 07/25/2016 Goal Status: Active Interventions: Complete pain assessment as per visit requirements Notes: ` Wound/Skin Impairment Nursing Diagnoses: Impaired tissue integrity Goals: Patient/caregiver will verbalize understanding of skin care regimen Date Initiated: 05/04/2016 Target Resolution Date: 07/25/2016 Goal Status: Active Ulcer/skin breakdown will have a volume reduction of 30% by week 4 Date Initiated: 05/04/2016 Target Resolution Date: 07/25/2016 Goal Status: Active Ulcer/skin breakdown will have a volume reduction of 50% by week 8 Date Initiated: 05/04/2016 Target Resolution Date: 08/22/2016 Goal Status: Active Ulcer/skin breakdown will have a volume reduction of 80% by week 12 Date Initiated: 05/04/2016 Target Resolution Date: 08/22/2016 Goal Status: Active Ulcer/skin breakdown will heal within 14 weeks Date Initiated: 05/04/2016 Target Resolution Date: 09/05/2016 Goal Status: Active Interventions: Assess patient/caregiver ability to obtain necessary supplies Assess patient/caregiver ability to perform ulcer/skin care regimen upon  admission and as needed Assess ulceration(s) every visit Notes: Electronic Signature(s) Signed: 07/14/2016 4:31:46 PM By: [name] Entered By: [name] on 07/14/2016 14:18:15 [name], [name] ([id number]) -------------------------------------------------------------------------------- Pain Assessment Details Patient Name: [name] Date of Service: 07/14/2016 1:30 PM Medical Record Number: [id number] Patient Account Number:  [id number] Date of Birth/Sex: 19-Aug-1928 (83 y.o. Female) Treating RN: [name] Primary Care Myisha Pickerel: [name] Other Clinician: Referring Halaina Vanduzer: [name] Treating Amyri Frenz/Extender: [name] in Treatment: 10 Active Problems Location of Pain Severity and Description of Pain Patient Has Paino Yes Site Locations Pain Location: Generalized Pain With Dressing Change: Yes Duration of the Pain. Constant / Intermittento Constant Pain Management and Medication Current Pain Management: Notes Topical or injectable lidocaine is offered to patient for acute pain when surgical debridement is performed. If needed, Patient is instructed to use over the counter pain medication for the following 24-48 hours after debridement. Wound care MDs do not prescribed pain medications. Patient has chronic pain or uncontrolled pain. Patient has been instructed to make an appointment with their Primary Care Physician for pain management. Electronic Signature(s) Signed: 07/14/2016 4:31:46 PM By: [name] Entered By: [name] on 07/14/2016 13:52:42 [name] ([id number]) -------------------------------------------------------------------------------- Patient/Caregiver Education Details Patient Name: [name] Date of Service: 07/14/2016 1:30 PM Medical Record Number: [id number] Patient Account Number: [id number] Date of Birth/Gender: [birth year] (83 y.o. Female) Treating RN: [name] Primary Care Physician: [name] Other Clinician: Referring Physician: Clayborn [name] Treating Physician/Extender: [name] in Treatment: 10 Education Assessment Education Provided To: Patient and Caregiver Education Topics Provided Wound/Skin Impairment: Handouts: Other: wound care as ordered Methods: Demonstration, Explain/Verbal Responses: State content correctly Electronic Signature(s) Signed: 07/14/2016 4:31:46 PM By: [name] Entered By:  [name] on 07/14/2016 14:23:18 [name] ([id number]) -------------------------------------------------------------------------------- Wound Assessment Details Patient Name: [name] Date of Service: 07/14/2016 1:30 PM Medical Record Number: [id number] Patient Account Number: [id number] Date of Birth/Sex: [birth year] (83 y.o. Female) Treating RN: [name] Primary Care Carlethia Mesquita: [name] Other Clinician: Referring Lisel Siegrist: [name] Treating Samatha Anspach/Extender: [name] in Treatment: 10 Wound Status Wound Number: 4 Primary Etiology: Trauma, Other Wound Location: Right Lower Leg - Anterior Wound Status: Open Wounding Event: Trauma Comorbid History: Asthma, Hypertension Date Acquired: 04/21/2016 Weeks Of Treatment: 8 Clustered Wound: No Photos Wound Measurements Length: (cm) 10.2 Width: (cm) 0.9 Depth: (cm) 1.3 Area: (cm) 7.21 Volume: (cm) 9.373 % Reduction in Area: 75.1% % Reduction in Volume: 80.9% Epithelialization: Small (1-33%) Tunneling: No Undermining: No Wound Description Full Thickness Without Exposed Foul Odor Aft Classification: Support Structures Wound Margin: Flat and Intact Exudate Large Amount: Exudate Type: Serous Exudate Color: amber er Cleansing: No Wound Bed Granulation Amount: Medium (34-66%) Exposed Structure Granulation Quality: Red Fascia Exposed: No Necrotic Amount: Medium (34-66%) Fat Layer (Subcutaneous Tissue) Exposed: No [name], [name] ([id number]) Necrotic Quality: Eschar, Adherent Slough Tendon Exposed: No Muscle Exposed: No Joint Exposed: No Bone Exposed: No Limited to Skin Breakdown Periwound Skin Texture Texture Color No Abnormalities Noted: No No Abnormalities Noted: No Callus: No Atrophie Blanche: No Crepitus: No Cyanosis: No Excoriation: No Ecchymosis: No Induration: No Erythema: Yes Rash: No Erythema Location: Circumferential Scarring: No Hemosiderin Staining:  No Mottled: No Moisture Pallor: No No Abnormalities Noted: No Rubor: No Dry / Scaly: No Maceration: No Temperature / Pain Tenderness on Palpation: Yes Wound Preparation Ulcer Cleansing: Rinsed/Irrigated with Saline Topical Anesthetic Applied: Other: lidocaine 4%, Treatment Notes Wound #4 (Right, Anterior Lower Leg) 1. Cleansed with: Clean wound with Normal Saline 2. Anesthetic  Topical Lidocaine 4% cream to wound bed prior to debridement 4. Dressing Applied: Aquacel Ag 5. Secondary Dressing Applied Guaze, ABD and kerlix/Conform 7. Secured with Recruitment consultant) Signed: 07/14/2016 4:31:46 PM By: [name] Entered By: [name] on 07/14/2016 14:17:13 [name] ([id number]) -------------------------------------------------------------------------------- [place] Details Patient Name: [name] Date of Service: 07/14/2016 1:30 PM Medical Record Number: [id number] Patient Account Number: [id number] Date of Birth/Sex: [birth year] (83 y.o. Female) Treating RN: [name] Primary Care Sibel Khurana: [name] Other Clinician: Referring Tationa Stech: [name] Treating Ferrel Simington/Extender: [name] in Treatment: 10 Vital Signs Time Taken: 13:52 Temperature (F): 98.4 Height (in): 60 Pulse (bpm): 77 Weight (lbs): 122 Respiratory Rate (breaths/min): 18 Body Mass Index (BMI): 23.8 Blood Pressure (mmHg): 179/75 Reference Range: 80 - 120 mg / dl Electronic Signature(s) Signed: 07/14/2016 4:31:46 PM By: [name] Entered By: [name] on 07/14/2016 13:55:47
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
## 2016-07-20 ENCOUNTER — Encounter: Payer: Medicare Other | Admitting: Surgery

## 2016-07-20 DIAGNOSIS — C44722 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, including hip: Secondary | ICD-10-CM | POA: Diagnosis not present

## 2016-07-21 NOTE — Progress Notes (Signed)
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 right lower leg, init 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

## 2016-07-21 NOTE — Progress Notes (Signed)
BRENDALIS, VALENTINI ([id number]) Visit Report for 07/20/2016 Arrival Information Details Patient Name: [name], [name] Date of Service: 07/20/2016 2:30 PM Medical Record Number: [id number] Patient Account Number: [id number] Date of Birth/Sex: 25-Feb-1929 (83 y.o. Female) Treating RN: Montey Hora Primary Care Toshi Ishii: Clayborn Bigness Other Clinician: Referring Gaylan Fauver: Clayborn Bigness Treating Montrail Mehrer/Extender: Frann Rider in Treatment: 11 Visit Information History Since Last Visit Added or deleted any medications: No Patient Arrived: Ambulatory Any new allergies or adverse reactions: No Arrival Time: 15:02 Had a fall or experienced change in No Accompanied By: dtr activities of daily living that may affect Transfer Assistance: None risk of falls: Patient Identification Verified: Yes Signs or symptoms of abuse/neglect since last No Secondary Verification Process Yes visito Completed: Hospitalized since last visit: No Patient Requires Transmission-Based No Has Dressing in Place as Prescribed: Yes Precautions: Pain Present Now: Yes Patient Has Alerts: No Electronic Signature(s) Signed: 07/20/2016 5:50:12 PM By: Montey Hora Entered By: Montey Hora on 07/20/2016 15:02:51 [name] ([id number]) -------------------------------------------------------------------------------- Encounter Discharge Information Details Patient Name: [name] Date of Service: 07/20/2016 2:30 PM Medical Record Number: [id number] Patient Account Number: [id number] Date of Birth/Sex: May 26, 1929 (83 y.o. Female) Treating RN: Montey Hora Primary Care Shimshon Narula: Clayborn Bigness Other Clinician: Referring Elana Jian: Clayborn Bigness Treating Kellee Sittner/Extender: Frann Rider in Treatment: 42 Encounter Discharge Information Items Discharge Pain Level: 0 Discharge Condition: Stable Ambulatory Status: Ambulatory Discharge Destination: Home Transportation: Private Auto Accompanied  By: dtr Schedule Follow-up Appointment: Yes Medication Reconciliation completed and provided to Patient/Care No Missy Baksh: Provided on Clinical Summary of Care: 07/20/2016 Form Type Recipient Paper Patient LP Electronic Signature(s) Signed: 07/20/2016 3:55:11 PM By: Ruthine Dose Entered By: Ruthine Dose on 07/20/2016 15:55:10 [name] ([id number]) -------------------------------------------------------------------------------- Lower Extremity Assessment Details Patient Name: [name] Date of Service: 07/20/2016 2:30 PM Medical Record Number: [id number] Patient Account Number: [id number] Date of Birth/Sex: 1929-04-11 (83 y.o. Female) Treating RN: Montey Hora Primary Care Donoven Pett: Clayborn Bigness Other Clinician: Referring Izabella Marcantel: Clayborn Bigness Treating Briceson Broadwater/Extender: Frann Rider in Treatment: 11 Vascular Assessment Pulses: Dorsalis Pedis Palpable: [Right:Yes] Posterior Tibial Extremity colors, hair growth, and conditions: Extremity Color: [Right:Mottled] Hair Growth on Extremity: [Right:No] Temperature of Extremity: [Right:Warm] Capillary Refill: [Right:< 3 seconds] Electronic Signature(s) Signed: 07/20/2016 4:26:42 PM By: Montey Hora Entered By: Montey Hora on 07/20/2016 16:26:42 [name] ([id number]) -------------------------------------------------------------------------------- Multi Wound Chart Details Patient Name: [name] Date of Service: 07/20/2016 2:30 PM Medical Record Number: [id number] Patient Account Number: [id number] Date of Birth/Sex: 12/17/1928 (83 y.o. Female) Treating RN: Montey Hora Primary Care Beadie Matsunaga: Clayborn Bigness Other Clinician: Referring Khilee Hendricksen: Clayborn Bigness Treating Artha Chiasson/Extender: Frann Rider in Treatment: 11 Vital Signs Height(in): 60 Pulse(bpm): 70 Weight(lbs): 122 Blood Pressure 187/63 (mmHg): Body Mass Index(BMI): 24 Temperature(F): 98.1 Respiratory  Rate 18 (breaths/min): Photos: [N/A:N/A] Wound Location: Right Lower Leg - Right Lower Leg - Distal N/A Anterior, Proximal Wounding Event: Trauma Trauma N/A Primary Etiology: Trauma, Other Trauma, Other N/A Comorbid History: Asthma, Hypertension Asthma, Hypertension N/A Date Acquired: 04/21/2016 04/21/2016 N/A Weeks of Treatment: 9 0 N/A Wound Status: Open Open N/A Measurements L x W x D 3x0.7x1.3 4.2x1x1 N/A (cm) Area (cm) : 1.649 3.299 N/A Volume (cm) : 2.144 3.299 N/A % Reduction in Area: 94.30% 0.00% N/A % Reduction in Volume: 95.60% 0.00% N/A Classification: Full Thickness Without Full Thickness Without N/A Exposed Support Exposed Support Structures Structures Exudate Amount: Large Large N/A Exudate Type: Serous Serous N/A Exudate Color: amber amber N/A Wound Margin: Flat and Intact Flat and Intact N/A Granulation Amount: Large (67-100%)  Large (67-100%) N/A Granulation Quality: Red Red, Pink N/A [name], [name] ([id number]) Necrotic Amount: Small (1-33%) Small (1-33%) N/A Necrotic Tissue: Eschar, Adherent Slough Adherent Slough N/A Exposed Structures: Fascia: No Fascia: No N/A Fat Layer (Subcutaneous Fat Layer (Subcutaneous Tissue) Exposed: No Tissue) Exposed: No Tendon: No Tendon: No Muscle: No Muscle: No Joint: No Joint: No Bone: No Bone: No Limited to Skin Limited to Skin Breakdown Breakdown Epithelialization: Small (1-33%) Small (1-33%) N/A Debridement: Debridement [id number] Debridement [id number] N/A [id number]) 11047) Pre-procedure 15:27 15:29 N/A Verification/Time Out Taken: Pain Control: Lidocaine 4% Topical Lidocaine 4% Topical N/A Solution Solution Tissue Debrided: Fibrin/Slough, Fibrin/Slough, N/A Subcutaneous Subcutaneous Level: Skin/Subcutaneous Skin/Subcutaneous N/A Tissue Tissue Debridement Area (sq 2.1 4.2 N/A cm): Instrument: Curette Curette N/A Bleeding: Minimum Minimum N/A Hemostasis Achieved: Pressure Pressure N/A Procedural Pain: 0  0 N/A Post Procedural Pain: 0 0 N/A Debridement Treatment Procedure was tolerated Procedure was tolerated N/A Response: well well Post Debridement 3x0.7x1.4 4.2x1x1.1 N/A Measurements L x W x D (cm) Post Debridement 2.309 3.629 N/A Volume: (cm) Periwound Skin Texture: Excoriation: No Excoriation: No N/A Induration: No Induration: No Callus: No Callus: No Crepitus: No Crepitus: No Rash: No Rash: No Scarring: No Scarring: No Periwound Skin Maceration: No Maceration: No N/A Moisture: Dry/Scaly: No Dry/Scaly: No Periwound Skin Color: Erythema: Yes Atrophie Blanche: No N/A Atrophie Blanche: No Cyanosis: No Cyanosis: No Ecchymosis: No Ecchymosis: No Erythema: No Hemosiderin Staining: No Hemosiderin Staining: No [name], [name] ([id number]) Mottled: No Mottled: No Pallor: No Pallor: No Rubor: No Rubor: No Erythema Location: Circumferential N/A N/A Tenderness on Yes Yes N/A Palpation: Wound Preparation: Ulcer Cleansing: Ulcer Cleansing: N/A Rinsed/Irrigated with Rinsed/Irrigated with Saline Saline Topical Anesthetic Topical Anesthetic Applied: Other: lidocaine Applied: Other: lidocaine 4% 4% Procedures Performed: Debridement Debridement N/A Treatment Notes Electronic Signature(s) Signed: 07/20/2016 3:33:17 PM By: Christin Fudge MD, FACS Entered By: Christin Fudge on 07/20/2016 15:33:16 [name], [name] ([id number]) -------------------------------------------------------------------------------- Multi-Disciplinary Care Plan Details Patient Name: [name] Date of Service: 07/20/2016 2:30 PM Medical Record Number: [id number] Patient Account Number: [id number] Date of Birth/Sex: 13-Sep-1928 (83 y.o. Female) Treating RN: Montey Hora Primary Care Alechia Lezama: Clayborn Bigness Other Clinician: Referring Persia Lintner: Clayborn Bigness Treating Clova Morlock/Extender: Frann Rider in Treatment: 11 Active Inactive ` Abuse / Safety / Falls / Self Care  Management Nursing Diagnoses: Impaired physical mobility Potential for falls Goals: Patient will remain injury free Date Initiated: 05/04/2016 Target Resolution Date: 07/25/2016 Goal Status: Active Interventions: Assess fall risk on admission and as needed Notes: ` Orientation to the Wound Care Program Nursing Diagnoses: Knowledge deficit related to the wound healing center program Goals: Patient/caregiver will verbalize understanding of the [place] Date Initiated: 05/04/2016 Target Resolution Date: 07/25/2016 Goal Status: Active Interventions: Provide education on orientation to the wound center Notes: ` Pain, Acute or Chronic Nursing Diagnoses: Pain, acute or chronic: actual or potential ALLIANNA, WIGGIN ([id number]) Goals: Patient/caregiver will verbalize adequate pain control between visits Date Initiated: 05/04/2016 Target Resolution Date: 07/25/2016 Goal Status: Active Interventions: Complete pain assessment as per visit requirements Notes: ` Wound/Skin Impairment Nursing Diagnoses: Impaired tissue integrity Goals: Patient/caregiver will verbalize understanding of skin care regimen Date Initiated: 05/04/2016 Target Resolution Date: 07/25/2016 Goal Status: Active Ulcer/skin breakdown will have a volume reduction of 30% by week 4 Date Initiated: 05/04/2016 Target Resolution Date: 07/25/2016 Goal Status: Active Ulcer/skin breakdown will have a volume reduction of 50% by week 8 Date Initiated: 05/04/2016 Target Resolution Date: 08/22/2016 Goal Status: Active Ulcer/skin breakdown will have a volume reduction of 80% by  week 12 Date Initiated: 05/04/2016 Target Resolution Date: 08/22/2016 Goal Status: Active Ulcer/skin breakdown will heal within 14 weeks Date Initiated: 05/04/2016 Target Resolution Date: 09/05/2016 Goal Status: Active Interventions: Assess patient/caregiver ability to obtain necessary supplies Assess patient/caregiver ability to  perform ulcer/skin care regimen upon admission and as needed Assess ulceration(s) every visit Notes: Electronic Signature(s) Signed: 07/20/2016 5:50:12 PM By: Montey Hora Entered By: Montey Hora on 07/20/2016 15:28:35 [name] ([id number]) -------------------------------------------------------------------------------- Pain Assessment Details Patient Name: [name] Date of Service: 07/20/2016 2:30 PM Medical Record Number: [id number] Patient Account Number: [id number] Date of Birth/Sex: May 03, 1929 (83 y.o. Female) Treating RN: Montey Hora Primary Care Idabelle Mcpeters: Clayborn Bigness Other Clinician: Referring Domitila Stetler: Clayborn Bigness Treating Cheray Pardi/Extender: Frann Rider in Treatment: 11 Active Problems Location of Pain Severity and Description of Pain Patient Has Paino Yes Site Locations Pain Location: Generalized Pain With Dressing Change: Yes Duration of the Pain. Constant / Intermittento Constant Pain Management and Medication Current Pain Management: Notes Topical or injectable lidocaine is offered to patient for acute pain when surgical debridement is performed. If needed, Patient is instructed to use over the counter pain medication for the following 24-48 hours after debridement. Wound care MDs do not prescribed pain medications. Patient has chronic pain or uncontrolled pain. Patient has been instructed to make an appointment with their Primary Care Physician for pain management. Electronic Signature(s) Signed: 07/20/2016 5:50:12 PM By: Montey Hora Entered By: Montey Hora on 07/20/2016 15:03:14 [name] ([id number]) -------------------------------------------------------------------------------- Patient/Caregiver Education Details Patient Name: [name] Date of Service: 07/20/2016 2:30 PM Medical Record Number: [id number] Patient Account Number: [id number] Date of Birth/Gender: [birth year] (83 y.o. Female) Treating RN:  Montey Hora Primary Care Physician: Clayborn Bigness Other Clinician: Referring Physician: Clayborn Bigness Treating Physician/Extender: Frann Rider in Treatment: 11 Education Assessment Education Provided To: Patient and Caregiver Education Topics Provided Wound/Skin Impairment: Handouts: Other: wound care as ordered Methods: Demonstration, Explain/Verbal Responses: State content correctly Electronic Signature(s) Signed: 07/20/2016 5:50:12 PM By: Montey Hora Entered By: Montey Hora on 07/20/2016 15:40:08 [name] ([id number]) -------------------------------------------------------------------------------- Wound Assessment Details Patient Name: [name] Date of Service: 07/20/2016 2:30 PM Medical Record Number: [id number] Patient Account Number: [id number] Date of Birth/Sex: [birth year] (83 y.o. Female) Treating RN: Montey Hora Primary Care Dyane Broberg: Clayborn Bigness Other Clinician: Referring [name] Pickert: Clayborn Bigness Treating Brittnye Josephs/Extender: Frann Rider in Treatment: 11 Wound Status Wound Number: 4 Primary Etiology: Trauma, Other Wound Location: Right Lower Leg - Anterior, Wound Status: Open Proximal Comorbid History: Asthma, Hypertension Wounding Event: Trauma Date Acquired: 04/21/2016 Weeks Of Treatment: 9 Clustered Wound: No Photos Wound Measurements Length: (cm) 3 Width: (cm) 0.7 Depth: (cm) 1.3 Area: (cm) 1.649 Volume: (cm) 2.144 % Reduction in Area: 94.3% % Reduction in Volume: 95.6% Epithelialization: Small (1-33%) Tunneling: No Undermining: No Wound Description Full Thickness Without Exposed Foul Odor Aft Classification: Support Structures Slough/Fibrin Wound Margin: Flat and Intact Exudate Large Amount: Exudate Type: Serous Exudate Color: amber er Cleansing: No o No Wound Bed Granulation Amount: Large (67-100%) Exposed Structure Granulation Quality: Red Fascia Exposed: No Necrotic Amount: Small (1-33%) Fat  Layer (Subcutaneous Tissue) Exposed: No [name], [name] ([id number]) Necrotic Quality: Eschar, Adherent Slough Tendon Exposed: No Muscle Exposed: No Joint Exposed: No Bone Exposed: No Limited to Skin Breakdown Periwound Skin Texture Texture Color No Abnormalities Noted: No No Abnormalities Noted: No Callus: No Atrophie Blanche: No Crepitus: No Cyanosis: No Excoriation: No Ecchymosis: No Induration: No Erythema: Yes Rash: No Erythema Location: Circumferential Scarring: No Hemosiderin Staining: No Mottled: No Moisture Pallor: No  No Abnormalities Noted: No Rubor: No Dry / Scaly: No Maceration: No Temperature / Pain Tenderness on Palpation: Yes Wound Preparation Ulcer Cleansing: Rinsed/Irrigated with Saline Topical Anesthetic Applied: Other: lidocaine 4%, Treatment Notes Wound #4 (Right, Proximal, Anterior Lower Leg) 1. Cleansed with: Clean wound with Normal Saline 2. Anesthetic Topical Lidocaine 4% cream to wound bed prior to debridement 4. Dressing Applied: Iodosorb Ointment Plain packing gauze 5. Secondary Dressing Applied ABD Pad Kerlix/Conform Non-Adherent pad 7. Secured with Recruitment consultant) Signed: 07/20/2016 5:50:12 PM By: Montey Hora Entered By: Montey Hora on 07/20/2016 15:27:25 [name], [name] ([id number]) -------------------------------------------------------------------------------- Wound Assessment Details Patient Name: [name] Date of Service: 07/20/2016 2:30 PM Medical Record Number: [id number] Patient Account Number: [id number] Date of Birth/Sex: 06-23-1928 (83 y.o. Female) Treating RN: Montey Hora Primary Care Lakyia Behe: Clayborn Bigness Other Clinician: Referring Florencio Hollibaugh: Clayborn Bigness Treating Vanilla Heatherington/Extender: Frann Rider in Treatment: 11 Wound Status Wound Number: 5 Primary Etiology: Trauma, Other Wound Location: Right Lower Leg - Distal Wound Status: Open Wounding Event: Trauma Comorbid  History: Asthma, Hypertension Date Acquired: 04/21/2016 Weeks Of Treatment: 0 Clustered Wound: No Photos Wound Measurements Length: (cm) 4.2 Width: (cm) 1 Depth: (cm) 1 Area: (cm) 3.299 Volume: (cm) 3.299 % Reduction in Area: 0% % Reduction in Volume: 0% Epithelialization: Small (1-33%) Tunneling: No Undermining: No Wound Description Full Thickness Without Exposed Foul Odor Aft Classification: Support Structures Slough/Fibrin Wound Margin: Flat and Intact Exudate Large Amount: Exudate Type: Serous Exudate Color: amber er Cleansing: No o No Wound Bed Granulation Amount: Large (67-100%) Exposed Structure Granulation Quality: Red, Pink Fascia Exposed: No Necrotic Amount: Small (1-33%) Fat Layer (Subcutaneous Tissue) Exposed: No [name], [name] ([id number]) Necrotic Quality: Adherent Slough Tendon Exposed: No Muscle Exposed: No Joint Exposed: No Bone Exposed: No Limited to Skin Breakdown Periwound Skin Texture Texture Color No Abnormalities Noted: No No Abnormalities Noted: No Callus: No Atrophie Blanche: No Crepitus: No Cyanosis: No Excoriation: No Ecchymosis: No Induration: No Erythema: No Rash: No Hemosiderin Staining: No Scarring: No Mottled: No Pallor: No Moisture Rubor: No No Abnormalities Noted: No Dry / Scaly: No Temperature / Pain Maceration: No Tenderness on Palpation: Yes Wound Preparation Ulcer Cleansing: Rinsed/Irrigated with Saline Topical Anesthetic Applied: Other: lidocaine 4%, Treatment Notes Wound #5 (Right, Distal Lower Leg) 1. Cleansed with: Clean wound with Normal Saline 2. Anesthetic Topical Lidocaine 4% cream to wound bed prior to debridement 4. Dressing Applied: Iodosorb Ointment Plain packing gauze 5. Secondary Dressing Applied ABD Pad Kerlix/Conform Non-Adherent pad 7. Secured with Recruitment consultant) Signed: 07/20/2016 5:50:12 PM By: Montey Hora Entered By: Montey Hora on 07/20/2016  15:28:24 [name], [name] ([id number]) -------------------------------------------------------------------------------- [place] Details Patient Name: [name] Date of Service: 07/20/2016 2:30 PM Medical Record Number: [id number] Patient Account Number: [id number] Date of Birth/Sex: 11-Apr-1929 (83 y.o. Female) Treating RN: Montey Hora Primary Care Natale Thoma: Clayborn Bigness Other Clinician: Referring Sky Primo: Clayborn Bigness Treating Johntavius Shepard/Extender: Frann Rider in Treatment: 11 Vital Signs Time Taken: 15:03 Temperature (F): 98.1 Height (in): 60 Pulse (bpm): 70 Weight (lbs): 122 Respiratory Rate (breaths/min): 18 Body Mass Index (BMI): 23.8 Blood Pressure (mmHg): 187/63 Reference Range: 80 - 120 mg / dl Electronic Signature(s) Signed: 07/20/2016 5:50:12 PM By: Montey Hora Entered By: Montey Hora on 07/20/2016 15:07:56

## 2016-07-28 ENCOUNTER — Encounter: Payer: Medicare Other | Attending: Surgery | Admitting: Surgery

## 2016-07-28 DIAGNOSIS — C44722 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, including hip: Secondary | ICD-10-CM | POA: Insufficient documentation

## 2016-07-28 DIAGNOSIS — L97312 Non-pressure chronic ulcer of right ankle with fat layer exposed: Secondary | ICD-10-CM | POA: Diagnosis not present

## 2016-07-28 DIAGNOSIS — I10 Essential (primary) hypertension: Secondary | ICD-10-CM | POA: Diagnosis not present

## 2016-07-28 DIAGNOSIS — J45909 Unspecified asthma, uncomplicated: Secondary | ICD-10-CM | POA: Diagnosis not present

## 2016-07-28 DIAGNOSIS — R011 Cardiac murmur, unspecified: Secondary | ICD-10-CM | POA: Insufficient documentation

## 2016-07-28 DIAGNOSIS — S81811A Laceration without foreign body, right lower leg, initial encounter: Secondary | ICD-10-CM | POA: Diagnosis not present

## 2016-07-28 DIAGNOSIS — I351 Nonrheumatic aortic (valve) insufficiency: Secondary | ICD-10-CM | POA: Diagnosis not present

## 2016-07-28 DIAGNOSIS — C44729 Squamous cell carcinoma of skin of left lower limb, including hip: Secondary | ICD-10-CM | POA: Insufficient documentation

## 2016-07-28 DIAGNOSIS — X58XXXA Exposure to other specified factors, initial encounter: Secondary | ICD-10-CM | POA: Insufficient documentation

## 2016-07-28 DIAGNOSIS — F419 Anxiety disorder, unspecified: Secondary | ICD-10-CM | POA: Insufficient documentation

## 2016-07-28 DIAGNOSIS — I739 Peripheral vascular disease, unspecified: Secondary | ICD-10-CM | POA: Diagnosis not present

## 2016-07-29 NOTE — Progress Notes (Signed)
BYRDIE, VOEGELE ([id number]) Visit Report for 07/28/2016 Arrival Information Details Patient Name: LOTTI, ANDLER Date of Service: 07/28/2016 2:30 PM Medical Record Number: [id number] Patient Account Number: [id number] Date of Birth/Sex: 1929/04/27 (83 y.o. Female) Treating RN: Ahmed Prima Primary Care Sakai Heinle: Clayborn Bigness Other Clinician: Referring Karesha Trzcinski: Clayborn Bigness Treating Peyten Punches/Extender: Frann Rider in Treatment: 12 Visit Information History Since Last Visit All ordered tests and consults were completed: No Patient Arrived: Ambulatory Added or deleted any medications: No Arrival Time: 14:36 Any new allergies or adverse reactions: No Accompanied By: daughter Had a fall or experienced change in No Transfer Assistance: None activities of daily living that may affect Patient Identification Verified: Yes risk of falls: Secondary Verification Process Yes Signs or symptoms of abuse/neglect since last No Completed: visito Patient Requires Transmission-Based No Hospitalized since last visit: No Precautions: Has Dressing in Place as Prescribed: Yes Patient Has Alerts: No Pain Present Now: No Electronic Signature(s) Signed: 07/28/2016 4:10:42 PM By: Alric Quan Entered By: Alric Quan on 07/28/2016 14:36:33 Zeitler, [name] ([id number]) -------------------------------------------------------------------------------- Clinic Level of Care Assessment Details Patient Name: [name] Date of Service: 07/28/2016 2:30 PM Medical Record Number: [id number] Patient Account Number: [id number] Date of Birth/Sex: 04/18/1929 (83 y.o. Female) Treating RN: Carolyne Fiscal, Debi Primary Care Parley Pidcock: Clayborn Bigness Other Clinician: Referring Revere Maahs: Clayborn Bigness Treating Lary Eckardt/Extender: Frann Rider in Treatment: 12 Clinic Level of Care Assessment Items TOOL 4 Quantity Score X - Use when only an EandM is performed on FOLLOW-UP visit 1 0 ASSESSMENTS -  Nursing Assessment / Reassessment X - Reassessment of Co-morbidities (includes updates in patient status) 1 10 X - Reassessment of Adherence to Treatment Plan 1 5 ASSESSMENTS - Wound and Skin Assessment / Reassessment []  - Simple Wound Assessment / Reassessment - one wound 0 X - Complex Wound Assessment / Reassessment - multiple wounds 2 5 []  - Dermatologic / Skin Assessment (not related to wound area) 0 ASSESSMENTS - Focused Assessment []  - Circumferential Edema Measurements - multi extremities 0 []  - Nutritional Assessment / Counseling / Intervention 0 []  - Lower Extremity Assessment (monofilament, tuning fork, pulses) 0 []  - Peripheral Arterial Disease Assessment (using hand held doppler) 0 ASSESSMENTS - Ostomy and/or Continence Assessment and Care []  - Incontinence Assessment and Management 0 []  - Ostomy Care Assessment and Management (repouching, etc.) 0 PROCESS - Coordination of Care X - Simple Patient / Family Education for ongoing care 1 15 []  - Complex (extensive) Patient / Family Education for ongoing care 0 []  - Staff obtains Programmer, systems, Records, Test Results / Process Orders 0 []  - Staff telephones HHA, Nursing Homes / Clarify orders / etc 0 []  - Routine Transfer to another Facility (non-emergent condition) 0 Groleau, Saffron ([id number]) []  - Routine Hospital Admission (non-emergent condition) 0 []  - New Admissions / Biomedical engineer / Ordering NPWT, Apligraf, etc. 0 []  - Emergency Hospital Admission (emergent condition) 0 X - Simple Discharge Coordination 1 10 []  - Complex (extensive) Discharge Coordination 0 PROCESS - Special Needs []  - Pediatric / Minor Patient Management 0 []  - Isolation Patient Management 0 []  - Hearing / Language / Visual special needs 0 []  - Assessment of Community assistance (transportation, D/C planning, etc.) 0 []  - Additional assistance / Altered mentation 0 []  - Support Surface(s) Assessment (bed, cushion, seat, etc.) 0 INTERVENTIONS  - Wound Cleansing / Measurement []  - Simple Wound Cleansing - one wound 0 X - Complex Wound Cleansing - multiple wounds 2 5 X - Wound Imaging (photographs - any number of wounds) 1  5 []  - Wound Tracing (instead of photographs) 0 []  - Simple Wound Measurement - one wound 0 X - Complex Wound Measurement - multiple wounds 2 5 INTERVENTIONS - Wound Dressings []  - Small Wound Dressing one or multiple wounds 0 X - Medium Wound Dressing one or multiple wounds 1 15 []  - Large Wound Dressing one or multiple wounds 0 X - Application of Medications - topical 1 5 []  - Application of Medications - injection 0 INTERVENTIONS - Miscellaneous []  - External ear exam 0 Klepper, Carel ([id number]) []  - Specimen Collection (cultures, biopsies, blood, body fluids, etc.) 0 []  - Specimen(s) / Culture(s) sent or taken to Lab for analysis 0 []  - Patient Transfer (multiple staff / Harrel Lemon Lift / Similar devices) 0 []  - Simple Staple / Suture removal (25 or less) 0 []  - Complex Staple / Suture removal (26 or more) 0 []  - Hypo / Hyperglycemic Management (close monitor of Blood Glucose) 0 []  - Ankle / Brachial Index (ABI) - do not check if billed separately 0 X - Vital Signs 1 5 Has the patient been seen at the hospital within the last three years: Yes Total Score: 100 Level Of Care: New/Established - Level 3 Electronic Signature(s) Signed: 07/28/2016 4:10:42 PM By: Alric Quan Entered By: Alric Quan on 07/28/2016 [place], [name] ([id number]) -------------------------------------------------------------------------------- Encounter Discharge Information Details Patient Name: [name] Date of Service: 07/28/2016 2:30 PM Medical Record Number: [id number] Patient Account Number: [id number] Date of Birth/Sex: 1929-05-20 (83 y.o. Female) Treating RN: Carolyne Fiscal, Debi Primary Care Sanjay Broadfoot: Clayborn Bigness Other Clinician: Referring Kery Haltiwanger: Clayborn Bigness Treating Diem Pagnotta/Extender: Frann Rider in Treatment: 12 Encounter Discharge Information Items Discharge Pain Level: 0 Discharge Condition: Stable Ambulatory Status: Ambulatory Discharge Destination: Home Private Transportation: Auto Accompanied By: daughter Schedule Follow-up Appointment: Yes Medication Reconciliation completed and No provided to Patient/Care Camdyn Beske: Clinical Summary of Care: Electronic Signature(s) Signed: 07/28/2016 4:10:42 PM By: Alric Quan Entered By: Alric Quan on 07/28/2016 14:51:24 Cupps, [name] ([id number]) -------------------------------------------------------------------------------- Lower Extremity Assessment Details Patient Name: [name] Date of Service: 07/28/2016 2:30 PM Medical Record Number: [id number] Patient Account Number: [id number] Date of Birth/Sex: [birth year] (83 y.o. Female) Treating RN: Carolyne Fiscal, Debi Primary Care Endiya Klahr: Clayborn Bigness Other Clinician: Referring Elen Acero: Clayborn Bigness Treating Ayeshia Coppin/Extender: Frann Rider in Treatment: 12 Vascular Assessment Pulses: Dorsalis Pedis Palpable: [Right:Yes] Posterior Tibial Extremity colors, hair growth, and conditions: Extremity Color: [Right:Mottled] Temperature of Extremity: [Right:Warm] Capillary Refill: [Right:< 3 seconds] Electronic Signature(s) Signed: 07/28/2016 4:10:42 PM By: Alric Quan Entered By: Alric Quan on 07/28/2016 14:50:39 Bradner, Cariann ([id number]) -------------------------------------------------------------------------------- Multi Wound Chart Details Patient Name: [name] Date of Service: 07/28/2016 2:30 PM Medical Record Number: [id number] Patient Account Number: [id number] Date of Birth/Sex: 06/06/1928 (83 y.o. Female) Treating RN: Ahmed Prima Primary Care Ryenne Lynam: Clayborn Bigness Other Clinician: Referring Hardie Veltre: Clayborn Bigness Treating Jadian Karman/Extender: Frann Rider in Treatment: 12 Vital Signs Height(in):  60 Pulse(bpm): 78 Weight(lbs): 122 Blood Pressure 193/78 (mmHg): Body Mass Index(BMI): 24 Temperature(F): Respiratory Rate 18 (breaths/min): Photos: [4:No Photos] [5:No Photos] [N/A:N/A] Wound Location: [4:Right Lower Leg - Anterior, Proximal] [5:Right Lower Leg - Distal] [N/A:N/A] Wounding Event: [4:Trauma] [5:Trauma] [N/A:N/A] Primary Etiology: [4:Trauma, Other] [5:Trauma, Other] [N/A:N/A] Comorbid History: [4:Asthma, Hypertension] [5:Asthma, Hypertension] [N/A:N/A] Date Acquired: [4:04/21/2016] [5:04/21/2016] [N/A:N/A] Weeks of Treatment: [4:10] [5:1] [N/A:N/A] Wound Status: [4:Open] [5:Open] [N/A:N/A] Measurements L x W x D 2.2x0.5x1.4 [5:1.6x0.8x0.7] [N/A:N/A] (cm) Area (cm) : [4:0.864] [5:1.005] [N/A:N/A] Volume (cm) : [4:1.21] [id number] [N/A:N/A] % Reduction in Area: [4:97.00%] [5:69.50%] [N/A:N/A] % Reduction  in Volume: 97.50% [5:78.70%] [N/A:N/A] Position 1 (o'clock): 6 [5:6] Maximum Distance 1 1.7 [5:1] (cm): Tunneling: [4:Yes] [5:Yes] [N/A:N/A] Classification: [4:Full Thickness Without Exposed Support Structures] [5:Full Thickness Without Exposed Support Structures] [N/A:N/A] Exudate Amount: [4:Large] [5:Large] [N/A:N/A] Exudate Type: [4:Serous] [5:Serous] [N/A:N/A] Exudate Color: [4:amber] [5:amber] [N/A:N/A] Wound Margin: [4:Flat and Intact] [5:Flat and Intact] [N/A:N/A] Granulation Amount: [4:Large (67-100%)] [5:Large (67-100%)] [N/A:N/A] Granulation Quality: [4:Red] [5:Red, Pink] [N/A:N/A] Necrotic Amount: [4:Small (1-33%)] [5:Small (1-33%)] [N/A:N/A] Necrotic Tissue: Eschar, Adherent Slough Adherent Slough N/A Exposed Structures: Fascia: No Fascia: No N/A Fat Layer (Subcutaneous Fat Layer (Subcutaneous Tissue) Exposed: No Tissue) Exposed: No Tendon: No Tendon: No Muscle: No Muscle: No Joint: No Joint: No Bone: No Bone: No Limited to Skin Limited to Skin Breakdown Breakdown Epithelialization: Small (1-33%) Small (1-33%) N/A Periwound Skin  Texture: Excoriation: No Excoriation: No N/A Induration: No Induration: No Callus: No Callus: No Crepitus: No Crepitus: No Rash: No Rash: No Scarring: No Scarring: No Periwound Skin Maceration: No Maceration: No N/A Moisture: Dry/Scaly: No Dry/Scaly: No Periwound Skin Color: Erythema: Yes Atrophie Blanche: No N/A Atrophie Blanche: No Cyanosis: No Cyanosis: No Ecchymosis: No Ecchymosis: No Erythema: No Hemosiderin Staining: No Hemosiderin Staining: No Mottled: No Mottled: No Pallor: No Pallor: No Rubor: No Rubor: No Erythema Location: Circumferential N/A N/A Tenderness on Yes Yes N/A Palpation: Wound Preparation: Ulcer Cleansing: Ulcer Cleansing: N/A Rinsed/Irrigated with Rinsed/Irrigated with Saline Saline Topical Anesthetic Topical Anesthetic Applied: Other: lidocaine Applied: Other: lidocaine 4% 4% Treatment Notes Wound #4 (Right, Proximal, Anterior Lower Leg) 1. Cleansed with: Clean wound with Normal Saline 2. Anesthetic Topical Lidocaine 4% cream to wound bed prior to debridement 4. Dressing Applied: Iodosorb Ointment Plain packing gauze 5. Secondary Dressing Applied ABD Pad Jetter, Jaylon ([id number]) Dry Gauze Kerlix/Conform 7. Secured with Tape Wound #5 (Right, Distal Lower Leg) 1. Cleansed with: Clean wound with Normal Saline 2. Anesthetic Topical Lidocaine 4% cream to wound bed prior to debridement 4. Dressing Applied: Iodosorb Ointment Plain packing gauze 5. Secondary Dressing Applied ABD Pad Dry Gauze Kerlix/Conform 7. Secured with Recruitment consultant) Signed: 07/28/2016 3:15:17 PM By: Christin Fudge MD, FACS Entered By: Christin Fudge on 07/28/2016 15:15:17 FLORIAN, LITE ([id number]) -------------------------------------------------------------------------------- [place] Details Patient Name: [name] Date of Service: 07/28/2016 2:30 PM Medical Record Number: [id number] Patient Account  Number: [id number] Date of Birth/Sex: [birth year] (83 y.o. Female) Treating RN: Carolyne Fiscal, Debi Primary Care Montario Zilka: Clayborn Bigness Other Clinician: Referring Manuela Halbur: Clayborn Bigness Treating Kenzlee Fishburn/Extender: Frann Rider in Treatment: 12 Active Inactive ` Abuse / Safety / Falls / Self Care Management Nursing Diagnoses: Impaired physical mobility Potential for falls Goals: Patient will remain injury free Date Initiated: 05/04/2016 Target Resolution Date: 07/25/2016 Goal Status: Active Interventions: Assess fall risk on admission and as needed Notes: ` Orientation to the Wound Care Program Nursing Diagnoses: Knowledge deficit related to the wound healing center program Goals: Patient/caregiver will verbalize understanding of the [place] Date Initiated: 05/04/2016 Target Resolution Date: 07/25/2016 Goal Status: Active Interventions: Provide education on orientation to the wound center Notes: ` Pain, Acute or Chronic Nursing Diagnoses: Pain, acute or chronic: actual or potential TANZANIA, PRINDIVILLE ([id number]) Goals: Patient/caregiver will verbalize adequate pain control between visits Date Initiated: 05/04/2016 Target Resolution Date: 07/25/2016 Goal Status: Active Interventions: Complete pain assessment as per visit requirements Notes: ` Wound/Skin Impairment Nursing Diagnoses: Impaired tissue integrity Goals: Patient/caregiver will verbalize understanding of skin care regimen Date Initiated: 05/04/2016 Target Resolution Date: 07/25/2016 Goal Status: Active Ulcer/skin breakdown will have a volume  reduction of 30% by week 4 Date Initiated: 05/04/2016 Target Resolution Date: 07/25/2016 Goal Status: Active Ulcer/skin breakdown will have a volume reduction of 50% by week 8 Date Initiated: 05/04/2016 Target Resolution Date: 08/22/2016 Goal Status: Active Ulcer/skin breakdown will have a volume reduction of 80% by week 12 Date Initiated:  05/04/2016 Target Resolution Date: 08/22/2016 Goal Status: Active Ulcer/skin breakdown will heal within 14 weeks Date Initiated: 05/04/2016 Target Resolution Date: 09/05/2016 Goal Status: Active Interventions: Assess patient/caregiver ability to obtain necessary supplies Assess patient/caregiver ability to perform ulcer/skin care regimen upon admission and as needed Assess ulceration(s) every visit Notes: Electronic Signature(s) Signed: 07/28/2016 4:10:42 PM By: Alric Quan Entered By: Alric Quan on 07/28/2016 14:50:44 Zappone, [name] ([id number]) -------------------------------------------------------------------------------- Pain Assessment Details Patient Name: [name] Date of Service: 07/28/2016 2:30 PM Medical Record Number: [id number] Patient Account Number: [id number] Date of Birth/Sex: 10-15-1928 (83 y.o. Female) Treating RN: Ahmed Prima Primary Care Adrijana Haros: Clayborn Bigness Other Clinician: Referring Vern Guerette: Clayborn Bigness Treating Aasiyah Auerbach/Extender: Frann Rider in Treatment: 12 Active Problems Location of Pain Severity and Description of Pain Patient Has Paino No Site Locations With Dressing Change: No Pain Management and Medication Current Pain Management: Electronic Signature(s) Signed: 07/28/2016 4:10:42 PM By: Alric Quan Entered By: Alric Quan on 07/28/2016 14:36:40 Caetano, [name] ([id number]) -------------------------------------------------------------------------------- Patient/Caregiver Education Details Patient Name: [name] Date of Service: 07/28/2016 2:30 PM Medical Record Number: [id number] Patient Account Number: [id number] Date of Birth/Gender: 1929/03/18 (83 y.o. Female) Treating RN: Ahmed Prima Primary Care Physician: Clayborn Bigness Other Clinician: Referring Physician: Clayborn Bigness Treating Physician/Extender: Frann Rider in Treatment: 12 Education Assessment Education Provided  To: Patient Education Topics Provided Wound/Skin Impairment: Handouts: Other: change dressing as ordered Methods: Demonstration, Explain/Verbal Responses: State content correctly Electronic Signature(s) Signed: 07/28/2016 4:10:42 PM By: Alric Quan Entered By: Alric Quan on 07/28/2016 14:51:43 Cavagnaro, Lucila ([id number]) -------------------------------------------------------------------------------- Wound Assessment Details Patient Name: [name] Date of Service: 07/28/2016 2:30 PM Medical Record Number: [id number] Patient Account Number: [id number] Date of Birth/Sex: Oct 14, 1928 (83 y.o. Female) Treating RN: Carolyne Fiscal, Debi Primary Care Neng Albee: Clayborn Bigness Other Clinician: Referring Adylin Hankey: Clayborn Bigness Treating Artem Bunte/Extender: Frann Rider in Treatment: 12 Wound Status Wound Number: 4 Primary Etiology: Trauma, Other Wound Location: Right Lower Leg - Anterior, Wound Status: Open Proximal Comorbid History: Asthma, Hypertension Wounding Event: Trauma Date Acquired: 04/21/2016 Weeks Of Treatment: 10 Clustered Wound: No Photos Photo Uploaded By: Alric Quan on 07/28/2016 15:51:34 Wound Measurements Length: (cm) 2.2 Width: (cm) 0.5 Depth: (cm) 1.4 Area: (cm) 0.864 Volume: (cm) 1.21 % Reduction in Area: 97% % Reduction in Volume: 97.5% Epithelialization: Small (1-33%) Tunneling: Yes Position (o'clock): 6 Maximum Distance: (cm) 1.7 Undermining: No Wound Description Full Thickness Without Exposed Classification: Support Structures Wound Margin: Flat and Intact Exudate Large Amount: Exudate Type: Serous Exudate Color: amber Dacey, Aria ([id number]) Foul Odor After Cleansing: No Slough/Fibrino No Wound Bed Granulation Amount: Large (67-100%) Exposed Structure Granulation Quality: Red Fascia Exposed: No Necrotic Amount: Small (1-33%) Fat Layer (Subcutaneous Tissue) Exposed: No Necrotic Quality: Eschar, Adherent  Slough Tendon Exposed: No Muscle Exposed: No Joint Exposed: No Bone Exposed: No Limited to Skin Breakdown Periwound Skin Texture Texture Color No Abnormalities Noted: No No Abnormalities Noted: No Callus: No Atrophie Blanche: No Crepitus: No Cyanosis: No Excoriation: No Ecchymosis: No Induration: No Erythema: Yes Rash: No Erythema Location: Circumferential Scarring: No Hemosiderin Staining: No Mottled: No Moisture Pallor: No No Abnormalities Noted: No Rubor: No Dry / Scaly: No Maceration: No Temperature / Pain Tenderness on Palpation: Yes Wound  Preparation Ulcer Cleansing: Rinsed/Irrigated with Saline Topical Anesthetic Applied: Other: lidocaine 4%, Treatment Notes Wound #4 (Right, Proximal, Anterior Lower Leg) 1. Cleansed with: Clean wound with Normal Saline 2. Anesthetic Topical Lidocaine 4% cream to wound bed prior to debridement 4. Dressing Applied: Iodosorb Ointment Plain packing gauze 5. Secondary Dressing Applied ABD Pad Dry Gauze Kerlix/Conform 7. Secured with Recruitment consultant) Signed: 07/28/2016 4:10:42 PM By: Lance Sell, [name] ([id number]) Entered By: Alric Quan on 07/28/2016 14:48:46 [place], [name] ([id number]) -------------------------------------------------------------------------------- Wound Assessment Details Patient Name: [name] Date of Service: 07/28/2016 2:30 PM Medical Record Number: [id number] Patient Account Number: [id number] Date of Birth/Sex: 1929-01-07 (83 y.o. Female) Treating RN: Carolyne Fiscal, Debi Primary Care Caroll Weinheimer: Clayborn Bigness Other Clinician: Referring Lindzey Zent: Clayborn Bigness Treating Tempestt Silba/Extender: Frann Rider in Treatment: 12 Wound Status Wound Number: 5 Primary Etiology: Trauma, Other Wound Location: Right Lower Leg - Distal Wound Status: Open Wounding Event: Trauma Comorbid History: Asthma, Hypertension Date Acquired: 04/21/2016 Weeks Of Treatment:  1 Clustered Wound: No Photos Photo Uploaded By: Alric Quan on 07/28/2016 15:51:34 Wound Measurements Length: (cm) 1.6 Width: (cm) 0.8 Depth: (cm) 0.7 Area: (cm) 1.005 Volume: (cm) 0.704 % Reduction in Area: 69.5% % Reduction in Volume: 78.7% Epithelialization: Small (1-33%) Tunneling: Yes Position (o'clock): 6 Maximum Distance: (cm) 1 Undermining: No Wound Description Full Thickness Without Exposed Classification: Support Structures Wound Margin: Flat and Intact Exudate Large Amount: Exudate Type: Serous Exudate Color: amber Foul Odor After Cleansing: No Slough/Fibrino No Wound Bed Molesworth, Laurene ([id number]) Granulation Amount: Large (67-100%) Exposed Structure Granulation Quality: Red, Pink Fascia Exposed: No Necrotic Amount: Small (1-33%) Fat Layer (Subcutaneous Tissue) Exposed: No Necrotic Quality: Adherent Slough Tendon Exposed: No Muscle Exposed: No Joint Exposed: No Bone Exposed: No Limited to Skin Breakdown Periwound Skin Texture Texture Color No Abnormalities Noted: No No Abnormalities Noted: No Callus: No Atrophie Blanche: No Crepitus: No Cyanosis: No Excoriation: No Ecchymosis: No Induration: No Erythema: No Rash: No Hemosiderin Staining: No Scarring: No Mottled: No Pallor: No Moisture Rubor: No No Abnormalities Noted: No Dry / Scaly: No Temperature / Pain Maceration: No Tenderness on Palpation: Yes Wound Preparation Ulcer Cleansing: Rinsed/Irrigated with Saline Topical Anesthetic Applied: Other: lidocaine 4%, Treatment Notes Wound #5 (Right, Distal Lower Leg) 1. Cleansed with: Clean wound with Normal Saline 2. Anesthetic Topical Lidocaine 4% cream to wound bed prior to debridement 4. Dressing Applied: Iodosorb Ointment Plain packing gauze 5. Secondary Dressing Applied ABD Pad Dry Gauze Kerlix/Conform 7. Secured with Recruitment consultant) Signed: 07/28/2016 4:10:42 PM By: Alric Quan Entered By:  Alric Quan on 07/28/2016 14:49:25 Monje, Mckynlee ([id number]) DEDRICK, KOTAS ([id number]) -------------------------------------------------------------------------------- [place] Details Patient Name: [name] Date of Service: 07/28/2016 2:30 PM Medical Record Number: [id number] Patient Account Number: [id number] Date of Birth/Sex: Nov 29, 1928 (82 y.o. Female) Treating RN: Carolyne Fiscal, Debi Primary Care Debbie Yearick: Clayborn Bigness Other Clinician: Referring Ryker Sudbury: Clayborn Bigness Treating Merly Hinkson/Extender: Frann Rider in Treatment: 12 Vital Signs Time Taken: 14:36 Pulse (bpm): 78 Height (in): 60 Respiratory Rate (breaths/min): 18 Weight (lbs): 122 Blood Pressure (mmHg): 193/78 Body Mass Index (BMI): 23.8 Reference Range: 80 - 120 mg / dl Electronic Signature(s) Signed: 07/28/2016 4:10:42 PM By: Alric Quan Entered By: Alric Quan on 07/28/2016 14:45:19
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
## 2016-08-02 DIAGNOSIS — C44729 Squamous cell carcinoma of skin of left lower limb, including hip: Secondary | ICD-10-CM | POA: Insufficient documentation

## 2016-08-04 ENCOUNTER — Encounter: Payer: Medicare Other | Admitting: Surgery

## 2016-08-04 DIAGNOSIS — C44722 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, including hip: Secondary | ICD-10-CM | POA: Diagnosis not present
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
## 2016-08-05 NOTE — Progress Notes (Signed)
[name], [name] ([id number]) Visit Report for 08/04/2016 Arrival Information Details Patient Name: LEILY, CAPEK Date of Service: 08/04/2016 11:15 AM Medical Record Number: [id number] Patient Account Number: [id number] Date of Birth/Sex: [birth year] (83 y.o. Female) Treating RN: Montey Hora Primary Care Apollos Tenbrink: Clayborn Bigness Other Clinician: Referring Nadezhda Pollitt: Clayborn Bigness Treating Axxel Gude/Extender: Frann Rider in Treatment: 13 Visit Information History Since Last Visit Added or deleted any medications: No Patient Arrived: Ambulatory Any new allergies or adverse reactions: No Arrival Time: 11:19 Had a fall or experienced change in No Accompanied By: dtr activities of daily living that may affect Transfer Assistance: None risk of falls: Patient Identification Verified: Yes Signs or symptoms of abuse/neglect since last No Secondary Verification Process Yes visito Completed: Hospitalized since last visit: No Patient Requires Transmission-Based No Has Dressing in Place as Prescribed: Yes Precautions: Pain Present Now: Yes Patient Has Alerts: No Electronic Signature(s) Signed: 08/04/2016 5:14:48 PM By: Montey Hora Entered By: Montey Hora on 08/04/2016 11:20:12 [name] ([id number]) -------------------------------------------------------------------------------- Clinic Level of Care Assessment Details Patient Name: [name] Date of Service: 08/04/2016 11:15 AM Medical Record Number: [id number] Patient Account Number: [id number] Date of Birth/Sex: [birth year] (83 y.o. Female) Treating RN: Montey Hora Primary Care Aqib Lough: Clayborn Bigness Other Clinician: Referring Deosha Werden: Clayborn Bigness Treating Aristidis Talerico/Extender: Frann Rider in Treatment: 13 Clinic Level of Care Assessment Items TOOL 4 Quantity Score []  - Use when only an EandM is performed on FOLLOW-UP visit 0 ASSESSMENTS - Nursing Assessment / Reassessment X - Reassessment of  Co-morbidities (includes updates in patient status) 1 10 X - Reassessment of Adherence to Treatment Plan 1 5 ASSESSMENTS - Wound and Skin Assessment / Reassessment []  - Simple Wound Assessment / Reassessment - one wound 0 X - Complex Wound Assessment / Reassessment - multiple wounds 2 5 []  - Dermatologic / Skin Assessment (not related to wound area) 0 ASSESSMENTS - Focused Assessment []  - Circumferential Edema Measurements - multi extremities 0 []  - Nutritional Assessment / Counseling / Intervention 0 X - Lower Extremity Assessment (monofilament, tuning fork, pulses) 1 5 []  - Peripheral Arterial Disease Assessment (using hand held doppler) 0 ASSESSMENTS - Ostomy and/or Continence Assessment and Care []  - Incontinence Assessment and Management 0 []  - Ostomy Care Assessment and Management (repouching, etc.) 0 PROCESS - Coordination of Care X - Simple Patient / Family Education for ongoing care 1 15 []  - Complex (extensive) Patient / Family Education for ongoing care 0 []  - Staff obtains Programmer, systems, Records, Test Results / Process Orders 0 []  - Staff telephones HHA, Nursing Homes / Clarify orders / etc 0 []  - Routine Transfer to another Facility (non-emergent condition) 0 [name], [name] ([id number]) []  - Routine Hospital Admission (non-emergent condition) 0 []  - New Admissions / Biomedical engineer / Ordering NPWT, Apligraf, etc. 0 []  - Emergency Hospital Admission (emergent condition) 0 X - Simple Discharge Coordination 1 10 []  - Complex (extensive) Discharge Coordination 0 PROCESS - Special Needs []  - Pediatric / Minor Patient Management 0 []  - Isolation Patient Management 0 []  - Hearing / Language / Visual special needs 0 []  - Assessment of Community assistance (transportation, D/C planning, etc.) 0 []  - Additional assistance / Altered mentation 0 []  - Support Surface(s) Assessment (bed, cushion, seat, etc.) 0 INTERVENTIONS - Wound Cleansing / Measurement []  - Simple Wound  Cleansing - one wound 0 X - Complex Wound Cleansing - multiple wounds 2 5 X - Wound Imaging (photographs - any number of wounds) 1 5 []  - Wound Tracing (instead of photographs)  0 []  - Simple Wound Measurement - one wound 0 X - Complex Wound Measurement - multiple wounds 2 5 INTERVENTIONS - Wound Dressings X - Small Wound Dressing one or multiple wounds 2 10 []  - Medium Wound Dressing one or multiple wounds 0 []  - Large Wound Dressing one or multiple wounds 0 []  - Application of Medications - topical 0 []  - Application of Medications - injection 0 INTERVENTIONS - Miscellaneous []  - External ear exam 0 [name], [name] ([id number]) []  - Specimen Collection (cultures, biopsies, blood, body fluids, etc.) 0 []  - Specimen(s) / Culture(s) sent or taken to Lab for analysis 0 []  - Patient Transfer (multiple staff / Harrel Lemon Lift / Similar devices) 0 []  - Simple Staple / Suture removal (25 or less) 0 []  - Complex Staple / Suture removal (26 or more) 0 []  - Hypo / Hyperglycemic Management (close monitor of Blood Glucose) 0 []  - Ankle / Brachial Index (ABI) - do not check if billed separately 0 X - Vital Signs 1 5 Has the patient been seen at the hospital within the last three years: Yes Total Score: 105 Level Of Care: New/Established - Level 3 Electronic Signature(s) Signed: 08/04/2016 5:14:48 PM By: Montey Hora Entered By: Montey Hora on 08/04/2016 13:01:09 [name] ([id number]) -------------------------------------------------------------------------------- Encounter Discharge Information Details Patient Name: [name] Date of Service: 08/04/2016 11:15 AM Medical Record Number: [id number] Patient Account Number: [id number] Date of Birth/Sex: 21-May-1929 (83 y.o. Female) Treating RN: Montey Hora Primary Care Marven Veley: Clayborn Bigness Other Clinician: Referring Rechy Bost: Clayborn Bigness Treating Christropher Gintz/Extender: Frann Rider in Treatment: 56 Encounter Discharge  Information Items Discharge Pain Level: 0 Discharge Condition: Stable Ambulatory Status: Ambulatory Discharge Destination: Home Transportation: Private Auto Accompanied By: dtr Schedule Follow-up Appointment: Yes Medication Reconciliation completed No and provided to Patient/Care Adamary Savary: Patient Clinical Summary of Care: Declined Electronic Signature(s) Signed: 08/04/2016 1:30:51 PM By: Ruthine Dose Entered By: Ruthine Dose on 08/04/2016 13:30:51 [name] ([id number]) -------------------------------------------------------------------------------- Lower Extremity Assessment Details Patient Name: [name] Date of Service: 08/04/2016 11:15 AM Medical Record Number: [id number] Patient Account Number: [id number] Date of Birth/Sex: 04/27/1929 (83 y.o. Female) Treating RN: Montey Hora Primary Care Joyel Chenette: Clayborn Bigness Other Clinician: Referring Duong Haydel: Clayborn Bigness Treating Margart Zemanek/Extender: Frann Rider in Treatment: 13 Vascular Assessment Pulses: Dorsalis Pedis Palpable: [Right:Yes] Posterior Tibial Extremity colors, hair growth, and conditions: Extremity Color: [Right:Mottled] Hair Growth on Extremity: [Right:No] Temperature of Extremity: [Right:Warm] Capillary Refill: [Right:< 3 seconds] Electronic Signature(s) Signed: 08/04/2016 5:14:48 PM By: Montey Hora Entered By: Montey Hora on 08/04/2016 11:33:02 [name] ([id number]) -------------------------------------------------------------------------------- Multi Wound Chart Details Patient Name: [name] Date of Service: 08/04/2016 11:15 AM Medical Record Number: [id number] Patient Account Number: [id number] Date of Birth/Sex: May 11, 1929 (83 y.o. Female) Treating RN: Montey Hora Primary Care Joscelyne Renville: Clayborn Bigness Other Clinician: Referring Arturo Sofranko: Clayborn Bigness Treating Ormand Senn/Extender: Frann Rider in Treatment: 13 Vital Signs Height(in): 60 Pulse(bpm):  73 Weight(lbs): 122 Blood Pressure 183/67 (mmHg): Body Mass Index(BMI): 24 Temperature(F): 98.4 Respiratory Rate 18 (breaths/min): Photos: [4:No Photos] [5:No Photos] [N/A:N/A] Wound Location: [4:Right Lower Leg - Anterior, Proximal] [5:Right Lower Leg - Distal] [N/A:N/A] Wounding Event: [4:Trauma] [5:Trauma] [N/A:N/A] Primary Etiology: [4:Trauma, Other] [5:Trauma, Other] [N/A:N/A] Comorbid History: [4:Asthma, Hypertension] [5:Asthma, Hypertension] [N/A:N/A] Date Acquired: [4:04/21/2016] [5:04/21/2016] [N/A:N/A] Weeks of Treatment: [4:11] [5:2] [N/A:N/A] Wound Status: [4:Open] [5:Open] [N/A:N/A] Measurements L x W x D 2x0.6x1.4 [5:1.4x0.8x0.8] [N/A:N/A] (cm) Area (cm) : [4:0.942] [5:0.88] [N/A:N/A] Volume (cm) : [4:1.319] [7:4.259] [N/A:N/A] % Reduction in Area: [4:96.70%] [5:73.30%] [N/A:N/A] % Reduction in  Volume: 97.30% [5:78.70%] [N/A:N/A] Classification: [4:Full Thickness Without Exposed Support Structures] [5:Full Thickness Without Exposed Support Structures] [N/A:N/A] Exudate Amount: [4:Large] [5:Large] [N/A:N/A] Exudate Type: [4:Serous] [5:Serous] [N/A:N/A] Exudate Color: [4:amber] [5:amber] [N/A:N/A] Wound Margin: [4:Flat and Intact] [5:Flat and Intact] [N/A:N/A] Granulation Amount: [4:Large (67-100%)] [5:Large (67-100%)] [N/A:N/A] Granulation Quality: [4:Red] [5:Red, Pink] [N/A:N/A] Necrotic Amount: [4:Small (1-33%)] [5:Small (1-33%)] [N/A:N/A] Necrotic Tissue: [4:Eschar, Adherent Slough] [5:Adherent Slough] [N/A:N/A] Exposed Structures: [4:Fascia: No Fat Layer (Subcutaneous Tissue) Exposed: No Tendon: No] [5:Fascia: No Fat Layer (Subcutaneous Tissue) Exposed: No Tendon: No] [N/A:N/A] Muscle: No Muscle: No Joint: No Joint: No Bone: No Bone: No Limited to Skin Limited to Skin Breakdown Breakdown Epithelialization: Small (1-33%) Small (1-33%) N/A Periwound Skin Texture: Excoriation: No Excoriation: No N/A Induration: No Induration: No Callus: No Callus:  No Crepitus: No Crepitus: No Rash: No Rash: No Scarring: No Scarring: No Periwound Skin Maceration: No Maceration: No N/A Moisture: Dry/Scaly: No Dry/Scaly: No Periwound Skin Color: Erythema: Yes Atrophie Blanche: No N/A Atrophie Blanche: No Cyanosis: No Cyanosis: No Ecchymosis: No Ecchymosis: No Erythema: No Hemosiderin Staining: No Hemosiderin Staining: No Mottled: No Mottled: No Pallor: No Pallor: No Rubor: No Rubor: No Erythema Location: Circumferential N/A N/A Tenderness on Yes Yes N/A Palpation: Wound Preparation: Ulcer Cleansing: Ulcer Cleansing: N/A Rinsed/Irrigated with Rinsed/Irrigated with Saline Saline Topical Anesthetic Topical Anesthetic Applied: Other: lidocaine Applied: Other: lidocaine 4% 4% Treatment Notes Electronic Signature(s) Signed: 08/04/2016 11:48:28 AM By: Christin Fudge MD, FACS Entered By: Christin Fudge on 08/04/2016 11:48:28 [name], [name] ([id number]) -------------------------------------------------------------------------------- [place] Details Patient Name: [name] Date of Service: 08/04/2016 11:15 AM Medical Record Number: [id number] Patient Account Number: [id number] Date of Birth/Sex: Oct 30, 1928 (83 y.o. Female) Treating RN: Montey Hora Primary Care Ermine Spofford: Clayborn Bigness Other Clinician: Referring Siani Utke: Clayborn Bigness Treating Jasraj Lappe/Extender: Frann Rider in Treatment: 104 Active Inactive ` Abuse / Safety / Falls / Self Care Management Nursing Diagnoses: Impaired physical mobility Potential for falls Goals: Patient will remain injury free Date Initiated: 05/04/2016 Target Resolution Date: 07/25/2016 Goal Status: Active Interventions: Assess fall risk on admission and as needed Notes: ` Orientation to the Wound Care Program Nursing Diagnoses: Knowledge deficit related to the wound healing center program Goals: Patient/caregiver will verbalize understanding of the [place] Date Initiated: 05/04/2016 Target Resolution Date: 07/25/2016 Goal Status: Active Interventions: Provide education on orientation to the wound center Notes: ` Pain, Acute or Chronic Nursing Diagnoses: Pain, acute or chronic: actual or potential CHARLENA, HAUB ([id number]) Goals: Patient/caregiver will verbalize adequate pain control between visits Date Initiated: 05/04/2016 Target Resolution Date: 07/25/2016 Goal Status: Active Interventions: Complete pain assessment as per visit requirements Notes: ` Wound/Skin Impairment Nursing Diagnoses: Impaired tissue integrity Goals: Patient/caregiver will verbalize understanding of skin care regimen Date Initiated: 05/04/2016 Target Resolution Date: 07/25/2016 Goal Status: Active Ulcer/skin breakdown will have a volume reduction of 30% by week 4 Date Initiated: 05/04/2016 Target Resolution Date: 07/25/2016 Goal Status: Active Ulcer/skin breakdown will have a volume reduction of 50% by week 8 Date Initiated: 05/04/2016 Target Resolution Date: 08/22/2016 Goal Status: Active Ulcer/skin breakdown will have a volume reduction of 80% by week 12 Date Initiated: 05/04/2016 Target Resolution Date: 08/22/2016 Goal Status: Active Ulcer/skin breakdown will heal within 14 weeks Date Initiated: 05/04/2016 Target Resolution Date: 09/05/2016 Goal Status: Active Interventions: Assess patient/caregiver ability to obtain necessary supplies Assess patient/caregiver ability to perform ulcer/skin care regimen upon admission and as needed Assess ulceration(s) every visit Notes: Electronic Signature(s) Signed: 08/04/2016 5:14:48 PM By: Montey Hora Entered By:  Montey Hora on 08/04/2016 11:33:10 [name], [name] ([id number]) -------------------------------------------------------------------------------- Pain Assessment Details Patient Name: [name], [name] Date of Service: 08/04/2016 11:15 AM Medical Record Number:  [id number] Patient Account Number: [id number] Date of Birth/Sex: 1928/10/08 (83 y.o. Female) Treating RN: Montey Hora Primary Care Alizaya Oshea: Clayborn Bigness Other Clinician: Referring Ramiz Turpin: Clayborn Bigness Treating Darrin Apodaca/Extender: Frann Rider in Treatment: 13 Active Problems Location of Pain Severity and Description of Pain Patient Has Paino Yes Site Locations Pain Location: Pain in Ulcers With Dressing Change: Yes Duration of the Pain. Constant / Intermittento Constant Pain Management and Medication Current Pain Management: Notes Topical or injectable lidocaine is offered to patient for acute pain when surgical debridement is performed. If needed, Patient is instructed to use over the counter pain medication for the following 24-48 hours after debridement. Wound care MDs do not prescribed pain medications. Patient has chronic pain or uncontrolled pain. Patient has been instructed to make an appointment with their Primary Care Physician for pain management. Electronic Signature(s) Signed: 08/04/2016 5:14:48 PM By: Montey Hora Entered By: Montey Hora on 08/04/2016 11:20:26 [name] ([id number]) -------------------------------------------------------------------------------- Patient/Caregiver Education Details Patient Name: [name] Date of Service: 08/04/2016 11:15 AM Medical Record Number: [id number] Patient Account Number: [id number] Date of Birth/Gender: 1929/02/06 (83 y.o. Female) Treating RN: Montey Hora Primary Care Physician: Clayborn Bigness Other Clinician: Referring Physician: Clayborn Bigness Treating Physician/Extender: Frann Rider in Treatment: 13 Education Assessment Education Provided To: Patient and Caregiver Education Topics Provided Wound/Skin Impairment: Handouts: Other: wound care as ordered Methods: Demonstration, Explain/Verbal Responses: State content correctly Electronic Signature(s) Signed: 08/04/2016 5:14:48 PM By:  Montey Hora Entered By: Montey Hora on 08/04/2016 11:39:47 [name], [name] ([id number]) -------------------------------------------------------------------------------- Wound Assessment Details Patient Name: [name] Date of Service: 08/04/2016 11:15 AM Medical Record Number: [id number] Patient Account Number: [id number] Date of Birth/Sex: [birth year] (83 y.o. Female) Treating RN: Montey Hora Primary Care Keyasia Jolliff: Clayborn Bigness Other Clinician: Referring Brinkley Peet: Clayborn Bigness Treating Tahari Clabaugh/Extender: Frann Rider in Treatment: 13 Wound Status Wound Number: 4 Primary Etiology: Trauma, Other Wound Location: Right Lower Leg - Anterior, Wound Status: Open Proximal Comorbid History: Asthma, Hypertension Wounding Event: Trauma Date Acquired: 04/21/2016 Weeks Of Treatment: 11 Clustered Wound: No Wound Measurements Length: (cm) 2 Width: (cm) 0.6 Depth: (cm) 1.4 Area: (cm) 0.942 Volume: (cm) 1.319 % Reduction in Area: 96.7% % Reduction in Volume: 97.3% Epithelialization: Small (1-33%) Tunneling: No Undermining: No Wound Description Full Thickness Without Exposed Foul Odor Aft Classification: Support Structures Slough/Fibrin Wound Margin: Flat and Intact Exudate Large Amount: Exudate Type: Serous Exudate Color: amber er Cleansing: No o No Wound Bed Granulation Amount: Large (67-100%) Exposed Structure Granulation Quality: Red Fascia Exposed: No Necrotic Amount: Small (1-33%) Fat Layer (Subcutaneous Tissue) Exposed: No Necrotic Quality: Eschar, Adherent Slough Tendon Exposed: No Muscle Exposed: No Joint Exposed: No Bone Exposed: No Limited to Skin Breakdown Periwound Skin Texture Texture Color No Abnormalities Noted: No No Abnormalities Noted: No Callus: No Atrophie Blanche: No [name], [name] ([id number]) Crepitus: No Cyanosis: No Excoriation: No Ecchymosis: No Induration: No Erythema: Yes Rash: No Erythema Location:  Circumferential Scarring: No Hemosiderin Staining: No Mottled: No Moisture Pallor: No No Abnormalities Noted: No Rubor: No Dry / Scaly: No Maceration: No Temperature / Pain Tenderness on Palpation: Yes Wound Preparation Ulcer Cleansing: Rinsed/Irrigated with Saline Topical Anesthetic Applied: Other: lidocaine 4%, Treatment Notes Wound #4 (Right, Proximal, Anterior Lower Leg) 1. Cleansed with: Clean wound with Normal Saline 2. Anesthetic Topical Lidocaine 4% cream to wound bed prior to debridement 4. Dressing Applied: Iodosorb Ointment  Plain packing gauze 5. Secondary Dressing Applied ABD Pad Kerlix/Conform Non-Adherent pad 7. Secured with Recruitment consultant) Signed: 08/04/2016 5:14:48 PM By: Montey Hora Entered By: Montey Hora on 08/04/2016 11:32:26 [name], [name] ([id number]) -------------------------------------------------------------------------------- Wound Assessment Details Patient Name: [name] Date of Service: 08/04/2016 11:15 AM Medical Record Number: [id number] Patient Account Number: [id number] Date of Birth/Sex: 1929-01-30 (83 y.o. Female) Treating RN: Montey Hora Primary Care Argie Applegate: Clayborn Bigness Other Clinician: Referring Rashawnda Gaba: Clayborn Bigness Treating Ma Munoz/Extender: Frann Rider in Treatment: 13 Wound Status Wound Number: 5 Primary Etiology: Trauma, Other Wound Location: Right Lower Leg - Distal Wound Status: Open Wounding Event: Trauma Comorbid History: Asthma, Hypertension Date Acquired: 04/21/2016 Weeks Of Treatment: 2 Clustered Wound: No Wound Measurements Length: (cm) 1.4 Width: (cm) 0.8 Depth: (cm) 0.8 Area: (cm) 0.88 Volume: (cm) 0.704 % Reduction in Area: 73.3% % Reduction in Volume: 78.7% Epithelialization: Small (1-33%) Tunneling: No Undermining: No Wound Description Full Thickness Without Exposed Foul Odor Aft Classification: Support Structures Slough/Fibrin Wound Margin: Flat  and Intact Exudate Large Amount: Exudate Type: Serous Exudate Color: amber er Cleansing: No o No Wound Bed Granulation Amount: Large (67-100%) Exposed Structure Granulation Quality: Red, Pink Fascia Exposed: No Necrotic Amount: Small (1-33%) Fat Layer (Subcutaneous Tissue) Exposed: No Necrotic Quality: Adherent Slough Tendon Exposed: No Muscle Exposed: No Joint Exposed: No Bone Exposed: No Limited to Skin Breakdown Periwound Skin Texture Texture Color No Abnormalities Noted: No No Abnormalities Noted: No Callus: No Atrophie Blanche: No Crepitus: No Cyanosis: No [name], [name] ([id number]) Excoriation: No Ecchymosis: No Induration: No Erythema: No Rash: No Hemosiderin Staining: No Scarring: No Mottled: No Pallor: No Moisture Rubor: No No Abnormalities Noted: No Dry / Scaly: No Temperature / Pain Maceration: No Tenderness on Palpation: Yes Wound Preparation Ulcer Cleansing: Rinsed/Irrigated with Saline Topical Anesthetic Applied: Other: lidocaine 4%, Treatment Notes Wound #5 (Right, Distal Lower Leg) 1. Cleansed with: Clean wound with Normal Saline 2. Anesthetic Topical Lidocaine 4% cream to wound bed prior to debridement 4. Dressing Applied: Iodosorb Ointment Plain packing gauze 5. Secondary Dressing Applied ABD Pad Kerlix/Conform Non-Adherent pad 7. Secured with Recruitment consultant) Signed: 08/04/2016 5:14:48 PM By: Montey Hora Entered By: Montey Hora on 08/04/2016 11:32:38 [name], [name] ([id number]) -------------------------------------------------------------------------------- [place] Details Patient Name: [name] Date of Service: 08/04/2016 11:15 AM Medical Record Number: [id number] Patient Account Number: [id number] Date of Birth/Sex: 1928-12-08 (84 y.o. Female) Treating RN: Montey Hora Primary Care Giankarlo Leamer: Clayborn Bigness Other Clinician: Referring Jeanene Mena: Clayborn Bigness Treating Marleny Faller/Extender: Frann Rider in Treatment: 13 Vital Signs Time Taken: 11:21 Temperature (F): 98.4 Height (in): 60 Pulse (bpm): 73 Weight (lbs): 122 Respiratory Rate (breaths/min): 18 Body Mass Index (BMI): 23.8 Blood Pressure (mmHg): 183/67 Reference Range: 80 - 120 mg / dl Electronic Signature(s) Signed: 08/04/2016 5:14:48 PM By: Montey Hora Entered By: Montey Hora on 08/04/2016 11:23:03

## 2016-08-10 ENCOUNTER — Encounter: Payer: Medicare Other | Admitting: Surgery

## 2016-08-10 DIAGNOSIS — C44722 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, including hip: Secondary | ICD-10-CM | POA: Diagnosis not present

## 2016-08-11 NOTE — Progress Notes (Signed)
TONI, DEMO ([id number]) Visit Report for 08/10/2016 Arrival Information Details Patient Name: [name], [name] Date of Service: 08/10/2016 1:30 PM Medical Record Number: [id number] Patient Account Number: [id number] Date of Birth/Sex: May 05, 1929 (83 y.o. Female) Treating RN: Montey Hora Primary Care Keasha Malkiewicz: Clayborn Bigness Other Clinician: Referring Avory Rahimi: Clayborn Bigness Treating Karlisha Mathena/Extender: Frann Rider in Treatment: 14 Visit Information History Since Last Visit Added or deleted any medications: No Patient Arrived: Ambulatory Any new allergies or adverse reactions: No Arrival Time: 13:42 Had a fall or experienced change in No Accompanied By: dtr activities of daily living that may affect Transfer Assistance: None risk of falls: Patient Identification Verified: Yes Signs or symptoms of abuse/neglect since last No Secondary Verification Process Yes visito Completed: Hospitalized since last visit: No Patient Requires Transmission-Based No Has Dressing in Place as Prescribed: Yes Precautions: Pain Present Now: Yes Patient Has Alerts: No Electronic Signature(s) Signed: 08/10/2016 5:23:06 PM By: Montey Hora Entered By: Montey Hora on 08/10/2016 13:43:13 [name], [name] ([id number]) -------------------------------------------------------------------------------- Clinic Level of Care Assessment Details Patient Name: [name] Date of Service: 08/10/2016 1:30 PM Medical Record Number: [id number] Patient Account Number: [id number] Date of Birth/Sex: 1929-04-18 (83 y.o. Female) Treating RN: Montey Hora Primary Care Devun Anna: Clayborn Bigness Other Clinician: Referring Olly Shiner: Clayborn Bigness Treating Rubby Barbary/Extender: Frann Rider in Treatment: 14 Clinic Level of Care Assessment Items TOOL 4 Quantity Score []  - Use when only an EandM is performed on FOLLOW-UP visit 0 ASSESSMENTS - Nursing Assessment / Reassessment X - Reassessment of  Co-morbidities (includes updates in patient status) 1 10 X - Reassessment of Adherence to Treatment Plan 1 5 ASSESSMENTS - Wound and Skin Assessment / Reassessment []  - Simple Wound Assessment / Reassessment - one wound 0 X - Complex Wound Assessment / Reassessment - multiple wounds 2 5 []  - Dermatologic / Skin Assessment (not related to wound area) 0 ASSESSMENTS - Focused Assessment []  - Circumferential Edema Measurements - multi extremities 0 []  - Nutritional Assessment / Counseling / Intervention 0 X - Lower Extremity Assessment (monofilament, tuning fork, pulses) 1 5 []  - Peripheral Arterial Disease Assessment (using hand held doppler) 0 ASSESSMENTS - Ostomy and/or Continence Assessment and Care []  - Incontinence Assessment and Management 0 []  - Ostomy Care Assessment and Management (repouching, etc.) 0 PROCESS - Coordination of Care X - Simple Patient / Family Education for ongoing care 1 15 []  - Complex (extensive) Patient / Family Education for ongoing care 0 []  - Staff obtains Programmer, systems, Records, Test Results / Process Orders 0 []  - Staff telephones HHA, Nursing Homes / Clarify orders / etc 0 []  - Routine Transfer to another Facility (non-emergent condition) 0 [name], [name] ([id number]) []  - Routine Hospital Admission (non-emergent condition) 0 []  - New Admissions / Biomedical engineer / Ordering NPWT, Apligraf, etc. 0 []  - Emergency Hospital Admission (emergent condition) 0 X - Simple Discharge Coordination 1 10 []  - Complex (extensive) Discharge Coordination 0 PROCESS - Special Needs []  - Pediatric / Minor Patient Management 0 []  - Isolation Patient Management 0 []  - Hearing / Language / Visual special needs 0 []  - Assessment of Community assistance (transportation, D/C planning, etc.) 0 []  - Additional assistance / Altered mentation 0 []  - Support Surface(s) Assessment (bed, cushion, seat, etc.) 0 INTERVENTIONS - Wound Cleansing / Measurement []  - Simple Wound  Cleansing - one wound 0 X - Complex Wound Cleansing - multiple wounds 2 5 X - Wound Imaging (photographs - any number of wounds) 1 5 []  - Wound Tracing (instead of photographs)  0 []  - Simple Wound Measurement - one wound 0 X - Complex Wound Measurement - multiple wounds 2 5 INTERVENTIONS - Wound Dressings X - Small Wound Dressing one or multiple wounds 2 10 []  - Medium Wound Dressing one or multiple wounds 0 []  - Large Wound Dressing one or multiple wounds 0 []  - Application of Medications - topical 0 []  - Application of Medications - injection 0 INTERVENTIONS - Miscellaneous []  - External ear exam 0 [name], [name] ([id number]) []  - Specimen Collection (cultures, biopsies, blood, body fluids, etc.) 0 []  - Specimen(s) / Culture(s) sent or taken to Lab for analysis 0 []  - Patient Transfer (multiple staff / Harrel Lemon Lift / Similar devices) 0 []  - Simple Staple / Suture removal (25 or less) 0 []  - Complex Staple / Suture removal (26 or more) 0 []  - Hypo / Hyperglycemic Management (close monitor of Blood Glucose) 0 []  - Ankle / Brachial Index (ABI) - do not check if billed separately 0 X - Vital Signs 1 5 Has the patient been seen at the hospital within the last three years: Yes Total Score: 105 Level Of Care: New/Established - Level 3 Electronic Signature(s) Signed: 08/10/2016 5:23:06 PM By: Montey Hora Entered By: Montey Hora on 08/10/2016 14:16:17 [name] ([id number]) -------------------------------------------------------------------------------- Encounter Discharge Information Details Patient Name: [name] Date of Service: 08/10/2016 1:30 PM Medical Record Number: [id number] Patient Account Number: [id number] Date of Birth/Sex: 26-Oct-1928 (83 y.o. Female) Treating RN: Montey Hora Primary Care Darell Saputo: Clayborn Bigness Other Clinician: Referring Renada Cronin: Clayborn Bigness Treating Joycelynn Fritsche/Extender: Frann Rider in Treatment: 64 Encounter Discharge  Information Items Discharge Pain Level: 0 Discharge Condition: Stable Ambulatory Status: Ambulatory Discharge Destination: Home Transportation: Private Auto Accompanied By: dtr Schedule Follow-up Appointment: Yes Medication Reconciliation completed and provided to Patient/Care No Anvay Tennis: Provided on Clinical Summary of Care: 08/10/2016 Form Type Recipient Paper Patient LP Electronic Signature(s) Signed: 08/10/2016 2:37:55 PM By: Ruthine Dose Entered By: Ruthine Dose on 08/10/2016 14:37:55 [name], [name] ([id number]) -------------------------------------------------------------------------------- Lower Extremity Assessment Details Patient Name: [name] Date of Service: 08/10/2016 1:30 PM Medical Record Number: [id number] Patient Account Number: [id number] Date of Birth/Sex: 06/26/1928 (83 y.o. Female) Treating RN: Montey Hora Primary Care Dennette Faulconer: Clayborn Bigness Other Clinician: Referring Guida Asman: Clayborn Bigness Treating Achille Xiang/Extender: Frann Rider in Treatment: 14 Vascular Assessment Pulses: Dorsalis Pedis Palpable: [Right:Yes] Posterior Tibial Extremity colors, hair growth, and conditions: Extremity Color: [Right:Mottled] Hair Growth on Extremity: [Right:No] Temperature of Extremity: [Right:Warm] Capillary Refill: [Right:< 3 seconds] Electronic Signature(s) Signed: 08/10/2016 5:23:06 PM By: Montey Hora Entered By: Montey Hora on 08/10/2016 14:16:44 [name], [name] ([id number]) -------------------------------------------------------------------------------- Multi Wound Chart Details Patient Name: [name] Date of Service: 08/10/2016 1:30 PM Medical Record Number: [id number] Patient Account Number: [id number] Date of Birth/Sex: 08/26/1928 (83 y.o. Female) Treating RN: Montey Hora Primary Care Thursa Emme: Clayborn Bigness Other Clinician: Referring Basheer Molchan: Clayborn Bigness Treating Robynn Marcel/Extender: Frann Rider in  Treatment: 14 Vital Signs Height(in): 60 Pulse(bpm): 79 Weight(lbs): 122 Blood Pressure 170/63 (mmHg): Body Mass Index(BMI): 24 Temperature(F): 98.1 Respiratory Rate 18 (breaths/min): Photos: [N/A:N/A] Wound Location: Right Lower Leg - Right Lower Leg - Distal N/A Anterior, Proximal Wounding Event: Trauma Trauma N/A Primary Etiology: Trauma, Other Trauma, Other N/A Comorbid History: Asthma, Hypertension Asthma, Hypertension N/A Date Acquired: 04/21/2016 04/21/2016 N/A Weeks of Treatment: 12 3 N/A Wound Status: Open Open N/A Measurements L x W x D 2x0.6x1.2 2x0.8x0.8 N/A (cm) Area (cm) : 0.942 1.257 N/A Volume (cm) : 1.131 1.005 N/A % Reduction in Area: 96.70% 61.90% N/A %  Reduction in Volume: 97.70% 69.50% N/A Classification: Full Thickness Without Full Thickness Without N/A Exposed Support Exposed Support Structures Structures Exudate Amount: Large Large N/A Exudate Type: Serous Serous N/A Exudate Color: amber amber N/A Wound Margin: Flat and Intact Flat and Intact N/A Granulation Amount: Large (67-100%) Large (67-100%) N/A Granulation Quality: Red Red, Pink N/A Pearman, Charleigh ([id number]) Necrotic Amount: Small (1-33%) Small (1-33%) N/A Necrotic Tissue: Eschar, Adherent Slough Adherent Slough N/A Exposed Structures: Fascia: No Fascia: No N/A Fat Layer (Subcutaneous Fat Layer (Subcutaneous Tissue) Exposed: No Tissue) Exposed: No Tendon: No Tendon: No Muscle: No Muscle: No Joint: No Joint: No Bone: No Bone: No Limited to Skin Limited to Skin Breakdown Breakdown Epithelialization: Small (1-33%) Small (1-33%) N/A Periwound Skin Texture: Excoriation: No Excoriation: No N/A Induration: No Induration: No Callus: No Callus: No Crepitus: No Crepitus: No Rash: No Rash: No Scarring: No Scarring: No Periwound Skin Maceration: No Maceration: No N/A Moisture: Dry/Scaly: No Dry/Scaly: No Periwound Skin Color: Erythema: Yes Atrophie Blanche: No  N/A Atrophie Blanche: No Cyanosis: No Cyanosis: No Ecchymosis: No Ecchymosis: No Erythema: No Hemosiderin Staining: No Hemosiderin Staining: No Mottled: No Mottled: No Pallor: No Pallor: No Rubor: No Rubor: No Erythema Location: Circumferential N/A N/A Tenderness on Yes Yes N/A Palpation: Wound Preparation: Ulcer Cleansing: Ulcer Cleansing: N/A Rinsed/Irrigated with Rinsed/Irrigated with Saline Saline Topical Anesthetic Topical Anesthetic Applied: Other: lidocaine Applied: Other: lidocaine 4% 4% Treatment Notes Wound #4 (Right, Proximal, Anterior Lower Leg) 1. Cleansed with: Clean wound with Normal Saline 2. Anesthetic Topical Lidocaine 4% cream to wound bed prior to debridement 4. Dressing Applied: Iodosorb Ointment Plain packing gauze 5. Secondary Dressing Applied [name], [name] ([id number]) Non-Adherent pad ABD and Kerlix/Conform 7. Secured with Tape Wound #5 (Right, Distal Lower Leg) 1. Cleansed with: Clean wound with Normal Saline 2. Anesthetic Topical Lidocaine 4% cream to wound bed prior to debridement 4. Dressing Applied: Iodosorb Ointment Plain packing gauze 5. Secondary Dressing Applied Non-Adherent pad ABD and Kerlix/Conform 7. Secured with Recruitment consultant) Signed: 08/10/2016 2:49:54 PM By: Christin Fudge MD, FACS Entered By: Christin Fudge on 08/10/2016 14:49:53 [name], [name] ([id number]) -------------------------------------------------------------------------------- [place] Details Patient Name: [name] Date of Service: 08/10/2016 1:30 PM Medical Record Number: [id number] Patient Account Number: [id number] Date of Birth/Sex: 05-19-1929 (83 y.o. Female) Treating RN: Montey Hora Primary Care Anab Vivar: Clayborn Bigness Other Clinician: Referring Ladislao Cohenour: Clayborn Bigness Treating Ezra Marquess/Extender: Frann Rider in Treatment: 14 Active Inactive ` Abuse / Safety / Falls / Self Care  Management Nursing Diagnoses: Impaired physical mobility Potential for falls Goals: Patient will remain injury free Date Initiated: 05/04/2016 Target Resolution Date: 07/25/2016 Goal Status: Active Interventions: Assess fall risk on admission and as needed Notes: ` Orientation to the Wound Care Program Nursing Diagnoses: Knowledge deficit related to the wound healing center program Goals: Patient/caregiver will verbalize understanding of the [place] Date Initiated: 05/04/2016 Target Resolution Date: 07/25/2016 Goal Status: Active Interventions: Provide education on orientation to the wound center Notes: ` Pain, Acute or Chronic Nursing Diagnoses: Pain, acute or chronic: actual or potential [name], [name] ([id number]) Goals: Patient/caregiver will verbalize adequate pain control between visits Date Initiated: 05/04/2016 Target Resolution Date: 07/25/2016 Goal Status: Active Interventions: Complete pain assessment as per visit requirements Notes: ` Wound/Skin Impairment Nursing Diagnoses: Impaired tissue integrity Goals: Patient/caregiver will verbalize understanding of skin care regimen Date Initiated: 05/04/2016 Target Resolution Date: 07/25/2016 Goal Status: Active Ulcer/skin breakdown will have a volume reduction of 30% by week 4 Date Initiated: 05/04/2016 Target Resolution  Date: 07/25/2016 Goal Status: Active Ulcer/skin breakdown will have a volume reduction of 50% by week 8 Date Initiated: 05/04/2016 Target Resolution Date: 08/22/2016 Goal Status: Active Ulcer/skin breakdown will have a volume reduction of 80% by week 12 Date Initiated: 05/04/2016 Target Resolution Date: 08/22/2016 Goal Status: Active Ulcer/skin breakdown will heal within 14 weeks Date Initiated: 05/04/2016 Target Resolution Date: 09/05/2016 Goal Status: Active Interventions: Assess patient/caregiver ability to obtain necessary supplies Assess patient/caregiver ability to  perform ulcer/skin care regimen upon admission and as needed Assess ulceration(s) every visit Notes: Electronic Signature(s) Signed: 08/10/2016 5:23:06 PM By: Montey Hora Entered By: Montey Hora on 08/10/2016 14:14:30 [name] ([id number]) -------------------------------------------------------------------------------- Pain Assessment Details Patient Name: [name] Date of Service: 08/10/2016 1:30 PM Medical Record Number: [id number] Patient Account Number: [id number] Date of Birth/Sex: 1928/06/07 (83 y.o. Female) Treating RN: Montey Hora Primary Care Tahjai Schetter: Clayborn Bigness Other Clinician: Referring Chandi Nicklin: Clayborn Bigness Treating Josimar Corning/Extender: Frann Rider in Treatment: 14 Active Problems Location of Pain Severity and Description of Pain Patient Has Paino Yes Site Locations Pain Location: Pain in Ulcers With Dressing Change: Yes Duration of the Pain. Constant / Intermittento Constant Pain Management and Medication Current Pain Management: Notes Topical or injectable lidocaine is offered to patient for acute pain when surgical debridement is performed. If needed, Patient is instructed to use over the counter pain medication for the following 24-48 hours after debridement. Wound care MDs do not prescribed pain medications. Patient has chronic pain or uncontrolled pain. Patient has been instructed to make an appointment with their Primary Care Physician for pain management. Electronic Signature(s) Signed: 08/10/2016 5:23:06 PM By: Montey Hora Entered By: Montey Hora on 08/10/2016 13:43:36 [name], [name] ([id number]) -------------------------------------------------------------------------------- Patient/Caregiver Education Details Patient Name: [name] Date of Service: 08/10/2016 1:30 PM Medical Record Number: [id number] Patient Account Number: [id number] Date of Birth/Gender: 1928/07/24 (83 y.o. Female) Treating RN:  Montey Hora Primary Care Physician: Clayborn Bigness Other Clinician: Referring Physician: Clayborn Bigness Treating Physician/Extender: Frann Rider in Treatment: 14 Education Assessment Education Provided To: Patient and Caregiver Education Topics Provided Wound/Skin Impairment: Handouts: Other: wound care as ordered Methods: Demonstration, Explain/Verbal Responses: State content correctly Electronic Signature(s) Signed: 08/10/2016 5:23:06 PM By: Montey Hora Entered By: Montey Hora on 08/10/2016 14:33:56 [name], [name] ([id number]) -------------------------------------------------------------------------------- Wound Assessment Details Patient Name: [name] Date of Service: 08/10/2016 1:30 PM Medical Record Number: [id number] Patient Account Number: [id number] Date of Birth/Sex: 05-Apr-1929 (83 y.o. Female) Treating RN: Montey Hora Primary Care Damesha Lawler: Clayborn Bigness Other Clinician: Referring Reigan Tolliver: Clayborn Bigness Treating Gilad Dugger/Extender: Frann Rider in Treatment: 14 Wound Status Wound Number: 4 Primary Etiology: Trauma, Other Wound Location: Right Lower Leg - Anterior, Wound Status: Open Proximal Comorbid History: Asthma, Hypertension Wounding Event: Trauma Date Acquired: 04/21/2016 Weeks Of Treatment: 12 Clustered Wound: No Photos Photo Uploaded By: Montey Hora on 08/10/2016 14:09:47 Wound Measurements Length: (cm) 2 Width: (cm) 0.6 Depth: (cm) 1.2 Area: (cm) 0.942 Volume: (cm) 1.131 % Reduction in Area: 96.7% % Reduction in Volume: 97.7% Epithelialization: Small (1-33%) Tunneling: No Undermining: No Wound Description Full Thickness Without Exposed Foul Odor Aft Classification: Support Structures Slough/Fibrin Wound Margin: Flat and Intact Exudate Large Amount: Exudate Type: Serous Exudate Color: amber er Cleansing: No o No Wound Bed Granulation Amount: Large (67-100%) Exposed Structure Granulation Quality:  Red Fascia Exposed: No [name], [name] ([id number]) Necrotic Amount: Small (1-33%) Fat Layer (Subcutaneous Tissue) Exposed: No Necrotic Quality: Eschar, Adherent Slough Tendon Exposed: No Muscle Exposed: No Joint Exposed: No Bone Exposed: No Limited to Skin  Breakdown Periwound Skin Texture Texture Color No Abnormalities Noted: No No Abnormalities Noted: No Callus: No Atrophie Blanche: No Crepitus: No Cyanosis: No Excoriation: No Ecchymosis: No Induration: No Erythema: Yes Rash: No Erythema Location: Circumferential Scarring: No Hemosiderin Staining: No Mottled: No Moisture Pallor: No No Abnormalities Noted: No Rubor: No Dry / Scaly: No Maceration: No Temperature / Pain Tenderness on Palpation: Yes Wound Preparation Ulcer Cleansing: Rinsed/Irrigated with Saline Topical Anesthetic Applied: Other: lidocaine 4%, Treatment Notes Wound #4 (Right, Proximal, Anterior Lower Leg) 1. Cleansed with: Clean wound with Normal Saline 2. Anesthetic Topical Lidocaine 4% cream to wound bed prior to debridement 4. Dressing Applied: Iodosorb Ointment Plain packing gauze 5. Secondary Dressing Applied Non-Adherent pad ABD and Kerlix/Conform 7. Secured with Recruitment consultant) Signed: 08/10/2016 5:23:06 PM By: Montey Hora Entered By: Montey Hora on 08/10/2016 14:05:23 [name] ([id number]) -------------------------------------------------------------------------------- Wound Assessment Details Patient Name: [name] Date of Service: 08/10/2016 1:30 PM Medical Record Number: [id number] Patient Account Number: [id number] Date of Birth/Sex: [birth year] (83 y.o. Female) Treating RN: Montey Hora Primary Care Kerah Hardebeck: Clayborn Bigness Other Clinician: Referring Epsie Walthall: Clayborn Bigness Treating Kemonte Ullman/Extender: Frann Rider in Treatment: 14 Wound Status Wound Number: 5 Primary Etiology: Trauma, Other Wound Location: Right Lower Leg -  Distal Wound Status: Open Wounding Event: Trauma Comorbid History: Asthma, Hypertension Date Acquired: 04/21/2016 Weeks Of Treatment: 3 Clustered Wound: No Photos Photo Uploaded By: Montey Hora on 08/10/2016 14:09:47 Wound Measurements Length: (cm) 2 Width: (cm) 0.8 Depth: (cm) 0.8 Area: (cm) 1.257 Volume: (cm) 1.005 % Reduction in Area: 61.9% % Reduction in Volume: 69.5% Epithelialization: Small (1-33%) Tunneling: No Undermining: No Wound Description Full Thickness Without Exposed Foul Odor Aft Classification: Support Structures Slough/Fibrin Wound Margin: Flat and Intact Exudate Large Amount: Exudate Type: Serous Exudate Color: amber er Cleansing: No o No Wound Bed Granulation Amount: Large (67-100%) Exposed Structure Granulation Quality: Red, Pink Fascia Exposed: No Necrotic Amount: Small (1-33%) Fat Layer (Subcutaneous Tissue) Exposed: No Stirn, Muriel ([id number]) Necrotic Quality: Adherent Slough Tendon Exposed: No Muscle Exposed: No Joint Exposed: No Bone Exposed: No Limited to Skin Breakdown Periwound Skin Texture Texture Color No Abnormalities Noted: No No Abnormalities Noted: No Callus: No Atrophie Blanche: No Crepitus: No Cyanosis: No Excoriation: No Ecchymosis: No Induration: No Erythema: No Rash: No Hemosiderin Staining: No Scarring: No Mottled: No Pallor: No Moisture Rubor: No No Abnormalities Noted: No Dry / Scaly: No Temperature / Pain Maceration: No Tenderness on Palpation: Yes Wound Preparation Ulcer Cleansing: Rinsed/Irrigated with Saline Topical Anesthetic Applied: Other: lidocaine 4%, Treatment Notes Wound #5 (Right, Distal Lower Leg) 1. Cleansed with: Clean wound with Normal Saline 2. Anesthetic Topical Lidocaine 4% cream to wound bed prior to debridement 4. Dressing Applied: Iodosorb Ointment Plain packing gauze 5. Secondary Dressing Applied Non-Adherent pad ABD and Kerlix/Conform 7. Secured  with Recruitment consultant) Signed: 08/10/2016 5:23:06 PM By: Montey Hora Entered By: Montey Hora on 08/10/2016 14:05:41 [name] ([id number]) -------------------------------------------------------------------------------- [place] Details Patient Name: [name] Date of Service: 08/10/2016 1:30 PM Medical Record Number: [id number] Patient Account Number: [id number] Date of Birth/Sex: 03-06-1929 (83 y.o. Female) Treating RN: Montey Hora Primary Care Chabely Norby: Clayborn Bigness Other Clinician: Referring Tuleen Mandelbaum: Clayborn Bigness Treating Dajaun Goldring/Extender: Frann Rider in Treatment: 14 Vital Signs Time Taken: 13:45 Temperature (F): 98.1 Height (in): 60 Pulse (bpm): 79 Weight (lbs): 122 Respiratory Rate (breaths/min): 18 Body Mass Index (BMI): 23.8 Blood Pressure (mmHg): 170/63 Reference Range: 80 - 120 mg / dl Electronic Signature(s) Signed: 08/10/2016 5:23:06 PM By: Montey Hora  Entered By: Montey Hora on 08/10/2016 13:45:30
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
## 2016-08-17 ENCOUNTER — Encounter: Payer: Medicare Other | Admitting: Surgery

## 2016-08-17 DIAGNOSIS — C44722 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, including hip: Secondary | ICD-10-CM | POA: Diagnosis not present
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
## 2016-08-18 NOTE — Progress Notes (Signed)
LANICE, FOLDEN ([id number]) Visit Report for 08/17/2016 Arrival Information Details Patient Name: MARGIT, BATTE Date of Service: 08/17/2016 2:30 PM Medical Record Number: [id number] Patient Account Number: [id number] Date of Birth/Sex: [birth year] (83 y.o. Female) Treating RN: Montey Hora Primary Care Hibba Schram: Clayborn Bigness Other Clinician: Referring Malaky Tetrault: Clayborn Bigness Treating Sabreena Vogan/Extender: Frann Rider in Treatment: 15 Visit Information History Since Last Visit Added or deleted any medications: No Patient Arrived: Ambulatory Any new allergies or adverse reactions: No Arrival Time: 14:43 Had a fall or experienced change in No Accompanied By: dtr activities of daily living that may affect Transfer Assistance: None risk of falls: Patient Identification Verified: Yes Signs or symptoms of abuse/neglect since last No Secondary Verification Process Yes visito Completed: Hospitalized since last visit: No Patient Requires Transmission-Based No Has Dressing in Place as Prescribed: Yes Precautions: Pain Present Now: Yes Patient Has Alerts: No Electronic Signature(s) Signed: 08/17/2016 5:13:43 PM By: Montey Hora Entered By: Montey Hora on 08/17/2016 14:44:02 [place], [name] ([id number]) -------------------------------------------------------------------------------- Encounter Discharge Information Details Patient Name: [name] Date of Service: 08/17/2016 2:30 PM Medical Record Number: [id number] Patient Account Number: [id number] Date of Birth/Sex: January 22, 1929 (83 y.o. Female) Treating RN: Montey Hora Primary Care Amariz Flamenco: Clayborn Bigness Other Clinician: Referring Eino Whitner: Clayborn Bigness Treating Brayam Boeke/Extender: Frann Rider in Treatment: 15 Encounter Discharge Information Items Discharge Pain Level: 0 Discharge Condition: Stable Ambulatory Status: Ambulatory Discharge Destination: Home Transportation: Private Auto Accompanied  By: dtr Schedule Follow-up Appointment: Yes Medication Reconciliation completed and provided to Patient/Care No Tanazia Achee: Provided on Clinical Summary of Care: 08/17/2016 Form Type Recipient Paper Patient LP Electronic Signature(s) Signed: 08/17/2016 3:35:07 PM By: Ruthine Dose Entered By: Ruthine Dose on 08/17/2016 15:35:07 [place], [name] ([id number]) -------------------------------------------------------------------------------- Lower Extremity Assessment Details Patient Name: [name] Date of Service: 08/17/2016 2:30 PM Medical Record Number: [id number] Patient Account Number: [id number] Date of Birth/Sex: 16-Jun-1928 (83 y.o. Female) Treating RN: Montey Hora Primary Care Deanna Wiater: Clayborn Bigness Other Clinician: Referring Tamari Busic: Clayborn Bigness Treating Emmaclaire Switala/Extender: Frann Rider in Treatment: 15 Vascular Assessment Pulses: Dorsalis Pedis Palpable: [Right:Yes] Posterior Tibial Extremity colors, hair growth, and conditions: Extremity Color: [Right:Hyperpigmented] Hair Growth on Extremity: [Right:No] Temperature of Extremity: [Right:Warm] Capillary Refill: [Right:< 3 seconds] Electronic Signature(s) Signed: 08/17/2016 5:13:43 PM By: Montey Hora Entered By: Montey Hora on 08/17/2016 15:05:54 Dauphinais, [name] ([id number]) -------------------------------------------------------------------------------- Multi Wound Chart Details Patient Name: [name] Date of Service: 08/17/2016 2:30 PM Medical Record Number: [id number] Patient Account Number: [id number] Date of Birth/Sex: 1929-04-20 (83 y.o. Female) Treating RN: Montey Hora Primary Care Stefon Ramthun: Clayborn Bigness Other Clinician: Referring Lantz Hermann: Clayborn Bigness Treating Davidlee Jeanbaptiste/Extender: Frann Rider in Treatment: 15 Vital Signs Height(in): 60 Pulse(bpm): 71 Weight(lbs): 122 Blood Pressure 178/74 (mmHg): Body Mass Index(BMI): 24 Temperature(F): 98.3 Respiratory  Rate 18 (breaths/min): Photos: [4:No Photos] [5:No Photos] [N/A:N/A] Wound Location: [4:Right Lower Leg - Anterior, Proximal] [5:Right Lower Leg - Distal] [N/A:N/A] Wounding Event: [4:Trauma] [5:Trauma] [N/A:N/A] Primary Etiology: [4:Trauma, Other] [5:Trauma, Other] [N/A:N/A] Comorbid History: [4:Asthma, Hypertension] [5:Asthma, Hypertension] [N/A:N/A] Date Acquired: [4:04/21/2016] [5:04/21/2016] [N/A:N/A] Weeks of Treatment: [4:13] [5:4] [N/A:N/A] Wound Status: [4:Open] [5:Open] [N/A:N/A] Measurements L x W x D 1.8x0.5x1 [5:1.8x0.7x1] [N/A:N/A] (cm) Area (cm) : [4:0.707] [5:0.99] [N/A:N/A] Volume (cm) : [4:0.707] [5:0.99] [N/A:N/A] % Reduction in Area: [4:97.60%] [5:70.00%] [N/A:N/A] % Reduction in Volume: 98.60% [5:70.00%] [N/A:N/A] Classification: [4:Full Thickness Without Exposed Support Structures] [5:Full Thickness Without Exposed Support Structures] [N/A:N/A] Exudate Amount: [4:Large] [5:Large] [N/A:N/A] Exudate Type: [4:Serous] [5:Serous] [N/A:N/A] Exudate Color: [4:amber] [5:amber] [N/A:N/A] Wound Margin: [4:Flat and Intact] [5:Flat and Intact] [N/A:N/A]  Granulation Amount: [4:Large (67-100%)] [5:Large (67-100%)] [N/A:N/A] Granulation Quality: [4:Red] [5:Red, Pink] [N/A:N/A] Necrotic Amount: [4:Small (1-33%)] [5:Small (1-33%)] [N/A:N/A] Necrotic Tissue: [4:Eschar, Adherent Slough] [5:Adherent Slough] [N/A:N/A] Exposed Structures: [4:Fascia: No Fat Layer (Subcutaneous Tissue) Exposed: No Tendon: No] [5:Fascia: No Fat Layer (Subcutaneous Tissue) Exposed: No Tendon: No] [N/A:N/A] Muscle: No Muscle: No Joint: No Joint: No Bone: No Bone: No Limited to Skin Limited to Skin Breakdown Breakdown Epithelialization: Small (1-33%) Small (1-33%) N/A Debridement: Debridement ([id number]- Debridement ([id number]- N/A 11047) 11047) Pre-procedure 15:06 15:04 N/A Verification/Time Out Taken: Pain Control: Lidocaine 4% Topical Lidocaine 4% Topical N/A Solution Solution Tissue Debrided:  Fibrin/Slough, Fibrin/Slough, N/A Subcutaneous Subcutaneous Level: Skin/Subcutaneous Skin/Subcutaneous N/A Tissue Tissue Debridement Area (sq 0.9 1.26 N/A cm): Instrument: Curette Curette N/A Bleeding: Minimum Minimum N/A Hemostasis Achieved: Pressure Pressure N/A Procedural Pain: 0 0 N/A Post Procedural Pain: 0 0 N/A Debridement Treatment Procedure was tolerated Procedure was tolerated N/A Response: well well Post Debridement 1.8x0.5x1 1.8x0.7x1 N/A Measurements L x W x D (cm) Post Debridement 0.707 0.99 N/A Volume: (cm) Periwound Skin Texture: Excoriation: No Excoriation: No N/A Induration: No Induration: No Callus: No Callus: No Crepitus: No Crepitus: No Rash: No Rash: No Scarring: No Scarring: No Periwound Skin Maceration: No Maceration: No N/A Moisture: Dry/Scaly: No Dry/Scaly: No Periwound Skin Color: Erythema: Yes Atrophie Blanche: No N/A Atrophie Blanche: No Cyanosis: No Cyanosis: No Ecchymosis: No Ecchymosis: No Erythema: No Hemosiderin Staining: No Hemosiderin Staining: No Mottled: No Mottled: No Pallor: No Pallor: No Rubor: No Rubor: No Erythema Location: Circumferential N/A N/A Tenderness on Yes Yes N/A Palpation: Galbraith, [name] ([id number]) Wound Preparation: Ulcer Cleansing: Ulcer Cleansing: N/A Rinsed/Irrigated with Rinsed/Irrigated with Saline Saline Topical Anesthetic Topical Anesthetic Applied: Other: lidocaine Applied: Other: lidocaine 4% 4% Procedures Performed: Debridement Debridement N/A Treatment Notes Electronic Signature(s) Signed: 08/17/2016 3:18:02 PM By: Christin Fudge MD, FACS Entered By: Christin Fudge on 08/17/2016 15:18:01 SHAARON, GOLLIDAY ([id number]) -------------------------------------------------------------------------------- [place] Details Patient Name: [name] Date of Service: 08/17/2016 2:30 PM Medical Record Number: [id number] Patient Account Number: [id number] Date of  Birth/Sex: May 16, 1929 (83 y.o. Female) Treating RN: Montey Hora Primary Care Weslie Pretlow: Clayborn Bigness Other Clinician: Referring Carry Ortez: Clayborn Bigness Treating Aniqua Briere/Extender: Frann Rider in Treatment: 15 Active Inactive ` Abuse / Safety / Falls / Self Care Management Nursing Diagnoses: Impaired physical mobility Potential for falls Goals: Patient will remain injury free Date Initiated: 05/04/2016 Target Resolution Date: 07/25/2016 Goal Status: Active Interventions: Assess fall risk on admission and as needed Notes: ` Orientation to the Wound Care Program Nursing Diagnoses: Knowledge deficit related to the wound healing center program Goals: Patient/caregiver will verbalize understanding of the [place] Date Initiated: 05/04/2016 Target Resolution Date: 07/25/2016 Goal Status: Active Interventions: Provide education on orientation to the wound center Notes: ` Pain, Acute or Chronic Nursing Diagnoses: Pain, acute or chronic: actual or potential DERICKA, OSTENSON ([id number]) Goals: Patient/caregiver will verbalize adequate pain control between visits Date Initiated: 05/04/2016 Target Resolution Date: 07/25/2016 Goal Status: Active Interventions: Complete pain assessment as per visit requirements Notes: ` Wound/Skin Impairment Nursing Diagnoses: Impaired tissue integrity Goals: Patient/caregiver will verbalize understanding of skin care regimen Date Initiated: 05/04/2016 Target Resolution Date: 07/25/2016 Goal Status: Active Ulcer/skin breakdown will have a volume reduction of 30% by week 4 Date Initiated: 05/04/2016 Target Resolution Date: 07/25/2016 Goal Status: Active Ulcer/skin breakdown will have a volume reduction of 50% by week 8 Date Initiated: 05/04/2016 Target Resolution Date: 08/22/2016 Goal Status: Active Ulcer/skin breakdown will have a volume reduction of 80%  by week 12 Date Initiated: 05/04/2016 Target Resolution Date:  08/22/2016 Goal Status: Active Ulcer/skin breakdown will heal within 14 weeks Date Initiated: 05/04/2016 Target Resolution Date: 09/05/2016 Goal Status: Active Interventions: Assess patient/caregiver ability to obtain necessary supplies Assess patient/caregiver ability to perform ulcer/skin care regimen upon admission and as needed Assess ulceration(s) every visit Notes: Electronic Signature(s) Signed: 08/17/2016 5:13:43 PM By: Montey Hora Entered By: Montey Hora on 08/17/2016 Latta, McCreary ([id number]) -------------------------------------------------------------------------------- Pain Assessment Details Patient Name: [name] Date of Service: 08/17/2016 2:30 PM Medical Record Number: [id number] Patient Account Number: [id number] Date of Birth/Sex: [birth year] (83 y.o. Female) Treating RN: Montey Hora Primary Care Yan Okray: Clayborn Bigness Other Clinician: Referring Ronnie Mallette: Clayborn Bigness Treating Siyana Erney/Extender: Frann Rider in Treatment: 15 Active Problems Location of Pain Severity and Description of Pain Patient Has Paino Yes Site Locations Pain Location: Pain in Ulcers With Dressing Change: Yes Duration of the Pain. Constant / Intermittento Constant Pain Management and Medication Current Pain Management: Notes Topical or injectable lidocaine is offered to patient for acute pain when surgical debridement is performed. If needed, Patient is instructed to use over the counter pain medication for the following 24-48 hours after debridement. Wound care MDs do not prescribed pain medications. Patient has chronic pain or uncontrolled pain. Patient has been instructed to make an appointment with their Primary Care Physician for pain management. Electronic Signature(s) Signed: 08/17/2016 5:13:43 PM By: Montey Hora Entered By: Montey Hora on 08/17/2016 14:44:21 [name]  ([id number]) -------------------------------------------------------------------------------- Patient/Caregiver Education Details Patient Name: [name] Date of Service: 08/17/2016 2:30 PM Medical Record Number: [id number] Patient Account Number: [id number] Date of Birth/Gender: August 10, 1928 (83 y.o. Female) Treating RN: Montey Hora Primary Care Physician: Clayborn Bigness Other Clinician: Referring Physician: Clayborn Bigness Treating Physician/Extender: Frann Rider in Treatment: 15 Education Assessment Education Provided To: Patient and Caregiver Education Topics Provided Wound/Skin Impairment: Handouts: Other: wound care as ordered Methods: Demonstration, Explain/Verbal Responses: State content correctly Electronic Signature(s) Signed: 08/17/2016 5:13:43 PM By: Montey Hora Entered By: Montey Hora on 08/17/2016 15:19:25 Bentley, [name] ([id number]) -------------------------------------------------------------------------------- Wound Assessment Details Patient Name: [name] Date of Service: 08/17/2016 2:30 PM Medical Record Number: [id number] Patient Account Number: [id number] Date of Birth/Sex: [birth year] (83 y.o. Female) Treating RN: Montey Hora Primary Care Rishard Delange: Clayborn Bigness Other Clinician: Referring Embree Brawley: Clayborn Bigness Treating Yandell Mcjunkins/Extender: Frann Rider in Treatment: 15 Wound Status Wound Number: 4 Primary Etiology: Trauma, Other Wound Location: Right Lower Leg - Anterior, Wound Status: Open Proximal Comorbid History: Asthma, Hypertension Wounding Event: Trauma Date Acquired: 04/21/2016 Weeks Of Treatment: 13 Clustered Wound: No Photos Photo Uploaded By: Montey Hora on 08/17/2016 16:50:04 Wound Measurements Length: (cm) 1.8 Width: (cm) 0.5 Depth: (cm) 1 Area: (cm) 0.707 Volume: (cm) 0.707 % Reduction in Area: 97.6% % Reduction in Volume: 98.6% Epithelialization: Small (1-33%) Tunneling: No Undermining:  No Wound Description Full Thickness Without Exposed Foul Odor Aft Classification: Support Structures Slough/Fibrin Wound Margin: Flat and Intact Exudate Large Amount: Exudate Type: Serous Exudate Color: amber er Cleansing: No o No Wound Bed Granulation Amount: Large (67-100%) Exposed Structure Granulation Quality: Red Fascia Exposed: No Darwin, Cedrica ([id number]) Necrotic Amount: Small (1-33%) Fat Layer (Subcutaneous Tissue) Exposed: No Necrotic Quality: Eschar, Adherent Slough Tendon Exposed: No Muscle Exposed: No Joint Exposed: No Bone Exposed: No Limited to Skin Breakdown Periwound Skin Texture Texture Color No Abnormalities Noted: No No Abnormalities Noted: No Callus: No Atrophie Blanche: No Crepitus: No Cyanosis: No Excoriation: No Ecchymosis: No Induration: No Erythema: Yes Rash: No Erythema Location: Circumferential  Scarring: No Hemosiderin Staining: No Mottled: No Moisture Pallor: No No Abnormalities Noted: No Rubor: No Dry / Scaly: No Maceration: No Temperature / Pain Tenderness on Palpation: Yes Wound Preparation Ulcer Cleansing: Rinsed/Irrigated with Saline Topical Anesthetic Applied: Other: lidocaine 4%, Treatment Notes Wound #4 (Right, Proximal, Anterior Lower Leg) 1. Cleansed with: Clean wound with Normal Saline 2. Anesthetic Topical Lidocaine 4% cream to wound bed prior to debridement 4. Dressing Applied: Prisma Ag 5. Secondary Dressing Applied Non-Adherent pad ABD and Kerlix/Conform 7. Secured with Recruitment consultant) Signed: 08/17/2016 5:13:43 PM By: Montey Hora Entered By: Montey Hora on 08/17/2016 15:04:58 [name] ([id number]) -------------------------------------------------------------------------------- Wound Assessment Details Patient Name: [name] Date of Service: 08/17/2016 2:30 PM Medical Record Number: [id number] Patient Account Number: [id number] Date of Birth/Sex: 1928-08-12  (83 y.o. Female) Treating RN: Montey Hora Primary Care Kerrington Greenhalgh: Clayborn Bigness Other Clinician: Referring Satrina Magallanes: Clayborn Bigness Treating Carrigan Delafuente/Extender: Frann Rider in Treatment: 15 Wound Status Wound Number: 5 Primary Etiology: Trauma, Other Wound Location: Right Lower Leg - Distal Wound Status: Open Wounding Event: Trauma Comorbid History: Asthma, Hypertension Date Acquired: 04/21/2016 Weeks Of Treatment: 4 Clustered Wound: No Photos Photo Uploaded By: Montey Hora on 08/17/2016 16:50:04 Wound Measurements Length: (cm) 1.8 Width: (cm) 0.7 Depth: (cm) 1 Area: (cm) 0.99 Volume: (cm) 0.99 % Reduction in Area: 70% % Reduction in Volume: 70% Epithelialization: Small (1-33%) Tunneling: No Undermining: No Wound Description Full Thickness Without Exposed Foul Odor Aft Classification: Support Structures Slough/Fibrin Wound Margin: Flat and Intact Exudate Large Amount: Exudate Type: Serous Exudate Color: amber er Cleansing: No o No Wound Bed Granulation Amount: Large (67-100%) Exposed Structure Granulation Quality: Red, Pink Fascia Exposed: No Necrotic Amount: Small (1-33%) Fat Layer (Subcutaneous Tissue) Exposed: No Kump, Suheily ([id number]) Necrotic Quality: Adherent Slough Tendon Exposed: No Muscle Exposed: No Joint Exposed: No Bone Exposed: No Limited to Skin Breakdown Periwound Skin Texture Texture Color No Abnormalities Noted: No No Abnormalities Noted: No Callus: No Atrophie Blanche: No Crepitus: No Cyanosis: No Excoriation: No Ecchymosis: No Induration: No Erythema: No Rash: No Hemosiderin Staining: No Scarring: No Mottled: No Pallor: No Moisture Rubor: No No Abnormalities Noted: No Dry / Scaly: No Temperature / Pain Maceration: No Tenderness on Palpation: Yes Wound Preparation Ulcer Cleansing: Rinsed/Irrigated with Saline Topical Anesthetic Applied: Other: lidocaine 4%, Treatment Notes Wound #5 (Right, Distal  Lower Leg) 1. Cleansed with: Clean wound with Normal Saline 2. Anesthetic Topical Lidocaine 4% cream to wound bed prior to debridement 4. Dressing Applied: Prisma Ag 5. Secondary Dressing Applied Non-Adherent pad ABD and Kerlix/Conform 7. Secured with Recruitment consultant) Signed: 08/17/2016 5:13:43 PM By: Montey Hora Entered By: Montey Hora on 08/17/2016 15:05:14 [name] ([id number]) -------------------------------------------------------------------------------- Vitals Details Patient Name: [name] Date of Service: 08/17/2016 2:30 PM Medical Record Number: [id number] Patient Account Number: [id number] Date of Birth/Sex: [birth year] (83 y.o. Female) Treating RN: Montey Hora Primary Care Lucresia Simic: Clayborn Bigness Other Clinician: Referring Zymere Patlan: Clayborn Bigness Treating Icey Tello/Extender: Frann Rider in Treatment: 15 Vital Signs Time Taken: 14:44 Temperature (F): 98.3 Height (in): 60 Pulse (bpm): 71 Weight (lbs): 122 Respiratory Rate (breaths/min): 18 Body Mass Index (BMI): 23.8 Blood Pressure (mmHg): 178/74 Reference Range: 80 - 120 mg / dl Electronic Signature(s) Signed: 08/17/2016 5:13:43 PM By: Montey Hora Entered By: Montey Hora on 08/17/2016 14:44:59

## 2016-08-25 ENCOUNTER — Encounter: Payer: Medicare Other | Admitting: Surgery

## 2016-08-25 DIAGNOSIS — C44722 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, including hip: Secondary | ICD-10-CM | POA: Diagnosis not present

## 2016-08-27 NOTE — Progress Notes (Signed)
ADALEEN, HULGAN ([id number]) Visit Report for 08/25/2016 Arrival Information Details Patient Name: [name], [name] Date of Service: 08/25/2016 1:30 PM Medical Record Number: [id number] Patient Account Number: [id number] Date of Birth/Sex: Sep 26, 1928 (83 y.o. Female) Treating RN: Montey Hora Primary Care Steve Youngberg: Clayborn Bigness Other Clinician: Referring Ashlyn Cabler: Clayborn Bigness Treating Casha Estupinan/Extender: Frann Rider in Treatment: 35 Visit Information History Since Last Visit Added or deleted any medications: No Patient Arrived: Ambulatory Any new allergies or adverse reactions: No Arrival Time: 13:41 Had a fall or experienced change in No Accompanied By: dtr activities of daily living that may affect Transfer Assistance: None risk of falls: Patient Identification Verified: Yes Signs or symptoms of abuse/neglect since last No Secondary Verification Process Yes visito Completed: Hospitalized since last visit: No Patient Requires Transmission-Based No Has Dressing in Place as Prescribed: Yes Precautions: Pain Present Now: No Patient Has Alerts: No Electronic Signature(s) Signed: 08/25/2016 4:48:01 PM By: Montey Hora Entered By: Montey Hora on 08/25/2016 13:41:35 Doshi, [name] ([id number]) -------------------------------------------------------------------------------- Clinic Level of Care Assessment Details Patient Name: [name] Date of Service: 08/25/2016 1:30 PM Medical Record Number: [id number] Patient Account Number: [id number] Date of Birth/Sex: May 16, 1929 (83 y.o. Female) Treating RN: Montey Hora Primary Care Joeann Steppe: Clayborn Bigness Other Clinician: Referring Markian Glockner: Clayborn Bigness Treating Kacen Mellinger/Extender: Frann Rider in Treatment: 16 Clinic Level of Care Assessment Items TOOL 4 Quantity Score []  - Use when only an EandM is performed on FOLLOW-UP visit 0 ASSESSMENTS - Nursing Assessment / Reassessment X - Reassessment of  Co-morbidities (includes updates in patient status) 1 10 X - Reassessment of Adherence to Treatment Plan 1 5 ASSESSMENTS - Wound and Skin Assessment / Reassessment []  - Simple Wound Assessment / Reassessment - one wound 0 X - Complex Wound Assessment / Reassessment - multiple wounds 2 5 []  - Dermatologic / Skin Assessment (not related to wound area) 0 ASSESSMENTS - Focused Assessment []  - Circumferential Edema Measurements - multi extremities 0 []  - Nutritional Assessment / Counseling / Intervention 0 X - Lower Extremity Assessment (monofilament, tuning fork, pulses) 1 5 []  - Peripheral Arterial Disease Assessment (using hand held doppler) 0 ASSESSMENTS - Ostomy and/or Continence Assessment and Care []  - Incontinence Assessment and Management 0 []  - Ostomy Care Assessment and Management (repouching, etc.) 0 PROCESS - Coordination of Care X - Simple Patient / Family Education for ongoing care 1 15 []  - Complex (extensive) Patient / Family Education for ongoing care 0 []  - Staff obtains Programmer, systems, Records, Test Results / Process Orders 0 []  - Staff telephones HHA, Nursing Homes / Clarify orders / etc 0 []  - Routine Transfer to another Facility (non-emergent condition) 0 Gasaway, Rilei ([id number]) []  - Routine Hospital Admission (non-emergent condition) 0 []  - New Admissions / Biomedical engineer / Ordering NPWT, Apligraf, etc. 0 []  - Emergency Hospital Admission (emergent condition) 0 X - Simple Discharge Coordination 1 10 []  - Complex (extensive) Discharge Coordination 0 PROCESS - Special Needs []  - Pediatric / Minor Patient Management 0 []  - Isolation Patient Management 0 []  - Hearing / Language / Visual special needs 0 []  - Assessment of Community assistance (transportation, D/C planning, etc.) 0 []  - Additional assistance / Altered mentation 0 []  - Support Surface(s) Assessment (bed, cushion, seat, etc.) 0 INTERVENTIONS - Wound Cleansing / Measurement []  - Simple Wound  Cleansing - one wound 0 X - Complex Wound Cleansing - multiple wounds 2 5 X - Wound Imaging (photographs - any number of wounds) 1 5 []  - Wound Tracing (instead of photographs)  0 []  - Simple Wound Measurement - one wound 0 X - Complex Wound Measurement - multiple wounds 2 5 INTERVENTIONS - Wound Dressings []  - Small Wound Dressing one or multiple wounds 0 X - Medium Wound Dressing one or multiple wounds 2 15 []  - Large Wound Dressing one or multiple wounds 0 []  - Application of Medications - topical 0 []  - Application of Medications - injection 0 INTERVENTIONS - Miscellaneous []  - External ear exam 0 Hevia, Lynda ([id number]) []  - Specimen Collection (cultures, biopsies, blood, body fluids, etc.) 0 []  - Specimen(s) / Culture(s) sent or taken to Lab for analysis 0 []  - Patient Transfer (multiple staff / Harrel Lemon Lift / Similar devices) 0 []  - Simple Staple / Suture removal (25 or less) 0 []  - Complex Staple / Suture removal (26 or more) 0 []  - Hypo / Hyperglycemic Management (close monitor of Blood Glucose) 0 []  - Ankle / Brachial Index (ABI) - do not check if billed separately 0 X - Vital Signs 1 5 Has the patient been seen at the hospital within the last three years: Yes Total Score: 115 Level Of Care: New/Established - Level 3 Electronic Signature(s) Signed: 08/25/2016 4:48:01 PM By: Montey Hora Entered By: Montey Hora on 08/25/2016 16:26:21 [name] ([id number]) -------------------------------------------------------------------------------- Encounter Discharge Information Details Patient Name: [name] Date of Service: 08/25/2016 1:30 PM Medical Record Number: [id number] Patient Account Number: [id number] Date of Birth/Sex: [birth year] (83 y.o. Female) Treating RN: Montey Hora Primary Care Jalayah Gutridge: Clayborn Bigness Other Clinician: Referring Maybelline Kolarik: Clayborn Bigness Treating Vang Kraeger/Extender: Frann Rider in Treatment: 33 Encounter Discharge  Information Items Discharge Pain Level: 0 Discharge Condition: Stable Ambulatory Status: Ambulatory Discharge Destination: Home Transportation: Private Auto Accompanied By: dtr Schedule Follow-up Appointment: Yes Medication Reconciliation completed and provided to Patient/Care No Arti Trang: Provided on Clinical Summary of Care: 08/25/2016 Form Type Recipient Paper Patient LP Electronic Signature(s) Signed: 08/25/2016 4:27:11 PM By: Montey Hora Previous Signature: 08/25/2016 2:36:55 PM Version By: Ruthine Dose Entered By: Montey Hora on 08/25/2016 16:27:11 [place], [name] ([id number]) -------------------------------------------------------------------------------- Lower Extremity Assessment Details Patient Name: [name] Date of Service: 08/25/2016 1:30 PM Medical Record Number: [id number] Patient Account Number: [id number] Date of Birth/Sex: [birth year] (83 y.o. Female) Treating RN: Montey Hora Primary Care Neeva Trew: Clayborn Bigness Other Clinician: Referring Eithan Beagle: Clayborn Bigness Treating Lachlan Pelto/Extender: Frann Rider in Treatment: 16 Vascular Assessment Pulses: Dorsalis Pedis Palpable: [Right:Yes] Posterior Tibial Extremity colors, hair growth, and conditions: Extremity Color: [Right:Mottled] Hair Growth on Extremity: [Right:No] Temperature of Extremity: [Right:Warm] Capillary Refill: [Right:< 3 seconds] Electronic Signature(s) Signed: 08/25/2016 4:48:01 PM By: Montey Hora Entered By: Montey Hora on 08/25/2016 13:57:52 Schweers, Amica ([id number]) -------------------------------------------------------------------------------- Multi Wound Chart Details Patient Name: [name] Date of Service: 08/25/2016 1:30 PM Medical Record Number: [id number] Patient Account Number: [id number] Date of Birth/Sex: January 15, 1929 (83 y.o. Female) Treating RN: Montey Hora Primary Care Rondo Spittler: Clayborn Bigness Other Clinician: Referring Selah Zelman:  Clayborn Bigness Treating Coburn Knaus/Extender: Frann Rider in Treatment: 16 Vital Signs Height(in): 60 Pulse(bpm): 74 Weight(lbs): 122 Blood Pressure 169/66 (mmHg): Body Mass Index(BMI): 24 Temperature(F): 98.0 Respiratory Rate 18 (breaths/min): Photos: [4:No Photos] [5:No Photos] [N/A:N/A] Wound Location: [4:Right Lower Leg - Anterior, Proximal] [5:Right Lower Leg - Distal] [N/A:N/A] Wounding Event: [4:Trauma] [5:Trauma] [N/A:N/A] Primary Etiology: [4:Trauma, Other] [5:Trauma, Other] [N/A:N/A] Comorbid History: [4:Asthma, Hypertension] [5:Asthma, Hypertension] [N/A:N/A] Date Acquired: [4:04/21/2016] [5:04/21/2016] [N/A:N/A] Weeks of Treatment: [4:14] [5:5] [N/A:N/A] Wound Status: [4:Open] [5:Open] [N/A:N/A] Measurements L x W x D 1.3x0.1x0.7 [5:1.1x0.2x0.5] [N/A:N/A] (cm) Area (cm) : [4:0.102] [5:0.173] [N/A:N/A]  Volume (cm) : [4:0.071] [5:0.086] [N/A:N/A] % Reduction in Area: [4:99.60%] [5:94.80%] [N/A:N/A] % Reduction in Volume: 99.90% [5:97.40%] [N/A:N/A] Classification: [4:Full Thickness Without Exposed Support Structures] [5:Full Thickness Without Exposed Support Structures] [N/A:N/A] Exudate Amount: [4:Large] [5:Large] [N/A:N/A] Exudate Type: [4:Serous] [5:Serous] [N/A:N/A] Exudate Color: [4:amber] [5:amber] [N/A:N/A] Wound Margin: [4:Flat and Intact] [5:Flat and Intact] [N/A:N/A] Granulation Amount: [4:Large (67-100%)] [5:Large (67-100%)] [N/A:N/A] Granulation Quality: [4:Red] [5:Red, Pink] [N/A:N/A] Necrotic Amount: [4:Small (1-33%)] [5:Small (1-33%)] [N/A:N/A] Necrotic Tissue: [4:Eschar, Adherent Slough] [5:Adherent Slough] [N/A:N/A] Exposed Structures: [4:Fascia: No Fat Layer (Subcutaneous Tissue) Exposed: No Tendon: No] [5:Fascia: No Fat Layer (Subcutaneous Tissue) Exposed: No Tendon: No] [N/A:N/A] Muscle: No Muscle: No Joint: No Joint: No Bone: No Bone: No Limited to Skin Limited to Skin Breakdown Breakdown Epithelialization: Small (1-33%) Small  (1-33%) N/A Periwound Skin Texture: Excoriation: No Excoriation: No N/A Induration: No Induration: No Callus: No Callus: No Crepitus: No Crepitus: No Rash: No Rash: No Scarring: No Scarring: No Periwound Skin Maceration: No Maceration: No N/A Moisture: Dry/Scaly: No Dry/Scaly: No Periwound Skin Color: Erythema: Yes Atrophie Blanche: No N/A Atrophie Blanche: No Cyanosis: No Cyanosis: No Ecchymosis: No Ecchymosis: No Erythema: No Hemosiderin Staining: No Hemosiderin Staining: No Mottled: No Mottled: No Pallor: No Pallor: No Rubor: No Rubor: No Erythema Location: Circumferential N/A N/A Tenderness on Yes Yes N/A Palpation: Wound Preparation: Ulcer Cleansing: Ulcer Cleansing: N/A Rinsed/Irrigated with Rinsed/Irrigated with Saline Saline Topical Anesthetic Topical Anesthetic Applied: Other: lidocaine Applied: Other: lidocaine 4% 4% Treatment Notes Electronic Signature(s) Signed: 08/25/2016 2:47:14 PM By: Christin Fudge MD, FACS Entered By: Christin Fudge on 08/25/2016 14:47:14 SABREA, SANKEY ([id number]) -------------------------------------------------------------------------------- [place] Details Patient Name: [name] Date of Service: 08/25/2016 1:30 PM Medical Record Number: [id number] Patient Account Number: [id number] Date of Birth/Sex: 1928-12-13 (83 y.o. Female) Treating RN: Montey Hora Primary Care Bethania Schlotzhauer: Clayborn Bigness Other Clinician: Referring Raia Amico: Clayborn Bigness Treating Hong Moring/Extender: Frann Rider in Treatment: 16 Active Inactive ` Abuse / Safety / Falls / Self Care Management Nursing Diagnoses: Impaired physical mobility Potential for falls Goals: Patient will remain injury free Date Initiated: 05/04/2016 Target Resolution Date: 07/25/2016 Goal Status: Active Interventions: Assess fall risk on admission and as needed Notes: ` Orientation to the Wound Care Program Nursing  Diagnoses: Knowledge deficit related to the wound healing center program Goals: Patient/caregiver will verbalize understanding of the [place] Date Initiated: 05/04/2016 Target Resolution Date: 07/25/2016 Goal Status: Active Interventions: Provide education on orientation to the wound center Notes: ` Pain, Acute or Chronic Nursing Diagnoses: Pain, acute or chronic: actual or potential AVAIYAH, STRUBEL ([id number]) Goals: Patient/caregiver will verbalize adequate pain control between visits Date Initiated: 05/04/2016 Target Resolution Date: 07/25/2016 Goal Status: Active Interventions: Complete pain assessment as per visit requirements Notes: ` Wound/Skin Impairment Nursing Diagnoses: Impaired tissue integrity Goals: Patient/caregiver will verbalize understanding of skin care regimen Date Initiated: 05/04/2016 Target Resolution Date: 07/25/2016 Goal Status: Active Ulcer/skin breakdown will have a volume reduction of 30% by week 4 Date Initiated: 05/04/2016 Target Resolution Date: 07/25/2016 Goal Status: Active Ulcer/skin breakdown will have a volume reduction of 50% by week 8 Date Initiated: 05/04/2016 Target Resolution Date: 08/22/2016 Goal Status: Active Ulcer/skin breakdown will have a volume reduction of 80% by week 12 Date Initiated: 05/04/2016 Target Resolution Date: 08/22/2016 Goal Status: Active Ulcer/skin breakdown will heal within 14 weeks Date Initiated: 05/04/2016 Target Resolution Date: 09/05/2016 Goal Status: Active Interventions: Assess patient/caregiver ability to obtain necessary supplies Assess patient/caregiver ability to perform ulcer/skin care regimen upon admission and as needed  Assess ulceration(s) every visit Notes: Electronic Signature(s) Signed: 08/25/2016 4:48:01 PM By: Montey Hora Entered By: Montey Hora on 08/25/2016 14:14:44 Mariani, [name]  ([id number]) -------------------------------------------------------------------------------- Pain Assessment Details Patient Name: [name] Date of Service: 08/25/2016 1:30 PM Medical Record Number: [id number] Patient Account Number: [id number] Date of Birth/Sex: 18-Jan-1929 (83 y.o. Female) Treating RN: Montey Hora Primary Care Anapaola Kinsel: Clayborn Bigness Other Clinician: Referring Kamryn Messineo: Clayborn Bigness Treating Bobbye Petti/Extender: Frann Rider in Treatment: 16 Active Problems Location of Pain Severity and Description of Pain Patient Has Paino No Site Locations Pain Management and Medication Current Pain Management: Notes Topical or injectable lidocaine is offered to patient for acute pain when surgical debridement is performed. If needed, Patient is instructed to use over the counter pain medication for the following 24-48 hours after debridement. Wound care MDs do not prescribed pain medications. Patient has chronic pain or uncontrolled pain. Patient has been instructed to make an appointment with their Primary Care Physician for pain management. Electronic Signature(s) Signed: 08/25/2016 4:48:01 PM By: Montey Hora Entered By: Montey Hora on 08/25/2016 13:41:46 [name] ([id number]) -------------------------------------------------------------------------------- Patient/Caregiver Education Details Patient Name: [name] Date of Service: 08/25/2016 1:30 PM Medical Record Number: [id number] Patient Account Number: [id number] Date of Birth/Gender: 02-Apr-1929 (83 y.o. Female) Treating RN: Montey Hora Primary Care Physician: Clayborn Bigness Other Clinician: Referring Physician: Clayborn Bigness Treating Physician/Extender: Frann Rider in Treatment: 16 Education Assessment Education Provided To: Patient and Caregiver Education Topics Provided Wound/Skin Impairment: Handouts: Other: wound care as ordered Methods: Demonstration,  Explain/Verbal Responses: State content correctly Electronic Signature(s) Signed: 08/25/2016 4:48:01 PM By: Montey Hora Entered By: Montey Hora on 08/25/2016 16:27:34 Sambrano, [name] ([id number]) -------------------------------------------------------------------------------- Wound Assessment Details Patient Name: [name] Date of Service: 08/25/2016 1:30 PM Medical Record Number: [id number] Patient Account Number: [id number] Date of Birth/Sex: August 16, 1928 (83 y.o. Female) Treating RN: Montey Hora Primary Care Earsel Shouse: Clayborn Bigness Other Clinician: Referring Derick Seminara: Clayborn Bigness Treating Brogen Duell/Extender: Frann Rider in Treatment: 16 Wound Status Wound Number: 4 Primary Etiology: Trauma, Other Wound Location: Right Lower Leg - Anterior, Wound Status: Open Proximal Comorbid History: Asthma, Hypertension Wounding Event: Trauma Date Acquired: 04/21/2016 Weeks Of Treatment: 14 Clustered Wound: No Wound Measurements Length: (cm) 1.3 Width: (cm) 0.1 Depth: (cm) 0.7 Area: (cm) 0.102 Volume: (cm) 0.071 % Reduction in Area: 99.6% % Reduction in Volume: 99.9% Epithelialization: Small (1-33%) Tunneling: No Undermining: No Wound Description Full Thickness Without Exposed Foul Odor Aft Classification: Support Structures Slough/Fibrin Wound Margin: Flat and Intact Exudate Large Amount: Exudate Type: Serous Exudate Color: amber er Cleansing: No o No Wound Bed Granulation Amount: Large (67-100%) Exposed Structure Granulation Quality: Red Fascia Exposed: No Necrotic Amount: Small (1-33%) Fat Layer (Subcutaneous Tissue) Exposed: No Necrotic Quality: Eschar, Adherent Slough Tendon Exposed: No Muscle Exposed: No Joint Exposed: No Bone Exposed: No Limited to Skin Breakdown Periwound Skin Texture Texture Color No Abnormalities Noted: No No Abnormalities Noted: No Callus: No Atrophie Blanche: No Armstead, Satia ([id number]) Crepitus:  No Cyanosis: No Excoriation: No Ecchymosis: No Induration: No Erythema: Yes Rash: No Erythema Location: Circumferential Scarring: No Hemosiderin Staining: No Mottled: No Moisture Pallor: No No Abnormalities Noted: No Rubor: No Dry / Scaly: No Maceration: No Temperature / Pain Tenderness on Palpation: Yes Wound Preparation Ulcer Cleansing: Rinsed/Irrigated with Saline Topical Anesthetic Applied: Other: lidocaine 4%, Treatment Notes Wound #4 (Right, Proximal, Anterior Lower Leg) 1. Cleansed with: Clean wound with Normal Saline 2. Anesthetic Topical Lidocaine 4% cream to wound bed prior to debridement 4. Dressing Applied: Prisma Ag 5.  Secondary Dressing Applied Non-Adherent pad ABD and Kerlix/Conform 7. Secured with Recruitment consultant) Signed: 08/25/2016 4:48:01 PM By: Montey Hora Entered By: Montey Hora on 08/25/2016 13:57:10 Pitz, [name] ([id number]) -------------------------------------------------------------------------------- Wound Assessment Details Patient Name: [name] Date of Service: 08/25/2016 1:30 PM Medical Record Number: [id number] Patient Account Number: [id number] Date of Birth/Sex: 1928/11/29 (83 y.o. Female) Treating RN: Montey Hora Primary Care Lafayette Dunlevy: Clayborn Bigness Other Clinician: Referring Elester Apodaca: Clayborn Bigness Treating Camilla Skeen/Extender: Frann Rider in Treatment: 16 Wound Status Wound Number: 5 Primary Etiology: Trauma, Other Wound Location: Right Lower Leg - Distal Wound Status: Open Wounding Event: Trauma Comorbid History: Asthma, Hypertension Date Acquired: 04/21/2016 Weeks Of Treatment: 5 Clustered Wound: No Wound Measurements Length: (cm) 1.1 Width: (cm) 0.2 Depth: (cm) 0.5 Area: (cm) 0.173 Volume: (cm) 0.086 % Reduction in Area: 94.8% % Reduction in Volume: 97.4% Epithelialization: Small (1-33%) Tunneling: No Undermining: No Wound Description Full Thickness Without Exposed  Foul Odor Aft Classification: Support Structures Slough/Fibrin Wound Margin: Flat and Intact Exudate Large Amount: Exudate Type: Serous Exudate Color: amber er Cleansing: No o No Wound Bed Granulation Amount: Large (67-100%) Exposed Structure Granulation Quality: Red, Pink Fascia Exposed: No Necrotic Amount: Small (1-33%) Fat Layer (Subcutaneous Tissue) Exposed: No Necrotic Quality: Adherent Slough Tendon Exposed: No Muscle Exposed: No Joint Exposed: No Bone Exposed: No Limited to Skin Breakdown Periwound Skin Texture Texture Color No Abnormalities Noted: No No Abnormalities Noted: No Callus: No Atrophie Blanche: No Crepitus: No Cyanosis: No Wojtkiewicz, Ravonda ([id number]) Excoriation: No Ecchymosis: No Induration: No Erythema: No Rash: No Hemosiderin Staining: No Scarring: No Mottled: No Pallor: No Moisture Rubor: No No Abnormalities Noted: No Dry / Scaly: No Temperature / Pain Maceration: No Tenderness on Palpation: Yes Wound Preparation Ulcer Cleansing: Rinsed/Irrigated with Saline Topical Anesthetic Applied: Other: lidocaine 4%, Treatment Notes Wound #5 (Right, Distal Lower Leg) 1. Cleansed with: Clean wound with Normal Saline 2. Anesthetic Topical Lidocaine 4% cream to wound bed prior to debridement 4. Dressing Applied: Prisma Ag 5. Secondary Dressing Applied Non-Adherent pad ABD and Kerlix/Conform 7. Secured with Recruitment consultant) Signed: 08/25/2016 4:48:01 PM By: Montey Hora Entered By: Montey Hora on 08/25/2016 13:57:24 Vanderwerf, [name] ([id number]) -------------------------------------------------------------------------------- [place] Details Patient Name: [name] Date of Service: 08/25/2016 1:30 PM Medical Record Number: [id number] Patient Account Number: [id number] Date of Birth/Sex: Mar 07, 1929 (83 y.o. Female) Treating RN: Montey Hora Primary Care Mckay Brandt: Clayborn Bigness Other Clinician: Referring  Lexton Hidalgo: Clayborn Bigness Treating Lokelani Lutes/Extender: Frann Rider in Treatment: 16 Vital Signs Time Taken: 13:42 Temperature (F): 98.0 Height (in): 60 Pulse (bpm): 74 Weight (lbs): 122 Respiratory Rate (breaths/min): 18 Body Mass Index (BMI): 23.8 Blood Pressure (mmHg): 169/66 Reference Range: 80 - 120 mg / dl Electronic Signature(s) Signed: 08/25/2016 4:48:01 PM By: Montey Hora Entered By: Montey Hora on 08/25/2016 13:44:08
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
## 2016-08-31 ENCOUNTER — Encounter: Payer: Medicare Other | Attending: Surgery | Admitting: Surgery

## 2016-08-31 DIAGNOSIS — I739 Peripheral vascular disease, unspecified: Secondary | ICD-10-CM | POA: Insufficient documentation

## 2016-08-31 DIAGNOSIS — C44722 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, including hip: Secondary | ICD-10-CM | POA: Insufficient documentation

## 2016-08-31 DIAGNOSIS — L97312 Non-pressure chronic ulcer of right ankle with fat layer exposed: Secondary | ICD-10-CM | POA: Diagnosis not present

## 2016-08-31 DIAGNOSIS — R011 Cardiac murmur, unspecified: Secondary | ICD-10-CM | POA: Diagnosis not present

## 2016-08-31 DIAGNOSIS — C44729 Squamous cell carcinoma of skin of left lower limb, including hip: Secondary | ICD-10-CM | POA: Insufficient documentation

## 2016-08-31 DIAGNOSIS — I351 Nonrheumatic aortic (valve) insufficiency: Secondary | ICD-10-CM | POA: Insufficient documentation

## 2016-08-31 DIAGNOSIS — J45909 Unspecified asthma, uncomplicated: Secondary | ICD-10-CM | POA: Diagnosis not present

## 2016-08-31 DIAGNOSIS — I10 Essential (primary) hypertension: Secondary | ICD-10-CM | POA: Insufficient documentation

## 2016-08-31 DIAGNOSIS — S81811A Laceration without foreign body, right lower leg, initial encounter: Secondary | ICD-10-CM | POA: Insufficient documentation

## 2016-08-31 DIAGNOSIS — F419 Anxiety disorder, unspecified: Secondary | ICD-10-CM | POA: Insufficient documentation

## 2016-08-31 DIAGNOSIS — X58XXXA Exposure to other specified factors, initial encounter: Secondary | ICD-10-CM | POA: Insufficient documentation
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
## 2016-09-04 NOTE — Progress Notes (Signed)
MADIA, CARVELL ([id number]) Visit Report for 08/31/2016 Arrival Information Details Patient Name: [name], [name] Date of Service: 08/31/2016 2:30 PM Medical Record Number: [id number] Patient Account Number: [id number] Date of Birth/Sex: 14-May-1929 (83 y.o. Female) Treating RN: Montey Hora Primary Care Alaiah Lundy: Clayborn Bigness Other Clinician: Referring Chellsea Beckers: Clayborn Bigness Treating Anderia Lorenzo/Extender: Frann Rider in Treatment: 60 Visit Information History Since Last Visit Added or deleted any medications: No Patient Arrived: Ambulatory Any new allergies or adverse reactions: No Arrival Time: 14:37 Had a fall or experienced change in No Accompanied By: dtr activities of daily living that may affect Transfer Assistance: None risk of falls: Patient Identification Verified: Yes Signs or symptoms of abuse/neglect since last No Secondary Verification Process Yes visito Completed: Hospitalized since last visit: No Patient Requires Transmission-Based No Has Dressing in Place as Prescribed: Yes Precautions: Pain Present Now: Yes Patient Has Alerts: No Electronic Signature(s) Signed: 09/04/2016 9:41:00 AM By: Montey Hora Entered By: Montey Hora on 08/31/2016 14:38:31 [name] ([id number]) -------------------------------------------------------------------------------- Clinic Level of Care Assessment Details Patient Name: [name] Date of Service: 08/31/2016 2:30 PM Medical Record Number: [id number] Patient Account Number: [id number] Date of Birth/Sex: 03/29/1929 (83 y.o. Female) Treating RN: Montey Hora Primary Care Orenthal Debski: Clayborn Bigness Other Clinician: Referring Lacrecia Delval: Clayborn Bigness Treating Khian Remo/Extender: Frann Rider in Treatment: 17 Clinic Level of Care Assessment Items TOOL 4 Quantity Score []  - Use when only an EandM is performed on FOLLOW-UP visit 0 ASSESSMENTS - Nursing Assessment / Reassessment X - Reassessment of  Co-morbidities (includes updates in patient status) 1 10 X - Reassessment of Adherence to Treatment Plan 1 5 ASSESSMENTS - Wound and Skin Assessment / Reassessment []  - Simple Wound Assessment / Reassessment - one wound 0 X - Complex Wound Assessment / Reassessment - multiple wounds 2 5 []  - Dermatologic / Skin Assessment (not related to wound area) 0 ASSESSMENTS - Focused Assessment []  - Circumferential Edema Measurements - multi extremities 0 []  - Nutritional Assessment / Counseling / Intervention 0 X - Lower Extremity Assessment (monofilament, tuning fork, pulses) 1 5 []  - Peripheral Arterial Disease Assessment (using hand held doppler) 0 ASSESSMENTS - Ostomy and/or Continence Assessment and Care []  - Incontinence Assessment and Management 0 []  - Ostomy Care Assessment and Management (repouching, etc.) 0 PROCESS - Coordination of Care X - Simple Patient / Family Education for ongoing care 1 15 []  - Complex (extensive) Patient / Family Education for ongoing care 0 []  - Staff obtains Programmer, systems, Records, Test Results / Process Orders 0 []  - Staff telephones HHA, Nursing Homes / Clarify orders / etc 0 []  - Routine Transfer to another Facility (non-emergent condition) 0 [name], [name] ([id number]) []  - Routine Hospital Admission (non-emergent condition) 0 []  - New Admissions / Biomedical engineer / Ordering NPWT, Apligraf, etc. 0 []  - Emergency Hospital Admission (emergent condition) 0 X - Simple Discharge Coordination 1 10 []  - Complex (extensive) Discharge Coordination 0 PROCESS - Special Needs []  - Pediatric / Minor Patient Management 0 []  - Isolation Patient Management 0 []  - Hearing / Language / Visual special needs 0 []  - Assessment of Community assistance (transportation, D/C planning, etc.) 0 []  - Additional assistance / Altered mentation 0 []  - Support Surface(s) Assessment (bed, cushion, seat, etc.) 0 INTERVENTIONS - Wound Cleansing / Measurement []  - Simple Wound  Cleansing - one wound 0 X - Complex Wound Cleansing - multiple wounds 2 5 X - Wound Imaging (photographs - any number of wounds) 1 5 []  - Wound Tracing (instead of photographs)  0 []  - Simple Wound Measurement - one wound 0 X - Complex Wound Measurement - multiple wounds 2 5 INTERVENTIONS - Wound Dressings X - Small Wound Dressing one or multiple wounds 2 10 []  - Medium Wound Dressing one or multiple wounds 0 []  - Large Wound Dressing one or multiple wounds 0 []  - Application of Medications - topical 0 []  - Application of Medications - injection 0 INTERVENTIONS - Miscellaneous []  - External ear exam 0 [name], [name] ([id number]) []  - Specimen Collection (cultures, biopsies, blood, body fluids, etc.) 0 []  - Specimen(s) / Culture(s) sent or taken to Lab for analysis 0 []  - Patient Transfer (multiple staff / Harrel Lemon Lift / Similar devices) 0 []  - Simple Staple / Suture removal (25 or less) 0 []  - Complex Staple / Suture removal (26 or more) 0 []  - Hypo / Hyperglycemic Management (close monitor of Blood Glucose) 0 []  - Ankle / Brachial Index (ABI) - do not check if billed separately 0 X - Vital Signs 1 5 Has the patient been seen at the hospital within the last three years: Yes Total Score: 105 Level Of Care: New/Established - Level 3 Electronic Signature(s) Signed: 09/04/2016 9:41:00 AM By: Montey Hora Entered By: Montey Hora on 08/31/2016 15:33:22 [name] ([id number]) -------------------------------------------------------------------------------- Encounter Discharge Information Details Patient Name: [name] Date of Service: 08/31/2016 2:30 PM Medical Record Number: [id number] Patient Account Number: [id number] Date of Birth/Sex: 02/09/1929 (83 y.o. Female) Treating RN: Montey Hora Primary Care Tomekia Helton: Clayborn Bigness Other Clinician: Referring Addalynn Kumari: Clayborn Bigness Treating Belladonna Lubinski/Extender: Frann Rider in Treatment: 16 Encounter Discharge  Information Items Discharge Pain Level: 0 Discharge Condition: Stable Ambulatory Status: Ambulatory Discharge Destination: Home Transportation: Private Auto Accompanied By: dtr Schedule Follow-up Appointment: Yes Medication Reconciliation completed and provided to Patient/Care No Lakeisa Heninger: Provided on Clinical Summary of Care: 08/31/2016 Form Type Recipient Paper Patient LP Electronic Signature(s) Signed: 08/31/2016 4:16:12 PM By: Montey Hora Previous Signature: 08/31/2016 3:37:55 PM Version By: Ruthine Dose Entered By: Montey Hora on 08/31/2016 [place], [name] ([id number]) -------------------------------------------------------------------------------- Lower Extremity Assessment Details Patient Name: [name] Date of Service: 08/31/2016 2:30 PM Medical Record Number: [id number] Patient Account Number: [id number] Date of Birth/Sex: Jun 06, 1928 (83 y.o. Female) Treating RN: Montey Hora Primary Care Haylynn Pha: Clayborn Bigness Other Clinician: Referring Faraaz Wolin: Clayborn Bigness Treating Hadlie Gipson/Extender: Frann Rider in Treatment: 17 Vascular Assessment Pulses: Dorsalis Pedis Palpable: [Right:Yes] Posterior Tibial Extremity colors, hair growth, and conditions: Extremity Color: [Right:Mottled] Hair Growth on Extremity: [Right:No] Temperature of Extremity: [Right:Warm] Capillary Refill: [Right:< 3 seconds] Electronic Signature(s) Signed: 09/04/2016 9:41:00 AM By: Montey Hora Entered By: Montey Hora on 08/31/2016 15:12:55 [name], [name] ([id number]) -------------------------------------------------------------------------------- Multi Wound Chart Details Patient Name: [name] Date of Service: 08/31/2016 2:30 PM Medical Record Number: [id number] Patient Account Number: [id number] Date of Birth/Sex: 08-17-1928 (83 y.o. Female) Treating RN: Montey Hora Primary Care Treylan Mcclintock: Clayborn Bigness Other Clinician: Referring Elna Radovich: Clayborn Bigness Treating Stacia Feazell/Extender: Frann Rider in Treatment: 17 Vital Signs Height(in): 60 Pulse(bpm): 78 Weight(lbs): 122 Blood Pressure 149/72 (mmHg): Body Mass Index(BMI): 24 Temperature(F): 98.1 Respiratory Rate 18 (breaths/min): Photos: [4:No Photos] [5:No Photos] [N/A:N/A] Wound Location: [4:Right Lower Leg - Anterior, Proximal] [5:Right Lower Leg - Distal] [N/A:N/A] Wounding Event: [4:Trauma] [5:Trauma] [N/A:N/A] Primary Etiology: [4:Trauma, Other] [5:Trauma, Other] [N/A:N/A] Comorbid History: [4:Asthma, Hypertension] [5:Asthma, Hypertension] [N/A:N/A] Date Acquired: [4:04/21/2016] [5:04/21/2016] [N/A:N/A] Weeks of Treatment: [4:15] [5:6] [N/A:N/A] Wound Status: [4:Open] [5:Open] [N/A:N/A] Measurements L x W x D 1.1x0.2x0.7 [5:1.1x0.2x0.4] [N/A:N/A] (cm) Area (cm) : [4:0.173] [5:0.173] [N/A:N/A]  Volume (cm) : [4:0.121] [5:0.069] [N/A:N/A] % Reduction in Area: [4:99.40%] [5:94.80%] [N/A:N/A] % Reduction in Volume: 99.80% [5:97.90%] [N/A:N/A] Classification: [4:Full Thickness Without Exposed Support Structures] [5:Full Thickness Without Exposed Support Structures] [N/A:N/A] Exudate Amount: [4:Large] [5:Large] [N/A:N/A] Exudate Type: [4:Serous] [5:Serous] [N/A:N/A] Exudate Color: [4:amber] [5:amber] [N/A:N/A] Wound Margin: [4:Flat and Intact] [5:Flat and Intact] [N/A:N/A] Granulation Amount: [4:Large (67-100%)] [5:Large (67-100%)] [N/A:N/A] Granulation Quality: [4:Red] [5:Red, Pink] [N/A:N/A] Necrotic Amount: [4:Small (1-33%)] [5:Small (1-33%)] [N/A:N/A] Necrotic Tissue: [4:Eschar, Adherent Slough] [5:Adherent Slough] [N/A:N/A] Exposed Structures: [4:Fascia: No Fat Layer (Subcutaneous Tissue) Exposed: No Tendon: No] [5:Fascia: No Fat Layer (Subcutaneous Tissue) Exposed: No Tendon: No] [N/A:N/A] Muscle: No Muscle: No Joint: No Joint: No Bone: No Bone: No Limited to Skin Limited to Skin Breakdown Breakdown Epithelialization: Small (1-33%) Small (1-33%)  N/A Periwound Skin Texture: Excoriation: No Excoriation: No N/A Induration: No Induration: No Callus: No Callus: No Crepitus: No Crepitus: No Rash: No Rash: No Scarring: No Scarring: No Periwound Skin Maceration: No Maceration: No N/A Moisture: Dry/Scaly: No Dry/Scaly: No Periwound Skin Color: Erythema: Yes Atrophie Blanche: No N/A Atrophie Blanche: No Cyanosis: No Cyanosis: No Ecchymosis: No Ecchymosis: No Erythema: No Hemosiderin Staining: No Hemosiderin Staining: No Mottled: No Mottled: No Pallor: No Pallor: No Rubor: No Rubor: No Erythema Location: Circumferential N/A N/A Tenderness on Yes Yes N/A Palpation: Wound Preparation: Ulcer Cleansing: Ulcer Cleansing: N/A Rinsed/Irrigated with Rinsed/Irrigated with Saline Saline Topical Anesthetic Topical Anesthetic Applied: Other: lidocaine Applied: Other: lidocaine 4% 4% Treatment Notes Electronic Signature(s) Signed: 08/31/2016 3:25:27 PM By: Christin Fudge MD, FACS Entered By: Christin Fudge on 08/31/2016 15:25:27 [name], [name] ([id number]) -------------------------------------------------------------------------------- [place] Details Patient Name: [name] Date of Service: 08/31/2016 2:30 PM Medical Record Number: [id number] Patient Account Number: [id number] Date of Birth/Sex: Feb 26, 1929 (83 y.o. Female) Treating RN: Montey Hora Primary Care Poetry Cerro: Clayborn Bigness Other Clinician: Referring Alexiss Iturralde: Clayborn Bigness Treating Jahnia Hewes/Extender: Frann Rider in Treatment: 49 Active Inactive ` Abuse / Safety / Falls / Self Care Management Nursing Diagnoses: Impaired physical mobility Potential for falls Goals: Patient will remain injury free Date Initiated: 05/04/2016 Target Resolution Date: 07/25/2016 Goal Status: Active Interventions: Assess fall risk on admission and as needed Notes: ` Orientation to the Wound Care Program Nursing Diagnoses: Knowledge  deficit related to the wound healing center program Goals: Patient/caregiver will verbalize understanding of the [place] Date Initiated: 05/04/2016 Target Resolution Date: 07/25/2016 Goal Status: Active Interventions: Provide education on orientation to the wound center Notes: ` Pain, Acute or Chronic Nursing Diagnoses: Pain, acute or chronic: actual or potential [name], [name] ([id number]) Goals: Patient/caregiver will verbalize adequate pain control between visits Date Initiated: 05/04/2016 Target Resolution Date: 07/25/2016 Goal Status: Active Interventions: Complete pain assessment as per visit requirements Notes: ` Wound/Skin Impairment Nursing Diagnoses: Impaired tissue integrity Goals: Patient/caregiver will verbalize understanding of skin care regimen Date Initiated: 05/04/2016 Target Resolution Date: 07/25/2016 Goal Status: Active Ulcer/skin breakdown will have a volume reduction of 30% by week 4 Date Initiated: 05/04/2016 Target Resolution Date: 07/25/2016 Goal Status: Active Ulcer/skin breakdown will have a volume reduction of 50% by week 8 Date Initiated: 05/04/2016 Target Resolution Date: 08/22/2016 Goal Status: Active Ulcer/skin breakdown will have a volume reduction of 80% by week 12 Date Initiated: 05/04/2016 Target Resolution Date: 08/22/2016 Goal Status: Active Ulcer/skin breakdown will heal within 14 weeks Date Initiated: 05/04/2016 Target Resolution Date: 09/05/2016 Goal Status: Active Interventions: Assess patient/caregiver ability to obtain necessary supplies Assess patient/caregiver ability to perform ulcer/skin care regimen upon admission and as needed  Assess ulceration(s) every visit Notes: Electronic Signature(s) Signed: 09/04/2016 9:41:00 AM By: Montey Hora Entered By: Montey Hora on 08/31/2016 15:13:06 [name] ([id number]) -------------------------------------------------------------------------------- Pain  Assessment Details Patient Name: [name] Date of Service: 08/31/2016 2:30 PM Medical Record Number: [id number] Patient Account Number: [id number] Date of Birth/Sex: Jun 17, 1928 (83 y.o. Female) Treating RN: Montey Hora Primary Care Haydn Cush: Clayborn Bigness Other Clinician: Referring Almer Bushey: Clayborn Bigness Treating Kristiann Noyce/Extender: Frann Rider in Treatment: 17 Active Problems Location of Pain Severity and Description of Pain Patient Has Paino Yes Site Locations Pain Location: Pain in Ulcers With Dressing Change: Yes Duration of the Pain. Constant / Intermittento Constant Pain Management and Medication Current Pain Management: Notes Topical or injectable lidocaine is offered to patient for acute pain when surgical debridement is performed. If needed, Patient is instructed to use over the counter pain medication for the following 24-48 hours after debridement. Wound care MDs do not prescribed pain medications. Patient has chronic pain or uncontrolled pain. Patient has been instructed to make an appointment with their Primary Care Physician for pain management. Electronic Signature(s) Signed: 09/04/2016 9:41:00 AM By: Montey Hora Entered By: Montey Hora on 08/31/2016 14:39:06 [name] ([id number]) -------------------------------------------------------------------------------- Patient/Caregiver Education Details Patient Name: [name] Date of Service: 08/31/2016 2:30 PM Medical Record Number: [id number] Patient Account Number: [id number] Date of Birth/Gender: 1928-10-26 (83 y.o. Female) Treating RN: Montey Hora Primary Care Physician: Clayborn Bigness Other Clinician: Referring Physician: Clayborn Bigness Treating Physician/Extender: Frann Rider in Treatment: 17 Education Assessment Education Provided To: Patient and Caregiver Education Topics Provided Wound/Skin Impairment: Handouts: Other: wound care as ordered Methods:  Demonstration, Explain/Verbal Responses: State content correctly Electronic Signature(s) Signed: 09/04/2016 9:41:00 AM By: Montey Hora Entered By: Montey Hora on 08/31/2016 16:17:06 [name], [name] ([id number]) -------------------------------------------------------------------------------- Wound Assessment Details Patient Name: [name] Date of Service: 08/31/2016 2:30 PM Medical Record Number: [id number] Patient Account Number: [id number] Date of Birth/Sex: 24-Apr-1929 (83 y.o. Female) Treating RN: Montey Hora Primary Care Nhyira Leano: Clayborn Bigness Other Clinician: Referring Brittanni Cariker: Clayborn Bigness Treating Tyshawn Ciullo/Extender: Frann Rider in Treatment: 17 Wound Status Wound Number: 4 Primary Etiology: Trauma, Other Wound Location: Right Lower Leg - Anterior, Wound Status: Open Proximal Comorbid History: Asthma, Hypertension Wounding Event: Trauma Date Acquired: 04/21/2016 Weeks Of Treatment: 15 Clustered Wound: No Photos Photo Uploaded By: Montey Hora on 08/31/2016 17:01:12 Wound Measurements Length: (cm) 1.1 Width: (cm) 0.2 Depth: (cm) 0.7 Area: (cm) 0.173 Volume: (cm) 0.121 % Reduction in Area: 99.4% % Reduction in Volume: 99.8% Epithelialization: Small (1-33%) Tunneling: No Undermining: No Wound Description Full Thickness Without Exposed Foul Odor Aft Classification: Support Structures Slough/Fibrin Wound Margin: Flat and Intact Exudate Large Amount: Exudate Type: Serous Exudate Color: amber er Cleansing: No o No Wound Bed Granulation Amount: Large (67-100%) Exposed Structure Granulation Quality: Red Fascia Exposed: No [name], [name] ([id number]) Necrotic Amount: Small (1-33%) Fat Layer (Subcutaneous Tissue) Exposed: No Necrotic Quality: Eschar, Adherent Slough Tendon Exposed: No Muscle Exposed: No Joint Exposed: No Bone Exposed: No Limited to Skin Breakdown Periwound Skin Texture Texture Color No Abnormalities Noted:  No No Abnormalities Noted: No Callus: No Atrophie Blanche: No Crepitus: No Cyanosis: No Excoriation: No Ecchymosis: No Induration: No Erythema: Yes Rash: No Erythema Location: Circumferential Scarring: No Hemosiderin Staining: No Mottled: No Moisture Pallor: No No Abnormalities Noted: No Rubor: No Dry / Scaly: No Maceration: No Temperature / Pain Tenderness on Palpation: Yes Wound Preparation Ulcer Cleansing: Rinsed/Irrigated with Saline Topical Anesthetic Applied: Other: lidocaine 4%, Treatment Notes Wound #4 (Right, Proximal, Anterior Lower Leg)  1. Cleansed with: Clean wound with Normal Saline 2. Anesthetic Topical Lidocaine 4% cream to wound bed prior to debridement 4. Dressing Applied: Prisma Ag 5. Secondary Dressing Applied Non-Adherent pad ABD and Kerlix/Conform 7. Secured with Recruitment consultant) Signed: 09/04/2016 9:41:00 AM By: Montey Hora Entered By: Montey Hora on 08/31/2016 14:55:08 [name] ([id number]) -------------------------------------------------------------------------------- Wound Assessment Details Patient Name: [name] Date of Service: 08/31/2016 2:30 PM Medical Record Number: [id number] Patient Account Number: [id number] Date of Birth/Sex: June 18, 1928 (83 y.o. Female) Treating RN: Montey Hora Primary Care Tema Alire: Clayborn Bigness Other Clinician: Referring Tanice Petre: Clayborn Bigness Treating Beckham Buxbaum/Extender: Frann Rider in Treatment: 17 Wound Status Wound Number: 5 Primary Etiology: Trauma, Other Wound Location: Right Lower Leg - Distal Wound Status: Open Wounding Event: Trauma Comorbid History: Asthma, Hypertension Date Acquired: 04/21/2016 Weeks Of Treatment: 6 Clustered Wound: No Photos Photo Uploaded By: Montey Hora on 08/31/2016 17:01:13 Wound Measurements Length: (cm) 1.1 Width: (cm) 0.2 Depth: (cm) 0.4 Area: (cm) 0.173 Volume: (cm) 0.069 % Reduction in Area: 94.8% %  Reduction in Volume: 97.9% Epithelialization: Small (1-33%) Tunneling: No Undermining: No Wound Description Full Thickness Without Exposed Foul Odor Aft Classification: Support Structures Slough/Fibrin Wound Margin: Flat and Intact Exudate Large Amount: Exudate Type: Serous Exudate Color: amber er Cleansing: No o No Wound Bed Granulation Amount: Large (67-100%) Exposed Structure Granulation Quality: Red, Pink Fascia Exposed: No Necrotic Amount: Small (1-33%) Fat Layer (Subcutaneous Tissue) Exposed: No [name], [name] ([id number]) Necrotic Quality: Adherent Slough Tendon Exposed: No Muscle Exposed: No Joint Exposed: No Bone Exposed: No Limited to Skin Breakdown Periwound Skin Texture Texture Color No Abnormalities Noted: No No Abnormalities Noted: No Callus: No Atrophie Blanche: No Crepitus: No Cyanosis: No Excoriation: No Ecchymosis: No Induration: No Erythema: No Rash: No Hemosiderin Staining: No Scarring: No Mottled: No Pallor: No Moisture Rubor: No No Abnormalities Noted: No Dry / Scaly: No Temperature / Pain Maceration: No Tenderness on Palpation: Yes Wound Preparation Ulcer Cleansing: Rinsed/Irrigated with Saline Topical Anesthetic Applied: Other: lidocaine 4%, Treatment Notes Wound #5 (Right, Distal Lower Leg) 1. Cleansed with: Clean wound with Normal Saline 2. Anesthetic Topical Lidocaine 4% cream to wound bed prior to debridement 4. Dressing Applied: Prisma Ag 5. Secondary Dressing Applied Non-Adherent pad ABD and Kerlix/Conform 7. Secured with Recruitment consultant) Signed: 09/04/2016 9:41:00 AM By: Montey Hora Entered By: Montey Hora on 08/31/2016 14:55:20 [name] ([id number]) -------------------------------------------------------------------------------- Vitals Details Patient Name: [name] Date of Service: 08/31/2016 2:30 PM Medical Record Number: [id number] Patient Account Number: [id number] Date  of Birth/Sex: [birth year] (83 y.o. Female) Treating RN: Montey Hora Primary Care Joan Herschberger: Clayborn Bigness Other Clinician: Referring Estiben Mizuno: Clayborn Bigness Treating Gardy Montanari/Extender: Frann Rider in Treatment: 17 Vital Signs Time Taken: 14:39 Temperature (F): 98.1 Height (in): 60 Pulse (bpm): 78 Weight (lbs): 122 Respiratory Rate (breaths/min): 18 Body Mass Index (BMI): 23.8 Blood Pressure (mmHg): 149/72 Reference Range: 80 - 120 mg / dl Electronic Signature(s) Signed: 09/04/2016 9:41:00 AM By: Montey Hora Entered By: Montey Hora on 08/31/2016 14:44:41

## 2016-09-08 ENCOUNTER — Encounter: Payer: Medicare Other | Admitting: Surgery

## 2016-09-08 DIAGNOSIS — C44722 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, including hip: Secondary | ICD-10-CM | POA: Diagnosis not present

## 2016-09-10 NOTE — Progress Notes (Signed)
ENOLA, SIEBERS ([id number]) Visit Report for 09/08/2016 Arrival Information Details Patient Name: [name], [name] Date of Service: 09/08/2016 2:30 PM Medical Record Number: [id number] Patient Account Number: [id number] Date of Birth/Sex: Oct 06, 1928 (83 y.o. Female) Treating RN: Cornell Barman Primary Care Primitivo Merkey: Clayborn Bigness Other Clinician: Referring Gail Creekmore: Clayborn Bigness Treating Lamis Behrmann/Extender: Frann Rider in Treatment: 18 Visit Information History Since Last Visit Added or deleted any medications: No Patient Arrived: Ambulatory Any new allergies or adverse reactions: No Arrival Time: 14:30 Had a fall or experienced change in No Accompanied By: daughter activities of daily living that may affect Transfer Assistance: None risk of falls: Patient Identification Verified: Yes Signs or symptoms of abuse/neglect since last No Secondary Verification Process Yes visito Completed: Hospitalized since last visit: No Patient Requires Transmission-Based No Has Dressing in Place as Prescribed: Yes Precautions: Pain Present Now: No Patient Has Alerts: No Electronic Signature(s) Signed: 09/08/2016 4:36:54 PM By: Gretta Cool, RN, BSN, Kim RN, BSN Entered By: Gretta Cool, RN, BSN, Kim on 09/08/2016 14:31:19 [name] ([id number]) -------------------------------------------------------------------------------- Clinic Level of Care Assessment Details Patient Name: [name] Date of Service: 09/08/2016 2:30 PM Medical Record Number: [id number] Patient Account Number: [id number] Date of Birth/Sex: Jan 30, 1929 (83 y.o. Female) Treating RN: Cornell Barman Primary Care Ketsia Linebaugh: Clayborn Bigness Other Clinician: Referring Halston Kintz: Clayborn Bigness Treating Penda Venturi/Extender: Frann Rider in Treatment: 18 Clinic Level of Care Assessment Items TOOL 4 Quantity Score []  - Use when only an EandM is performed on FOLLOW-UP visit 0 ASSESSMENTS - Nursing Assessment / Reassessment []  -  Reassessment of Co-morbidities (includes updates in patient status) 0 X - Reassessment of Adherence to Treatment Plan 1 5 ASSESSMENTS - Wound and Skin Assessment / Reassessment []  - Simple Wound Assessment / Reassessment - one wound 0 X - Complex Wound Assessment / Reassessment - multiple wounds 1 5 []  - Dermatologic / Skin Assessment (not related to wound area) 0 ASSESSMENTS - Focused Assessment []  - Circumferential Edema Measurements - multi extremities 0 []  - Nutritional Assessment / Counseling / Intervention 0 []  - Lower Extremity Assessment (monofilament, tuning fork, pulses) 0 []  - Peripheral Arterial Disease Assessment (using hand held doppler) 0 ASSESSMENTS - Ostomy and/or Continence Assessment and Care []  - Incontinence Assessment and Management 0 []  - Ostomy Care Assessment and Management (repouching, etc.) 0 PROCESS - Coordination of Care X - Simple Patient / Family Education for ongoing care 1 15 []  - Complex (extensive) Patient / Family Education for ongoing care 0 []  - Staff obtains Programmer, systems, Records, Test Results / Process Orders 0 []  - Staff telephones HHA, Nursing Homes / Clarify orders / etc 0 []  - Routine Transfer to another Facility (non-emergent condition) 0 [name], [name] ([id number]) []  - Routine Hospital Admission (non-emergent condition) 0 []  - New Admissions / Biomedical engineer / Ordering NPWT, Apligraf, etc. 0 []  - Emergency Hospital Admission (emergent condition) 0 []  - Simple Discharge Coordination 0 []  - Complex (extensive) Discharge Coordination 0 PROCESS - Special Needs []  - Pediatric / Minor Patient Management 0 []  - Isolation Patient Management 0 []  - Hearing / Language / Visual special needs 0 []  - Assessment of Community assistance (transportation, D/C planning, etc.) 0 []  - Additional assistance / Altered mentation 0 []  - Support Surface(s) Assessment (bed, cushion, seat, etc.) 0 INTERVENTIONS - Wound Cleansing / Measurement []  - Simple  Wound Cleansing - one wound 0 X - Complex Wound Cleansing - multiple wounds 1 5 X - Wound Imaging (photographs - any number of wounds) 1 5 []  - Wound Tracing (  instead of photographs) 0 []  - Simple Wound Measurement - one wound 0 []  - Complex Wound Measurement - multiple wounds 0 INTERVENTIONS - Wound Dressings []  - Small Wound Dressing one or multiple wounds 0 X - Medium Wound Dressing one or multiple wounds 1 15 []  - Large Wound Dressing one or multiple wounds 0 []  - Application of Medications - topical 0 []  - Application of Medications - injection 0 INTERVENTIONS - Miscellaneous []  - External ear exam 0 [name], [name] ([id number]) []  - Specimen Collection (cultures, biopsies, blood, body fluids, etc.) 0 []  - Specimen(s) / Culture(s) sent or taken to Lab for analysis 0 []  - Patient Transfer (multiple staff / Harrel Lemon Lift / Similar devices) 0 []  - Simple Staple / Suture removal (25 or less) 0 []  - Complex Staple / Suture removal (26 or more) 0 []  - Hypo / Hyperglycemic Management (close monitor of Blood Glucose) 0 []  - Ankle / Brachial Index (ABI) - do not check if billed separately 0 X - Vital Signs 1 5 Has the patient been seen at the hospital within the last three years: Yes Total Score: 55 Level Of Care: New/Established - Level 2 Electronic Signature(s) Signed: 09/08/2016 4:36:54 PM By: Gretta Cool, RN, BSN, Kim RN, BSN Entered By: Gretta Cool, RN, BSN, Kim on 09/08/2016 15:08:26 [name] ([id number]) -------------------------------------------------------------------------------- Complex / Palliative Patient Assessment Details Patient Name: [name] Date of Service: 09/08/2016 2:30 PM Medical Record Number: [id number] Patient Account Number: [id number] Date of Birth/Sex: 1929/03/03 (83 y.o. Female) Treating RN: Cornell Barman Primary Care Madelene Kaatz: Clayborn Bigness Other Clinician: Referring Teion Ballin: Clayborn Bigness Treating Alyiah Ulloa/Extender: Frann Rider in Treatment:  18 Palliative Management Criteria Complex Wound Management Criteria Patient has remarkable or complex co-morbidities requiring medications or treatments that extend wound healing times. Examples: o Diabetes mellitus with chronic renal failure or end stage renal disease requiring dialysis o Advanced or poorly controlled rheumatoid arthritis o Diabetes mellitus and end stage chronic obstructive pulmonary disease o Active cancer with current chemo- or radiation therapy re-occurring squamous cell carcinoma Care Approach Wound Care Plan: Complex Wound Management Electronic Signature(s) Signed: 09/08/2016 11:10:48 AM By: Gretta Cool, RN, BSN, Kim RN, BSN Signed: 09/08/2016 4:08:29 PM By: Christin Fudge MD, FACS Entered By: Gretta Cool RN, BSN, Kim on 09/08/2016 11:10:48 [name] ([id number]) -------------------------------------------------------------------------------- Encounter Discharge Information Details Patient Name: [name] Date of Service: 09/08/2016 2:30 PM Medical Record Number: [id number] Patient Account Number: [id number] Date of Birth/Sex: 04-07-1929 (83 y.o. Female) Treating RN: Montey Hora Primary Care Love Milbourne: Clayborn Bigness Other Clinician: Referring Dashanna Kinnamon: Clayborn Bigness Treating Edouard Gikas/Extender: Frann Rider in Treatment: 3 Encounter Discharge Information Items Discharge Pain Level: 0 Discharge Condition: Stable Ambulatory Status: Ambulatory Discharge Destination: Home Transportation: Private Auto Accompanied By: daughter Schedule Follow-up Appointment: Yes Medication Reconciliation completed and provided to Patient/Care Yes Irlene Crudup: Provided on Clinical Summary of Care: 09/08/2016 Form Type Recipient Paper Patient LP Electronic Signature(s) Signed: 09/08/2016 4:36:54 PM By: Gretta Cool RN, BSN, Kim RN, BSN Previous Signature: 09/08/2016 3:03:59 PM Version By: Ruthine Dose Entered By: Gretta Cool RN, BSN, Kim on 09/08/2016  15:09:48 [name] ([id number]) -------------------------------------------------------------------------------- Lower Extremity Assessment Details Patient Name: [name] Date of Service: 09/08/2016 2:30 PM Medical Record Number: [id number] Patient Account Number: [id number] Date of Birth/Sex: 1929/02/28 (83 y.o. Female) Treating RN: Cornell Barman Primary Care Evamaria Detore: Clayborn Bigness Other Clinician: Referring Akita Maxim: Clayborn Bigness Treating Dawud Mays/Extender: Frann Rider in Treatment: 9 Vascular Assessment Pulses: Dorsalis Pedis Palpable: [Right:Yes] Posterior Tibial Extremity colors, hair growth, and conditions: Hair Growth on Extremity: [  Right:No] Temperature of Extremity: [Right:Warm] Capillary Refill: [Right:< 3 seconds] Toe Nail Assessment Left: Right: Thick: No Discolored: No Deformed: No Improper Length and Hygiene: No Notes Patient has multiple skin cancers and sores all over her lower legs. Electronic Signature(s) Signed: 09/08/2016 4:36:54 PM By: Gretta Cool RN, BSN, Kim RN, BSN Entered By: Gretta Cool, RN, BSN, Kim on 09/08/2016 14:42:54 [name], [name] ([id number]) -------------------------------------------------------------------------------- Multi Wound Chart Details Patient Name: [name] Date of Service: 09/08/2016 2:30 PM Medical Record Number: [id number] Patient Account Number: [id number] Date of Birth/Sex: [birth year] (83 y.o. Female) Treating RN: Cornell Barman Primary Care Phillip Maffei: Clayborn Bigness Other Clinician: Referring Rosan Calbert: Clayborn Bigness Treating Carlen Fils/Extender: Frann Rider in Treatment: 18 Vital Signs Height(in): 60 Pulse(bpm): 79 Weight(lbs): 122 Blood Pressure 166/63 (mmHg): Body Mass Index(BMI): 24 Temperature(F): 97.5 Respiratory Rate 16 (breaths/min): Photos: [4:No Photos] [5:No Photos] [N/A:N/A] Wound Location: [4:Right, Proximal, Anterior Lower Leg] [5:Right, Distal Lower Leg] [N/A:N/A] Wounding  Event: [4:Trauma] [5:Trauma] [N/A:N/A] Primary Etiology: [4:Trauma, Other] [5:Trauma, Other] [N/A:N/A] Date Acquired: [4:04/21/2016] [5:04/21/2016] [N/A:N/A] Weeks of Treatment: [4:16] [5:7] [N/A:N/A] Wound Status: [4:Open] [5:Open] [N/A:N/A] Measurements L x W x D 0.9x0.3x0.5 [5:1.1x0.1x0.3] [N/A:N/A] (cm) Area (cm) : [4:0.212] [5:0.086] [N/A:N/A] Volume (cm) : [4:0.106] [5:0.026] [N/A:N/A] % Reduction in Area: [4:99.30%] [5:97.40%] [N/A:N/A] % Reduction in Volume: 99.80% [5:99.20%] [N/A:N/A] Classification: [4:Full Thickness Without Exposed Support Structures] [5:Full Thickness Without Exposed Support Structures] [N/A:N/A] Periwound Skin Texture: No Abnormalities Noted [5:No Abnormalities Noted] [N/A:N/A] Periwound Skin [4:No Abnormalities Noted] [5:No Abnormalities Noted] [N/A:N/A] Moisture: Periwound Skin Color: No Abnormalities Noted [5:No Abnormalities Noted] [N/A:N/A] Tenderness on [4:No] [5:No] [N/A:N/A] Treatment Notes Electronic Signature(s) Signed: 09/08/2016 3:06:26 PM By: Christin Fudge MD, FACS [place], [place]ast[place]ia ([id number]) Entered By: Christin Fudge on 09/08/2016 15:06:26 [name] ([id number]) -------------------------------------------------------------------------------- Multi-Disciplinary Care Plan Details Patient Name: [name] Date of Service: 09/08/2016 2:30 PM Medical Record Number: [id number] Patient Account Number: [id number] Date of Birth/Sex: 08/19/1928 (83 y.o. Female) Treating RN: Cornell Barman Primary Care Konstantine Gervasi: Clayborn Bigness Other Clinician: Referring Velmer Broadfoot: Clayborn Bigness Treating Sammye Staff/Extender: Frann Rider in Treatment: 26 Active Inactive ` Abuse / Safety / Falls / Self Care Management Nursing Diagnoses: Impaired physical mobility Potential for falls Goals: Patient will remain injury free Date Initiated: 05/04/2016 Target Resolution Date: 07/25/2016 Goal Status: Active Interventions: Assess fall risk on  admission and as needed Notes: ` Orientation to the Wound Care Program Nursing Diagnoses: Knowledge deficit related to the wound healing center program Goals: Patient/caregiver will verbalize understanding of the [place] Date Initiated: 05/04/2016 Target Resolution Date: 07/25/2016 Goal Status: Active Interventions: Provide education on orientation to the wound center Notes: ` Pain, Acute or Chronic Nursing Diagnoses: Pain, acute or chronic: actual or potential NAWAAL, ALLING ([id number]) Goals: Patient/caregiver will verbalize adequate pain control between visits Date Initiated: 05/04/2016 Target Resolution Date: 07/25/2016 Goal Status: Active Interventions: Complete pain assessment as per visit requirements Notes: ` Wound/Skin Impairment Nursing Diagnoses: Impaired tissue integrity Goals: Patient/caregiver will verbalize understanding of skin care regimen Date Initiated: 05/04/2016 Target Resolution Date: 07/25/2016 Goal Status: Active Ulcer/skin breakdown will have a volume reduction of 30% by week 4 Date Initiated: 05/04/2016 Target Resolution Date: 07/25/2016 Goal Status: Active Ulcer/skin breakdown will have a volume reduction of 50% by week 8 Date Initiated: 05/04/2016 Target Resolution Date: 08/22/2016 Goal Status: Active Ulcer/skin breakdown will have a volume reduction of 80% by week 12 Date Initiated: 05/04/2016 Target Resolution Date: 08/22/2016 Goal Status: Active Ulcer/skin breakdown will heal within 14 weeks Date Initiated: 05/04/2016 Target Resolution Date: 09/05/2016 Goal Status:  Active Interventions: Assess patient/caregiver ability to obtain necessary supplies Assess patient/caregiver ability to perform ulcer/skin care regimen upon admission and as needed Assess ulceration(s) every visit Notes: Electronic Signature(s) Signed: 09/08/2016 4:36:54 PM By: Gretta Cool, RN, BSN, Kim RN, BSN Entered By: Gretta Cool, RN, BSN, Kim on 09/08/2016  14:47:44 [name], [name] ([id number]) -------------------------------------------------------------------------------- Pain Assessment Details Patient Name: [name] Date of Service: 09/08/2016 2:30 PM Medical Record Number: [id number] Patient Account Number: [id number] Date of Birth/Sex: [birth year] (83 y.o. Female) Treating RN: Cornell Barman Primary Care Columbia Pandey: Clayborn Bigness Other Clinician: Referring Dewan Emond: Clayborn Bigness Treating Menucha Dicesare/Extender: Frann Rider in Treatment: 83 Active Problems Location of Pain Severity and Description of Pain Patient Has Paino No Site Locations With Dressing Change: No Pain Management and Medication Current Pain Management: Notes Topical or injectable lidocaine is offered to patient for acute pain when surgical debridement is performed. If needed, Patient is instructed to use over the counter pain medication for the following 24-48 hours after debridement. Wound care MDs do not prescribed pain medications. Patient has chronic pain or uncontrolled pain. Patient has been instructed to make an appointment with their Primary Care Physician for pain management. Electronic Signature(s) Signed: 09/08/2016 4:36:54 PM By: Gretta Cool, RN, BSN, Kim RN, BSN Entered By: Gretta Cool, RN, BSN, Kim on 09/08/2016 14:31:35 [name], [name] ([id number]) -------------------------------------------------------------------------------- Patient/Caregiver Education Details Patient Name: [name] Date of Service: 09/08/2016 2:30 PM Medical Record Number: [id number] Patient Account Number: [id number] Date of Birth/Gender: 12/21/1928 (83 y.o. Female) Treating RN: Cornell Barman Primary Care Physician: Clayborn Bigness Other Clinician: Referring Physician: Clayborn Bigness Treating Physician/Extender: Frann Rider in Treatment: 21 Education Assessment Education Provided To: Patient Education Topics Provided Wound/Skin Impairment: Handouts: Caring for Your  Ulcer, Other: wound care as prescribed Methods: Demonstration, Explain/Verbal Responses: State content correctly Electronic Signature(s) Signed: 09/08/2016 4:36:54 PM By: Gretta Cool, RN, BSN, Kim RN, BSN Entered By: Gretta Cool, RN, BSN, Kim on 09/08/2016 15:10:04 [name] ([id number]) -------------------------------------------------------------------------------- Wound Assessment Details Patient Name: [name] Date of Service: 09/08/2016 2:30 PM Medical Record Number: [id number] Patient Account Number: [id number] Date of Birth/Sex: [birth year] (83 y.o. Female) Treating RN: Cornell Barman Primary Care Shiela Bruns: Clayborn Bigness Other Clinician: Referring Traven Davids: Clayborn Bigness Treating Cort Dragoo/Extender: Frann Rider in Treatment: 18 Wound Status Wound Number: 4 Primary Etiology: Trauma, Other Wound Location: Right, Proximal, Anterior Lower Wound Status: Open Leg Wounding Event: Trauma Date Acquired: 04/21/2016 Weeks Of Treatment: 16 Clustered Wound: No Photos Photo Uploaded By: Gretta Cool, RN, BSN, Kim on 09/08/2016 15:11:18 Wound Measurements Length: (cm) 0.9 Width: (cm) 0.3 Depth: (cm) 0.5 Area: (cm) 0.212 Volume: (cm) 0.106 % Reduction in Area: 99.3% % Reduction in Volume: 99.8% Wound Description Full Thickness Without Exposed Classification: Support Structures Periwound Skin Texture Texture Color No Abnormalities Noted: No No Abnormalities Noted: No Moisture No Abnormalities Noted: No Treatment Notes Wound #4 (Right, Proximal, Anterior Lower Leg) 1. Cleansed with: [name], [name] ([id number]) Clean wound with Normal Saline 2. Anesthetic Topical Lidocaine 4% cream to wound bed prior to debridement 4. Dressing Applied: Prisma Ag 5. [place] ABD and Kerlix/Conform Electronic Signature(s) Signed: 09/08/2016 4:36:54 PM By: Gretta Cool, RN, BSN, Kim RN, BSN Entered By: Gretta Cool, RN, BSN, Kim on 09/08/2016 14:41:07 [name], [name] ([id number]) -------------------------------------------------------------------------------- Wound Assessment Details Patient Name: [name] Date of Service: 09/08/2016 2:30 PM Medical Record Number: [id number] Patient Account Number: [id number] Date of Birth/Sex: 1928-07-31 (83 y.o. Female) Treating RN: Cornell Barman Primary Care Jocelynn Gioffre: Clayborn Bigness Other Clinician: Referring Tauheedah Bok: Clayborn Bigness Treating Zaki Gertsch/Extender: Frann Rider  in Treatment: 18 Wound Status Wound Number: 5 Primary Etiology: Trauma, Other Wound Location: Right, Distal Lower Leg Wound Status: Open Wounding Event: Trauma Date Acquired: 04/21/2016 Weeks Of Treatment: 7 Clustered Wound: No Photos Photo Uploaded By: Gretta Cool, RN, BSN, Kim on 09/08/2016 15:11:18 Wound Measurements Length: (cm) 1.1 Width: (cm) 0.1 Depth: (cm) 0.3 Area: (cm) 0.086 Volume: (cm) 0.026 % Reduction in Area: 97.4% % Reduction in Volume: 99.2% Wound Description Full Thickness Without Exposed Classification: Support Structures Periwound Skin Texture Texture Color No Abnormalities Noted: No No Abnormalities Noted: No Moisture No Abnormalities Noted: No Treatment Notes Wound #5 (Right, Distal Lower Leg) 1. Cleansed with: Clean wound with Normal Saline [name], [name] ([id number]) 2. Anesthetic Topical Lidocaine 4% cream to wound bed prior to debridement 4. Dressing Applied: Prisma Ag 5. [place] ABD and Kerlix/Conform Electronic Signature(s) Signed: 09/08/2016 4:36:54 PM By: Gretta Cool, RN, BSN, Kim RN, BSN Entered By: Gretta Cool, RN, BSN, Kim on 09/08/2016 14:41:07 [name], [name] ([id number]) -------------------------------------------------------------------------------- Vitals Details Patient Name: [name] Date of Service: 09/08/2016 2:30 PM Medical Record Number: [id number] Patient Account Number: [id number] Date of Birth/Sex: [birth year] (83 y.o.  Female) Treating RN: Cornell Barman Primary Care Hong Timm: Clayborn Bigness Other Clinician: Referring Mialynn Shelvin: Clayborn Bigness Treating Rexanna Louthan/Extender: Frann Rider in Treatment: 18 Vital Signs Time Taken: 14:31 Temperature (F): 97.5 Height (in): 60 Pulse (bpm): 79 Weight (lbs): 122 Respiratory Rate (breaths/min): 16 Body Mass Index (BMI): 23.8 Blood Pressure (mmHg): 166/63 Reference Range: 80 - 120 mg / dl Electronic Signature(s) Signed: 09/08/2016 4:36:54 PM By: Gretta Cool, RN, BSN, Kim RN, BSN Entered By: Gretta Cool, RN, BSN, Kim on 09/08/2016 14:31:57
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
## 2016-09-14 ENCOUNTER — Encounter: Payer: Medicare Other | Admitting: Surgery

## 2016-09-14 DIAGNOSIS — C44722 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, including hip: Secondary | ICD-10-CM | POA: Diagnosis not present
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
## 2016-09-16 NOTE — Progress Notes (Signed)
ANIAYAH, ALANIZ ([id number]) Visit Report for 09/14/2016 Arrival Information Details Patient Name: LERONDA, LEWERS Date of Service: 09/14/2016 1:30 PM Medical Record Number: [id number] Patient Account Number: [id number] Date of Birth/Sex: Oct 26, 1928 (83 y.o. Female) Treating RN: Montey Hora Primary Care Rafiel Mecca: Clayborn Bigness Other Clinician: Referring Adriann Ballweg: Clayborn Bigness Treating Juel Bellerose/Extender: Frann Rider in Treatment: 101 Visit Information History Since Last Visit Added or deleted any medications: No Patient Arrived: Ambulatory Any new allergies or adverse reactions: No Arrival Time: 13:45 Had a fall or experienced change in No Accompanied By: dtr activities of daily living that may affect Transfer Assistance: None risk of falls: Patient Identification Verified: Yes Signs or symptoms of abuse/neglect since last No Secondary Verification Process Yes visito Completed: Hospitalized since last visit: No Patient Requires Transmission-Based No Has Dressing in Place as Prescribed: Yes Precautions: Pain Present Now: Yes Patient Has Alerts: No Electronic Signature(s) Signed: 09/14/2016 5:47:22 PM By: Montey Hora Entered By: Montey Hora on 09/14/2016 Streator, [name] ([id number]) -------------------------------------------------------------------------------- Clinic Level of Care Assessment Details Patient Name: [name] Date of Service: 09/14/2016 1:30 PM Medical Record Number: [id number] Patient Account Number: [id number] Date of Birth/Sex: [birth year] (83 y.o. Female) Treating RN: Montey Hora Primary Care Shareta Fishbaugh: Clayborn Bigness Other Clinician: Referring Lama Narayanan: Clayborn Bigness Treating Larkin Morelos/Extender: Frann Rider in Treatment: 19 Clinic Level of Care Assessment Items TOOL 4 Quantity Score []  - Use when only an EandM is performed on FOLLOW-UP visit 0 ASSESSMENTS - Nursing Assessment / Reassessment X - Reassessment of  Co-morbidities (includes updates in patient status) 1 10 X - Reassessment of Adherence to Treatment Plan 1 5 ASSESSMENTS - Wound and Skin Assessment / Reassessment []  - Simple Wound Assessment / Reassessment - one wound 0 X - Complex Wound Assessment / Reassessment - multiple wounds 2 5 []  - Dermatologic / Skin Assessment (not related to wound area) 0 ASSESSMENTS - Focused Assessment []  - Circumferential Edema Measurements - multi extremities 0 []  - Nutritional Assessment / Counseling / Intervention 0 X - Lower Extremity Assessment (monofilament, tuning fork, pulses) 1 5 []  - Peripheral Arterial Disease Assessment (using hand held doppler) 0 ASSESSMENTS - Ostomy and/or Continence Assessment and Care []  - Incontinence Assessment and Management 0 []  - Ostomy Care Assessment and Management (repouching, etc.) 0 PROCESS - Coordination of Care X - Simple Patient / Family Education for ongoing care 1 15 []  - Complex (extensive) Patient / Family Education for ongoing care 0 []  - Staff obtains Programmer, systems, Records, Test Results / Process Orders 0 []  - Staff telephones HHA, Nursing Homes / Clarify orders / etc 0 []  - Routine Transfer to another Facility (non-emergent condition) 0 Hohmann, Antoinetta ([id number]) []  - Routine Hospital Admission (non-emergent condition) 0 []  - New Admissions / Biomedical engineer / Ordering NPWT, Apligraf, etc. 0 []  - Emergency Hospital Admission (emergent condition) 0 X - Simple Discharge Coordination 1 10 []  - Complex (extensive) Discharge Coordination 0 PROCESS - Special Needs []  - Pediatric / Minor Patient Management 0 []  - Isolation Patient Management 0 []  - Hearing / Language / Visual special needs 0 []  - Assessment of Community assistance (transportation, D/C planning, etc.) 0 []  - Additional assistance / Altered mentation 0 []  - Support Surface(s) Assessment (bed, cushion, seat, etc.) 0 INTERVENTIONS - Wound Cleansing / Measurement []  - Simple Wound  Cleansing - one wound 0 X - Complex Wound Cleansing - multiple wounds 2 5 X - Wound Imaging (photographs - any number of wounds) 1 5 []  - Wound Tracing (instead of photographs)  0 []  - Simple Wound Measurement - one wound 0 X - Complex Wound Measurement - multiple wounds 2 5 INTERVENTIONS - Wound Dressings X - Small Wound Dressing one or multiple wounds 2 10 []  - Medium Wound Dressing one or multiple wounds 0 []  - Large Wound Dressing one or multiple wounds 0 []  - Application of Medications - topical 0 []  - Application of Medications - injection 0 INTERVENTIONS - Miscellaneous []  - External ear exam 0 Rowzee, Iliana ([id number]) []  - Specimen Collection (cultures, biopsies, blood, body fluids, etc.) 0 []  - Specimen(s) / Culture(s) sent or taken to Lab for analysis 0 []  - Patient Transfer (multiple staff / Harrel Lemon Lift / Similar devices) 0 []  - Simple Staple / Suture removal (25 or less) 0 []  - Complex Staple / Suture removal (26 or more) 0 []  - Hypo / Hyperglycemic Management (close monitor of Blood Glucose) 0 []  - Ankle / Brachial Index (ABI) - do not check if billed separately 0 X - Vital Signs 1 5 Has the patient been seen at the hospital within the last three years: Yes Total Score: 105 Level Of Care: New/Established - Level 3 Electronic Signature(s) Signed: 09/14/2016 5:47:22 PM By: Montey Hora Entered By: Montey Hora on 09/14/2016 15:06:56 [name] ([id number]) -------------------------------------------------------------------------------- Encounter Discharge Information Details Patient Name: [name] Date of Service: 09/14/2016 1:30 PM Medical Record Number: [id number] Patient Account Number: [id number] Date of Birth/Sex: [birth year] (83 y.o. Female) Treating RN: Montey Hora Primary Care Shylee Durrett: Clayborn Bigness Other Clinician: Referring Charon Smedberg: Clayborn Bigness Treating Steed Kanaan/Extender: Frann Rider in Treatment: 37 Encounter Discharge  Information Items Discharge Pain Level: 0 Discharge Condition: Stable Ambulatory Status: Ambulatory Discharge Destination: Home Transportation: Private Auto Accompanied By: dtr Schedule Follow-up Appointment: Yes Medication Reconciliation completed and provided to Patient/Care No Isidora Laham: Provided on Clinical Summary of Care: 09/14/2016 Form Type Recipient Paper Patient LP Electronic Signature(s) Signed: 09/14/2016 2:34:09 PM By: Ruthine Dose Entered By: Ruthine Dose on 09/14/2016 14:34:09 Harnden, [name] ([id number]) -------------------------------------------------------------------------------- Lower Extremity Assessment Details Patient Name: [name] Date of Service: 09/14/2016 1:30 PM Medical Record Number: [id number] Patient Account Number: [id number] Date of Birth/Sex: 06-25-1928 (83 y.o. Female) Treating RN: Montey Hora Primary Care Philana Younis: Clayborn Bigness Other Clinician: Referring Soriya Worster: Clayborn Bigness Treating Teasha Murrillo/Extender: Frann Rider in Treatment: 32 Vascular Assessment Pulses: Dorsalis Pedis Palpable: [Right:Yes] Posterior Tibial Extremity colors, hair growth, and conditions: Extremity Color: [Right:Hyperpigmented] Hair Growth on Extremity: [Right:No] Temperature of Extremity: [Right:Warm] Capillary Refill: [Right:< 3 seconds] Electronic Signature(s) Signed: 09/14/2016 5:47:22 PM By: Montey Hora Entered By: Montey Hora on 09/14/2016 13:59:59 Dahm, [name] ([id number]) -------------------------------------------------------------------------------- Multi Wound Chart Details Patient Name: [name] Date of Service: 09/14/2016 1:30 PM Medical Record Number: [id number] Patient Account Number: [id number] Date of Birth/Sex: 22-Aug-1928 (83 y.o. Female) Treating RN: Montey Hora Primary Care Siyona Coto: Clayborn Bigness Other Clinician: Referring Izael Bessinger: Clayborn Bigness Treating Tysheem Accardo/Extender: Frann Rider in  Treatment: 19 Vital Signs Height(in): 60 Pulse(bpm): 81 Weight(lbs): 122 Blood Pressure 177/68 (mmHg): Body Mass Index(BMI): 24 Temperature(F): 97.8 Respiratory Rate 16 (breaths/min): Photos: [4:No Photos] [5:No Photos] [N/A:N/A] Wound Location: [4:Right Lower Leg - Anterior, Proximal] [5:Right Lower Leg - Distal] [N/A:N/A] Wounding Event: [4:Trauma] [5:Trauma] [N/A:N/A] Primary Etiology: [4:Trauma, Other] [5:Trauma, Other] [N/A:N/A] Comorbid History: [4:Asthma, Hypertension] [5:Asthma, Hypertension] [N/A:N/A] Date Acquired: [4:04/21/2016] [5:04/21/2016] [N/A:N/A] Weeks of Treatment: [4:17] [5:8] [N/A:N/A] Wound Status: [4:Open] [5:Open] [N/A:N/A] Measurements L x W x D 1.2x0.1x0.5 [5:0.6x0.3x0.3] [N/A:N/A] (cm) Area (cm) : [4:0.094] [5:0.141] [N/A:N/A] Volume (cm) : [4:0.047] [5:0.042] [N/A:N/A] % Reduction in  Area: [4:99.70%] [5:95.70%] [N/A:N/A] % Reduction in Volume: 99.90% [5:98.70%] [N/A:N/A] Classification: [4:Full Thickness Without Exposed Support Structures] [5:Full Thickness Without Exposed Support Structures] [N/A:N/A] Exudate Amount: [4:Large] [5:Large] [N/A:N/A] Exudate Type: [4:Serous] [5:Serous] [N/A:N/A] Exudate Color: [4:amber] [5:amber] [N/A:N/A] Wound Margin: [4:Flat and Intact] [5:Flat and Intact] [N/A:N/A] Granulation Amount: [4:Large (67-100%)] [5:Large (67-100%)] [N/A:N/A] Granulation Quality: [4:Red] [5:Red] [N/A:N/A] Necrotic Amount: [4:Small (1-33%)] [5:Small (1-33%)] [N/A:N/A] Exposed Structures: [4:Fascia: No Fat Layer (Subcutaneous Tissue) Exposed: No Tendon: No Muscle: No] [5:Fascia: No Fat Layer (Subcutaneous Tissue) Exposed: No Tendon: No Muscle: No] [N/A:N/A] Joint: No Joint: No Bone: No Bone: No Limited to Skin Limited to Skin Breakdown Breakdown Epithelialization: None None N/A Periwound Skin Texture: Excoriation: No Excoriation: No N/A Induration: No Induration: No Callus: No Callus: No Crepitus: No Crepitus: No Rash:  No Rash: No Scarring: No Scarring: No Periwound Skin Maceration: No Maceration: No N/A Moisture: Dry/Scaly: No Dry/Scaly: No Periwound Skin Color: Atrophie Blanche: No Atrophie Blanche: No N/A Cyanosis: No Cyanosis: No Ecchymosis: No Ecchymosis: No Erythema: No Erythema: No Hemosiderin Staining: No Hemosiderin Staining: No Mottled: No Mottled: No Pallor: No Pallor: No Rubor: No Rubor: No Temperature: No Abnormality No Abnormality N/A Tenderness on Yes Yes N/A Palpation: Wound Preparation: Ulcer Cleansing: Ulcer Cleansing: N/A Rinsed/Irrigated with Rinsed/Irrigated with Saline Saline Topical Anesthetic Topical Anesthetic Applied: Other: lidocaine Applied: Other: lidocaine 4% 4% Treatment Notes Electronic Signature(s) Signed: 09/14/2016 2:18:44 PM By: Christin Fudge MD, FACS Entered By: Christin Fudge on 09/14/2016 14:18:43 HELAINE, YACKEL ([id number]) -------------------------------------------------------------------------------- [place] Details Patient Name: [name] Date of Service: 09/14/2016 1:30 PM Medical Record Number: [id number] Patient Account Number: [id number] Date of Birth/Sex: 05-Nov-1928 (83 y.o. Female) Treating RN: Montey Hora Primary Care Ana Liaw: Clayborn Bigness Other Clinician: Referring Travers Goodley: Clayborn Bigness Treating Sundee Garland/Extender: Frann Rider in Treatment: 76 Active Inactive ` Abuse / Safety / Falls / Self Care Management Nursing Diagnoses: Impaired physical mobility Potential for falls Goals: Patient will remain injury free Date Initiated: 05/04/2016 Target Resolution Date: 07/25/2016 Goal Status: Active Interventions: Assess fall risk on admission and as needed Notes: ` Orientation to the Wound Care Program Nursing Diagnoses: Knowledge deficit related to the wound healing center program Goals: Patient/caregiver will verbalize understanding of the [place] Date  Initiated: 05/04/2016 Target Resolution Date: 07/25/2016 Goal Status: Active Interventions: Provide education on orientation to the wound center Notes: ` Pain, Acute or Chronic Nursing Diagnoses: Pain, acute or chronic: actual or potential ROSEALIE, REACH ([id number]) Goals: Patient/caregiver will verbalize adequate pain control between visits Date Initiated: 05/04/2016 Target Resolution Date: 07/25/2016 Goal Status: Active Interventions: Complete pain assessment as per visit requirements Notes: ` Wound/Skin Impairment Nursing Diagnoses: Impaired tissue integrity Goals: Patient/caregiver will verbalize understanding of skin care regimen Date Initiated: 05/04/2016 Target Resolution Date: 07/25/2016 Goal Status: Active Ulcer/skin breakdown will have a volume reduction of 30% by week 4 Date Initiated: 05/04/2016 Target Resolution Date: 07/25/2016 Goal Status: Active Ulcer/skin breakdown will have a volume reduction of 50% by week 8 Date Initiated: 05/04/2016 Target Resolution Date: 08/22/2016 Goal Status: Active Ulcer/skin breakdown will have a volume reduction of 80% by week 12 Date Initiated: 05/04/2016 Target Resolution Date: 08/22/2016 Goal Status: Active Ulcer/skin breakdown will heal within 14 weeks Date Initiated: 05/04/2016 Target Resolution Date: 09/05/2016 Goal Status: Active Interventions: Assess patient/caregiver ability to obtain necessary supplies Assess patient/caregiver ability to perform ulcer/skin care regimen upon admission and as needed Assess ulceration(s) every visit Notes: Electronic Signature(s) Signed: 09/14/2016 5:47:22 PM By: Montey Hora Entered By: Montey Hora on  09/14/2016 14:00:36 BRINA, UMEDA ([id number]) -------------------------------------------------------------------------------- Pain Assessment Details Patient Name: DONYE, DAUENHAUER Date of Service: 09/14/2016 1:30 PM Medical Record Number: [id number] Patient Account Number:  [id number] Date of Birth/Sex: 1929-03-19 (83 y.o. Female) Treating RN: Montey Hora Primary Care Corinthian Mizrahi: Clayborn Bigness Other Clinician: Referring Ganon Demasi: Clayborn Bigness Treating Clotine Heiner/Extender: Frann Rider in Treatment: 66 Active Problems Location of Pain Severity and Description of Pain Patient Has Paino Yes Site Locations Pain Management and Medication Current Pain Management: Notes Topical or injectable lidocaine is offered to patient for acute pain when surgical debridement is performed. If needed, Patient is instructed to use over the counter pain medication for the following 24-48 hours after debridement. Wound care MDs do not prescribed pain medications. Patient has chronic pain or uncontrolled pain. Patient has been instructed to make an appointment with their Primary Care Physician for pain management. Electronic Signature(s) Signed: 09/14/2016 5:47:22 PM By: Montey Hora Entered By: Montey Hora on 09/14/2016 13:46:25 [name] ([id number]) -------------------------------------------------------------------------------- Patient/Caregiver Education Details Patient Name: [name] Date of Service: 09/14/2016 1:30 PM Medical Record Number: [id number] Patient Account Number: [id number] Date of Birth/Gender: [birth year] (83 y.o. Female) Treating RN: Montey Hora Primary Care Physician: Clayborn Bigness Other Clinician: Referring Physician: Clayborn Bigness Treating Physician/Extender: Frann Rider in Treatment: 66 Education Assessment Education Provided To: Patient and Caregiver Education Topics Provided Wound/Skin Impairment: Handouts: Other: wound care as ordered Methods: Demonstration, Explain/Verbal Responses: State content correctly Electronic Signature(s) Signed: 09/14/2016 5:47:22 PM By: Montey Hora Entered By: Montey Hora on 09/14/2016 14:11:19 [name]  ([id number]) -------------------------------------------------------------------------------- Wound Assessment Details Patient Name: [name] Date of Service: 09/14/2016 1:30 PM Medical Record Number: [id number] Patient Account Number: [id number] Date of Birth/Sex: 07/08/1928 (83 y.o. Female) Treating RN: Montey Hora Primary Care Madysin Crisp: Clayborn Bigness Other Clinician: Referring Ephram Kornegay: Clayborn Bigness Treating Huber Mathers/Extender: Frann Rider in Treatment: 19 Wound Status Wound Number: 4 Primary Etiology: Trauma, Other Wound Location: Right Lower Leg - Anterior, Wound Status: Open Proximal Comorbid History: Asthma, Hypertension Wounding Event: Trauma Date Acquired: 04/21/2016 Weeks Of Treatment: 17 Clustered Wound: No Photos Photo Uploaded By: Montey Hora on 09/14/2016 16:59:05 Wound Measurements Length: (cm) 1.2 Width: (cm) 0.1 Depth: (cm) 0.5 Area: (cm) 0.094 Volume: (cm) 0.047 % Reduction in Area: 99.7% % Reduction in Volume: 99.9% Epithelialization: None Tunneling: No Undermining: No Wound Description Full Thickness Without Exposed Foul Odor Aft Classification: Support Structures Slough/Fibrin Wound Margin: Flat and Intact Exudate Large Amount: Exudate Type: Serous Exudate Color: amber er Cleansing: No o No Wound Bed Granulation Amount: Large (67-100%) Exposed Structure Granulation Quality: Red Fascia Exposed: No Bann, Lenice ([id number]) Necrotic Amount: Small (1-33%) Fat Layer (Subcutaneous Tissue) Exposed: No Necrotic Quality: Adherent Slough Tendon Exposed: No Muscle Exposed: No Joint Exposed: No Bone Exposed: No Limited to Skin Breakdown Periwound Skin Texture Texture Color No Abnormalities Noted: No No Abnormalities Noted: No Callus: No Atrophie Blanche: No Crepitus: No Cyanosis: No Excoriation: No Ecchymosis: No Induration: No Erythema: No Rash: No Hemosiderin Staining: No Scarring: No Mottled: No Pallor:  No Moisture Rubor: No No Abnormalities Noted: No Dry / Scaly: No Temperature / Pain Maceration: No Temperature: No Abnormality Tenderness on Palpation: Yes Wound Preparation Ulcer Cleansing: Rinsed/Irrigated with Saline Topical Anesthetic Applied: Other: lidocaine 4%, Treatment Notes Wound #4 (Right, Proximal, Anterior Lower Leg) 1. Cleansed with: Clean wound with Normal Saline 2. Anesthetic Topical Lidocaine 4% cream to wound bed prior to debridement 4. Dressing Applied: Prisma Ag 5. Secondary [place] ABD and Kerlix/Conform 7. Secured with Tape  Electronic Signature(s) Signed: 09/14/2016 5:47:22 PM By: Montey Hora Entered By: Montey Hora on 09/14/2016 13:59:00 Stetzer, [name] ([id number]) -------------------------------------------------------------------------------- Wound Assessment Details Patient Name: [name] Date of Service: 09/14/2016 1:30 PM Medical Record Number: [id number] Patient Account Number: [id number] Date of Birth/Sex: 11-14-1928 (83 y.o. Female) Treating RN: Montey Hora Primary Care Traevon Meiring: Clayborn Bigness Other Clinician: Referring Sharetha Newson: Clayborn Bigness Treating Ayinde Swim/Extender: Frann Rider in Treatment: 19 Wound Status Wound Number: 5 Primary Etiology: Trauma, Other Wound Location: Right Lower Leg - Distal Wound Status: Open Wounding Event: Trauma Comorbid History: Asthma, Hypertension Date Acquired: 04/21/2016 Weeks Of Treatment: 8 Clustered Wound: No Photos Photo Uploaded By: Montey Hora on 09/14/2016 16:58:38 Wound Measurements Length: (cm) 0.6 Width: (cm) 0.3 Depth: (cm) 0.3 Area: (cm) 0.141 Volume: (cm) 0.042 % Reduction in Area: 95.7% % Reduction in Volume: 98.7% Epithelialization: None Tunneling: No Undermining: No Wound Description Full Thickness Without Exposed Foul Odor Aft Classification: Support Structures Slough/Fibrin Wound Margin: Flat and  Intact Exudate Large Amount: Exudate Type: Serous Exudate Color: amber er Cleansing: No o No Wound Bed Granulation Amount: Large (67-100%) Exposed Structure Granulation Quality: Red Fascia Exposed: No Necrotic Amount: Small (1-33%) Fat Layer (Subcutaneous Tissue) Exposed: No Montour, Mikenzie ([id number]) Necrotic Quality: Adherent Slough Tendon Exposed: No Muscle Exposed: No Joint Exposed: No Bone Exposed: No Limited to Skin Breakdown Periwound Skin Texture Texture Color No Abnormalities Noted: No No Abnormalities Noted: No Callus: No Atrophie Blanche: No Crepitus: No Cyanosis: No Excoriation: No Ecchymosis: No Induration: No Erythema: No Rash: No Hemosiderin Staining: No Scarring: No Mottled: No Pallor: No Moisture Rubor: No No Abnormalities Noted: No Dry / Scaly: No Temperature / Pain Maceration: No Temperature: No Abnormality Tenderness on Palpation: Yes Wound Preparation Ulcer Cleansing: Rinsed/Irrigated with Saline Topical Anesthetic Applied: Other: lidocaine 4%, Treatment Notes Wound #5 (Right, Distal Lower Leg) 1. Cleansed with: Clean wound with Normal Saline 2. Anesthetic Topical Lidocaine 4% cream to wound bed prior to debridement 4. Dressing Applied: Prisma Ag 5. Secondary [place] ABD and Kerlix/Conform 7. Secured with Recruitment consultant) Signed: 09/14/2016 5:47:22 PM By: Montey Hora Entered By: Montey Hora on 09/14/2016 13:59:39 Gauntt, [name] ([id number]) -------------------------------------------------------------------------------- [place] Details Patient Name: [name] Date of Service: 09/14/2016 1:30 PM Medical Record Number: [id number] Patient Account Number: [id number] Date of Birth/Sex: 06-14-1928 (83 y.o. Female) Treating RN: Montey Hora Primary Care Aimee Timmons: Clayborn Bigness Other Clinician: Referring Trishna Cwik: Clayborn Bigness Treating Haruka Kowaleski/Extender: Frann Rider in  Treatment: 39 Vital Signs Time Taken: 13:46 Temperature (F): 97.8 Height (in): 60 Pulse (bpm): 81 Weight (lbs): 122 Respiratory Rate (breaths/min): 16 Body Mass Index (BMI): 23.8 Blood Pressure (mmHg): 177/68 Reference Range: 80 - 120 mg / dl Electronic Signature(s) Signed: 09/14/2016 5:47:22 PM By: Montey Hora Entered By: Montey Hora on 09/14/2016 13:50:02

## 2016-09-28 ENCOUNTER — Encounter: Payer: Medicare Other | Attending: Surgery | Admitting: Surgery

## 2016-09-28 DIAGNOSIS — S81811A Laceration without foreign body, right lower leg, initial encounter: Secondary | ICD-10-CM | POA: Insufficient documentation

## 2016-09-28 DIAGNOSIS — F419 Anxiety disorder, unspecified: Secondary | ICD-10-CM | POA: Insufficient documentation

## 2016-09-28 DIAGNOSIS — I10 Essential (primary) hypertension: Secondary | ICD-10-CM | POA: Diagnosis not present

## 2016-09-28 DIAGNOSIS — X58XXXA Exposure to other specified factors, initial encounter: Secondary | ICD-10-CM | POA: Diagnosis not present

## 2016-09-28 DIAGNOSIS — I351 Nonrheumatic aortic (valve) insufficiency: Secondary | ICD-10-CM | POA: Diagnosis not present

## 2016-09-28 DIAGNOSIS — C44729 Squamous cell carcinoma of skin of left lower limb, including hip: Secondary | ICD-10-CM | POA: Diagnosis not present

## 2016-09-28 DIAGNOSIS — I739 Peripheral vascular disease, unspecified: Secondary | ICD-10-CM | POA: Insufficient documentation

## 2016-09-28 DIAGNOSIS — C44722 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, including hip: Secondary | ICD-10-CM | POA: Insufficient documentation

## 2016-09-28 DIAGNOSIS — J45909 Unspecified asthma, uncomplicated: Secondary | ICD-10-CM | POA: Diagnosis not present

## 2016-09-28 DIAGNOSIS — R011 Cardiac murmur, unspecified: Secondary | ICD-10-CM | POA: Diagnosis not present

## 2016-09-28 DIAGNOSIS — L97312 Non-pressure chronic ulcer of right ankle with fat layer exposed: Secondary | ICD-10-CM | POA: Insufficient documentation
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
## 2016-09-30 NOTE — Progress Notes (Signed)
[name], [name] ([id number]) Visit Report for 09/28/2016 Arrival Information Details Patient Name: [name], [name] Date of Service: 09/28/2016 2:30 PM Medical Record Number: [id number] Patient Account Number: [id number] Date of Birth/Sex: 1928-07-05 (83 y.o. Female) Treating [name]: [name] Primary Care Grady Mohabir: [name] Other Clinician: Referring [name]: [name] Treating [name]/Extender: [name] in Treatment: 21 Visit Information History Since Last Visit Added or deleted any medications: No Patient Arrived: Ambulatory Any new allergies or adverse reactions: No Arrival Time: 14:42 Had a fall or experienced change in No Accompanied By: daughter activities of daily living that may affect Transfer Assistance: None risk of falls: Patient Identification Verified: Yes Signs or symptoms of abuse/neglect since last No Secondary Verification Process Yes visito Completed: Hospitalized since last visit: No Patient Requires Transmission-Based No Has Dressing in Place as Prescribed: Yes Precautions: Pain Present Now: No Patient Has Alerts: No Electronic Signature(s) Signed: 09/28/2016 5:54:52 PM By: Ashley Cool, [name], BSN, [name], BSN Entered By: Ashley Cool, [name], BSN, Ashley on 09/28/2016 14:44:47 [name] ([id number]) -------------------------------------------------------------------------------- Clinic Level of Care Assessment Details Patient Name: [name] Date of Service: 09/28/2016 2:30 PM Medical Record Number: [id number] Patient Account Number: [id number] Date of Birth/Sex: 1928-06-05 (83 y.o. Female) Treating [name]: [name] Primary Care [name]: [name] Other Clinician: Referring [name]: [name] Treating [name]/Extender: [name] in Treatment: 21 Clinic Level of Care Assessment Items TOOL 4 Quantity Score []  - Use when only an EandM is performed on FOLLOW-UP visit 0 ASSESSMENTS - Nursing Assessment / Reassessment []  -  Reassessment of Co-morbidities (includes updates in patient status) 0 X - Reassessment of Adherence to Treatment Plan 1 5 ASSESSMENTS - Wound and Skin Assessment / Reassessment X - Simple Wound Assessment / Reassessment - one wound 1 5 []  - Complex Wound Assessment / Reassessment - multiple wounds 0 []  - Dermatologic / Skin Assessment (not related to wound area) 0 ASSESSMENTS - Focused Assessment []  - Circumferential Edema Measurements - multi extremities 0 []  - Nutritional Assessment / Counseling / Intervention 0 []  - Lower Extremity Assessment (monofilament, tuning fork, pulses) 0 []  - Peripheral Arterial Disease Assessment (using hand held doppler) 0 ASSESSMENTS - Ostomy and/or Continence Assessment and Care []  - Incontinence Assessment and Management 0 []  - Ostomy Care Assessment and Management (repouching, etc.) 0 PROCESS - Coordination of Care X - Simple Patient / Family Education for ongoing care 1 15 []  - Complex (extensive) Patient / Family Education for ongoing care 0 X - Staff obtains Programmer, systems, Records, Test Results / Process Orders 1 10 []  - Staff telephones HHA, Nursing Homes / Clarify orders / etc 0 []  - Routine Transfer to another Facility (non-emergent condition) 0 [place], [name] ([id number]) []  - Routine Hospital Admission (non-emergent condition) 0 []  - New Admissions / Biomedical engineer / Ordering NPWT, Apligraf, etc. 0 []  - Emergency Hospital Admission (emergent condition) 0 X - Simple Discharge Coordination 1 10 []  - Complex (extensive) Discharge Coordination 0 PROCESS - Special Needs []  - Pediatric / Minor Patient Management 0 []  - Isolation Patient Management 0 []  - Hearing / Language / Visual special needs 0 []  - Assessment of Community assistance (transportation, D/C planning, etc.) 0 []  - Additional assistance / Altered mentation 0 []  - Support Surface(s) Assessment (bed, cushion, seat, etc.) 0 INTERVENTIONS - Wound Cleansing / Measurement X -  Simple Wound Cleansing - one wound 1 5 []  - Complex Wound Cleansing - multiple wounds 0 X - Wound Imaging (photographs - any number of wounds) 1 5 []  -  Wound Tracing (instead of photographs) 0 X - Simple Wound Measurement - one wound 1 5 []  - Complex Wound Measurement - multiple wounds 0 INTERVENTIONS - Wound Dressings []  - Small Wound Dressing one or multiple wounds 0 X - Medium Wound Dressing one or multiple wounds 1 15 []  - Large Wound Dressing one or multiple wounds 0 []  - Application of Medications - topical 0 []  - Application of Medications - injection 0 INTERVENTIONS - Miscellaneous []  - External ear exam 0 [name], [name] ([id number]) []  - Specimen Collection (cultures, biopsies, blood, body fluids, etc.) 0 []  - Specimen(s) / Culture(s) sent or taken to Lab for analysis 0 []  - Patient Transfer (multiple staff / Harrel Lemon Lift / Similar devices) 0 []  - Simple Staple / Suture removal (25 or less) 0 []  - Complex Staple / Suture removal (26 or more) 0 []  - Hypo / Hyperglycemic Management (close monitor of Blood Glucose) 0 []  - Ankle / Brachial Index (ABI) - do not check if billed separately 0 X - Vital Signs 1 5 Has the patient been seen at the hospital within the last three years: Yes Total Score: 80 Level Of Care: New/Established - Level 3 Electronic Signature(s) Signed: 09/28/2016 5:54:52 PM By: Ashley Cool, [name], BSN, [name], BSN Entered By: Ashley Cool, [name], BSN, Ashley on 09/28/2016 15:12:57 [name] ([id number]) -------------------------------------------------------------------------------- Encounter Discharge Information Details Patient Name: [name] Date of Service: 09/28/2016 2:30 PM Medical Record Number: [id number] Patient Account Number: [id number] Date of Birth/Sex: [birth year] (83 y.o. Female) Treating [name]: [name] Primary Care [name]: [name] Other Clinician: Referring [name]: [name] Treating [name]/Extender: [name] in Treatment:  75 Encounter Discharge Information Items Discharge Pain Level: 0 Discharge Condition: Stable Ambulatory Status: Ambulatory Discharge Destination: Home Transportation: Private Auto Accompanied By: daughter, [name] Schedule Follow-up Appointment: Yes Medication Reconciliation completed and provided to Patient/Care Yes Atiana Levier: Provided on Clinical Summary of Care: 09/28/2016 Form Type Recipient Paper Patient LP Electronic Signature(s) Signed: 09/28/2016 5:54:52 PM By: Ashley Cool [name], BSN, [name], BSN Previous Signature: 09/28/2016 3:13:10 PM Version By: Ruthine Dose Entered By: Ashley Cool [name], BSN, Ashley on 09/28/2016 15:14:05 [name] ([id number]) -------------------------------------------------------------------------------- Lower Extremity Assessment Details Patient Name: [name] Date of Service: 09/28/2016 2:30 PM Medical Record Number: [id number] Patient Account Number: [id number] Date of Birth/Sex: [birth year] (83 y.o. Female) Treating [name]: [name] Primary Care Jonia Oakey: [name] Other Clinician: Referring Mattia Osterman: [name] Treating Izick Gasbarro/Extender: [name] in Treatment: 21 Vascular Assessment Pulses: Dorsalis Pedis Palpable: [Right:Yes] Posterior Tibial Extremity colors, hair growth, and conditions: Extremity Color: [Right:Mottled] Hair Growth on Extremity: [Right:No] Temperature of Extremity: [Right:Warm] Capillary Refill: [Right:< 3 seconds] Toe Nail Assessment Left: Right: Thick: Yes Discolored: Yes Deformed: Yes Improper Length and Hygiene: Yes Electronic Signature(s) Signed: 09/28/2016 5:54:52 PM By: Ashley Cool, [name], BSN, [name], BSN Entered By: Ashley Cool, [name], BSN, Ashley on 09/28/2016 14:52:14 [name] ([id number]) -------------------------------------------------------------------------------- Multi Wound Chart Details Patient Name: [name] Date of Service: 09/28/2016 2:30 PM Medical Record Number: [id number] Patient  Account Number: [id number] Date of Birth/Sex: [birth year] (83 y.o. Female) Treating [name]: [name] Primary Care Evetta Renner: [name] Other Clinician: Referring Adilyn Humes: [name] Treating Sheronica Corey/Extender: [name] in Treatment: 21 Vital Signs Height(in): 60 Pulse(bpm): 75 Weight(lbs): 122 Blood Pressure 171/80 (mmHg): Body Mass Index(BMI): 24 Temperature(F): 97.6 Respiratory Rate 16 (breaths/min): Photos: [4:No Photos] [5:No Photos] [N/A:N/A] Wound Location: [4:Right Lower Leg - Anterior, Proximal] [5:Right, Distal Lower Leg] [N/A:N/A] Wounding Event: [4:Trauma] [5:Trauma] [N/A:N/A] Primary Etiology: [4:Trauma, Other] [5:Trauma, Other] [N/A:N/A]  Comorbid History: [4:Asthma, Hypertension] [5:N/A] [N/A:N/A] Date Acquired: [4:04/21/2016] [5:04/21/2016] [N/A:N/A] Weeks of Treatment: [4:19] [5:10] [N/A:N/A] Wound Status: [4:Open] [5:Open] [N/A:N/A] Measurements L x W x D 0.5x0.1x0.4 [5:0.1x0.1x0.1] [N/A:N/A] (cm) Area (cm) : [4:0.039] [5:0.008] [N/A:N/A] Volume (cm) : [4:0.016] [5:0.001] [N/A:N/A] % Reduction in Area: [4:99.90%] [5:99.80%] [N/A:N/A] % Reduction in Volume: 100.00% [5:100.00%] [N/A:N/A] Position 1 (o'clock): 12 Maximum Distance 1 1.5 (cm): Position 2 (o'clock): 6 Maximum Distance 2 0.9 (cm): Tunneling: [4:Yes] [5:N/A] [N/A:N/A] Classification: [4:Full Thickness Without Exposed Support Structures] [5:Full Thickness Without Exposed Support Structures] [N/A:N/A] Exudate Amount: [4:Large] [5:N/A] [N/A:N/A] Exudate Type: [4:Serous] [5:N/A] [N/A:N/A] Exudate Color: [4:amber] [5:N/A] [N/A:N/A] Wound Margin: [4:Flat and Intact] [5:N/A] [N/A:N/A] Granulation Amount: [4:Large (67-100%)] [5:N/A] [N/A:N/A] Granulation Quality: Red N/A N/A Necrotic Amount: Small (1-33%) N/A N/A Exposed Structures: Fascia: No N/A N/A Fat Layer (Subcutaneous Tissue) Exposed: No Tendon: No Muscle: No Joint: No Bone: No Limited to  Skin Breakdown Epithelialization: None N/A N/A Periwound Skin Texture: Excoriation: No No Abnormalities Noted N/A Induration: No Callus: No Crepitus: No Rash: No Scarring: No Periwound Skin Maceration: No No Abnormalities Noted N/A Moisture: Dry/Scaly: No Periwound Skin Color: Atrophie Blanche: No No Abnormalities Noted N/A Cyanosis: No Ecchymosis: No Erythema: No Hemosiderin Staining: No Mottled: No Pallor: No Rubor: No Temperature: No Abnormality N/A N/A Tenderness on Yes No N/A Palpation: Wound Preparation: Ulcer Cleansing: N/A N/A Rinsed/Irrigated with Saline Topical Anesthetic Applied: Other: lidocaine 4% Treatment Notes Electronic Signature(s) Signed: 09/28/2016 3:05:48 PM By: Christin Fudge MD, FACS Entered By: Christin Fudge on 09/28/2016 15:05:48 [name], [name] ([id number]) -------------------------------------------------------------------------------- Excel Details Patient Name: [name] Date of Service: 09/28/2016 2:30 PM Medical Record Number: [id number] Patient Account Number: [id number] Date of Birth/Sex: [birth year] (83 y.o. Female) Treating [name]: [name] Primary Care Hau Sanor: [name] Other Clinician: Referring Mohammedali Bedoy: [name] Treating Nile Dorning/Extender: [name] in Treatment: 21 Active Inactive ` Abuse / Safety / Falls / Self Care Management Nursing Diagnoses: Impaired physical mobility Potential for falls Goals: Patient will remain injury free Date Initiated: 05/04/2016 Target Resolution Date: 07/25/2016 Goal Status: Active Interventions: Assess fall risk on admission and as needed Notes: ` Orientation to the Wound Care Program Nursing Diagnoses: Knowledge deficit related to the wound healing center program Goals: Patient/caregiver will verbalize understanding of the [facility] Date Initiated: 05/04/2016 Target Resolution Date: 07/25/2016 Goal Status:  Active Interventions: Provide education on orientation to the wound center Notes: ` Pain, Acute or Chronic Nursing Diagnoses: Pain, acute or chronic: actual or potential [name], [name] ([id number]) Goals: Patient/caregiver will verbalize adequate pain control between visits Date Initiated: 05/04/2016 Target Resolution Date: 07/25/2016 Goal Status: Active Interventions: Complete pain assessment as per visit requirements Notes: ` Wound/Skin Impairment Nursing Diagnoses: Impaired tissue integrity Goals: Patient/caregiver will verbalize understanding of skin care regimen Date Initiated: 05/04/2016 Target Resolution Date: 07/25/2016 Goal Status: Active Ulcer/skin breakdown will have a volume reduction of 30% by week 4 Date Initiated: 05/04/2016 Target Resolution Date: 07/25/2016 Goal Status: Active Ulcer/skin breakdown will have a volume reduction of 50% by week 8 Date Initiated: 05/04/2016 Target Resolution Date: 08/22/2016 Goal Status: Active Ulcer/skin breakdown will have a volume reduction of 80% by week 12 Date Initiated: 05/04/2016 Target Resolution Date: 08/22/2016 Goal Status: Active Ulcer/skin breakdown will heal within 14 weeks Date Initiated: 05/04/2016 Target Resolution Date: 09/05/2016 Goal Status: Active Interventions: Assess patient/caregiver ability to obtain necessary supplies Assess patient/caregiver ability to perform ulcer/skin care regimen upon admission and as needed Assess ulceration(s) every visit Notes: Electronic Signature(s) Signed: 09/28/2016  5:54:52 PM By: Ashley Cool, [name], BSN, [name], BSN Entered By: Ashley Cool, [name], BSN, Ashley on 09/28/2016 14:52:21 [name], [name] ([id number]) -------------------------------------------------------------------------------- Pain Assessment Details Patient Name: [name] Date of Service: 09/28/2016 2:30 PM Medical Record Number: [id number] Patient Account Number: [id number] Date of Birth/Sex: 04-Jun-1928 (83 y.o.  Female) Treating [name]: [name] Primary Care Viola Placeres: [name] Other Clinician: Referring Lasheka Kempner: [name] Treating Neita Landrigan/Extender: [name] in Treatment: 21 Active Problems Location of Pain Severity and Description of Pain Patient Has Paino No Site Locations With Dressing Change: No Pain Management and Medication Current Pain Management: Electronic Signature(s) Signed: 09/28/2016 5:54:52 PM By: Ashley Cool, [name], BSN, [name], BSN Entered By: Ashley Cool, [name], BSN, Ashley on 09/28/2016 14:44:57 [name] ([id number]) -------------------------------------------------------------------------------- Patient/Caregiver Education Details Patient Name: [name] Date of Service: 09/28/2016 2:30 PM Medical Record Number: [id number] Patient Account Number: [id number] Date of Birth/Gender: 1928/11/24 (83 y.o. Female) Treating [name]: [name] Primary Care Physician: [name] Other Clinician: Referring Physician: Clayborn [name] Treating Physician/Extender: [name] in Treatment: 21 Education Assessment Education Provided To: Patient Education Topics Provided Wound/Skin Impairment: Handouts: Caring for Your Ulcer Methods: Demonstration Responses: State content correctly Electronic Signature(s) Signed: 09/28/2016 5:54:52 PM By: Ashley Cool, [name], BSN, [name], BSN Entered By: Ashley Cool, [name], BSN, Ashley on 09/28/2016 15:14:16 [name] ([id number]) -------------------------------------------------------------------------------- Wound Assessment Details Patient Name: [name] Date of Service: 09/28/2016 2:30 PM Medical Record Number: [id number] Patient Account Number: [id number] Date of Birth/Sex: 10-Sep-1928 (83 y.o. Female) Treating [name]: [name] Primary Care Jiyan Walkowski: [name] Other Clinician: Referring Sharlyn Odonnel: [name] Treating Byrl Latin/Extender: [name] in Treatment: 21 Wound Status Wound Number: 4 Primary Etiology: Trauma,  Other Wound Location: Right Lower Leg - Anterior, Wound Status: Open Proximal Comorbid History: Asthma, Hypertension Wounding Event: Trauma Date Acquired: 04/21/2016 Weeks Of Treatment: 19 Clustered Wound: No Wound Measurements Length: (cm) 0.5 Width: (cm) 0.1 Depth: (cm) 0.4 Area: (cm) 0.039 Volume: (cm) 0.016 % Reduction in Area: 99.9% % Reduction in Volume: 100% Epithelialization: None Tunneling: Yes Location 1 Position (o'clock): 12 Maximum Distance: (cm) 1.5 Location 2 Position (o'clock): 6 Maximum Distance: (cm) 0.9 Wound Description Full Thickness Without Exposed Foul Odor Aft Classification: Support Structures Slough/Fibrin Wound Margin: Flat and Intact Exudate Large Amount: Exudate Type: Serous Exudate Color: amber er Cleansing: No o No Wound Bed Granulation Amount: Large (67-100%) Exposed Structure Granulation Quality: Red Fascia Exposed: No Necrotic Amount: Small (1-33%) Fat Layer (Subcutaneous Tissue) Exposed: No Necrotic Quality: Adherent Slough Tendon Exposed: No Muscle Exposed: No Joint Exposed: No Bone Exposed: No [name], [name] ([id number]) Limited to Skin Breakdown Periwound Skin Texture Texture Color No Abnormalities Noted: No No Abnormalities Noted: No Callus: No Atrophie Blanche: No Crepitus: No Cyanosis: No Excoriation: No Ecchymosis: No Induration: No Erythema: No Rash: No Hemosiderin Staining: No Scarring: No Mottled: No Pallor: No Moisture Rubor: No No Abnormalities Noted: No Dry / Scaly: No Temperature / Pain Maceration: No Temperature: No Abnormality Tenderness on Palpation: Yes Wound Preparation Ulcer Cleansing: Rinsed/Irrigated with Saline Topical Anesthetic Applied: Other: lidocaine 4%, Treatment Notes Wound #4 (Right, Proximal, Anterior Lower Leg) 4. Dressing Applied: Iodoform packing Gauze 5. Secondary Dressing Applied Petroleum gauze Notes Kerlix to Publishing copy) Signed:  09/28/2016 5:54:52 PM By: Ashley Cool, [name], BSN, [name], BSN Entered By: Ashley Cool, [name], BSN, Ashley on 09/28/2016 14:50:18 [name] ([id number]) -------------------------------------------------------------------------------- Wound Assessment Details Patient Name: [name] Date of Service: 09/28/2016 2:30 PM Medical Record Number: [id number] Patient Account Number: [id number] Date of Birth/Sex: 01/27/1929 (83 y.o. Female)  Treating [name]: [name] Primary Care Glorie Dowlen: [name] Other Clinician: Referring Abdulah Iqbal: [name] Treating Louna Rothgeb/Extender: [name] in Treatment: 21 Wound Status Wound Number: 5 Primary Etiology: Trauma, Other Wound Location: Right, Distal Lower Leg Wound Status: Healed - Epithelialized Wounding Event: Trauma Date Acquired: 04/21/2016 Weeks Of Treatment: 10 Clustered Wound: No Wound Measurements Length: (cm) 0 % Reducti Width: (cm) 0 % Reducti Depth: (cm) 0 Area: (cm) 0 Volume: (cm) 0 on in Area: 100% on in Volume: 100% Wound Description Full Thickness Without Exposed Classification: Support Structures Periwound Skin Texture Texture Color No Abnormalities Noted: No No Abnormalities Noted: No Moisture No Abnormalities Noted: No Electronic Signature(s) Signed: 09/28/2016 5:54:52 PM By: Ashley Cool, [name], BSN, [name], BSN Entered By: Ashley Cool, [name], BSN, Ashley on 09/28/2016 15:10:43 [name] ([id number]) -------------------------------------------------------------------------------- [place] Details Patient Name: [name] Date of Service: 09/28/2016 2:30 PM Medical Record Number: [id number] Patient Account Number: [id number] Date of Birth/Sex: 23-Jul-1928 (83 y.o. Female) Treating [name]: [name] Primary Care Tavin Vernet: [name] Other Clinician: Referring Sanayah Munro: [name] Treating Morgan Keinath/Extender: [name] in Treatment: 21 Vital Signs Time Taken: 14:45 Temperature (F): 97.6 Height (in): 60 Pulse  (bpm): 75 Weight (lbs): 122 Respiratory Rate (breaths/min): 16 Body Mass Index (BMI): 23.8 Blood Pressure (mmHg): 171/80 Reference Range: 80 - 120 mg / dl Electronic Signature(s) Signed: 09/28/2016 5:54:52 PM By: Ashley Cool, [name], BSN, [name], BSN Entered By: Ashley Cool, [name], BSN, Ashley on 09/28/2016 14:45:36

## 2016-10-06 ENCOUNTER — Encounter: Payer: Medicare Other | Admitting: Surgery

## 2016-10-06 DIAGNOSIS — C44722 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, including hip: Secondary | ICD-10-CM | POA: Diagnosis not present

## 2016-10-08 NOTE — Progress Notes (Signed)
[name], [name] ([id number]) Visit Report for 10/06/2016 Arrival Information Details Patient Name: [name], [name] Date of Service: 10/06/2016 2:30 PM Medical Record Number: [id number] Patient Account Number: [id number] Date of Birth/Sex: [birth year] (83 y.o. Female) Treating RN: Ahmed Prima Primary Care Garnetta Fedrick: Clayborn Bigness Other Clinician: Referring Markayla Reichart: Clayborn Bigness Treating Fronia Depass/Extender: Frann Rider in Treatment: 22 Visit Information History Since Last Visit All ordered tests and consults were completed: No Patient Arrived: Ambulatory Added or deleted any medications: No Arrival Time: 14:36 Any new allergies or adverse reactions: No Accompanied By: daughter Had a fall or experienced change in No Transfer Assistance: None activities of daily living that may affect Patient Identification Verified: Yes risk of falls: Secondary Verification Process Yes Signs or symptoms of abuse/neglect since last No Completed: visito Patient Requires Transmission-Based No Hospitalized since last visit: No Precautions: Has Dressing in Place as Prescribed: Yes Patient Has Alerts: No Pain Present Now: No Electronic Signature(s) Signed: 10/06/2016 4:07:26 PM By: Alric Quan Entered By: Alric Quan on 10/06/2016 [name] ([id number]) -------------------------------------------------------------------------------- Clinic Level of Care Assessment Details Patient Name: [name] Date of Service: 10/06/2016 2:30 PM Medical Record Number: [id number] Patient Account Number: [id number] Date of Birth/Sex: 05/02/1929 (83 y.o. Female) Treating RN: Carolyne Fiscal, Debi Primary Care Caliann Leckrone: Clayborn Bigness Other Clinician: Referring Vernie Vinciguerra: Clayborn Bigness Treating Margie Brink/Extender: Frann Rider in Treatment: 22 Clinic Level of Care Assessment Items TOOL 4 Quantity Score []  - Use when only an EandM is performed on FOLLOW-UP visit  0 ASSESSMENTS - Nursing Assessment / Reassessment []  - Reassessment of Co-morbidities (includes updates in patient status) 0 X - Reassessment of Adherence to Treatment Plan 1 5 ASSESSMENTS - Wound and Skin Assessment / Reassessment []  - Simple Wound Assessment / Reassessment - one wound 0 X - Complex Wound Assessment / Reassessment - multiple wounds 1 5 []  - Dermatologic / Skin Assessment (not related to wound area) 0 ASSESSMENTS - Focused Assessment []  - Circumferential Edema Measurements - multi extremities 0 []  - Nutritional Assessment / Counseling / Intervention 0 []  - Lower Extremity Assessment (monofilament, tuning fork, pulses) 0 []  - Peripheral Arterial Disease Assessment (using hand held doppler) 0 ASSESSMENTS - Ostomy and/or Continence Assessment and Care []  - Incontinence Assessment and Management 0 []  - Ostomy Care Assessment and Management (repouching, etc.) 0 PROCESS - Coordination of Care X - Simple Patient / Family Education for ongoing care 1 15 []  - Complex (extensive) Patient / Family Education for ongoing care 0 []  - Staff obtains Programmer, systems, Records, Test Results / Process Orders 0 []  - Staff telephones HHA, Nursing Homes / Clarify orders / etc 0 []  - Routine Transfer to another Facility (non-emergent condition) 0 [name] ([id number]) []  - Routine Hospital Admission (non-emergent condition) 0 []  - New Admissions / Biomedical engineer / Ordering NPWT, Apligraf, etc. 0 []  - Emergency Hospital Admission (emergent condition) 0 X - Simple Discharge Coordination 1 10 []  - Complex (extensive) Discharge Coordination 0 PROCESS - Special Needs []  - Pediatric / Minor Patient Management 0 []  - Isolation Patient Management 0 []  - Hearing / Language / Visual special needs 0 []  - Assessment of Community assistance (transportation, D/C planning, etc.) 0 []  - Additional assistance / Altered mentation 0 []  - Support Surface(s) Assessment (bed, cushion, seat, etc.)  0 INTERVENTIONS - Wound Cleansing / Measurement X - Simple Wound Cleansing - one wound 1 5 []  - Complex Wound Cleansing - multiple wounds 0 X - Wound Imaging (photographs - any number of wounds) 1 5 []  -  Wound Tracing (instead of photographs) 0 X - Simple Wound Measurement - one wound 1 5 []  - Complex Wound Measurement - multiple wounds 0 INTERVENTIONS - Wound Dressings []  - Small Wound Dressing one or multiple wounds 0 X - Medium Wound Dressing one or multiple wounds 1 15 []  - Large Wound Dressing one or multiple wounds 0 []  - Application of Medications - topical 0 []  - Application of Medications - injection 0 INTERVENTIONS - Miscellaneous []  - External ear exam 0 [name] ([id number]) []  - Specimen Collection (cultures, biopsies, blood, body fluids, etc.) 0 []  - Specimen(s) / Culture(s) sent or taken to Lab for analysis 0 []  - Patient Transfer (multiple staff / Harrel Lemon Lift / Similar devices) 0 []  - Simple Staple / Suture removal (25 or less) 0 []  - Complex Staple / Suture removal (26 or more) 0 []  - Hypo / Hyperglycemic Management (close monitor of Blood Glucose) 0 []  - Ankle / Brachial Index (ABI) - do not check if billed separately 0 X - Vital Signs 1 5 Has the patient been seen at the hospital within the last three years: Yes Total Score: 70 Level Of Care: New/Established - Level 2 Electronic Signature(s) Signed: 10/06/2016 4:07:26 PM By: Alric Quan Entered By: Alric Quan on 10/06/2016 14:57:33 [name] ([id number]) -------------------------------------------------------------------------------- Encounter Discharge Information Details Patient Name: [name] Date of Service: 10/06/2016 2:30 PM Medical Record Number: [id number] Patient Account Number: [id number] Date of Birth/Sex: 17-Aug-1928 (83 y.o. Female) Treating RN: Carolyne Fiscal, Debi Primary Care Vaughan Garfinkle: Clayborn Bigness Other Clinician: Referring Brain Honeycutt: Clayborn Bigness Treating  Audwin Semper/Extender: Frann Rider in Treatment: 56 Encounter Discharge Information Items Discharge Pain Level: 0 Discharge Condition: Stable Ambulatory Status: Ambulatory Discharge Destination: Home Transportation: Ambulance Accompanied By: daughter Schedule Follow-up Appointment: Yes Medication Reconciliation completed and provided to Patient/Care Yes Toiya Morrish: Provided on Clinical Summary of Care: 10/06/2016 Form Type Recipient Paper Patient LP Electronic Signature(s) Signed: 10/06/2016 3:02:02 PM By: Ruthine Dose Entered By: Ruthine Dose on 10/06/2016 15:02:02 [name] ([id number]) -------------------------------------------------------------------------------- Lower Extremity Assessment Details Patient Name: [name] Date of Service: 10/06/2016 2:30 PM Medical Record Number: [id number] Patient Account Number: [id number] Date of Birth/Sex: 1928/06/24 (83 y.o. Female) Treating RN: Carolyne Fiscal, Debi Primary Care Ozil Stettler: Clayborn Bigness Other Clinician: Referring Saatvik Thielman: Clayborn Bigness Treating Leyna Vanderkolk/Extender: Frann Rider in Treatment: 22 Vascular Assessment Pulses: Dorsalis Pedis Palpable: [Right:Yes] Posterior Tibial Extremity colors, hair growth, and conditions: Extremity Color: [Right:Hyperpigmented] Temperature of Extremity: [Right:Warm] Capillary Refill: [Right:> 3 seconds] Toe Nail Assessment Left: Right: Thick: No Discolored: No Deformed: No Improper Length and Hygiene: No Electronic Signature(s) Signed: 10/06/2016 4:07:26 PM By: Alric Quan Entered By: Alric Quan on 10/06/2016 14:45:29 Donna, Edwena [name] ([id number]) -------------------------------------------------------------------------------- Multi Wound Chart Details Patient Name: [name] Date of Service: 10/06/2016 2:30 PM Medical Record Number: [id number] Patient Account Number: [id number] Date of Birth/Sex: 01/04/1929 (83 y.o. Female) Treating RN:  Ahmed Prima Primary Care Korrie Hofbauer: Clayborn Bigness Other Clinician: Referring Sebastiano Luecke: Clayborn Bigness Treating Zaven Klemens/Extender: Frann Rider in Treatment: 22 Vital Signs Height(in): 60 Pulse(bpm): 87 Weight(lbs): 122 Blood Pressure 158/59 (mmHg): Body Mass Index(BMI): 24 Temperature(F): 98.2 Respiratory Rate 18 (breaths/min): Photos: [4:No Photos] [N/A:N/A] Wound Location: [4:Right Lower Leg - Anterior, Proximal] [N/A:N/A] Wounding Event: [4:Trauma] [N/A:N/A] Primary Etiology: [4:Trauma, Other] [N/A:N/A] Comorbid History: [4:Asthma, Hypertension] [N/A:N/A] Date Acquired: [4:04/21/2016] [N/A:N/A] Weeks of Treatment: [4:20] [N/A:N/A] Wound Status: [4:Open] [N/A:N/A] Measurements L x W x D 1x0.1x0.4 [N/A:N/A] (cm) Area (cm) : [4:0.079] [N/A:N/A] Volume (cm) : [4:0.031] [N/A:N/A] % Reduction in Area: [4:99.70%] [N/A:N/A] %  Reduction in Volume: 99.90% [N/A:N/A] Position 1 (o'clock): 12 Maximum Distance 1 1.1 (cm): Position 2 (o'clock): 6 Maximum Distance 2 1.6 (cm): Tunneling: [4:Yes] [N/A:N/A] Classification: [4:Full Thickness Without Exposed Support Structures] [N/A:N/A] Exudate Amount: [4:Large] [N/A:N/A] Exudate Type: [4:Serous] [N/A:N/A] Exudate Color: [4:amber] [N/A:N/A] Wound Margin: [4:Flat and Intact] [N/A:N/A] Granulation Amount: [4:Large (67-100%)] [N/A:N/A] Granulation Quality: Red N/A N/A Necrotic Amount: Small (1-33%) N/A N/A Exposed Structures: Fascia: No N/A N/A Fat Layer (Subcutaneous Tissue) Exposed: No Tendon: No Muscle: No Joint: No Bone: No Limited to Skin Breakdown Epithelialization: None N/A N/A Periwound Skin Texture: Excoriation: No N/A N/A Induration: No Callus: No Crepitus: No Rash: No Scarring: No Periwound Skin Maceration: No N/A N/A Moisture: Dry/Scaly: No Periwound Skin Color: Atrophie Blanche: No N/A N/A Cyanosis: No Ecchymosis: No Erythema: No Hemosiderin Staining: No Mottled: No Pallor: No Rubor:  No Temperature: No Abnormality N/A N/A Tenderness on Yes N/A N/A Palpation: Wound Preparation: Ulcer Cleansing: N/A N/A Rinsed/Irrigated with Saline Topical Anesthetic Applied: Other: lidocaine 4% Treatment Notes Electronic Signature(s) Signed: 10/06/2016 2:55:55 PM By: Christin Fudge MD, FACS Entered By: Christin Fudge on 10/06/2016 14:55:55 COLINDA, BARTH ([id number]) -------------------------------------------------------------------------------- [place] Details Patient Name: [name] Date of Service: 10/06/2016 2:30 PM Medical Record Number: [id number] Patient Account Number: [id number] Date of Birth/Sex: 1929-05-23 (83 y.o. Female) Treating RN: Carolyne Fiscal, Debi Primary Care Jefferson Fullam: Clayborn Bigness Other Clinician: Referring Wandra Babin: Clayborn Bigness Treating Ahniyah Giancola/Extender: Frann Rider in Treatment: 22 Active Inactive ` Abuse / Safety / Falls / Self Care Management Nursing Diagnoses: Impaired physical mobility Potential for falls Goals: Patient will remain injury free Date Initiated: 05/04/2016 Target Resolution Date: 07/25/2016 Goal Status: Active Interventions: Assess fall risk on admission and as needed Notes: ` Orientation to the Wound Care Program Nursing Diagnoses: Knowledge deficit related to the wound healing center program Goals: Patient/caregiver will verbalize understanding of the [place] Program Date Initiated: 05/04/2016 Target Resolution Date: 07/25/2016 Goal Status: Active Interventions: Provide education on orientation to the wound center Notes: ` Pain, Acute or Chronic Nursing Diagnoses: Pain, acute or chronic: actual or potential MEITAL, RIEHL ([id number]) Goals: Patient/caregiver will verbalize adequate pain control between visits Date Initiated: 05/04/2016 Target Resolution Date: 07/25/2016 Goal Status: Active Interventions: Complete pain assessment as per visit  requirements Notes: ` Wound/Skin Impairment Nursing Diagnoses: Impaired tissue integrity Goals: Patient/caregiver will verbalize understanding of skin care regimen Date Initiated: 05/04/2016 Target Resolution Date: 07/25/2016 Goal Status: Active Ulcer/skin breakdown will have a volume reduction of 30% by week 4 Date Initiated: 05/04/2016 Target Resolution Date: 07/25/2016 Goal Status: Active Ulcer/skin breakdown will have a volume reduction of 50% by week 8 Date Initiated: 05/04/2016 Target Resolution Date: 08/22/2016 Goal Status: Active Ulcer/skin breakdown will have a volume reduction of 80% by week 12 Date Initiated: 05/04/2016 Target Resolution Date: 08/22/2016 Goal Status: Active Ulcer/skin breakdown will heal within 14 weeks Date Initiated: 05/04/2016 Target Resolution Date: 09/05/2016 Goal Status: Active Interventions: Assess patient/caregiver ability to obtain necessary supplies Assess patient/caregiver ability to perform ulcer/skin care regimen upon admission and as needed Assess ulceration(s) every visit Notes: Electronic Signature(s) Signed: 10/06/2016 4:07:26 PM By: Alric Quan Entered By: Alric Quan on 10/06/2016 14:44:59 Navratil, Edwena [name] ([id number]) -------------------------------------------------------------------------------- Pain Assessment Details Patient Name: [name] Date of Service: 10/06/2016 2:30 PM Medical Record Number: [id number] Patient Account Number: [id number] Date of Birth/Sex: 1929-03-01 (83 y.o. Female) Treating RN: Ahmed Prima Primary Care Cyndra Feinberg: Clayborn Bigness Other Clinician: Referring Josee Speece: Clayborn Bigness Treating Abdul Beirne/Extender: Frann Rider in Treatment: 22 Active Problems Location of  Pain Severity and Description of Pain Patient Has Paino No Site Locations With Dressing Change: No Pain Management and Medication Current Pain Management: Electronic Signature(s) Signed: 10/06/2016 4:07:26 PM By:  Alric Quan Entered By: Alric Quan on 10/06/2016 14:37:16 [name] ([id number]) -------------------------------------------------------------------------------- Patient/Caregiver Education Details Patient Name: [name] Date of Service: 10/06/2016 2:30 PM Medical Record Number: [id number] Patient Account Number: [id number] Date of Birth/Gender: 1929-01-13 (83 y.o. Female) Treating RN: Ahmed Prima Primary Care Physician: Clayborn Bigness Other Clinician: Referring Physician: Clayborn Bigness Treating Physician/Extender: Frann Rider in Treatment: 22 Education Assessment Education Provided To: Patient Education Topics Provided Wound/Skin Impairment: Handouts: Caring for Your Ulcer Methods: Demonstration Responses: State content correctly Electronic Signature(s) Signed: 10/06/2016 4:07:26 PM By: Alric Quan Entered By: Alric Quan on 10/06/2016 14:59:45 Rademaker, Edwena [name] ([id number]) -------------------------------------------------------------------------------- Wound Assessment Details Patient Name: [name] Date of Service: 10/06/2016 2:30 PM Medical Record Number: [id number] Patient Account Number: [id number] Date of Birth/Sex: 07-28-1928 (83 y.o. Female) Treating RN: Carolyne Fiscal, Debi Primary Care Ulla Mckiernan: Clayborn Bigness Other Clinician: Referring Cagney Degrace: Clayborn Bigness Treating Rim Thatch/Extender: Frann Rider in Treatment: 22 Wound Status Wound Number: 4 Primary Etiology: Trauma, Other Wound Location: Right Lower Leg - Anterior, Wound Status: Open Proximal Comorbid History: Asthma, Hypertension Wounding Event: Trauma Date Acquired: 04/21/2016 Weeks Of Treatment: 20 Clustered Wound: No Photos Photo Uploaded By: Gretta Cool, RN, BSN, Kim on 10/06/2016 16:31:22 Wound Measurements Length: (cm) 1 Width: (cm) 0.1 Depth: (cm) 0.4 Area: (cm) 0.079 Volume: (cm) 0.031 % Reduction in Area: 99.7% % Reduction in Volume:  99.9% Epithelialization: None Tunneling: Yes Location 1 Position (o'clock): 12 Maximum Distance: (cm) 1.1 Location 2 Position (o'clock): 6 Maximum Distance: (cm) 1.6 Undermining: No Wound Description Full Thickness Without Exposed Classification: Support Structures Wound Margin: Flat and Intact Exudate Large Amount: Exudate Type: Serous Biel, Bethzaida ([id number]) Foul Odor After Cleansing: No Slough/Fibrino No Exudate Color: amber Wound Bed Granulation Amount: Large (67-100%) Exposed Structure Granulation Quality: Red Fascia Exposed: No Necrotic Amount: Small (1-33%) Fat Layer (Subcutaneous Tissue) Exposed: No Necrotic Quality: Adherent Slough Tendon Exposed: No Muscle Exposed: No Joint Exposed: No Bone Exposed: No Limited to Skin Breakdown Periwound Skin Texture Texture Color No Abnormalities Noted: No No Abnormalities Noted: No Callus: No Atrophie Blanche: No Crepitus: No Cyanosis: No Excoriation: No Ecchymosis: No Induration: No Erythema: No Rash: No Hemosiderin Staining: No Scarring: No Mottled: No Pallor: No Moisture Rubor: No No Abnormalities Noted: No Dry / Scaly: No Temperature / Pain Maceration: No Temperature: No Abnormality Tenderness on Palpation: Yes Wound Preparation Ulcer Cleansing: Rinsed/Irrigated with Saline Topical Anesthetic Applied: Other: lidocaine 4%, Treatment Notes Wound #4 (Right, Proximal, Anterior Lower Leg) 1. Cleansed with: Clean wound with Normal Saline 2. Anesthetic Topical Lidocaine 4% cream to wound bed prior to debridement 4. Dressing Applied: Iodoform packing Gauze 5. Secondary [place] Notes Kerlix to secure Electronic Signature(s) Signed: 10/06/2016 4:07:26 PM By: Lance Sell, Edwena [name] ([id number]) Entered By: Alric Quan on 10/06/2016 14:43:32 AURIELLE, [place]  ([id number]) -------------------------------------------------------------------------------- [place] Details Patient Name: [name] Date of Service: 10/06/2016 2:30 PM Medical Record Number: [id number] Patient Account Number: [id number] Date of Birth/Sex: 26-May-1929 (83 y.o. Female) Treating RN: Carolyne Fiscal, Debi Primary Care Fiana Gladu: Clayborn Bigness Other Clinician: Referring Taite Baldassari: Clayborn Bigness Treating Rachel Samples/Extender: Frann Rider in Treatment: 22 Vital Signs Time Taken: 14:37 Temperature (F): 98.2 Height (in): 60 Pulse (bpm): 87 Weight (lbs): 122 Respiratory Rate (breaths/min): 18 Body Mass Index (BMI): 23.8 Blood Pressure (mmHg): 158/59 Reference Range: 80 - 120 mg / dl Pulse  Oximetry (%): 99 Electronic Signature(s) Signed: 10/06/2016 4:07:26 PM By: Alric Quan Entered By: Alric Quan on 10/06/2016 14:38:03
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
## 2016-10-12 ENCOUNTER — Encounter: Payer: Medicare Other | Admitting: Surgery

## 2016-10-12 DIAGNOSIS — C44722 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, including hip: Secondary | ICD-10-CM | POA: Diagnosis not present

## 2016-10-14 NOTE — Progress Notes (Signed)
MISSI, MCMACKIN ([id number]) Visit Report for 10/12/2016 Arrival Information Details Patient Name: [name], [name] Date of Service: 10/12/2016 2:30 PM Medical Record Number: [id number] Patient Account Number: [id number] Date of Birth/Sex: 19-Mar-1929 (83 y.o. Female) Treating RN: Montey Hora Primary Care Kenechukwu Eckstein: Clayborn Bigness Other Clinician: Referring Elzy Tomasello: Clayborn Bigness Treating Jireh Elmore/Extender: Frann Rider in Treatment: 23 Visit Information History Since Last Visit Added or deleted any medications: No Patient Arrived: Ambulatory Any new allergies or adverse reactions: No Arrival Time: 14:29 Had a fall or experienced change in No Accompanied By: self activities of daily living that may affect Transfer Assistance: None risk of falls: Patient Identification Verified: Yes Signs or symptoms of abuse/neglect since last No Secondary Verification Process Yes visito Completed: Hospitalized since last visit: No Patient Requires Transmission-Based No Has Dressing in Place as Prescribed: Yes Precautions: Pain Present Now: Yes Patient Has Alerts: No Electronic Signature(s) Signed: 10/12/2016 4:43:00 PM By: Montey Hora Entered By: Montey Hora on 10/12/2016 14:30:16 [name] ([id number]) -------------------------------------------------------------------------------- Clinic Level of Care Assessment Details Patient Name: [name] Date of Service: 10/12/2016 2:30 PM Medical Record Number: [id number] Patient Account Number: [id number] Date of Birth/Sex: 11/03/1928 (83 y.o. Female) Treating RN: Montey Hora Primary Care Eythan Jayne: Clayborn Bigness Other Clinician: Referring Jakim Drapeau: Clayborn Bigness Treating Skyelynn Rambeau/Extender: Frann Rider in Treatment: 23 Clinic Level of Care Assessment Items TOOL 4 Quantity Score []  - Use when only an EandM is performed on FOLLOW-UP visit 0 ASSESSMENTS - Nursing Assessment / Reassessment X - Reassessment of  Co-morbidities (includes updates in patient status) 1 10 X - Reassessment of Adherence to Treatment Plan 1 5 ASSESSMENTS - Wound and Skin Assessment / Reassessment X - Simple Wound Assessment / Reassessment - one wound 1 5 []  - Complex Wound Assessment / Reassessment - multiple wounds 0 []  - Dermatologic / Skin Assessment (not related to wound area) 0 ASSESSMENTS - Focused Assessment []  - Circumferential Edema Measurements - multi extremities 0 []  - Nutritional Assessment / Counseling / Intervention 0 X - Lower Extremity Assessment (monofilament, tuning fork, pulses) 1 5 []  - Peripheral Arterial Disease Assessment (using hand held doppler) 0 ASSESSMENTS - Ostomy and/or Continence Assessment and Care []  - Incontinence Assessment and Management 0 []  - Ostomy Care Assessment and Management (repouching, etc.) 0 PROCESS - Coordination of Care X - Simple Patient / Family Education for ongoing care 1 15 []  - Complex (extensive) Patient / Family Education for ongoing care 0 []  - Staff obtains Programmer, systems, Records, Test Results / Process Orders 0 []  - Staff telephones HHA, Nursing Homes / Clarify orders / etc 0 []  - Routine Transfer to another Facility (non-emergent condition) 0 Seifert, Chyenne ([id number]) []  - Routine Hospital Admission (non-emergent condition) 0 []  - New Admissions / Biomedical engineer / Ordering NPWT, Apligraf, etc. 0 []  - Emergency Hospital Admission (emergent condition) 0 X - Simple Discharge Coordination 1 10 []  - Complex (extensive) Discharge Coordination 0 PROCESS - Special Needs []  - Pediatric / Minor Patient Management 0 []  - Isolation Patient Management 0 []  - Hearing / Language / Visual special needs 0 []  - Assessment of Community assistance (transportation, D/C planning, etc.) 0 []  - Additional assistance / Altered mentation 0 []  - Support Surface(s) Assessment (bed, cushion, seat, etc.) 0 INTERVENTIONS - Wound Cleansing / Measurement X - Simple Wound  Cleansing - one wound 1 5 []  - Complex Wound Cleansing - multiple wounds 0 X - Wound Imaging (photographs - any number of wounds) 1 5 []  - Wound Tracing (instead of photographs)  0 X - Simple Wound Measurement - one wound 1 5 []  - Complex Wound Measurement - multiple wounds 0 INTERVENTIONS - Wound Dressings []  - Small Wound Dressing one or multiple wounds 0 X - Medium Wound Dressing one or multiple wounds 1 15 []  - Large Wound Dressing one or multiple wounds 0 []  - Application of Medications - topical 0 []  - Application of Medications - injection 0 INTERVENTIONS - Miscellaneous []  - External ear exam 0 Christner, Jessalynn ([id number]) []  - Specimen Collection (cultures, biopsies, blood, body fluids, etc.) 0 []  - Specimen(s) / Culture(s) sent or taken to Lab for analysis 0 []  - Patient Transfer (multiple staff / Harrel Lemon Lift / Similar devices) 0 []  - Simple Staple / Suture removal (25 or less) 0 []  - Complex Staple / Suture removal (26 or more) 0 []  - Hypo / Hyperglycemic Management (close monitor of Blood Glucose) 0 []  - Ankle / Brachial Index (ABI) - do not check if billed separately 0 X - Vital Signs 1 5 Has the patient been seen at the hospital within the last three years: Yes Total Score: 85 Level Of Care: New/Established - Level 3 Electronic Signature(s) Signed: 10/12/2016 4:43:00 PM By: Montey Hora Entered By: Montey Hora on 10/12/2016 16:37:22 [name] ([id number]) -------------------------------------------------------------------------------- Encounter Discharge Information Details Patient Name: [name] Date of Service: 10/12/2016 2:30 PM Medical Record Number: [id number] Patient Account Number: [id number] Date of Birth/Sex: Oct 13, 1928 (83 y.o. Female) Treating RN: Montey Hora Primary Care Tyja Gortney: Clayborn Bigness Other Clinician: Referring Angee Gupton: Clayborn Bigness Treating Fielding Mault/Extender: Frann Rider in Treatment: 83 Encounter Discharge  Information Items Discharge Pain Level: 0 Discharge Condition: Stable Ambulatory Status: Ambulatory Discharge Destination: Home Transportation: Private Auto Accompanied By: self Schedule Follow-up Appointment: Yes Medication Reconciliation completed and provided to Patient/Care No Estel Scholze: Provided on Clinical Summary of Care: 10/12/2016 Form Type Recipient Paper Patient LP Electronic Signature(s) Signed: 10/12/2016 3:32:36 PM By: Ruthine Dose Entered By: Ruthine Dose on 10/12/2016 15:32:36 Cary, [name] ([id number]) -------------------------------------------------------------------------------- Lower Extremity Assessment Details Patient Name: [name] Date of Service: 10/12/2016 2:30 PM Medical Record Number: [id number] Patient Account Number: [id number] Date of Birth/Sex: [birth year] (83 y.o. Female) Treating RN: Montey Hora Primary Care Deairra Halleck: Clayborn Bigness Other Clinician: Referring Lauretta Sallas: Clayborn Bigness Treating Demaris Leavell/Extender: Frann Rider in Treatment: 23 Vascular Assessment Pulses: Dorsalis Pedis Palpable: [Right:Yes] Posterior Tibial Extremity colors, hair growth, and conditions: Extremity Color: [Right:Mottled] Hair Growth on Extremity: [Right:No] Temperature of Extremity: [Right:Warm] Capillary Refill: [Right:< 3 seconds] Electronic Signature(s) Signed: 10/12/2016 4:43:00 PM By: Montey Hora Entered By: Montey Hora on 10/12/2016 14:45:33 Dumlao, Fayola ([id number]) -------------------------------------------------------------------------------- Multi Wound Chart Details Patient Name: [name] Date of Service: 10/12/2016 2:30 PM Medical Record Number: [id number] Patient Account Number: [id number] Date of Birth/Sex: [birth year] (83 y.o. Female) Treating RN: Montey Hora Primary Care Nox Talent: Clayborn Bigness Other Clinician: Referring Courtney Bellizzi: Clayborn Bigness Treating Dyani Babel/Extender: Frann Rider in  Treatment: 23 Vital Signs Height(in): 60 Pulse(bpm): 75 Weight(lbs): 122 Blood Pressure 190/70 (mmHg): Body Mass Index(BMI): 24 Temperature(F): 97.9 Respiratory Rate 18 (breaths/min): Photos: [4:No Photos] [N/A:N/A] Wound Location: [4:Right Lower Leg - Anterior, Proximal] [N/A:N/A] Wounding Event: [4:Trauma] [N/A:N/A] Primary Etiology: [4:Trauma, Other] [N/A:N/A] Comorbid History: [4:Asthma, Hypertension] [N/A:N/A] Date Acquired: [4:04/21/2016] [N/A:N/A] Weeks of Treatment: [4:21] [N/A:N/A] Wound Status: [4:Open] [N/A:N/A] Measurements L x W x D 0.9x0.1x0.4 [N/A:N/A] (cm) Area (cm) : [4:0.071] [N/A:N/A] Volume (cm) : [4:0.028] [N/A:N/A] % Reduction in Area: [4:99.80%] [N/A:N/A] % Reduction in Volume: 99.90% [N/A:N/A] Position 1 (o'clock): 12 Maximum Distance 1 1.2 (cm):  Tunneling: [4:Yes] [N/A:N/A] Classification: [4:Full Thickness Without Exposed Support Structures] [N/A:N/A] Exudate Amount: [4:Large] [N/A:N/A] Exudate Type: [4:Serous] [N/A:N/A] Exudate Color: [4:amber] [N/A:N/A] Wound Margin: [4:Flat and Intact] [N/A:N/A] Granulation Amount: [4:Large (67-100%)] [N/A:N/A] Granulation Quality: [4:Red] [N/A:N/A] Necrotic Amount: [4:Small (1-33%)] [N/A:N/A] Exposed Structures: Fascia: No N/A N/A Fat Layer (Subcutaneous Tissue) Exposed: No Tendon: No Muscle: No Joint: No Bone: No Limited to Skin Breakdown Epithelialization: None N/A N/A Periwound Skin Texture: Excoriation: No N/A N/A Induration: No Callus: No Crepitus: No Rash: No Scarring: No Periwound Skin Maceration: No N/A N/A Moisture: Dry/Scaly: No Periwound Skin Color: Atrophie Blanche: No N/A N/A Cyanosis: No Ecchymosis: No Erythema: No Hemosiderin Staining: No Mottled: No Pallor: No Rubor: No Temperature: No Abnormality N/A N/A Tenderness on Yes N/A N/A Palpation: Wound Preparation: Ulcer Cleansing: N/A N/A Rinsed/Irrigated with Saline Topical Anesthetic Applied: Other:  lidocaine 4% Treatment Notes Electronic Signature(s) Signed: 10/12/2016 3:34:10 PM By: Christin Fudge MD, FACS Entered By: Christin Fudge on 10/12/2016 15:34:10 LASHEIKA, ORTLOFF ([id number]) -------------------------------------------------------------------------------- [place] Details Patient Name: [name] Date of Service: 10/12/2016 2:30 PM Medical Record Number: [id number] Patient Account Number: [id number] Date of Birth/Sex: 1929-02-01 (83 y.o. Female) Treating RN: Montey Hora Primary Care Charise Leinbach: Clayborn Bigness Other Clinician: Referring Sarah Zerby: Clayborn Bigness Treating Josefa Syracuse/Extender: Frann Rider in Treatment: 48 Active Inactive ` Abuse / Safety / Falls / Self Care Management Nursing Diagnoses: Impaired physical mobility Potential for falls Goals: Patient will remain injury free Date Initiated: 05/04/2016 Target Resolution Date: 07/25/2016 Goal Status: Active Interventions: Assess fall risk on admission and as needed Notes: ` Orientation to the Wound Care Program Nursing Diagnoses: Knowledge deficit related to the wound healing center program Goals: Patient/caregiver will verbalize understanding of the [place] Program Date Initiated: 05/04/2016 Target Resolution Date: 07/25/2016 Goal Status: Active Interventions: Provide education on orientation to the wound center Notes: ` Pain, Acute or Chronic Nursing Diagnoses: Pain, acute or chronic: actual or potential PARISH, AUGUSTINE ([id number]) Goals: Patient/caregiver will verbalize adequate pain control between visits Date Initiated: 05/04/2016 Target Resolution Date: 07/25/2016 Goal Status: Active Interventions: Complete pain assessment as per visit requirements Notes: ` Wound/Skin Impairment Nursing Diagnoses: Impaired tissue integrity Goals: Patient/caregiver will verbalize understanding of skin care regimen Date Initiated: 05/04/2016 Target Resolution  Date: 07/25/2016 Goal Status: Active Ulcer/skin breakdown will have a volume reduction of 30% by week 4 Date Initiated: 05/04/2016 Target Resolution Date: 07/25/2016 Goal Status: Active Ulcer/skin breakdown will have a volume reduction of 50% by week 8 Date Initiated: 05/04/2016 Target Resolution Date: 08/22/2016 Goal Status: Active Ulcer/skin breakdown will have a volume reduction of 80% by week 12 Date Initiated: 05/04/2016 Target Resolution Date: 08/22/2016 Goal Status: Active Ulcer/skin breakdown will heal within 14 weeks Date Initiated: 05/04/2016 Target Resolution Date: 09/05/2016 Goal Status: Active Interventions: Assess patient/caregiver ability to obtain necessary supplies Assess patient/caregiver ability to perform ulcer/skin care regimen upon admission and as needed Assess ulceration(s) every visit Notes: Electronic Signature(s) Signed: 10/12/2016 4:43:00 PM By: Montey Hora Entered By: Montey Hora on 10/12/2016 14:45:42 Pyper, [name] ([id number]) -------------------------------------------------------------------------------- Pain Assessment Details Patient Name: [name] Date of Service: 10/12/2016 2:30 PM Medical Record Number: [id number] Patient Account Number: [id number] Date of Birth/Sex: 22-Jan-1929 (83 y.o. Female) Treating RN: Montey Hora Primary Care Brizeida Mcmurry: Clayborn Bigness Other Clinician: Referring Azaryah Heathcock: Clayborn Bigness Treating Anureet Bruington/Extender: Frann Rider in Treatment: 23 Active Problems Location of Pain Severity and Description of Pain Patient Has Paino Yes Site Locations Pain Location: Pain in Ulcers With Dressing Change: Yes Duration of the Pain.  Constant / Intermittento Constant Pain Management and Medication Current Pain Management: Electronic Signature(s) Signed: 10/12/2016 4:43:00 PM By: Montey Hora Entered By: Montey Hora on 10/12/2016 14:31:23 [name]  ([id number]) -------------------------------------------------------------------------------- Patient/Caregiver Education Details Patient Name: [name] Date of Service: 10/12/2016 2:30 PM Medical Record Number: [id number] Patient Account Number: [id number] Date of Birth/Gender: 24-Aug-1928 (83 y.o. Female) Treating RN: Montey Hora Primary Care Physician: Clayborn Bigness Other Clinician: Referring Physician: Clayborn Bigness Treating Physician/Extender: Frann Rider in Treatment: 71 Education Assessment Education Provided To: Patient Education Topics Provided Wound/Skin Impairment: Handouts: Other: wound care s ordered Methods: Demonstration, Explain/Verbal Responses: State content correctly Electronic Signature(s) Signed: 10/12/2016 4:43:00 PM By: Montey Hora Entered By: Montey Hora on 10/12/2016 14:47:43 Caraveo, Melvena ([id number]) -------------------------------------------------------------------------------- Wound Assessment Details Patient Name: [name] Date of Service: 10/12/2016 2:30 PM Medical Record Number: [id number] Patient Account Number: [id number] Date of Birth/Sex: 03/15/1929 (83 y.o. Female) Treating RN: Montey Hora Primary Care Baron Parmelee: Clayborn Bigness Other Clinician: Referring Marquese Burkland: Clayborn Bigness Treating Jean Skow/Extender: Frann Rider in Treatment: 23 Wound Status Wound Number: 4 Primary Etiology: Trauma, Other Wound Location: Right Lower Leg - Anterior, Wound Status: Open Proximal Comorbid History: Asthma, Hypertension Wounding Event: Trauma Date Acquired: 04/21/2016 Weeks Of Treatment: 21 Clustered Wound: No Photos Photo Uploaded By: Montey Hora on 10/12/2016 16:33:16 Wound Measurements Length: (cm) 0.9 Width: (cm) 0.1 Depth: (cm) 0.4 Area: (cm) 0.071 Volume: (cm) 0.028 % Reduction in Area: 99.8% % Reduction in Volume: 99.9% Epithelialization: None Tunneling: Yes Position (o'clock): 12 Maximum  Distance: (cm) 1.2 Undermining: No Wound Description Full Thickness Without Exposed Classification: Support Structures Wound Margin: Flat and Intact Exudate Large Amount: Exudate Type: Serous Exudate Color: amber Corum, Britiny ([id number]) Foul Odor After Cleansing: No Slough/Fibrino No Wound Bed Granulation Amount: Large (67-100%) Exposed Structure Granulation Quality: Red Fascia Exposed: No Necrotic Amount: Small (1-33%) Fat Layer (Subcutaneous Tissue) Exposed: No Necrotic Quality: Adherent Slough Tendon Exposed: No Muscle Exposed: No Joint Exposed: No Bone Exposed: No Limited to Skin Breakdown Periwound Skin Texture Texture Color No Abnormalities Noted: No No Abnormalities Noted: No Callus: No Atrophie Blanche: No Crepitus: No Cyanosis: No Excoriation: No Ecchymosis: No Induration: No Erythema: No Rash: No Hemosiderin Staining: No Scarring: No Mottled: No Pallor: No Moisture Rubor: No No Abnormalities Noted: No Dry / Scaly: No Temperature / Pain Maceration: No Temperature: No Abnormality Tenderness on Palpation: Yes Wound Preparation Ulcer Cleansing: Rinsed/Irrigated with Saline Topical Anesthetic Applied: Other: lidocaine 4%, Treatment Notes Wound #4 (Right, Proximal, Anterior Lower Leg) 1. Cleansed with: Clean wound with Normal Saline 2. Anesthetic Topical Lidocaine 4% cream to wound bed prior to debridement 4. Dressing Applied: Iodoform packing Gauze 5. Secondary Dressing Applied Kerlix/Conform Non-Adherent pad 7. Secured with Tape Notes Kerlix to [employer]) BRISTYL, MCLEES ([id number]) Signed: 10/12/2016 4:43:00 PM By: Montey Hora Entered By: Montey Hora on 10/12/2016 14:44:13 [place]on, [name] ([id number]) -------------------------------------------------------------------------------- Vitals Details Patient Name: [name] Date of Service: 10/12/2016 2:30 PM Medical Record Number:  [id number] Patient Account Number: [id number] Date of Birth/Sex: 06-21-1928 (83 y.o. Female) Treating RN: Montey Hora Primary Care Adeana Grilliot: Clayborn Bigness Other Clinician: Referring Yumalay Circle: Clayborn Bigness Treating Jewell Haught/Extender: Frann Rider in Treatment: 23 Vital Signs Time Taken: 14:31 Temperature (F): 97.9 Height (in): 60 Pulse (bpm): 75 Weight (lbs): 122 Respiratory Rate (breaths/min): 18 Body Mass Index (BMI): 23.8 Blood Pressure (mmHg): 190/70 Reference Range: 80 - 120 mg / dl Electronic Signature(s) Signed: 10/12/2016 4:43:00 PM By: Montey Hora Entered By: Montey Hora on 10/12/2016 14:32:28
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
## 2016-10-20 ENCOUNTER — Encounter: Payer: Medicare Other | Admitting: Surgery

## 2016-10-20 DIAGNOSIS — C44722 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, including hip: Secondary | ICD-10-CM | POA: Diagnosis not present

## 2016-10-22 NOTE — Progress Notes (Signed)
[name], [name] ([id number]) Visit Report for 10/20/2016 Arrival Information Details Patient Name: [name] Date of Service: 10/20/2016 12:30 PM Medical Record Number: [id number] Patient Account Number: [id number] Date of Birth/Sex: May 23, 1929 (83 y.o. Female) Treating RN: Montey Hora Primary Care Saagar Tortorella: Clayborn Bigness Other Clinician: Referring Anes Rigel: Clayborn Bigness Treating Cage Gupton/Extender: Frann Rider in Treatment: 24 Visit Information History Since Last Visit Added or deleted any medications: No Patient Arrived: Ambulatory Any new allergies or adverse reactions: No Arrival Time: 12:36 Had a fall or experienced change in No Accompanied By: dtr activities of daily living that may affect Transfer Assistance: None risk of falls: Patient Identification Verified: Yes Signs or symptoms of abuse/neglect since last No Secondary Verification Process Yes visito Completed: Hospitalized since last visit: No Patient Requires Transmission-Based No Has Dressing in Place as Prescribed: Yes Precautions: Pain Present Now: No Patient Has Alerts: No Electronic Signature(s) Signed: 10/20/2016 3:54:46 PM By: Montey Hora Entered By: Montey Hora on 10/20/2016 12:37:09 [name] ([id number]) -------------------------------------------------------------------------------- Clinic Level of Care Assessment Details Patient Name: [name] Date of Service: 10/20/2016 12:30 PM Medical Record Number: [id number] Patient Account Number: [id number] Date of Birth/Sex: [birth year] (82 y.o. Female) Treating RN: Montey Hora Primary Care Mihcael Ledee: Clayborn Bigness Other Clinician: Referring Krzysztof Reichelt: Clayborn Bigness Treating Tayten Heber/Extender: Frann Rider in Treatment: 24 Clinic Level of Care Assessment Items TOOL 4 Quantity Score []  - Use when only an EandM is performed on FOLLOW-UP visit 0 ASSESSMENTS - Nursing Assessment / Reassessment X - Reassessment of  Co-morbidities (includes updates in patient status) 1 10 X - Reassessment of Adherence to Treatment Plan 1 5 ASSESSMENTS - Wound and Skin Assessment / Reassessment X - Simple Wound Assessment / Reassessment - one wound 1 5 []  - Complex Wound Assessment / Reassessment - multiple wounds 0 []  - Dermatologic / Skin Assessment (not related to wound area) 0 ASSESSMENTS - Focused Assessment []  - Circumferential Edema Measurements - multi extremities 0 []  - Nutritional Assessment / Counseling / Intervention 0 X - Lower Extremity Assessment (monofilament, tuning fork, pulses) 1 5 []  - Peripheral Arterial Disease Assessment (using hand held doppler) 0 ASSESSMENTS - Ostomy and/or Continence Assessment and Care []  - Incontinence Assessment and Management 0 []  - Ostomy Care Assessment and Management (repouching, etc.) 0 PROCESS - Coordination of Care X - Simple Patient / Family Education for ongoing care 1 15 []  - Complex (extensive) Patient / Family Education for ongoing care 0 []  - Staff obtains Programmer, systems, Records, Test Results / Process Orders 0 []  - Staff telephones HHA, Nursing Homes / Clarify orders / etc 0 []  - Routine Transfer to another Facility (non-emergent condition) 0 [name], [name] ([id number]) []  - Routine Hospital Admission (non-emergent condition) 0 []  - New Admissions / Biomedical engineer / Ordering NPWT, Apligraf, etc. 0 []  - Emergency Hospital Admission (emergent condition) 0 X - Simple Discharge Coordination 1 10 []  - Complex (extensive) Discharge Coordination 0 PROCESS - Special Needs []  - Pediatric / Minor Patient Management 0 []  - Isolation Patient Management 0 []  - Hearing / Language / Visual special needs 0 []  - Assessment of Community assistance (transportation, D/C planning, etc.) 0 []  - Additional assistance / Altered mentation 0 []  - Support Surface(s) Assessment (bed, cushion, seat, etc.) 0 INTERVENTIONS - Wound Cleansing / Measurement X - Simple Wound  Cleansing - one wound 1 5 []  - Complex Wound Cleansing - multiple wounds 0 X - Wound Imaging (photographs - any number of wounds) 1 5 []  - Wound Tracing (instead of photographs)  0 X - Simple Wound Measurement - one wound 1 5 []  - Complex Wound Measurement - multiple wounds 0 INTERVENTIONS - Wound Dressings X - Small Wound Dressing one or multiple wounds 1 10 []  - Medium Wound Dressing one or multiple wounds 0 []  - Large Wound Dressing one or multiple wounds 0 []  - Application of Medications - topical 0 []  - Application of Medications - injection 0 INTERVENTIONS - Miscellaneous []  - External ear exam 0 [name], [name] ([id number]) []  - Specimen Collection (cultures, biopsies, blood, body fluids, etc.) 0 []  - Specimen(s) / Culture(s) sent or taken to Lab for analysis 0 []  - Patient Transfer (multiple staff / Harrel Lemon Lift / Similar devices) 0 []  - Simple Staple / Suture removal (25 or less) 0 []  - Complex Staple / Suture removal (26 or more) 0 []  - Hypo / Hyperglycemic Management (close monitor of Blood Glucose) 0 []  - Ankle / Brachial Index (ABI) - do not check if billed separately 0 X - Vital Signs 1 5 Has the patient been seen at the hospital within the last three years: Yes Total Score: 80 Level Of Care: New/Established - Level 3 Electronic Signature(s) Signed: 10/20/2016 3:54:46 PM By: Montey Hora Entered By: Montey Hora on 10/20/2016 13:57:08 [name] ([id number]) -------------------------------------------------------------------------------- Encounter Discharge Information Details Patient Name: [name] Date of Service: 10/20/2016 12:30 PM Medical Record Number: [id number] Patient Account Number: [id number] Date of Birth/Sex: 08/17/1928 (83 y.o. Female) Treating RN: Montey Hora Primary Care Yale Golla: Clayborn Bigness Other Clinician: Referring Lunden Stieber: Clayborn Bigness Treating Ceasia Elwell/Extender: Frann Rider in Treatment: 68 Encounter Discharge  Information Items Discharge Pain Level: 0 Discharge Condition: Stable Ambulatory Status: Ambulatory Discharge Destination: Home Transportation: Private Auto Accompanied By: dtr Schedule Follow-up Appointment: Yes Medication Reconciliation completed and provided to Patient/Care No Courtny Bennison: Provided on Clinical Summary of Care: 10/20/2016 Form Type Recipient Paper Patient LP Electronic Signature(s) Signed: 10/20/2016 1:19:41 PM By: Ruthine Dose Entered By: Ruthine Dose on 10/20/2016 13:19:41 [name], [name] ([id number]) -------------------------------------------------------------------------------- Lower Extremity Assessment Details Patient Name: [name] Date of Service: 10/20/2016 12:30 PM Medical Record Number: [id number] Patient Account Number: [id number] Date of Birth/Sex: 06-12-1928 (83 y.o. Female) Treating RN: Montey Hora Primary Care Rodnesha Elie: Clayborn Bigness Other Clinician: Referring Donato Studley: Clayborn Bigness Treating Jenice Leiner/Extender: Frann Rider in Treatment: 24 Vascular Assessment Pulses: Dorsalis Pedis Palpable: [Right:Yes] Posterior Tibial Extremity colors, hair growth, and conditions: Extremity Color: [Right:Mottled] Hair Growth on Extremity: [Right:Yes] Temperature of Extremity: [Right:Warm] Capillary Refill: [Right:< 3 seconds] Electronic Signature(s) Signed: 10/20/2016 3:54:46 PM By: Montey Hora Entered By: Montey Hora on 10/20/2016 13:01:21 [name] ([id number]) -------------------------------------------------------------------------------- Multi Wound Chart Details Patient Name: [name] Date of Service: 10/20/2016 12:30 PM Medical Record Number: [id number] Patient Account Number: [id number] Date of Birth/Sex: June 06, 1928 (83 y.o. Female) Treating RN: Montey Hora Primary Care Ashrita Chrismer: Clayborn Bigness Other Clinician: Referring Victorine Mcnee: Clayborn Bigness Treating Tyquasia Pant/Extender: Frann Rider in  Treatment: 24 Vital Signs Height(in): 60 Pulse(bpm): 74 Weight(lbs): 122 Blood Pressure 203/66 (mmHg): Body Mass Index(BMI): 24 Temperature(F): 98.2 Respiratory Rate 18 (breaths/min): Photos: [4:No Photos] [N/A:N/A] Wound Location: [4:Right Lower Leg - Anterior, Proximal] [N/A:N/A] Wounding Event: [4:Trauma] [N/A:N/A] Primary Etiology: [4:Trauma, Other] [N/A:N/A] Comorbid History: [4:Asthma, Hypertension] [N/A:N/A] Date Acquired: [4:04/21/2016] [N/A:N/A] Weeks of Treatment: [4:22] [N/A:N/A] Wound Status: [4:Open] [N/A:N/A] Measurements L x W x D 0.9x0.1x0.4 [N/A:N/A] (cm) Area (cm) : [4:0.071] [N/A:N/A] Volume (cm) : [4:0.028] [N/A:N/A] % Reduction in Area: [4:99.80%] [N/A:N/A] % Reduction in Volume: 99.90% [N/A:N/A] Position 1 (o'clock): 12 Maximum Distance 1 1 (cm):  Tunneling: [4:Yes] [N/A:N/A] Classification: [4:Full Thickness Without Exposed Support Structures] [N/A:N/A] Exudate Amount: [4:Large] [N/A:N/A] Exudate Type: [4:Serous] [N/A:N/A] Exudate Color: [4:amber] [N/A:N/A] Wound Margin: [4:Flat and Intact] [N/A:N/A] Granulation Amount: [4:Large (67-100%)] [N/A:N/A] Granulation Quality: [4:Red] [N/A:N/A] Necrotic Amount: [4:Small (1-33%)] [N/A:N/A] Exposed Structures: Fascia: No N/A N/A Fat Layer (Subcutaneous Tissue) Exposed: No Tendon: No Muscle: No Joint: No Bone: No Limited to Skin Breakdown Epithelialization: None N/A N/A Periwound Skin Texture: Excoriation: No N/A N/A Induration: No Callus: No Crepitus: No Rash: No Scarring: No Periwound Skin Maceration: No N/A N/A Moisture: Dry/Scaly: No Periwound Skin Color: Atrophie Blanche: No N/A N/A Cyanosis: No Ecchymosis: No Erythema: No Hemosiderin Staining: No Mottled: No Pallor: No Rubor: No Temperature: No Abnormality N/A N/A Tenderness on Yes N/A N/A Palpation: Wound Preparation: Ulcer Cleansing: N/A N/A Rinsed/Irrigated with Saline Topical Anesthetic Applied: Other:  lidocaine 4% Treatment Notes Electronic Signature(s) Signed: 10/20/2016 1:13:29 PM By: Christin Fudge MD, FACS Entered By: Christin Fudge on 10/20/2016 13:13:29 [name], [name] ([id number]) -------------------------------------------------------------------------------- [place] Details Patient Name: [name] Date of Service: 10/20/2016 12:30 PM Medical Record Number: [id number] Patient Account Number: [id number] Date of Birth/Sex: [birth year] (83 y.o. Female) Treating RN: Montey Hora Primary Care Vincentina Sollers: Clayborn Bigness Other Clinician: Referring Mataeo Ingwersen: Clayborn Bigness Treating Kailynn Satterly/Extender: Frann Rider in Treatment: 88 Active Inactive ` Abuse / Safety / Falls / Self Care Management Nursing Diagnoses: Impaired physical mobility Potential for falls Goals: Patient will remain injury free Date Initiated: 05/04/2016 Target Resolution Date: 07/25/2016 Goal Status: Active Interventions: Assess fall risk on admission and as needed Notes: ` Orientation to the Wound Care Program Nursing Diagnoses: Knowledge deficit related to the wound healing center program Goals: Patient/caregiver will verbalize understanding of the [facility] Program Date Initiated: 05/04/2016 Target Resolution Date: 07/25/2016 Goal Status: Active Interventions: Provide education on orientation to the wound center Notes: ` Pain, Acute or Chronic Nursing Diagnoses: Pain, acute or chronic: actual or potential [name], [name] ([id number]) Goals: Patient/caregiver will verbalize adequate pain control between visits Date Initiated: 05/04/2016 Target Resolution Date: 07/25/2016 Goal Status: Active Interventions: Complete pain assessment as per visit requirements Notes: ` Wound/Skin Impairment Nursing Diagnoses: Impaired tissue integrity Goals: Patient/caregiver will verbalize understanding of skin care regimen Date Initiated: 05/04/2016 Target Resolution  Date: 07/25/2016 Goal Status: Active Ulcer/skin breakdown will have a volume reduction of 30% by week 4 Date Initiated: 05/04/2016 Target Resolution Date: 07/25/2016 Goal Status: Active Ulcer/skin breakdown will have a volume reduction of 50% by week 8 Date Initiated: 05/04/2016 Target Resolution Date: 08/22/2016 Goal Status: Active Ulcer/skin breakdown will have a volume reduction of 80% by week 12 Date Initiated: 05/04/2016 Target Resolution Date: 08/22/2016 Goal Status: Active Ulcer/skin breakdown will heal within 14 weeks Date Initiated: 05/04/2016 Target Resolution Date: 09/05/2016 Goal Status: Active Interventions: Assess patient/caregiver ability to obtain necessary supplies Assess patient/caregiver ability to perform ulcer/skin care regimen upon admission and as needed Assess ulceration(s) every visit Notes: Electronic Signature(s) Signed: 10/20/2016 3:54:46 PM By: Montey Hora Entered By: Montey Hora on 10/20/2016 13:01:35 [name] ([id number]) -------------------------------------------------------------------------------- Pain Assessment Details Patient Name: [name] Date of Service: 10/20/2016 12:30 PM Medical Record Number: [id number] Patient Account Number: [id number] Date of Birth/Sex: 02/02/1929 (83 y.o. Female) Treating RN: Montey Hora Primary Care Everlean Bucher: Clayborn Bigness Other Clinician: Referring Alexander Aument: Clayborn Bigness Treating Threasa Kinch/Extender: Frann Rider in Treatment: 24 Active Problems Location of Pain Severity and Description of Pain Patient Has Paino No Site Locations Pain Management and Medication Current Pain Management: Notes Topical or injectable lidocaine is  offered to patient for acute pain when surgical debridement is performed. If needed, Patient is instructed to use over the counter pain medication for the following 24-48 hours after debridement. Wound care MDs do not prescribed pain medications. Patient has  chronic pain or uncontrolled pain. Patient has been instructed to make an appointment with their Primary Care Physician for pain management. Electronic Signature(s) Signed: 10/20/2016 3:54:46 PM By: Montey Hora Entered By: Montey Hora on 10/20/2016 12:37:19 [name] ([id number]) -------------------------------------------------------------------------------- Patient/Caregiver Education Details Patient Name: [name] Date of Service: 10/20/2016 12:30 PM Medical Record Number: [id number] Patient Account Number: [id number] Date of Birth/Gender: 07/16/1928 (83 y.o. Female) Treating RN: Montey Hora Primary Care Physician: Clayborn Bigness Other Clinician: Referring Physician: Clayborn Bigness Treating Physician/Extender: Frann Rider in Treatment: 20 Education Assessment Education Provided To: Patient and Caregiver Education Topics Provided Wound/Skin Impairment: Handouts: Other: wound care as ordered Methods: Demonstration, Explain/Verbal Responses: State content correctly Electronic Signature(s) Signed: 10/20/2016 3:54:46 PM By: Montey Hora Entered By: Montey Hora on 10/20/2016 13:02:56 [name], [name] ([id number]) -------------------------------------------------------------------------------- Wound Assessment Details Patient Name: [name] Date of Service: 10/20/2016 12:30 PM Medical Record Number: [id number] Patient Account Number: [id number] Date of Birth/Sex: 05-29-1929 (83 y.o. Female) Treating RN: Montey Hora Primary Care Darvin Dials: Clayborn Bigness Other Clinician: Referring Chapman Matteucci: Clayborn Bigness Treating Rakesh Dutko/Extender: Frann Rider in Treatment: 24 Wound Status Wound Number: 4 Primary Etiology: Trauma, Other Wound Location: Right Lower Leg - Anterior, Wound Status: Open Proximal Comorbid History: Asthma, Hypertension Wounding Event: Trauma Date Acquired: 04/21/2016 Weeks Of Treatment: 22 Clustered Wound:  No Photos Photo Uploaded By: Montey Hora on 10/20/2016 14:20:01 Wound Measurements Length: (cm) 0.9 Width: (cm) 0.1 Depth: (cm) 0.4 Area: (cm) 0.071 Volume: (cm) 0.028 % Reduction in Area: 99.8% % Reduction in Volume: 99.9% Epithelialization: None Tunneling: Yes Position (o'clock): 12 Maximum Distance: (cm) 1 Undermining: No Wound Description Full Thickness Without Exposed Classification: Support Structures Wound Margin: Flat and Intact Exudate Large Amount: Exudate Type: Serous Exudate Color: amber [name], [name] ([id number]) Foul Odor After Cleansing: No Slough/Fibrino No Wound Bed Granulation Amount: Large (67-100%) Exposed Structure Granulation Quality: Red Fascia Exposed: No Necrotic Amount: Small (1-33%) Fat Layer (Subcutaneous Tissue) Exposed: No Necrotic Quality: Adherent Slough Tendon Exposed: No Muscle Exposed: No Joint Exposed: No Bone Exposed: No Limited to Skin Breakdown Periwound Skin Texture Texture Color No Abnormalities Noted: No No Abnormalities Noted: No Callus: No Atrophie Blanche: No Crepitus: No Cyanosis: No Excoriation: No Ecchymosis: No Induration: No Erythema: No Rash: No Hemosiderin Staining: No Scarring: No Mottled: No Pallor: No Moisture Rubor: No No Abnormalities Noted: No Dry / Scaly: No Temperature / Pain Maceration: No Temperature: No Abnormality Tenderness on Palpation: Yes Wound Preparation Ulcer Cleansing: Rinsed/Irrigated with Saline Topical Anesthetic Applied: Other: lidocaine 4%, Treatment Notes Wound #4 (Right, Proximal, Anterior Lower Leg) 1. Cleansed with: Clean wound with Normal Saline 2. Anesthetic Topical Lidocaine 4% cream to wound bed prior to debridement 4. Dressing Applied: Iodoform packing Gauze 5. Secondary Dressing Applied Kerlix/Conform Non-Adherent pad Notes Kerlix to secure Electronic Signature(s) Signed: 10/20/2016 3:54:46 PM By: Montey Hora Entered By: Montey Hora  on 10/20/2016 12:48:06 [name], [name] ([id number]) [name], [name] ([id number]) -------------------------------------------------------------------------------- Vitals Details Patient Name: [name] Date of Service: 10/20/2016 12:30 PM Medical Record Number: [id number] Patient Account Number: [id number] Date of Birth/Sex: July 07, 1928 (83 y.o. Female) Treating RN: Montey Hora Primary Care Gissel Keilman: Clayborn Bigness Other Clinician: Referring Luman Holway: Clayborn Bigness Treating Phoebe Marter/Extender: Frann Rider in Treatment: 24 Vital Signs Time Taken: 12:37 Temperature (F): 98.2 Height (  in): 60 Pulse (bpm): 74 Weight (lbs): 122 Respiratory Rate (breaths/min): 18 Body Mass Index (BMI): 23.8 Blood Pressure (mmHg): 203/66 Reference Range: 80 - 120 mg / dl Electronic Signature(s) Signed: 10/20/2016 3:54:46 PM By: Montey Hora Entered By: Montey Hora on 10/20/2016 12:38:51
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
## 2016-10-26 ENCOUNTER — Encounter: Payer: Medicare Other | Admitting: Surgery

## 2016-10-26 DIAGNOSIS — C44722 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, including hip: Secondary | ICD-10-CM | POA: Diagnosis not present

## 2016-10-27 NOTE — Progress Notes (Signed)
[name], [name] ([id number]) Visit Report for 10/26/2016 Arrival Information Details Patient Name: [name], [name] Date of Service: 10/26/2016 2:00 PM Medical Record Number: [id number] Patient Account Number: [id number] Date of Birth/Sex: 01-08-1929 (83 y.o. Female) Treating RN: Afful, RN, BSN, [employer] Primary Care Jelena Malicoat: Clayborn Bigness Other Clinician: Referring Trell Secrist: Clayborn Bigness Treating Nature Vogelsang/Extender: Frann Rider in Treatment: 25 Visit Information History Since Last Visit All ordered tests and consults were completed: No Patient Arrived: Ambulatory Added or deleted any medications: No Arrival Time: 14:08 Any new allergies or adverse reactions: No Accompanied By: DTR Had a fall or experienced change in No Transfer Assistance: None activities of daily living that may affect Patient Identification Verified: Yes risk of falls: Secondary Verification Process Yes Signs or symptoms of abuse/neglect since last No Completed: visito Patient Requires Transmission-Based No Hospitalized since last visit: No Precautions: Has Dressing in Place as Prescribed: Yes Patient Has Alerts: No Has Compression in Place as Prescribed: Yes Pain Present Now: No Electronic Signature(s) Signed: 10/26/2016 3:19:34 PM By: Regan Lemming BSN, RN Entered By: Regan Lemming on 10/26/2016 14:08:24 [name] ([id number]) -------------------------------------------------------------------------------- Clinic Level of Care Assessment Details Patient Name: [name] Date of Service: 10/26/2016 2:00 PM Medical Record Number: [id number] Patient Account Number: [id number] Date of Birth/Sex: [birth year] (83 y.o. Female) Treating RN: Afful, RN, BSN, [facility] Primary Care Armina Galloway: Clayborn Bigness Other Clinician: Referring Terrence Pizana: Clayborn Bigness Treating Shearon Clonch/Extender: Frann Rider in Treatment: 25 Clinic Level of Care Assessment Items TOOL 4 Quantity Score []  - Use when only an  EandM is performed on FOLLOW-UP visit 0 ASSESSMENTS - Nursing Assessment / Reassessment X - Reassessment of Co-morbidities (includes updates in patient status) 1 10 X - Reassessment of Adherence to Treatment Plan 1 5 ASSESSMENTS - Wound and Skin Assessment / Reassessment X - Simple Wound Assessment / Reassessment - one wound 1 5 []  - Complex Wound Assessment / Reassessment - multiple wounds 0 []  - Dermatologic / Skin Assessment (not related to wound area) 0 ASSESSMENTS - Focused Assessment []  - Circumferential Edema Measurements - multi extremities 0 []  - Nutritional Assessment / Counseling / Intervention 0 X - Lower Extremity Assessment (monofilament, tuning fork, pulses) 1 5 []  - Peripheral Arterial Disease Assessment (using hand held doppler) 0 ASSESSMENTS - Ostomy and/or Continence Assessment and Care []  - Incontinence Assessment and Management 0 []  - Ostomy Care Assessment and Management (repouching, etc.) 0 PROCESS - Coordination of Care X - Simple Patient / Family Education for ongoing care 1 15 []  - Complex (extensive) Patient / Family Education for ongoing care 0 []  - Staff obtains Programmer, systems, Records, Test Results / Process Orders 0 []  - Staff telephones HHA, Nursing Homes / Clarify orders / etc 0 []  - Routine Transfer to another Facility (non-emergent condition) 0 [name], [name] ([id number]) []  - Routine Hospital Admission (non-emergent condition) 0 []  - New Admissions / Biomedical engineer / Ordering NPWT, Apligraf, etc. 0 []  - Emergency Hospital Admission (emergent condition) 0 []  - Simple Discharge Coordination 0 []  - Complex (extensive) Discharge Coordination 0 PROCESS - Special Needs []  - Pediatric / Minor Patient Management 0 []  - Isolation Patient Management 0 []  - Hearing / Language / Visual special needs 0 []  - Assessment of Community assistance (transportation, D/C planning, etc.) 0 []  - Additional assistance / Altered mentation 0 []  - Support Surface(s)  Assessment (bed, cushion, seat, etc.) 0 INTERVENTIONS - Wound Cleansing / Measurement X - Simple Wound Cleansing - one wound 1 5 []  - Complex Wound Cleansing - multiple wounds  0 X - Wound Imaging (photographs - any number of wounds) 1 5 []  - Wound Tracing (instead of photographs) 0 X - Simple Wound Measurement - one wound 1 5 []  - Complex Wound Measurement - multiple wounds 0 INTERVENTIONS - Wound Dressings X - Small Wound Dressing one or multiple wounds 1 10 []  - Medium Wound Dressing one or multiple wounds 0 []  - Large Wound Dressing one or multiple wounds 0 []  - Application of Medications - topical 0 []  - Application of Medications - injection 0 INTERVENTIONS - Miscellaneous []  - External ear exam 0 [name], [name] ([id number]) []  - Specimen Collection (cultures, biopsies, blood, body fluids, etc.) 0 []  - Specimen(s) / Culture(s) sent or taken to Lab for analysis 0 []  - Patient Transfer (multiple staff / Harrel Lemon Lift / Similar devices) 0 []  - Simple Staple / Suture removal (25 or less) 0 []  - Complex Staple / Suture removal (26 or more) 0 []  - Hypo / Hyperglycemic Management (close monitor of Blood Glucose) 0 []  - Ankle / Brachial Index (ABI) - do not check if billed separately 0 X - Vital Signs 1 5 Has the patient been seen at the hospital within the last three years: Yes Total Score: 70 Level Of Care: New/Established - Level 2 Electronic Signature(s) Signed: 10/26/2016 3:19:34 PM By: Regan Lemming BSN, RN Entered By: Regan Lemming on 10/26/2016 14:21:58 [name] ([id number]) -------------------------------------------------------------------------------- Encounter Discharge Information Details Patient Name: [name] Date of Service: 10/26/2016 2:00 PM Medical Record Number: [id number] Patient Account Number: [id number] Date of Birth/Sex: Dec 25, 1928 (83 y.o. Female) Treating RN: Baruch Gouty, RN, BSN, [facility] Primary Care Seanne Chirico: Clayborn Bigness Other Clinician: Referring  Dori Devino: Clayborn Bigness Treating Clayden Withem/Extender: Frann Rider in Treatment: 25 Encounter Discharge Information Items Schedule Follow-up Appointment: No Medication Reconciliation completed No and provided to Patient/Care Guadalupe Kerekes: Provided on Clinical Summary of Care: 10/26/2016 Form Type Recipient Paper Patient LP Electronic Signature(s) Signed: 10/26/2016 2:35:32 PM By: Ruthine Dose Entered By: Ruthine Dose on 10/26/2016 14:35:32 Bailey, [place] ([id number]) -------------------------------------------------------------------------------- Lower Extremity Assessment Details Patient Name: [name] Date of Service: 10/26/2016 2:00 PM Medical Record Number: [id number] Patient Account Number: [id number] Date of Birth/Sex: January 03, 1929 (83 y.o. Female) Treating RN: Afful, RN, BSN, [facility] Primary Care Keeara Frees: Clayborn Bigness Other Clinician: Referring Margi Edmundson: Clayborn Bigness Treating Jacinta Penalver/Extender: Frann Rider in Treatment: 25 Vascular Assessment Claudication: Claudication Assessment [Right:None] Pulses: Dorsalis Pedis Palpable: [Right:Yes] Posterior Tibial Extremity colors, hair growth, and conditions: Extremity Color: [Right:Mottled] Hair Growth on Extremity: [Right:No] Temperature of Extremity: [Right:Warm] Capillary Refill: [Right:< 3 seconds] Electronic Signature(s) Signed: 10/26/2016 3:19:34 PM By: Regan Lemming BSN, RN Entered By: Regan Lemming on 10/26/2016 14:19:34 [name] ([id number]) -------------------------------------------------------------------------------- Multi Wound Chart Details Patient Name: [name] Date of Service: 10/26/2016 2:00 PM Medical Record Number: [id number] Patient Account Number: [id number] Date of Birth/Sex: Oct 15, 1928 (83 y.o. Female) Treating RN: Baruch Gouty, RN, BSN, [employer] Primary Care Kylar Speelman: Clayborn Bigness Other Clinician: Referring Keante Urizar: Clayborn Bigness Treating Donzel Romack/Extender: Frann Rider  in Treatment: 25 Vital Signs Height(in): 60 Pulse(bpm): 80 Weight(lbs): 122 Blood Pressure 184/75 (mmHg): Body Mass Index(BMI): 24 Temperature(F): 98.1 Respiratory Rate 16 (breaths/min): Photos: [4:No Photos] [N/A:N/A] Wound Location: [4:Right Lower Leg - Anterior, Proximal] [N/A:N/A] Wounding Event: [4:Trauma] [N/A:N/A] Primary Etiology: [4:Trauma, Other] [N/A:N/A] Comorbid History: [4:Asthma, Hypertension] [N/A:N/A] Date Acquired: [4:04/21/2016] [N/A:N/A] Weeks of Treatment: [4:23] [N/A:N/A] Wound Status: [4:Open] [N/A:N/A] Measurements L x W x D 1.3x0.2x0.5 [N/A:N/A] (cm) Area (cm) : [4:0.204] [N/A:N/A] Volume (cm) : [4:0.102] [N/A:N/A] % Reduction in Area: [4:99.30%] [N/A:N/A] %  Reduction in Volume: 99.80% [N/A:N/A] Classification: [4:Full Thickness Without Exposed Support Structures] [N/A:N/A] Exudate Amount: [4:Large] [N/A:N/A] Exudate Type: [4:Serous] [N/A:N/A] Exudate Color: [4:amber] [N/A:N/A] Foul Odor After [4:Yes] [N/A:N/A] Cleansing: Odor Anticipated Due to No [N/A:N/A] Product Use: Wound Margin: [4:Flat and Intact] [N/A:N/A] Granulation Amount: [4:Large (67-100%)] [N/A:N/A] Granulation Quality: [4:Red] [N/A:N/A] Necrotic Amount: [4:Small (1-33%)] [N/A:N/A] Exposed Structures: [N/A:N/A] Fascia: No Fat Layer (Subcutaneous Tissue) Exposed: No Tendon: No Muscle: No Joint: No Bone: No Limited to Skin Breakdown Epithelialization: None N/A N/A Periwound Skin Texture: Excoriation: No N/A N/A Induration: No Callus: No Crepitus: No Rash: No Scarring: No Periwound Skin Maceration: No N/A N/A Moisture: Dry/Scaly: No Periwound Skin Color: Atrophie Blanche: No N/A N/A Cyanosis: No Ecchymosis: No Erythema: No Hemosiderin Staining: No Mottled: No Pallor: No Rubor: No Temperature: No Abnormality N/A N/A Tenderness on Yes N/A N/A Palpation: Wound Preparation: Ulcer Cleansing: N/A N/A Rinsed/Irrigated with Saline Topical Anesthetic Applied:  Other: lidocaine 4% Treatment Notes Electronic Signature(s) Signed: 10/26/2016 2:42:07 PM By: Christin Fudge MD, FACS Entered By: Christin Fudge on 10/26/2016 14:42:07 [name], [name] ([id number]) -------------------------------------------------------------------------------- [place] Details Patient Name: [name] Date of Service: 10/26/2016 2:00 PM Medical Record Number: [id number] Patient Account Number: [id number] Date of Birth/Sex: September 02, 1928 (83 y.o. Female) Treating RN: Baruch Gouty, RN, BSN, [employer] Primary Care Lyanne Kates: Clayborn Bigness Other Clinician: Referring Jalene Lacko: Clayborn Bigness Treating Earnie Bechard/Extender: Frann Rider in Treatment: 25 Active Inactive ` Abuse / Safety / Falls / Self Care Management Nursing Diagnoses: Impaired physical mobility Potential for falls Goals: Patient will remain injury free Date Initiated: 05/04/2016 Target Resolution Date: 07/25/2016 Goal Status: Active Interventions: Assess fall risk on admission and as needed Notes: ` Orientation to the Wound Care Program Nursing Diagnoses: Knowledge deficit related to the wound healing center program Goals: Patient/caregiver will verbalize understanding of the [place] Date Initiated: 05/04/2016 Target Resolution Date: 07/25/2016 Goal Status: Active Interventions: Provide education on orientation to the wound center Notes: ` Pain, Acute or Chronic Nursing Diagnoses: Pain, acute or chronic: actual or potential [name], [name] ([id number]) Goals: Patient/caregiver will verbalize adequate pain control between visits Date Initiated: 05/04/2016 Target Resolution Date: 07/25/2016 Goal Status: Active Interventions: Complete pain assessment as per visit requirements Notes: ` Wound/Skin Impairment Nursing Diagnoses: Impaired tissue integrity Goals: Patient/caregiver will verbalize understanding of skin care regimen Date Initiated:  05/04/2016 Target Resolution Date: 07/25/2016 Goal Status: Active Ulcer/skin breakdown will have a volume reduction of 30% by week 4 Date Initiated: 05/04/2016 Target Resolution Date: 07/25/2016 Goal Status: Active Ulcer/skin breakdown will have a volume reduction of 50% by week 8 Date Initiated: 05/04/2016 Target Resolution Date: 08/22/2016 Goal Status: Active Ulcer/skin breakdown will have a volume reduction of 80% by week 12 Date Initiated: 05/04/2016 Target Resolution Date: 08/22/2016 Goal Status: Active Ulcer/skin breakdown will heal within 14 weeks Date Initiated: 05/04/2016 Target Resolution Date: 09/05/2016 Goal Status: Active Interventions: Assess patient/caregiver ability to obtain necessary supplies Assess patient/caregiver ability to perform ulcer/skin care regimen upon admission and as needed Assess ulceration(s) every visit Notes: Electronic Signature(s) Signed: 10/26/2016 3:19:34 PM By: Regan Lemming BSN, RN Entered By: Regan Lemming on 10/26/2016 14:19:44 [name] ([id number]) -------------------------------------------------------------------------------- Pain Assessment Details Patient Name: [name] Date of Service: 10/26/2016 2:00 PM Medical Record Number: [id number] Patient Account Number: [id number] Date of Birth/Sex: 1929-05-12 (83 y.o. Female) Treating RN: Baruch Gouty, RN, BSN, [facility] Primary Care Rohil Lesch: Clayborn Bigness Other Clinician: Referring Yentl Verge: Clayborn Bigness Treating Kenadi Miltner/Extender: Frann Rider in Treatment: 25 Active Problems Location of Pain Severity and Description  of Pain Patient Has Paino No Site Locations With Dressing Change: No Pain Management and Medication Current Pain Management: Electronic Signature(s) Signed: 10/26/2016 3:19:34 PM By: Regan Lemming BSN, RN Entered By: Regan Lemming on 10/26/2016 14:10:11 [name] ([id number]) -------------------------------------------------------------------------------- Wound  Assessment Details Patient Name: [name] Date of Service: 10/26/2016 2:00 PM Medical Record Number: [id number] Patient Account Number: [id number] Date of Birth/Sex: 10-Feb-1929 (83 y.o. Female) Treating RN: Afful, RN, BSN, [facility] Primary Care Shoshana Johal: Clayborn Bigness Other Clinician: Referring Janita Camberos: Clayborn Bigness Treating Ilianna Bown/Extender: Frann Rider in Treatment: 25 Wound Status Wound Number: 4 Primary Etiology: Trauma, Other Wound Location: Right Lower Leg - Anterior, Wound Status: Open Proximal Comorbid History: Asthma, Hypertension Wounding Event: Trauma Date Acquired: 04/21/2016 Weeks Of Treatment: 23 Clustered Wound: No Photos Photo Uploaded By: Regan Lemming on 10/26/2016 15:19:19 Wound Measurements Length: (cm) 1.3 Width: (cm) 0.2 Depth: (cm) 0.5 Area: (cm) 0.204 Volume: (cm) 0.102 % Reduction in Area: 99.3% % Reduction in Volume: 99.8% Epithelialization: None Tunneling: No Undermining: No Wound Description Full Thickness Without Exposed Classification: Support Structures Wound Margin: Flat and Intact Large [name], [name] ([id number]) Foul Odor After Cleansing: Yes Due to Product Use: No Slough/Fibrino No Exudate Amount: Exudate Type: Serous Exudate Color: amber Wound Bed Granulation Amount: Large (67-100%) Exposed Structure Granulation Quality: Red Fascia Exposed: No Necrotic Amount: Small (1-33%) Fat Layer (Subcutaneous Tissue) Exposed: No Necrotic Quality: Adherent Slough Tendon Exposed: No Muscle Exposed: No Joint Exposed: No Bone Exposed: No Limited to Skin Breakdown Periwound Skin Texture Texture Color No Abnormalities Noted: No No Abnormalities Noted: No Callus: No Atrophie Blanche: No Crepitus: No Cyanosis: No Excoriation: No Ecchymosis: No Induration: No Erythema: No Rash: No Hemosiderin Staining: No Scarring: No Mottled: No Pallor: No Moisture Rubor: No No Abnormalities Noted: No Dry / Scaly: No  Temperature / Pain Maceration: No Temperature: No Abnormality Tenderness on Palpation: Yes Wound Preparation Ulcer Cleansing: Rinsed/Irrigated with Saline Topical Anesthetic Applied: Other: lidocaine 4%, Electronic Signature(s) Signed: 10/26/2016 3:19:34 PM By: Regan Lemming BSN, RN Entered By: Regan Lemming on 10/26/2016 14:16:07 [name] ([id number]) -------------------------------------------------------------------------------- Vitals Details Patient Name: [name] Date of Service: 10/26/2016 2:00 PM Medical Record Number: [id number] Patient Account Number: [id number] Date of Birth/Sex: 02-11-1929 (83 y.o. Female) Treating RN: Afful, RN, BSN, [place] Primary Care Nickolis Diel: Clayborn Bigness Other Clinician: Referring Maricela Schreur: Clayborn Bigness Treating Takeisha Cianci/Extender: Frann Rider in Treatment: 25 Vital Signs Time Taken: 14:10 Temperature (F): 98.1 Height (in): 60 Pulse (bpm): 80 Weight (lbs): 122 Respiratory Rate (breaths/min): 16 Body Mass Index (BMI): 23.8 Blood Pressure (mmHg): 184/75 Reference Range: 80 - 120 mg / dl Electronic Signature(s) Signed: 10/26/2016 3:19:34 PM By: Regan Lemming BSN, RN Entered By: Regan Lemming on 10/26/2016 14:10:59
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
## 2016-11-03 ENCOUNTER — Encounter: Payer: Medicare Other | Attending: Surgery | Admitting: Surgery

## 2016-11-03 DIAGNOSIS — X58XXXA Exposure to other specified factors, initial encounter: Secondary | ICD-10-CM | POA: Insufficient documentation

## 2016-11-03 DIAGNOSIS — L97312 Non-pressure chronic ulcer of right ankle with fat layer exposed: Secondary | ICD-10-CM | POA: Insufficient documentation

## 2016-11-03 DIAGNOSIS — F419 Anxiety disorder, unspecified: Secondary | ICD-10-CM | POA: Insufficient documentation

## 2016-11-03 DIAGNOSIS — J45909 Unspecified asthma, uncomplicated: Secondary | ICD-10-CM | POA: Diagnosis not present

## 2016-11-03 DIAGNOSIS — I739 Peripheral vascular disease, unspecified: Secondary | ICD-10-CM | POA: Insufficient documentation

## 2016-11-03 DIAGNOSIS — S81811A Laceration without foreign body, right lower leg, initial encounter: Secondary | ICD-10-CM | POA: Insufficient documentation

## 2016-11-03 DIAGNOSIS — R011 Cardiac murmur, unspecified: Secondary | ICD-10-CM | POA: Diagnosis not present

## 2016-11-03 DIAGNOSIS — I351 Nonrheumatic aortic (valve) insufficiency: Secondary | ICD-10-CM | POA: Insufficient documentation

## 2016-11-03 DIAGNOSIS — C44722 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, including hip: Secondary | ICD-10-CM | POA: Diagnosis not present

## 2016-11-03 DIAGNOSIS — I10 Essential (primary) hypertension: Secondary | ICD-10-CM | POA: Insufficient documentation

## 2016-11-03 DIAGNOSIS — C44729 Squamous cell carcinoma of skin of left lower limb, including hip: Secondary | ICD-10-CM | POA: Diagnosis not present

## 2016-11-05 NOTE — Progress Notes (Signed)
[name], [name] ([id number]) Visit Report for 11/03/2016 Arrival Information Details Patient Name: [name], [name] Date of Service: 11/03/2016 1:30 PM Medical Record Number: [id number] Patient Account Number: [id number] Date of Birth/Sex: [birth year] (83 y.o. Female) Treating RN: Montey Hora Primary Care Rakisha Pincock: Clayborn Bigness Other Clinician: Referring Catriona Dillenbeck: Clayborn Bigness Treating Kewana Sanon/Extender: Frann Rider in Treatment: 26 Visit Information History Since Last Visit Added or deleted any medications: No Patient Arrived: Ambulatory Any new allergies or adverse reactions: No Arrival Time: 13:35 Had a fall or experienced change in No Accompanied By: self activities of daily living that may affect Transfer Assistance: None risk of falls: Patient Identification Verified: Yes Signs or symptoms of abuse/neglect since last No Secondary Verification Process Yes visito Completed: Hospitalized since last visit: No Patient Requires Transmission-Based No Has Dressing in Place as Prescribed: Yes Precautions: Pain Present Now: Yes Patient Has Alerts: No Electronic Signature(s) Signed: 11/03/2016 4:31:09 PM By: Montey Hora Entered By: Montey Hora on 11/03/2016 13:36:03 [name] ([id number]) -------------------------------------------------------------------------------- Clinic Level of Care Assessment Details Patient Name: [name] Date of Service: 11/03/2016 1:30 PM Medical Record Number: [id number] Patient Account Number: [id number] Date of Birth/Sex: 1928-07-05 (83 y.o. Female) Treating RN: Montey Hora Primary Care Ellise Kovack: Clayborn Bigness Other Clinician: Referring Shiesha Jahn: Clayborn Bigness Treating Slayton Lubitz/Extender: Frann Rider in Treatment: 26 Clinic Level of Care Assessment Items TOOL 4 Quantity Score []  - Use when only an EandM is performed on FOLLOW-UP visit 0 ASSESSMENTS - Nursing Assessment / Reassessment X - Reassessment of  Co-morbidities (includes updates in patient status) 1 10 X - Reassessment of Adherence to Treatment Plan 1 5 ASSESSMENTS - Wound and Skin Assessment / Reassessment X - Simple Wound Assessment / Reassessment - one wound 1 5 []  - Complex Wound Assessment / Reassessment - multiple wounds 0 []  - Dermatologic / Skin Assessment (not related to wound area) 0 ASSESSMENTS - Focused Assessment []  - Circumferential Edema Measurements - multi extremities 0 []  - Nutritional Assessment / Counseling / Intervention 0 X - Lower Extremity Assessment (monofilament, tuning fork, pulses) 1 5 []  - Peripheral Arterial Disease Assessment (using hand held doppler) 0 ASSESSMENTS - Ostomy and/or Continence Assessment and Care []  - Incontinence Assessment and Management 0 []  - Ostomy Care Assessment and Management (repouching, etc.) 0 PROCESS - Coordination of Care X - Simple Patient / Family Education for ongoing care 1 15 []  - Complex (extensive) Patient / Family Education for ongoing care 0 []  - Staff obtains Programmer, systems, Records, Test Results / Process Orders 0 []  - Staff telephones HHA, Nursing Homes / Clarify orders / etc 0 []  - Routine Transfer to another Facility (non-emergent condition) 0 [name], [name] ([id number]) []  - Routine Hospital Admission (non-emergent condition) 0 []  - New Admissions / Biomedical engineer / Ordering NPWT, Apligraf, etc. 0 []  - Emergency Hospital Admission (emergent condition) 0 X - Simple Discharge Coordination 1 10 []  - Complex (extensive) Discharge Coordination 0 PROCESS - Special Needs []  - Pediatric / Minor Patient Management 0 []  - Isolation Patient Management 0 []  - Hearing / Language / Visual special needs 0 []  - Assessment of Community assistance (transportation, D/C planning, etc.) 0 []  - Additional assistance / Altered mentation 0 []  - Support Surface(s) Assessment (bed, cushion, seat, etc.) 0 INTERVENTIONS - Wound Cleansing / Measurement X - Simple Wound  Cleansing - one wound 1 5 []  - Complex Wound Cleansing - multiple wounds 0 X - Wound Imaging (photographs - any number of wounds) 1 5 []  - Wound Tracing (instead of photographs)  0 X - Simple Wound Measurement - one wound 1 5 []  - Complex Wound Measurement - multiple wounds 0 INTERVENTIONS - Wound Dressings []  - Small Wound Dressing one or multiple wounds 0 X - Medium Wound Dressing one or multiple wounds 1 15 []  - Large Wound Dressing one or multiple wounds 0 []  - Application of Medications - topical 0 []  - Application of Medications - injection 0 INTERVENTIONS - Miscellaneous []  - External ear exam 0 [name], [name] ([id number]) []  - Specimen Collection (cultures, biopsies, blood, body fluids, etc.) 0 []  - Specimen(s) / Culture(s) sent or taken to Lab for analysis 0 []  - Patient Transfer (multiple staff / Harrel Lemon Lift / Similar devices) 0 []  - Simple Staple / Suture removal (25 or less) 0 []  - Complex Staple / Suture removal (26 or more) 0 []  - Hypo / Hyperglycemic Management (close monitor of Blood Glucose) 0 []  - Ankle / Brachial Index (ABI) - do not check if billed separately 0 X - Vital Signs 1 5 Has the patient been seen at the hospital within the last three years: Yes Total Score: 85 Level Of Care: New/Established - Level 3 Electronic Signature(s) Signed: 11/03/2016 4:31:09 PM By: Montey Hora Entered By: Montey Hora on 11/03/2016 16:03:37 [name] ([id number]) -------------------------------------------------------------------------------- Encounter Discharge Information Details Patient Name: [name] Date of Service: 11/03/2016 1:30 PM Medical Record Number: [id number] Patient Account Number: [id number] Date of Birth/Sex: Jun 29, 1928 (83 y.o. Female) Treating RN: Montey Hora Primary Care Tyrelle Raczka: Clayborn Bigness Other Clinician: Referring Jmya Uliano: Clayborn Bigness Treating Clare Fennimore/Extender: Frann Rider in Treatment: 79 Encounter Discharge  Information Items Discharge Pain Level: 0 Discharge Condition: Stable Ambulatory Status: Ambulatory Discharge Destination: Home Transportation: Private Auto Accompanied By: self Schedule Follow-up Appointment: Yes Medication Reconciliation completed and provided to Patient/Care No [name]: Provided on Clinical Summary of Care: 11/03/2016 Form Type Recipient Paper Patient LP Electronic Signature(s) Signed: 11/03/2016 2:45:00 PM By: Sharon Mt Entered By: Sharon Mt on 11/03/2016 14:45:00 [name] ([id number]) -------------------------------------------------------------------------------- General Visit Notes Details Patient Name: [name] Date of Service: 11/03/2016 1:30 PM Medical Record Number: [id number] Patient Account Number: [id number] Date of Birth/Sex: [birth year] (83 y.o. Female) Treating RN: Montey Hora Primary Care Neta Upadhyay: Clayborn Bigness Other Clinician: Referring Evelynne Spiers: Clayborn Bigness Treating Kramer Hanrahan/Extender: Frann Rider in Treatment: 26 Notes Prior to patient's appointment I spoke with Dr Con Memos about patient's legs having multiple open areas. One in particular is of concern to patient and her daughter because it smells. I asked Dr Con Memos if I should establish these areas as wounds. Dr Con Memos states that even though these areas may be open, they are skin cancers and patient is under the treatment of 2 dermatologists for her skin cancer so our treatment would not be of any benefit. He also states that we can advise patient and her daughter that if the smell bothers them that they can purchase carboflex from [place] or another online store to help with the odor. Patient's daughter was not present today for her visit but Dr Con Memos and I both educated patient about this and I wrote a note to patient's daughter stating that carboflex helps with drainage odor and can be purchased online on [employer] or comparable websites. Electronic  Signature(s) Signed: 11/03/2016 4:10:45 PM By: Montey Hora Entered By: Montey Hora on 11/03/2016 16:10:45 [name] ([id number]) -------------------------------------------------------------------------------- Lower Extremity Assessment Details Patient Name: [name] Date of Service: 11/03/2016 1:30 PM Medical Record Number: [id number] Patient Account Number: [id number] Date of Birth/Sex: 26-Feb-1929 (83 y.o. Female)  Treating RN: Montey Hora Primary Care Suesan Mohrmann: Clayborn Bigness Other Clinician: Referring Eilam Shrewsbury: Clayborn Bigness Treating Merril Isakson/Extender: Frann Rider in Treatment: 26 Vascular Assessment Pulses: Dorsalis Pedis Palpable: [Right:Yes] Posterior Tibial Extremity colors, hair growth, and conditions: Extremity Color: [Right:Mottled] Hair Growth on Extremity: [Right:No] Temperature of Extremity: [Right:Warm] Capillary Refill: [Right:< 3 seconds] Electronic Signature(s) Signed: 11/03/2016 4:31:09 PM By: Montey Hora Entered By: Montey Hora on 11/03/2016 13:49:26 [place], [name] ([id number]) -------------------------------------------------------------------------------- Multi Wound Chart Details Patient Name: [name] Date of Service: 11/03/2016 1:30 PM Medical Record Number: [id number] Patient Account Number: [id number] Date of Birth/Sex: May 02, 1929 (83 y.o. Female) Treating RN: Montey Hora Primary Care Ticia Virgo: Clayborn Bigness Other Clinician: Referring Tupac Jeffus: Clayborn Bigness Treating Eilidh Marcano/Extender: Frann Rider in Treatment: 26 Vital Signs Height(in): 60 Pulse(bpm): 82 Weight(lbs): 122 Blood Pressure 166/68 (mmHg): Body Mass Index(BMI): 24 Temperature(F): 98.2 Respiratory Rate 16 (breaths/min): Photos: [4:No Photos] [N/A:N/A] Wound Location: [4:Right Lower Leg - Anterior, Proximal] [N/A:N/A] Wounding Event: [4:Trauma] [N/A:N/A] Primary Etiology: [4:Trauma, Other] [N/A:N/A] Comorbid History: [4:Asthma,  Hypertension] [N/A:N/A] Date Acquired: [4:04/21/2016] [N/A:N/A] Weeks of Treatment: [4:24] [N/A:N/A] Wound Status: [4:Open] [N/A:N/A] Measurements L x W x D 0.9x0.5x0.5 [N/A:N/A] (cm) Area (cm) : [4:0.353] [N/A:N/A] Volume (cm) : [4:0.177] [N/A:N/A] % Reduction in Area: [4:98.80%] [N/A:N/A] % Reduction in Volume: 99.60% [N/A:N/A] Starting Position 1 9 (o'clock): Ending Position 1 [4:1] (o'clock): Maximum Distance 1 0.5 (cm): Undermining: [4:Yes] [N/A:N/A] Classification: [4:Full Thickness Without Exposed Support Structures] [N/A:N/A] Exudate Amount: [4:Large] [N/A:N/A] Exudate Type: [4:Sanguinous] [N/A:N/A] Exudate Color: [4:red] [N/A:N/A] Foul Odor After [4:Yes] [N/A:N/A] Cleansing: Balderson, Emrey ([id number]) Odor Anticipated Due to No N/A N/A Product Use: Wound Margin: Flat and Intact N/A N/A Granulation Amount: Large (67-100%) N/A N/A Granulation Quality: Red N/A N/A Necrotic Amount: Small (1-33%) N/A N/A Exposed Structures: Fascia: No N/A N/A Fat Layer (Subcutaneous Tissue) Exposed: No Tendon: No Muscle: No Joint: No Bone: No Limited to Skin Breakdown Epithelialization: None N/A N/A Periwound Skin Texture: Excoriation: No N/A N/A Induration: No Callus: No Crepitus: No Rash: No Scarring: No Periwound Skin Maceration: No N/A N/A Moisture: Dry/Scaly: No Periwound Skin Color: Atrophie Blanche: No N/A N/A Cyanosis: No Ecchymosis: No Erythema: No Hemosiderin Staining: No Mottled: No Pallor: No Rubor: No Temperature: No Abnormality N/A N/A Tenderness on Yes N/A N/A Palpation: Wound Preparation: Ulcer Cleansing: N/A N/A Rinsed/Irrigated with Saline Topical Anesthetic Applied: Other: lidocaine 4% Treatment Notes Electronic Signature(s) Signed: 11/03/2016 2:25:07 PM By: Christin Fudge MD, FACS Entered By: Christin Fudge on 11/03/2016 14:25:07 [name], [name] ([id number]) [name], [name]  ([id number]) -------------------------------------------------------------------------------- [place] Details Patient Name: [name] Date of Service: 11/03/2016 1:30 PM Medical Record Number: [id number] Patient Account Number: [id number] Date of Birth/Sex: Aug 25, 1928 (83 y.o. Female) Treating RN: Montey Hora Primary Care Lilie Vezina: Clayborn Bigness Other Clinician: Referring Rylan Bernard: Clayborn Bigness Treating Myria Steenbergen/Extender: Frann Rider in Treatment: 33 Active Inactive ` Abuse / Safety / Falls / Self Care Management Nursing Diagnoses: Impaired physical mobility Potential for falls Goals: Patient will remain injury free Date Initiated: 05/04/2016 Target Resolution Date: 07/25/2016 Goal Status: Active Interventions: Assess fall risk on admission and as needed Notes: ` Orientation to the Wound Care Program Nursing Diagnoses: Knowledge deficit related to the wound healing center program Goals: Patient/caregiver will verbalize understanding of the [place] Date Initiated: 05/04/2016 Target Resolution Date: 07/25/2016 Goal Status: Active Interventions: Provide education on orientation to the wound center Notes: ` Pain, Acute or Chronic Nursing Diagnoses: Pain, acute or chronic: actual or potential [name], [name] ([id number]) Goals: Patient/caregiver will verbalize  adequate pain control between visits Date Initiated: 05/04/2016 Target Resolution Date: 07/25/2016 Goal Status: Active Interventions: Complete pain assessment as per visit requirements Notes: ` Wound/Skin Impairment Nursing Diagnoses: Impaired tissue integrity Goals: Patient/caregiver will verbalize understanding of skin care regimen Date Initiated: 05/04/2016 Target Resolution Date: 07/25/2016 Goal Status: Active Ulcer/skin breakdown will have a volume reduction of 30% by week 4 Date Initiated: 05/04/2016 Target Resolution Date: 07/25/2016 Goal Status:  Active Ulcer/skin breakdown will have a volume reduction of 50% by week 8 Date Initiated: 05/04/2016 Target Resolution Date: 08/22/2016 Goal Status: Active Ulcer/skin breakdown will have a volume reduction of 80% by week 12 Date Initiated: 05/04/2016 Target Resolution Date: 08/22/2016 Goal Status: Active Ulcer/skin breakdown will heal within 14 weeks Date Initiated: 05/04/2016 Target Resolution Date: 09/05/2016 Goal Status: Active Interventions: Assess patient/caregiver ability to obtain necessary supplies Assess patient/caregiver ability to perform ulcer/skin care regimen upon admission and as needed Assess ulceration(s) every visit Notes: Electronic Signature(s) Signed: 11/03/2016 4:31:09 PM By: Montey Hora Entered By: Montey Hora on 11/03/2016 13:49:33 [place], [name] ([id number]) -------------------------------------------------------------------------------- Pain Assessment Details Patient Name: [name] Date of Service: 11/03/2016 1:30 PM Medical Record Number: [id number] Patient Account Number: [id number] Date of Birth/Sex: [birth year] (83 y.o. Female) Treating RN: Montey Hora Primary Care Burgundy Matuszak: Clayborn Bigness Other Clinician: Referring Suki Crockett: Clayborn Bigness Treating Jakayla Schweppe/Extender: Frann Rider in Treatment: 26 Active Problems Location of Pain Severity and Description of Pain Patient Has Paino Yes Site Locations Pain Location: Generalized Pain, Pain in Ulcers With Dressing Change: No Duration of the Pain. Constant / Intermittento Constant Pain Management and Medication Current Pain Management: Notes Topical or injectable lidocaine is offered to patient for acute pain when surgical debridement is performed. If needed, Patient is instructed to use over the counter pain medication for the following 24-48 hours after debridement. Wound care MDs do not prescribed pain medications. Patient has chronic pain or uncontrolled pain. Patient has been  instructed to make an appointment with their Primary Care Physician for pain management. Electronic Signature(s) Signed: 11/03/2016 4:31:09 PM By: Montey Hora Entered By: Montey Hora on 11/03/2016 13:36:25 [name] ([id number]) -------------------------------------------------------------------------------- Patient/Caregiver Education Details Patient Name: [name] Date of Service: 11/03/2016 1:30 PM Medical Record Number: [id number] Patient Account Number: [id number] Date of Birth/Gender: 1928/11/26 (83 y.o. Female) Treating RN: Montey Hora Primary Care Physician: Clayborn Bigness Other Clinician: Referring Physician: Clayborn Bigness Treating Physician/Extender: Frann Rider in Treatment: 70 Education Assessment Education Provided To: Patient Education Topics Provided Wound/Skin Impairment: Handouts: Other: wound care as ordered Methods: Demonstration, Explain/Verbal Responses: State content correctly Electronic Signature(s) Signed: 11/03/2016 4:31:09 PM By: Montey Hora Entered By: Montey Hora on 11/03/2016 13:50:32 [name], [name] ([id number]) -------------------------------------------------------------------------------- Wound Assessment Details Patient Name: [name] Date of Service: 11/03/2016 1:30 PM Medical Record Number: [id number] Patient Account Number: [id number] Date of Birth/Sex: 1928-06-30 (83 y.o. Female) Treating RN: Montey Hora Primary Care Charletta Voight: Clayborn Bigness Other Clinician: Referring Hettie Roselli: Clayborn Bigness Treating Channing Yeager/Extender: Frann Rider in Treatment: 26 Wound Status Wound Number: 4 Primary Etiology: Trauma, Other Wound Location: Right Lower Leg - Anterior, Wound Status: Open Proximal Comorbid History: Asthma, Hypertension Wounding Event: Trauma Date Acquired: 04/21/2016 Weeks Of Treatment: 24 Clustered Wound: No Photos Photo Uploaded By: Montey Hora on 11/03/2016 16:13:10 Wound  Measurements Length: (cm) 0.9 Width: (cm) 0.5 Depth: (cm) 0.5 Area: (cm) 0.353 Volume: (cm) 0.177 % Reduction in Area: 98.8% % Reduction in Volume: 99.6% Epithelialization: None Tunneling: No Undermining: Yes Starting Position (o'clock): 9 Ending Position (o'clock): 1 Maximum Distance: (cm) 0.5  Wound Description Full Thickness Without Exposed Classification: Support Structures Wound Margin: Flat and Intact Exudate Large Amount: Exudate Type: Sanguinous [name], [name] ([id number]) Foul Odor After Cleansing: Yes Due to Product Use: No Slough/Fibrino No Exudate Color: red Wound Bed Granulation Amount: Large (67-100%) Exposed Structure Granulation Quality: Red Fascia Exposed: No Necrotic Amount: Small (1-33%) Fat Layer (Subcutaneous Tissue) Exposed: No Necrotic Quality: Adherent Slough Tendon Exposed: No Muscle Exposed: No Joint Exposed: No Bone Exposed: No Limited to Skin Breakdown Periwound Skin Texture Texture Color No Abnormalities Noted: No No Abnormalities Noted: No Callus: No Atrophie Blanche: No Crepitus: No Cyanosis: No Excoriation: No Ecchymosis: No Induration: No Erythema: No Rash: No Hemosiderin Staining: No Scarring: No Mottled: No Pallor: No Moisture Rubor: No No Abnormalities Noted: No Dry / Scaly: No Temperature / Pain Maceration: No Temperature: No Abnormality Tenderness on Palpation: Yes Wound Preparation Ulcer Cleansing: Rinsed/Irrigated with Saline Topical Anesthetic Applied: Other: lidocaine 4%, Treatment Notes Wound #4 (Right, Proximal, Anterior Lower Leg) 1. Cleansed with: Clean wound with Normal Saline 2. Anesthetic Topical Lidocaine 4% cream to wound bed prior to debridement 4. Dressing Applied: Iodoform packing Gauze 5. Secondary Dressing Applied Kerlix/Conform Non-Adherent pad 7. Secured with Recruitment consultant) Signed: 11/03/2016 4:31:09 PM By: Peggyann Juba, [name]  ([id number]) Entered By: Montey Hora on 11/03/2016 13:48:15 SIMMONE, CAPE ([id number]) -------------------------------------------------------------------------------- Vitals Details Patient Name: [name] Date of Service: 11/03/2016 1:30 PM Medical Record Number: [id number] Patient Account Number: [id number] Date of Birth/Sex: [birth year] (83 y.o. Female) Treating RN: Montey Hora Primary Care Randale Carvalho: Clayborn Bigness Other Clinician: Referring Torra Pala: Clayborn Bigness Treating Adelei Scobey/Extender: Frann Rider in Treatment: 26 Vital Signs Time Taken: 13:39 Temperature (F): 98.2 Height (in): 60 Pulse (bpm): 82 Weight (lbs): 122 Respiratory Rate (breaths/min): 16 Body Mass Index (BMI): 23.8 Blood Pressure (mmHg): 166/68 Reference Range: 80 - 120 mg / dl Electronic Signature(s) Signed: 11/03/2016 4:31:09 PM By: Montey Hora Entered By: Montey Hora on 11/03/2016 13:39:51
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
## 2016-11-09 ENCOUNTER — Encounter: Payer: Medicare Other | Admitting: Surgery

## 2016-11-09 DIAGNOSIS — C44722 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, including hip: Secondary | ICD-10-CM | POA: Diagnosis not present
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
## 2016-11-11 NOTE — Progress Notes (Signed)
BANESA, TRISTAN ([id number]) Visit Report for 11/09/2016 Arrival Information Details Patient Name: NYEEMAH, JENNETTE Date of Service: 11/09/2016 2:30 PM Medical Record Number: [id number] Patient Account Number: [id number] Date of Birth/Sex: 1929/05/24 (83 y.o. Female) Treating RN: Montey Hora Primary Care Thao Vanover: Clayborn Bigness Other Clinician: Referring Abdalla Naramore: Clayborn Bigness Treating Mykelle Cockerell/Extender: Frann Rider in Treatment: 7 Visit Information History Since Last Visit Added or deleted any medications: No Patient Arrived: Ambulatory Any new allergies or adverse reactions: No Arrival Time: 14:33 Had a fall or experienced change in No Accompanied By: dtr activities of daily living that may affect Transfer Assistance: None risk of falls: Patient Identification Verified: Yes Signs or symptoms of abuse/neglect since last No Secondary Verification Process Yes visito Completed: Hospitalized since last visit: No Patient Requires Transmission-Based No Has Dressing in Place as Prescribed: Yes Precautions: Pain Present Now: Yes Patient Has Alerts: No Electronic Signature(s) Signed: 11/09/2016 5:57:13 PM By: Montey Hora Entered By: Montey Hora on 11/09/2016 14:33:35 [name] ([id number]) -------------------------------------------------------------------------------- Clinic Level of Care Assessment Details Patient Name: [name] Date of Service: 11/09/2016 2:30 PM Medical Record Number: [id number] Patient Account Number: [id number] Date of Birth/Sex: Aug 16, 1928 (83 y.o. Female) Treating RN: Montey Hora Primary Care Jamia Hoban: Clayborn Bigness Other Clinician: Referring Jahzier Villalon: Clayborn Bigness Treating Lovada Barwick/Extender: Frann Rider in Treatment: 3 Clinic Level of Care Assessment Items TOOL 4 Quantity Score []  - Use when only an EandM is performed on FOLLOW-UP visit 0 ASSESSMENTS - Nursing Assessment / Reassessment X - Reassessment of  Co-morbidities (includes updates in patient status) 1 10 X - Reassessment of Adherence to Treatment Plan 1 5 ASSESSMENTS - Wound and Skin Assessment / Reassessment X - Simple Wound Assessment / Reassessment - one wound 1 5 []  - Complex Wound Assessment / Reassessment - multiple wounds 0 []  - Dermatologic / Skin Assessment (not related to wound area) 0 ASSESSMENTS - Focused Assessment []  - Circumferential Edema Measurements - multi extremities 0 []  - Nutritional Assessment / Counseling / Intervention 0 X - Lower Extremity Assessment (monofilament, tuning fork, pulses) 1 5 []  - Peripheral Arterial Disease Assessment (using hand held doppler) 0 ASSESSMENTS - Ostomy and/or Continence Assessment and Care []  - Incontinence Assessment and Management 0 []  - Ostomy Care Assessment and Management (repouching, etc.) 0 PROCESS - Coordination of Care X - Simple Patient / Family Education for ongoing care 1 15 []  - Complex (extensive) Patient / Family Education for ongoing care 0 []  - Staff obtains Programmer, systems, Records, Test Results / Process Orders 0 []  - Staff telephones HHA, Nursing Homes / Clarify orders / etc 0 []  - Routine Transfer to another Facility (non-emergent condition) 0 Cogburn, Zenya ([id number]) []  - Routine Hospital Admission (non-emergent condition) 0 []  - New Admissions / Biomedical engineer / Ordering NPWT, Apligraf, etc. 0 []  - Emergency Hospital Admission (emergent condition) 0 X - Simple Discharge Coordination 1 10 []  - Complex (extensive) Discharge Coordination 0 PROCESS - Special Needs []  - Pediatric / Minor Patient Management 0 []  - Isolation Patient Management 0 []  - Hearing / Language / Visual special needs 0 []  - Assessment of Community assistance (transportation, D/C planning, etc.) 0 []  - Additional assistance / Altered mentation 0 []  - Support Surface(s) Assessment (bed, cushion, seat, etc.) 0 INTERVENTIONS - Wound Cleansing / Measurement X - Simple Wound  Cleansing - one wound 1 5 []  - Complex Wound Cleansing - multiple wounds 0 X - Wound Imaging (photographs - any number of wounds) 1 5 []  - Wound Tracing (instead of photographs)  0 X - Simple Wound Measurement - one wound 1 5 []  - Complex Wound Measurement - multiple wounds 0 INTERVENTIONS - Wound Dressings X - Small Wound Dressing one or multiple wounds 1 10 []  - Medium Wound Dressing one or multiple wounds 0 []  - Large Wound Dressing one or multiple wounds 0 []  - Application of Medications - topical 0 []  - Application of Medications - injection 0 INTERVENTIONS - Miscellaneous []  - External ear exam 0 Labriola, Dore ([id number]) []  - Specimen Collection (cultures, biopsies, blood, body fluids, etc.) 0 []  - Specimen(s) / Culture(s) sent or taken to Lab for analysis 0 []  - Patient Transfer (multiple staff / Harrel Lemon Lift / Similar devices) 0 []  - Simple Staple / Suture removal (25 or less) 0 []  - Complex Staple / Suture removal (26 or more) 0 []  - Hypo / Hyperglycemic Management (close monitor of Blood Glucose) 0 []  - Ankle / Brachial Index (ABI) - do not check if billed separately 0 X - Vital Signs 1 5 Has the patient been seen at the hospital within the last three years: Yes Total Score: 80 Level Of Care: New/Established - Level 3 Electronic Signature(s) Signed: 11/09/2016 5:57:13 PM By: Montey Hora Entered By: Montey Hora on 11/09/2016 16:29:56 [name] ([id number]) -------------------------------------------------------------------------------- Encounter Discharge Information Details Patient Name: [name] Date of Service: 11/09/2016 2:30 PM Medical Record Number: [id number] Patient Account Number: [id number] Date of Birth/Sex: Feb 10, 1929 (83 y.o. Female) Treating RN: Montey Hora Primary Care Adriahna Shearman: Clayborn Bigness Other Clinician: Referring Burna Atlas: Clayborn Bigness Treating Janeese Mcgloin/Extender: Frann Rider in Treatment: 68 Encounter Discharge  Information Items Discharge Pain Level: 0 Discharge Condition: Stable Ambulatory Status: Ambulatory Discharge Destination: Home Transportation: Private Auto Accompanied By: dtr Schedule Follow-up Appointment: Yes Medication Reconciliation completed and provided to Patient/Care No Tsering Leaman: Provided on Clinical Summary of Care: 11/09/2016 Form Type Recipient Paper Patient LP Electronic Signature(s) Signed: 11/09/2016 4:30:26 PM By: Montey Hora Previous Signature: 11/09/2016 3:22:58 PM Version By: Ruthine Dose Entered By: Montey Hora on 11/09/2016 16:30:26 [name] ([id number]) -------------------------------------------------------------------------------- Lower Extremity Assessment Details Patient Name: [name] Date of Service: 11/09/2016 2:30 PM Medical Record Number: [id number] Patient Account Number: [id number] Date of Birth/Sex: 12/27/1928 (83 y.o. Female) Treating RN: Montey Hora Primary Care Liba Hulsey: Clayborn Bigness Other Clinician: Referring Jameshia Hayashida: Clayborn Bigness Treating Caydn Justen/Extender: Frann Rider in Treatment: 27 Vascular Assessment Pulses: Dorsalis Pedis Palpable: [Right:Yes] Posterior Tibial Extremity colors, hair growth, and conditions: Extremity Color: [Right:Mottled] Hair Growth on Extremity: [Right:No] Temperature of Extremity: [Right:Warm] Capillary Refill: [Right:< 3 seconds] Electronic Signature(s) Signed: 11/09/2016 5:57:13 PM By: Montey Hora Entered By: Montey Hora on 11/09/2016 15:05:13 [name] ([id number]) -------------------------------------------------------------------------------- Multi Wound Chart Details Patient Name: [name] Date of Service: 11/09/2016 2:30 PM Medical Record Number: [id number] Patient Account Number: [id number] Date of Birth/Sex: January 22, 1929 (83 y.o. Female) Treating RN: Montey Hora Primary Care Sufyan Meidinger: Clayborn Bigness Other Clinician: Referring Rakesh Dutko:  Clayborn Bigness Treating Paulanthony Gleaves/Extender: Frann Rider in Treatment: 27 Vital Signs Height(in): 60 Pulse(bpm): 72 Weight(lbs): 122 Blood Pressure 192/66 (mmHg): Body Mass Index(BMI): 24 Temperature(F): 98.4 Respiratory Rate 18 (breaths/min): Photos: [4:No Photos] [N/A:N/A] Wound Location: [4:Right, Proximal, Anterior Lower Leg] [N/A:N/A] Wounding Event: [4:Trauma] [N/A:N/A] Primary Etiology: [4:Trauma, Other] [N/A:N/A] Comorbid History: [4:Asthma, Hypertension] [N/A:N/A] Date Acquired: [4:04/21/2016] [N/A:N/A] Weeks of Treatment: [4:25] [N/A:N/A] Wound Status: [4:Healed - Epithelialized] [N/A:N/A] Measurements L x W x D 0x0x0 [N/A:N/A] (cm) Area (cm) : [4:0] [N/A:N/A] Volume (cm) : [4:0] [N/A:N/A] % Reduction in Area: [4:100.00%] [N/A:N/A] % Reduction in Volume: 100.00% [  N/A:N/A] Classification: [4:Full Thickness Without Exposed Support Structures] [N/A:N/A] Exudate Amount: [4:Large] [N/A:N/A] Exudate Type: [4:Sanguinous] [N/A:N/A] Exudate Color: [4:red] [N/A:N/A] Foul Odor After [4:Yes] [N/A:N/A] Cleansing: Odor Anticipated Due to No [N/A:N/A] Product Use: Wound Margin: [4:Flat and Intact] [N/A:N/A] Granulation Amount: [4:Large (67-100%)] [N/A:N/A] Granulation Quality: [4:Red] [N/A:N/A] Necrotic Amount: [4:Small (1-33%)] [N/A:N/A] Exposed Structures: [N/A:N/A] Fascia: No Fat Layer (Subcutaneous Tissue) Exposed: No Tendon: No Muscle: No Joint: No Bone: No Limited to Skin Breakdown Epithelialization: None N/A N/A Periwound Skin Texture: Excoriation: No N/A N/A Induration: No Callus: No Crepitus: No Rash: No Scarring: No Periwound Skin Maceration: No N/A N/A Moisture: Dry/Scaly: No Periwound Skin Color: Atrophie Blanche: No N/A N/A Cyanosis: No Ecchymosis: No Erythema: No Hemosiderin Staining: No Mottled: No Pallor: No Rubor: No Temperature: No Abnormality N/A N/A Tenderness on Yes N/A N/A Palpation: Wound Preparation: Ulcer Cleansing:  N/A N/A Rinsed/Irrigated with Saline Topical Anesthetic Applied: Other: lidocaine 4% Treatment Notes Electronic Signature(s) Signed: 11/09/2016 4:10:44 PM By: Christin Fudge MD, FACS Entered By: Christin Fudge on 11/09/2016 16:10:44 GENESEE, NASE ([id number]) -------------------------------------------------------------------------------- [place] Details Patient Name: [name] Date of Service: 11/09/2016 2:30 PM Medical Record Number: [id number] Patient Account Number: [id number] Date of Birth/Sex: January 15, 1929 (83 y.o. Female) Treating RN: Montey Hora Primary Care Ezariah Nace: Clayborn Bigness Other Clinician: Referring Amberlin Utke: Clayborn Bigness Treating Akiko Schexnider/Extender: Frann Rider in Treatment: 21 Active Inactive Electronic Signature(s) Signed: 11/09/2016 5:57:13 PM By: Montey Hora Entered By: Montey Hora on 11/09/2016 15:07:24 [name] ([id number]) -------------------------------------------------------------------------------- Pain Assessment Details Patient Name: [name] Date of Service: 11/09/2016 2:30 PM Medical Record Number: [id number] Patient Account Number: [id number] Date of Birth/Sex: [birth year] (83 y.o. Female) Treating RN: Montey Hora Primary Care Xavien Dauphinais: Clayborn Bigness Other Clinician: Referring Ciella Obi: Clayborn Bigness Treating Farhiya Rosten/Extender: Frann Rider in Treatment: 80 Active Problems Location of Pain Severity and Description of Pain Patient Has Paino Yes Site Locations Pain Location: Generalized Pain With Dressing Change: Yes Duration of the Pain. Constant / Intermittento Constant Pain Management and Medication Current Pain Management: Notes Topical or injectable lidocaine is offered to patient for acute pain when surgical debridement is performed. If needed, Patient is instructed to use over the counter pain medication for the following 24-48 hours after debridement. Wound care MDs  do not prescribed pain medications. Patient has chronic pain or uncontrolled pain. Patient has been instructed to make an appointment with their Primary Care Physician for pain management. Electronic Signature(s) Signed: 11/09/2016 5:57:13 PM By: Montey Hora Entered By: Montey Hora on 11/09/2016 14:34:12 [name] ([id number]) -------------------------------------------------------------------------------- Patient/Caregiver Education Details Patient Name: [name] Date of Service: 11/09/2016 2:30 PM Medical Record Number: [id number] Patient Account Number: [id number] Date of Birth/Gender: [birth year] (83 y.o. Female) Treating RN: Montey Hora Primary Care Physician: Clayborn Bigness Other Clinician: Referring Physician: Clayborn Bigness Treating Physician/Extender: Frann Rider in Treatment: 17 Education Assessment Education Provided To: Patient and Caregiver Education Topics Provided Basic Hygiene: Handouts: Other: care of newly healed ulcer site Methods: Explain/Verbal Responses: State content correctly Electronic Signature(s) Signed: 11/09/2016 5:57:13 PM By: Montey Hora Entered By: Montey Hora on 11/09/2016 16:30:45 Arington, [name] ([id number]) -------------------------------------------------------------------------------- Wound Assessment Details Patient Name: [name] Date of Service: 11/09/2016 2:30 PM Medical Record Number: [id number] Patient Account Number: [id number] Date of Birth/Sex: 07-06-1928 (83 y.o. Female) Treating RN: Montey Hora Primary Care Ermel Verne: Clayborn Bigness Other Clinician: Referring Junie Engram: Clayborn Bigness Treating Devarion Mcclanahan/Extender: Frann Rider in Treatment: 27 Wound Status Wound Number: 4 Primary Etiology: Trauma, Other Wound Location: Right, Proximal, Anterior Lower Wound Status:  Healed - Epithelialized Leg Comorbid History: Asthma, Hypertension Wounding Event: Trauma Date Acquired:  04/21/2016 Weeks Of Treatment: 25 Clustered Wound: No Photos Photo Uploaded By: Montey Hora on 11/09/2016 16:35:55 Wound Measurements Length: (cm) 0 Width: (cm) 0 Depth: (cm) 0 Area: (cm) 0 Volume: (cm) 0 % Reduction in Area: 100% % Reduction in Volume: 100% Epithelialization: None Tunneling: No Undermining: No Wound Description Full Thickness Without Exposed Classification: Support Structures Wound Margin: Flat and Intact Exudate Large Amount: Exudate Type: Sanguinous Exudate Color: red Foul Odor After Cleansing: Yes Due to Product Use: No Slough/Fibrino No Wound Bed Granulation Amount: Large (67-100%) Exposed Structure Granulation Quality: Red Fascia Exposed: No Claud, Kaylaann ([id number]) Necrotic Amount: Small (1-33%) Fat Layer (Subcutaneous Tissue) Exposed: No Necrotic Quality: Adherent Slough Tendon Exposed: No Muscle Exposed: No Joint Exposed: No Bone Exposed: No Limited to Skin Breakdown Periwound Skin Texture Texture Color No Abnormalities Noted: No No Abnormalities Noted: No Callus: No Atrophie Blanche: No Crepitus: No Cyanosis: No Excoriation: No Ecchymosis: No Induration: No Erythema: No Rash: No Hemosiderin Staining: No Scarring: No Mottled: No Pallor: No Moisture Rubor: No No Abnormalities Noted: No Dry / Scaly: No Temperature / Pain Maceration: No Temperature: No Abnormality Tenderness on Palpation: Yes Wound Preparation Ulcer Cleansing: Rinsed/Irrigated with Saline Topical Anesthetic Applied: Other: lidocaine 4%, Electronic Signature(s) Signed: 11/09/2016 5:57:13 PM By: Montey Hora Entered By: Montey Hora on 11/09/2016 15:06:59 [name] ([id number]) -------------------------------------------------------------------------------- Vitals Details Patient Name: [name] Date of Service: 11/09/2016 2:30 PM Medical Record Number: [id number] Patient Account Number: [id number] Date of Birth/Sex:  05/05/1929 (83 y.o. Female) Treating RN: Montey Hora Primary Care Lulamae Skorupski: Clayborn Bigness Other Clinician: Referring Malacki Mcphearson: Clayborn Bigness Treating Mart Colpitts/Extender: Frann Rider in Treatment: 91 Vital Signs Time Taken: 14:34 Temperature (F): 98.4 Height (in): 60 Pulse (bpm): 72 Weight (lbs): 122 Respiratory Rate (breaths/min): 18 Body Mass Index (BMI): 23.8 Blood Pressure (mmHg): 192/66 Reference Range: 80 - 120 mg / dl Electronic Signature(s) Signed: 11/09/2016 5:57:13 PM By: Montey Hora Entered By: Montey Hora on 11/09/2016 14:38:48

## 2016-11-17 ENCOUNTER — Ambulatory Visit: Payer: Medicare Other | Admitting: Physician Assistant

## 2017-05-15 ENCOUNTER — Ambulatory Visit: Admit: 2017-05-15 | Payer: No Typology Code available for payment source | Admitting: Ophthalmology

## 2017-05-15 SURGERY — REPAIR, LACERATION, EYELID
Anesthesia: Monitor Anesthesia Care | Laterality: Right

## 2017-07-05 ENCOUNTER — Ambulatory Visit: Payer: Medicare Other | Admitting: Nurse Practitioner

## 2017-07-05 VITALS — BP 172/90 | HR 75 | Resp 16 | Ht 60.0 in | Wt 113.0 lb

## 2017-07-05 DIAGNOSIS — R339 Retention of urine, unspecified: Secondary | ICD-10-CM

## 2017-07-05 DIAGNOSIS — L03119 Cellulitis of unspecified part of limb: Secondary | ICD-10-CM

## 2017-07-05 DIAGNOSIS — M5440 Lumbago with sciatica, unspecified side: Secondary | ICD-10-CM

## 2017-07-05 DIAGNOSIS — F411 Generalized anxiety disorder: Secondary | ICD-10-CM

## 2017-07-05 MED ORDER — DOXYCYCLINE HYCLATE 100 MG PO TABS: 100.0000 mg | ORAL_TABLET | Freq: Two times a day (BID) | ORAL | 0 refills | Status: DC

## 2017-07-05 MED ORDER — CLONAZEPAM 1 MG PO TABS: 1.0000 mg | ORAL_TABLET | Freq: Every evening | ORAL | 2 refills | Status: DC | PRN

## 2017-07-05 MED ORDER — HYDROCODONE-ACETAMINOPHEN 5-325 MG PO TABS: 1.0000 | ORAL_TABLET | Freq: Four times a day (QID) | ORAL | 0 refills | Status: DC | PRN

## 2017-07-05 NOTE — Progress Notes (Signed)
[facility] [place], [place] [address]  Internal MEDICINE  Office Visit Note  Patient Name: [name]  [id number]  [id number]  Date of Service: 07/15/2017  Chief Complaint  Patient presents with  . Rash    bilateral lowr legs   . Back Pain    Rash  This is a chronic problem. The current episode started more than 1 year ago. The problem is unchanged. The affected locations include the left lower leg and right lower leg. The rash is characterized by blistering, dryness, pain, redness and swelling. She was exposed to nothing. Associated symptoms include fatigue. Pertinent negatives include no congestion, diarrhea, rhinorrhea, shortness of breath or vomiting. Past treatments include antibiotics, analgesics, antibiotic cream, anti-itch cream and topical steroids. The treatment provided mild relief. (Basal cell cacinoma of the skin)  Back Pain  This is a recurrent problem. The current episode started more than 1 year ago. The problem occurs intermittently. The problem is unchanged. The pain is present in the sacro-iliac. The pain radiates to the right thigh. The pain is moderate. The pain is worse during the night. The symptoms are aggravated by lying down. Stiffness is present at night. Associated symptoms include bladder incontinence, dysuria, leg pain and weakness. Pertinent negatives include no chest pain. Risk factors include history of cancer. She has tried analgesics for the symptoms. The treatment provided mild relief.    Pt is here for routine follow up.    Current Medication: Outpatient Encounter Medications as of 07/05/2017  Medication Sig Note  . amLODipine (NORVASC) 5 MG tablet Take 5 mg by mouth.   . clobetasol cream (TEMOVATE) 0.05 % Apply twice a day to legs   . Ascorbic Acid (VITAMIN C) 1000 MG tablet Take 1,000 mg by mouth daily.   Marland Kitchen aspirin EC 81 MG tablet Take 81 mg by mouth daily.   . bisoprolol (ZEBETA) 10 MG tablet Take 20 mg by mouth  daily.   . calcium-vitamin D (OSCAL WITH D) 500-200 MG-UNIT tablet Take 1 tablet by mouth daily with breakfast.   . cholecalciferol (VITAMIN D) 1000 units tablet Take 1,000 Units by mouth daily.   . clonazePAM (KLONOPIN) 1 MG tablet Take 1 tablet (1 mg total) by mouth at bedtime as needed for anxiety.   . cloNIDine (CATAPRES) 0.1 MG tablet Take 1 tablet by mouth 2 (two) times daily.   Marland Kitchen doxycycline (VIBRA-TABS) 100 MG tablet Take 1 tablet (100 mg total) by mouth 2 (two) times daily.   Marland Kitchen estradiol (ESTRACE) 0.5 MG tablet Take 0.5 mg by mouth daily.   Marland Kitchen HYDROcodone-acetaminophen (NORCO/VICODIN) 5-325 MG tablet Take 1 tablet by mouth every 6 (six) hours as needed for moderate pain.   Marland Kitchen irbesartan (AVAPRO) 150 MG tablet Take 150 mg by mouth 2 (two) times daily.   Marland Kitchen labetalol (NORMODYNE) 100 MG tablet TAKE ONE TAB BY MOUTH TWICE A DAY FOR BLOOD PRESSURE   . montelukast (SINGULAIR) 10 MG tablet Take 1 tablet by mouth daily.   . Multiple Vitamin (MULTIVITAMIN) tablet Take 1 tablet by mouth daily.   Marland Kitchen PROAIR HFA 108 (90 Base) MCG/ACT inhaler Inhale 2 puffs into the lungs every 4 (four) hours as needed.   . vitamin B-12 (CYANOCOBALAMIN) 1000 MCG tablet Take 1,000 mcg by mouth daily.   . [DISCONTINUED] cephALEXin (KEFLEX) 500 MG capsule Take 1 capsule (500 mg total) by mouth 4 (four) times daily.   . [DISCONTINUED] cephALEXin (KEFLEX) 500 MG capsule Take 1 capsule (500 mg total)  by mouth 4 (four) times daily.   . [DISCONTINUED] clonazePAM (KLONOPIN) 1 MG tablet Take 1 mg by mouth at bedtime as needed for anxiety.   . [DISCONTINUED] nebivolol (BYSTOLIC) 10 MG tablet Take 1 tablet by mouth 1 day or 1 dose. 05/02/2016: Received from: [facility]: Take 10 mg by mouth once daily.   No facility-administered encounter medications on file as of 07/05/2017.     Surgical History: Past Surgical History:  Procedure Laterality Date  . ABDOMINAL HYSTERECTOMY      Medical  History: Past Medical History:  Diagnosis Date  . Asthma   . Heart murmur    Pt states she has Heart Murmur  . Hypertension     Family History: No family history on file.  Social History   Socioeconomic History  . Marital status: Widowed    Spouse name: Not on file  . Number of children: Not on file  . Years of education: Not on file  . Highest education level: Not on file  Social Needs  . Financial resource strain: Not on file  . Food insecurity - worry: Not on file  . Food insecurity - inability: Not on file  . Transportation needs - medical: Not on file  . Transportation needs - non-medical: Not on file  Occupational History  . Not on file  Tobacco Use  . Smoking status: Never Smoker  . Smokeless tobacco: Never Used  Substance and Sexual Activity  . Alcohol use: No  . Drug use: No  . Sexual activity: Not on file  Other Topics Concern  . Not on file  Social History Narrative  . Not on file      Review of Systems  Constitutional: Positive for activity change and fatigue.  HENT: Negative for congestion, postnasal drip and rhinorrhea.   Eyes: Negative.   Respiratory: Negative for shortness of breath and wheezing.   Cardiovascular: Negative for chest pain and palpitations.       Elevated blood pressure  Gastrointestinal: Positive for constipation. Negative for diarrhea, nausea and vomiting.  Endocrine: Negative for heat intolerance, polydipsia, polyphagia and polyuria.  Genitourinary: Positive for bladder incontinence, dysuria, enuresis and urgency.  Musculoskeletal: Positive for arthralgias, back pain and myalgias.  Skin: Positive for rash and wound.       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   Neurological: Positive for weakness.  Hematological: Negative for adenopathy. Does not bruise/bleed easily.  Psychiatric/Behavioral: The patient is not nervous/anxious.     Today's Vitals    07/05/17 1506 07/05/17 1624  BP: (!) 172/75 (!) 190/70  Pulse: 75   Resp: 16   SpO2: 98%   Weight: 113 lb (51.3 kg)   Height: 5' (1.524 m)     Physical Exam  Constitutional: She is oriented to person, place, and time. She appears well-developed and well-nourished. No distress.  HENT:  Head: Normocephalic and atraumatic.  Mouth/Throat: Oropharynx is clear and moist. No oropharyngeal exudate.  Eyes: EOM are normal. Pupils are equal, round, and reactive to light.  Neck: Normal range of motion. Neck supple. No JVD present. No tracheal deviation present. No thyromegaly present.  Cardiovascular: Normal rate. An irregular rhythm present. Exam reveals no gallop and no friction rub.  Murmur heard.  Systolic murmur is present with a grade of 2/6. 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 Tender and warm to palpation.   Pulmonary/Chest: Effort normal. No  respiratory distress. She has no wheezes. She has no rales. She exhibits no tenderness.  Abdominal: Soft. Bowel sounds are normal. There is no tenderness.  Musculoskeletal: Normal range of motion.  Lymphadenopathy:    She has no cervical adenopathy.  Neurological: She is alert and oriented to person, place, and time. No cranial nerve deficit.  Skin: Skin is warm and dry. She is not diaphoretic.  Evidence of chronic vasculitis inn both lower legs .currently, feet and lower legs wrspped in clean and dry dressing.   Psychiatric: She has a normal mood and affect. Her behavior is normal. Judgment and thought content normal.  Nursing note and vitals reviewed.   Assessment/Plan: 1. Recurrent cellulitis of lower leg - doxycycline (VIBRA-TABS) 100 MG tablet; Take 1 tablet (100 mg total) by mouth 2 (two) times daily.  Dispense: 20 tablet; Refill: 0 Continue regular visits with wound care and vascular surgery as scheduled for continued management.   2. Low back pain with sciatica, sciatica laterality unspecified, unspecified back pain laterality,  unspecified chronicity - HYDROcodone-acetaminophen (NORCO/VICODIN) 5-325 MG tablet; Take 1 tablet by mouth every 6 (six) hours as needed for moderate pain.  Dispense: 30 tablet; Refill: 0 Advissed her to use this medication only when needed and tylenol is not effective.   3. GAD (generalized anxiety disorder) - clonazePAM (KLONOPIN) 1 MG tablet; Take 1 tablet (1 mg total) by mouth at bedtime as needed for anxiety.  Dispense: 30 tablet; Refill: 2  4. Incomplete bladder emptying Reviewed bladder ultrasound with the patient. Revealed significant emtying delay of the bladder. - Ambulatory referral to Urology  General Counseling: Marene Lenz understanding of the findings of todays visit and agrees with plan of treatment. I have discussed any further diagnostic evaluation that may be needed or ordered today. We also reviewed her medications today. she has been encouraged to call the office with any questions or concerns that should arise related to todays visit.   Reviewed risks and possible side effects associated with taking opiates and benzodiazepines. Combination of these could cause dizziness and drowsiness. Advised him not to drive or operate machinery when taking these medications, as he could put his life and the lives of others at risk. He voiced understanding.     Orders Placed This Encounter  Procedures  . Ambulatory referral to Urology    Meds ordered this encounter  Medications  . doxycycline (VIBRA-TABS) 100 MG tablet    Sig: Take 1 tablet (100 mg total) by mouth 2 (two) times daily.    Dispense:  20 tablet    Refill:  0    Order Specific Question:   Supervising Provider    Answer:   [name] [[id number]]  . clonazePAM (KLONOPIN) 1 MG tablet    Sig: Take 1 tablet (1 mg total) by mouth at bedtime as needed for anxiety.    Dispense:  30 tablet    Refill:  2    Order Specific Question:   Supervising Provider    Answer:   [name] [[id number]]  .  HYDROcodone-acetaminophen (NORCO/VICODIN) 5-325 MG tablet    Sig: Take 1 tablet by mouth every 6 (six) hours as needed for moderate pain.    Dispense:  30 tablet    Refill:  0    Order Specific Question:   Supervising Provider    Answer:   [name] [[id number]]    Time spent:30 Minutes     Dr [name] Internal medicine

## 2017-07-15 ENCOUNTER — Encounter: Payer: Self-pay | Admitting: Nurse Practitioner

## 2017-07-25 ENCOUNTER — Other Ambulatory Visit: Payer: Self-pay | Admitting: Internal Medicine

## 2017-08-06 ENCOUNTER — Encounter: Payer: Self-pay | Admitting: *Deleted

## 2017-08-06 ENCOUNTER — Other Ambulatory Visit: Payer: Self-pay

## 2017-08-07 ENCOUNTER — Encounter: Payer: Self-pay | Admitting: Student in an Organized Health Care Education/Training Program

## 2017-08-13 NOTE — Discharge Instructions (Signed)
INSTRUCTIONS FOLLOWING OCULOPLASTIC SURGERY °AMY M. FOWLER, MD ° °AFTER YOUR EYE SURGERY, THER ARE MANY THINGS THWIHC YOU, THE PATIENT, CAN DO TO ASSURE THE BEST POSSIBLE RESULT FROM YOUR OPERATION.  THIS SHEET SHOULD BE REFERRED TO WHENEVER QUESTIONS ARISE.  IF THERE ARE ANY QUESTIONS NOT ANSWERED HERE, DO NOT HESITATE TO CALL OUR OFFICE AT [phone] OR [phone].  THERE IS ALWAYS [facility] AVAILABLE TO CALL IF QUESTIONS OR PROBLEMS ARISE. ° °VISION: Your vision may be blurred and out of focus after surgery until you are able to stop using your ointment, swelling resolves and your eye(s) heal. This may take 1 to 2 weeks at the least.  If your vision becomes gradually more dim or dark, this is not normal and you need to call our office immediately. ° °EYE CARE: For the first 48 hours after surgery, use ice packs frequently - “20 minutes on, 20 minutes off” - to help reduce swelling and bruising.  Small bags of frozen peas or corn make good ice packs along with cloths soaked in ice water.  If you are wearing a patch or other type of dressing following surgery, keep this on for the amount of time specified by your doctor.  For the first week following surgery, you will need to treat your stitches with great care.  If is OK to shower, but take care to not allow soapy water to run into your eye(s) to help reduce changes of infection.  You may gently clean the eyelashes and around the eye(s) with cotton balls and sterile water, BUT DO NOT RUB THE STITCHES VIGOROUSLY.  Keeping your stitches moist with ointment will help promote healing with minimal scar formation. ° °ACTIVITY: When you leave the surgery center, you should go home, rest and be inactive.  The eye(s) may feel scratchy and keeping the eyes closed will allow for faster healing.  The first week following surgery, avoid straining (anything making the face turn red) or lifting over 20 pounds.  Additionally, avoid bending which causes your head to go below  your waist.  Using your eyes will NOT harm them, so feel free to read, watch television, use the computer, etc as desired.  Driving depends on each individual, so check with your doctor if you have questions about driving. ° °MEDICATIONS:  You will be given a prescription for an ointment to use 4 times a day on your stitches.  You can use the ointment in your eyes if they feel scratchy or irritated.  If you eyelid(s) don’t close completely when you sleep, put some ointment in your eyes before bedtime. ° °EMERGENCY: If you experience SEVERE EYE PAIN OR HEADACHE UNRELIEVED BY TYLENOL OR PERCOCET, NAUSEA OR VOMITING, WORSENING REDNESS, OR WORSENING VISION (ESPECIALLY VISION THAT WA INITIALLY BETTER) CALL [phone] OR [phone] DURING BUSINESS HOURS OR AFTER HOURS. ° °General Anesthesia, Adult, Care After °These instructions provide you with information about caring for yourself after your procedure. Your health care provider may also give you more specific instructions. Your treatment has been planned according to current medical practices, but problems sometimes occur. Call your health care provider if you have any problems or questions after your procedure. °What can I expect after the procedure? °After the procedure, it is common to have: °· Vomiting. °· A sore throat. °· Mental slowness. ° °It is common to feel: °· Nauseous. °· Cold or shivery. °· Sleepy. °· Tired. °· Sore or achy, even in parts of your body where you did not have surgery. ° °  Follow these instructions at home: °For at least 24 hours after the procedure: °· Do not: °? Participate in activities where you could fall or become injured. °? Drive. °? Use heavy machinery. °? Drink alcohol. °? Take sleeping pills or medicines that cause drowsiness. °? Make important decisions or sign legal documents. °? Take care of children on your own. °· Rest. °Eating and drinking °· If you vomit, drink water, juice, or soup when you can drink without  vomiting. °· Drink enough fluid to keep your urine clear or pale yellow. °· Make sure you have little or no nausea before eating solid foods. °· Follow the diet recommended by your health care provider. °General instructions °· Have a responsible adult stay with you until you are awake and alert. °· Return to your normal activities as told by your health care provider. Ask your health care provider what activities are safe for you. °· Take over-the-counter and prescription medicines only as told by your health care provider. °· If you smoke, do not smoke without supervision. °· Keep all follow-up visits as told by your health care provider. This is important. °Contact a health care provider if: °· You continue to have nausea or vomiting at home, and medicines are not helpful. °· You cannot drink fluids or start eating again. °· You cannot urinate after 8-12 hours. °· You develop a skin rash. °· You have fever. °· You have increasing redness at the site of your procedure. °Get help right away if: °· You have difficulty breathing. °· You have chest pain. °· You have unexpected bleeding. °· You feel that you are having a life-threatening or urgent problem. °This information is not intended to replace advice given to you by your health care provider. Make sure you discuss any questions you have with your health care provider. °Document Released: 08/21/2000 Document Revised: 10/18/2015 Document Reviewed: 04/29/2015 °Elsevier Interactive Patient Education © 2018 [employer] ° °

## 2017-08-14 ENCOUNTER — Ambulatory Visit
Admission: RE | Admit: 2017-08-14 | Discharge: 2017-08-14 | Disposition: A | Discharge status: Home / Self Care | Payer: Medicare Other | Source: Ambulatory Visit | Attending: Ophthalmology | Admitting: Ophthalmology

## 2017-08-14 ENCOUNTER — Encounter
Admission: RE | Disposition: A | Discharge status: Home / Self Care | Payer: Self-pay | Source: Ambulatory Visit | Attending: Ophthalmology

## 2017-08-14 DIAGNOSIS — C441022 Unspecified malignant neoplasm of skin of right lower eyelid, including canthus: Secondary | ICD-10-CM | POA: Insufficient documentation

## 2017-08-14 DIAGNOSIS — Z5309 Procedure and treatment not carried out because of other contraindication: Secondary | ICD-10-CM | POA: Diagnosis not present

## 2017-08-14 HISTORY — DX: Unspecified malignant neoplasm of skin, unspecified: C44.90

## 2017-08-14 HISTORY — DX: Disorder of kidney and ureter, unspecified: N28.9

## 2017-08-14 HISTORY — DX: Unspecified injury of unspecified lower leg, initial encounter: S89.90XA

## 2017-08-14 SURGERY — REPAIR, LACERATION, EYELID
Anesthesia: Monitor Anesthesia Care | Laterality: Right

## 2017-08-14 MED ORDER — LABETALOL HCL 5 MG/ML IV SOLN
10.0000 mg | INTRAVENOUS | Status: AC | PRN
  Administered 2017-08-14 (×2): 10 mg via INTRAVENOUS

## 2017-08-14 MED ORDER — LACTATED RINGERS IV SOLN
INTRAVENOUS | Status: DC
  Administered 2017-08-14: 09:00:00 via INTRAVENOUS

## 2017-08-14 SURGICAL SUPPLY — 35 items
APPLICATOR COTTON TIP WD 3 STR (MISCELLANEOUS) ×3 IMPLANT
BLADE SURG 15 STRL LF DISP TIS (BLADE) ×1 IMPLANT
BLADE SURG 15 STRL SS (BLADE) ×2
CORD BIP STRL DISP 12FT (MISCELLANEOUS) ×3 IMPLANT
DRAPE HEAD BAR (DRAPES) ×3 IMPLANT
GAUZE SPONGE 4X4 12PLY STRL (GAUZE/BANDAGES/DRESSINGS) ×3 IMPLANT
GAUZE SPONGE NON-WVN 2X2 STRL (MISCELLANEOUS) ×10 IMPLANT
GLOVE SURG LX 7.0 MICRO (GLOVE) ×4
GLOVE SURG LX STRL 7.0 MICRO (GLOVE) ×2 IMPLANT
MARKER SKIN XFINE TIP W/RULER (MISCELLANEOUS) ×3 IMPLANT
NEEDLE FILTER BLUNT 18X 1/2SAF (NEEDLE) ×2
NEEDLE FILTER BLUNT 18X1 1/2 (NEEDLE) ×1 IMPLANT
NEEDLE HYPO 30X.5 LL (NEEDLE) ×6 IMPLANT
PACK DRAPE NASAL/ENT (PACKS) ×3 IMPLANT
SOL PREP PVP 2OZ (MISCELLANEOUS) ×3
SOLUTION PREP PVP 2OZ (MISCELLANEOUS) ×1 IMPLANT
SPONGE VERSALON 2X2 STRL (MISCELLANEOUS) ×20
SUT CHROMIC 4-0 (SUTURE)
SUT CHROMIC 4-0 M2 12X2 ARM (SUTURE)
SUT CHROMIC 5 0 P 3 (SUTURE) IMPLANT
SUT ETHILON 4 0 CL P 3 (SUTURE) IMPLANT
SUT MERSILENE 4-0 S-2 (SUTURE) IMPLANT
SUT PLAIN GUT (SUTURE) ×3 IMPLANT
SUT PROLENE 5 0 P 3 (SUTURE) IMPLANT
SUT PROLENE 6 0 P 1 18 (SUTURE) IMPLANT
SUT SILK 4 0 G 3 (SUTURE) IMPLANT
SUT VIC AB 5-0 P-3 18X BRD (SUTURE) IMPLANT
SUT VIC AB 5-0 P3 18 (SUTURE)
SUT VICRYL 6-0  S14 CTD (SUTURE)
SUT VICRYL 6-0 S14 CTD (SUTURE) IMPLANT
SUT VICRYL 7 0 TG140 8 (SUTURE) IMPLANT
SUTURE CHRMC 4-0 M2 12X2 ARM (SUTURE) IMPLANT
SYR 3ML LL SCALE MARK (SYRINGE) ×3 IMPLANT
SYRINGE 10CC LL (SYRINGE) ×3 IMPLANT
WATER STERILE IRR 250ML POUR (IV SOLUTION) ×3 IMPLANT

## 2017-08-14 NOTE — H&P (Signed)
See the history and physical completed at [facility] on 07/27/17 and scanned into the chart.

## 2017-08-14 NOTE — Interval H&P Note (Signed)
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 After consideration of risks, benefits and other options for treatment, the patient has consented to  Procedure(s): EXCISE AND REPAIR EYELID FULL THICKNESS UP TO 1/4 LID MARGIN (Right) as a surgical intervention .  The patient's history has been reviewed, patient examined, no change in status, stable for surgery.  I have reviewed the patient's chart and labs.  Questions were answered to the patient's satisfaction.     Vickki Muff, Minal Stuller M

## 2017-08-14 NOTE — Progress Notes (Signed)
Surgery cancelled due to elevated blood pressure. See anesthesia note.

## 2017-08-14 NOTE — Anesthesia Preprocedure Evaluation (Deleted)
Anesthesia Evaluation  Patient identified by MRN, date of birth, ID band Patient awake    Reviewed: Allergy & Precautions, NPO status , Patient's Chart, lab work & pertinent test results  History of Anesthesia Complications Negative for: history of anesthetic complications  Airway Mallampati: III  TM Distance: >3 FB Neck ROM: Full    Dental no notable dental hx. (+) Implants   Pulmonary neg pulmonary ROS,    Pulmonary exam normal breath sounds clear to auscultation       Cardiovascular Exercise Tolerance: Good hypertension (poorly controlled), Pt. on medications and Pt. on home beta blockers + Valvular Problems/Murmurs (murmur)  Rhythm:Regular Rate:Normal + Systolic murmurs    Neuro/Psych PSYCHIATRIC DISORDERS Anxiety negative neurological ROS     GI/Hepatic GERD  ,  Endo/Other  negative endocrine ROS  Renal/GU Renal InsufficiencyRenal disease     Musculoskeletal   Abdominal   Peds  Hematology Skin CA   Anesthesia Other Findings   Reproductive/Obstetrics                             Anesthesia Physical Anesthesia Plan  ASA: III  Anesthesia Plan: General   Post-op Pain Management:    Induction: Intravenous  PONV Risk Score and Plan: 2 and Propofol infusion and TIVA  Airway Management Planned: Natural Airway  Additional Equipment:   Intra-op Plan:   Post-operative Plan:   Informed Consent: I have reviewed the patients History and Physical, chart, labs and discussed the procedure including the risks, benefits and alternatives for the proposed anesthesia with the patient or authorized representative who has indicated his/her understanding and acceptance.     Plan Discussed with: CRNA  Anesthesia Plan Comments: (Pt with SBP>220 upon arrival in preop.  Pt did not take home labetalol dose today, so gave labetalol 21ml IV x2, but SBP never lower than 190.  Discussed with Dr.  Vickki Muff and agreed to cancel procedure.  Pt instructed to take all BP medications as instructed (has been skipping and decreasing doses at home), and will return in a few weeks to see if improved.)       Anesthesia Quick Evaluation

## 2017-08-15 ENCOUNTER — Telehealth: Payer: Self-pay

## 2017-08-15 NOTE — Telephone Encounter (Signed)
error 

## 2017-08-16 NOTE — Telephone Encounter (Signed)
She needs to schedule an appointment, maybe with dr. Humphrey Rolls. We have tried multiple meds for BP issues, however, she stops due to negative side effects.

## 2017-08-17 ENCOUNTER — Telehealth: Payer: Self-pay

## 2017-08-17 NOTE — Telephone Encounter (Signed)
Discussed pt elevated blood pressure with [name] an appt was made with Dr. Humphrey Rolls.

## 2017-08-17 NOTE — Telephone Encounter (Signed)
Error

## 2017-08-22 ENCOUNTER — Encounter: Payer: Self-pay | Admitting: Internal Medicine

## 2017-08-22 ENCOUNTER — Ambulatory Visit: Payer: Medicare Other | Admitting: Internal Medicine

## 2017-08-22 VITALS — BP 180/90 | HR 83 | Resp 16 | Ht 60.0 in | Wt 113.2 lb

## 2017-08-22 DIAGNOSIS — F411 Generalized anxiety disorder: Secondary | ICD-10-CM

## 2017-08-22 DIAGNOSIS — R339 Retention of urine, unspecified: Secondary | ICD-10-CM | POA: Diagnosis not present

## 2017-08-22 DIAGNOSIS — I161 Hypertensive emergency: Secondary | ICD-10-CM | POA: Diagnosis not present

## 2017-08-22 MED ORDER — LABETALOL HCL 100 MG PO TABS: 100.0000 mg | ORAL_TABLET | Freq: Two times a day (BID) | ORAL | 2 refills | Status: DC

## 2017-08-22 MED ORDER — PHENAZOPYRIDINE HCL 100 MG PO TABS: ORAL_TABLET | ORAL | 6 refills | Status: DC

## 2017-08-22 NOTE — Progress Notes (Signed)
[facility] [place], [place] [address]  Internal MEDICINE  Office Visit Note  Patient Name: [name]  [id number]  [id number]  Date of Service: 09/04/2017  Chief Complaint  Patient presents with  . Hypertension    pt went for biopsy and blood pressure was high 190/76   . Urinary Tract Infection    Hypertension  This is a chronic (Pt has renovascular htn) problem. The current episode started more than 1 year ago. The problem has been gradually worsening since onset. The problem is resistant. Associated symptoms include anxiety and headaches. Pertinent negatives include no chest pain, neck pain, palpitations or shortness of breath. Risk factors for coronary artery disease include stress and post-menopausal state. Past treatments include beta blockers, direct vasodilators, diuretics and alpha 1 blockers. The current treatment provides no improvement. Compliance problems include medication side effects.  There is no history of chronic renal disease.  Other  This is a recurrent (Pt is having bladder spasms, she is scheduled to have UV Prolapse surgery. ) problem. The current episode started 1 to 4 weeks ago. Associated symptoms include headaches. Pertinent negatives include no abdominal pain, arthralgias, chest pain, chills, congestion, coughing, fatigue, joint swelling, nausea, neck pain, numbness, rash, sore throat or vomiting.   Pt is here for routine follow up. She does not take all meds as prescribed. She has renovascular htn. Pt need UV Prolase surgery    Current Medication: Outpatient Encounter Medications as of 08/22/2017  Medication Sig  . amLODipine (NORVASC) 5 MG tablet TAKE 1 TABLET BY MOUTH AT BEDTIME FOR HYPERTENSION  . Ascorbic Acid (VITAMIN C) 1000 MG tablet Take 1,000 mg by mouth daily.  Marland Kitchen aspirin EC 81 MG tablet Take 81 mg by mouth daily.  . Biotin 5000 MCG TABS Take by mouth daily.  . calcium-vitamin D (OSCAL WITH D) 500-200 MG-UNIT tablet  Take 1 tablet by mouth daily with breakfast.  . cholecalciferol (VITAMIN D) 1000 units tablet Take 1,000 Units by mouth daily.  . clobetasol cream (TEMOVATE) 0.05 % Apply twice a day to legs  . clonazePAM (KLONOPIN) 1 MG tablet Take 1 tablet (1 mg total) by mouth at bedtime as needed for anxiety.  . cloNIDine (CATAPRES) 0.1 MG tablet Take 1 tablet by mouth 2 (two) times daily.  Marland Kitchen doxycycline (VIBRA-TABS) 100 MG tablet Take 1 tablet (100 mg total) by mouth 2 (two) times daily.  Marland Kitchen estradiol (ESTRACE) 0.5 MG tablet Take 0.5 mg by mouth daily.  Marland Kitchen HYDROcodone-acetaminophen (NORCO/VICODIN) 5-325 MG tablet Take 1 tablet by mouth every 6 (six) hours as needed for moderate pain. (Patient not taking: Reported on 08/06/2017)  . irbesartan (AVAPRO) 150 MG tablet Take 150 mg by mouth 2 (two) times daily.  Marland Kitchen labetalol (NORMODYNE) 100 MG tablet Take 1 tablet (100 mg total) by mouth 2 (two) times daily.  . montelukast (SINGULAIR) 10 MG tablet Take 1 tablet by mouth daily.  . Multiple Vitamin (MULTIVITAMIN) tablet Take 1 tablet by mouth daily.  . phenazopyridine (PYRIDIUM) 100 MG tablet Take one tab po bid for baldder prn  . PROAIR HFA 108 (90 Base) MCG/ACT inhaler Inhale 2 puffs into the lungs every 4 (four) hours as needed.  . vitamin B-12 (CYANOCOBALAMIN) 1000 MCG tablet Take 1,000 mcg by mouth daily.  Marland Kitchen VITAMIN E PO Take by mouth daily.  . [DISCONTINUED] bisoprolol (ZEBETA) 10 MG tablet Take 20 mg by mouth daily.  . [DISCONTINUED] labetalol (NORMODYNE) 100 MG tablet TAKE ONE TAB BY MOUTH TWICE  A DAY FOR BLOOD PRESSURE   No facility-administered encounter medications on file as of 08/22/2017.     Surgical History: Past Surgical History:  Procedure Laterality Date  . ABDOMINAL HYSTERECTOMY    . COLONOSCOPY    . RENAL ARTERY STENT    . TONSILLECTOMY      Medical History: Past Medical History:  Diagnosis Date  . Asthma    in past  . Heart murmur    Pt states she has Heart Murmur  . Hypertension    . Lower leg injury 04/24/2016   08/06/17- pt reports legs still not completely healed  . Renal insufficiency    10%-Right, 90%-Left (per pt)  . Skin cancer     Family History: Family History  Problem Relation Age of Onset  . Hypertension Mother     Social History   Socioeconomic History  . Marital status: Widowed    Spouse name: Not on file  . Number of children: Not on file  . Years of education: Not on file  . Highest education level: Not on file  Occupational History  . Not on file  Social Needs  . Financial resource strain: Not on file  . Food insecurity:    Worry: Not on file    Inability: Not on file  . Transportation needs:    Medical: Not on file    Non-medical: Not on file  Tobacco Use  . Smoking status: Never Smoker  . Smokeless tobacco: Never Used  Substance and Sexual Activity  . Alcohol use: No  . Drug use: No  . Sexual activity: Not on file  Lifestyle  . Physical activity:    Days per week: Not on file    Minutes per session: Not on file  . Stress: Not on file  Relationships  . Social connections:    Talks on phone: Not on file    Gets together: Not on file    Attends religious service: Not on file    Active member of club or organization: Not on file    Attends meetings of clubs or organizations: Not on file    Relationship status: Not on file  . Intimate partner violence:    Fear of current or ex partner: Not on file    Emotionally abused: Not on file    Physically abused: Not on file    Forced sexual activity: Not on file  Other Topics Concern  . Not on file  Social History Narrative  . Not on file   Review of Systems  Constitutional: Negative for chills, fatigue and unexpected weight change.  HENT: Positive for postnasal drip. Negative for congestion, rhinorrhea, sneezing and sore throat.   Eyes: Negative for redness.  Respiratory: Negative for cough, chest tightness and shortness of breath.   Cardiovascular: Negative for chest pain  and palpitations.       Elevated bp  Gastrointestinal: Negative for abdominal pain, constipation, diarrhea, nausea and vomiting.  Genitourinary: Negative for dysuria and frequency.  Musculoskeletal: Negative for arthralgias, back pain, joint swelling and neck pain.  Skin: Negative for rash.  Neurological: Positive for headaches. Negative for tremors and numbness.  Hematological: Negative for adenopathy. Does not bruise/bleed easily.  Psychiatric/Behavioral: Negative for behavioral problems (Depression), sleep disturbance and suicidal ideas. The patient is not nervous/anxious.     Vital Signs: BP (!) 180/90   Pulse 83   Resp 16   Ht 5' (1.524 m)   Wt 113 lb 3.2 oz (51.3 kg)  SpO2 95%   BMI 22.11 kg/m  Repeated bp 170/80  Physical Exam  Constitutional: She is oriented to person, place, and time. She appears well-developed and well-nourished. No distress.  HENT:  Head: Normocephalic and atraumatic.  Mouth/Throat: Oropharynx is clear and moist. No oropharyngeal exudate.  Eyes: Pupils are equal, round, and reactive to light. EOM are normal.  Neck: Normal range of motion. Neck supple. No JVD present. No tracheal deviation present. No thyromegaly present.  Cardiovascular: Normal rate, regular rhythm and normal heart sounds. Exam reveals no gallop and no friction rub.  No murmur heard. Pulmonary/Chest: Effort normal. No respiratory distress. She has no wheezes. She has no rales. She exhibits no tenderness.  Abdominal: Soft. Bowel sounds are normal.  Musculoskeletal: Normal range of motion.  Lymphadenopathy:    She has no cervical adenopathy.  Neurological: She is alert and oriented to person, place, and time. No cranial nerve deficit.  Skin: Skin is warm and dry. She is not diaphoretic.  Psychiatric: She has a normal mood and affect. Her behavior is normal. Judgment and thought content normal.   Assessment/Plan: 1. Hypertensive emergency Continue Norvasc, Irbesartan, Labetolol,  Clonidine prn use  - labetalol (NORMODYNE) 100 MG tablet; Take 1 tablet (100 mg total) by mouth 2 (two) times daily.  Dispense: 180 tablet; Refill: 2  2. Incomplete bladder emptying Add - phenazopyridine (PYRIDIUM) 100 MG tablet; Take one tab po bid for baldder prn  Dispense: 45 tablet; Refill: 6  3. Generalized anxiety disorder Continue As before   General Counseling: Anielle verbalizes understanding of the findings of todays visit and agrees with plan of treatment. I have discussed any further diagnostic evaluation that may be needed or ordered today. We also reviewed her medications today. she has been encouraged to call the office with any questions or concerns that should arise related to todays visit.  Meds ordered this encounter  Medications  . labetalol (NORMODYNE) 100 MG tablet    Sig: Take 1 tablet (100 mg total) by mouth 2 (two) times daily.    Dispense:  180 tablet    Refill:  2  . phenazopyridine (PYRIDIUM) 100 MG tablet    Sig: Take one tab po bid for baldder prn    Dispense:  45 tablet    Refill:  6    Time spent:25 Minutes   Dr [name] Internal medicine

## 2017-09-04 ENCOUNTER — Other Ambulatory Visit: Payer: Self-pay

## 2017-09-04 ENCOUNTER — Encounter: Payer: Self-pay | Admitting: Anesthesiology

## 2017-09-04 ENCOUNTER — Encounter: Payer: Self-pay | Admitting: *Deleted

## 2017-09-05 NOTE — Discharge Instructions (Signed)
INSTRUCTIONS FOLLOWING OCULOPLASTIC SURGERY °AMY M. FOWLER, MD ° °AFTER YOUR EYE SURGERY, THER ARE MANY THINGS THWIHC YOU, THE PATIENT, CAN DO TO ASSURE THE BEST POSSIBLE RESULT FROM YOUR OPERATION.  THIS SHEET SHOULD BE REFERRED TO WHENEVER QUESTIONS ARISE.  IF THERE ARE ANY QUESTIONS NOT ANSWERED HERE, DO NOT HESITATE TO CALL OUR OFFICE AT [phone] OR [phone].  THERE IS ALWAYS [facility] AVAILABLE TO CALL IF QUESTIONS OR PROBLEMS ARISE. ° °VISION: Your vision may be blurred and out of focus after surgery until you are able to stop using your ointment, swelling resolves and your eye(s) heal. This may take 1 to 2 weeks at the least.  If your vision becomes gradually more dim or dark, this is not normal and you need to call our office immediately. ° °EYE CARE: For the first 48 hours after surgery, use ice packs frequently - “20 minutes on, 20 minutes off” - to help reduce swelling and bruising.  Small bags of frozen peas or corn make good ice packs along with cloths soaked in ice water.  If you are wearing a patch or other type of dressing following surgery, keep this on for the amount of time specified by your doctor.  For the first week following surgery, you will need to treat your stitches with great care.  If is OK to shower, but take care to not allow soapy water to run into your eye(s) to help reduce changes of infection.  You may gently clean the eyelashes and around the eye(s) with cotton balls and sterile water, BUT DO NOT RUB THE STITCHES VIGOROUSLY.  Keeping your stitches moist with ointment will help promote healing with minimal scar formation. ° °ACTIVITY: When you leave the surgery center, you should go home, rest and be inactive.  The eye(s) may feel scratchy and keeping the eyes closed will allow for faster healing.  The first week following surgery, avoid straining (anything making the face turn red) or lifting over 20 pounds.  Additionally, avoid bending which causes your head to go below  your waist.  Using your eyes will NOT harm them, so feel free to read, watch television, use the computer, etc as desired.  Driving depends on each individual, so check with your doctor if you have questions about driving. ° °MEDICATIONS:  You will be given a prescription for an ointment to use 4 times a day on your stitches.  You can use the ointment in your eyes if they feel scratchy or irritated.  If you eyelid(s) don’t close completely when you sleep, put some ointment in your eyes before bedtime. ° °EMERGENCY: If you experience SEVERE EYE PAIN OR HEADACHE UNRELIEVED BY TYLENOL OR PERCOCET, NAUSEA OR VOMITING, WORSENING REDNESS, OR WORSENING VISION (ESPECIALLY VISION THAT WA INITIALLY BETTER) CALL [phone] OR [phone] DURING BUSINESS HOURS OR AFTER HOURS. ° °General Anesthesia, Adult, Care After °These instructions provide you with information about caring for yourself after your procedure. Your health care provider may also give you more specific instructions. Your treatment has been planned according to current medical practices, but problems sometimes occur. Call your health care provider if you have any problems or questions after your procedure. °What can I expect after the procedure? °After the procedure, it is common to have: °· Vomiting. °· A sore throat. °· Mental slowness. ° °It is common to feel: °· Nauseous. °· Cold or shivery. °· Sleepy. °· Tired. °· Sore or achy, even in parts of your body where you did not have surgery. ° °  Follow these instructions at home: °For at least 24 hours after the procedure: °· Do not: °? Participate in activities where you could fall or become injured. °? Drive. °? Use heavy machinery. °? Drink alcohol. °? Take sleeping pills or medicines that cause drowsiness. °? Make important decisions or sign legal documents. °? Take care of children on your own. °· Rest. °Eating and drinking °· If you vomit, drink water, juice, or soup when you can drink without  vomiting. °· Drink enough fluid to keep your urine clear or pale yellow. °· Make sure you have little or no nausea before eating solid foods. °· Follow the diet recommended by your health care provider. °General instructions °· Have a responsible adult stay with you until you are awake and alert. °· Return to your normal activities as told by your health care provider. Ask your health care provider what activities are safe for you. °· Take over-the-counter and prescription medicines only as told by your health care provider. °· If you smoke, do not smoke without supervision. °· Keep all follow-up visits as told by your health care provider. This is important. °Contact a health care provider if: °· You continue to have nausea or vomiting at home, and medicines are not helpful. °· You cannot drink fluids or start eating again. °· You cannot urinate after 8-12 hours. °· You develop a skin rash. °· You have fever. °· You have increasing redness at the site of your procedure. °Get help right away if: °· You have difficulty breathing. °· You have chest pain. °· You have unexpected bleeding. °· You feel that you are having a life-threatening or urgent problem. °This information is not intended to replace advice given to you by your health care provider. Make sure you discuss any questions you have with your health care provider. °Document Released: 08/21/2000 Document Revised: 10/18/2015 Document Reviewed: 04/29/2015 °Elsevier Interactive Patient Education © 2018 [employer] ° °

## 2017-09-11 ENCOUNTER — Encounter
Admission: RE | Disposition: A | Discharge status: Home / Self Care | Payer: Self-pay | Source: Ambulatory Visit | Attending: Ophthalmology

## 2017-09-11 ENCOUNTER — Ambulatory Visit
Admission: RE | Admit: 2017-09-11 | Discharge: 2017-09-11 | Disposition: A | Discharge status: Home / Self Care | Payer: Medicare Other | Source: Ambulatory Visit | Attending: Ophthalmology | Admitting: Ophthalmology

## 2017-09-11 DIAGNOSIS — Z5309 Procedure and treatment not carried out because of other contraindication: Secondary | ICD-10-CM | POA: Insufficient documentation

## 2017-09-11 DIAGNOSIS — C441022 Unspecified malignant neoplasm of skin of right lower eyelid, including canthus: Secondary | ICD-10-CM | POA: Diagnosis present

## 2017-09-11 SURGERY — EXCISION, LESION, EYELID
Anesthesia: Monitor Anesthesia Care | Laterality: Right

## 2017-09-11 SURGICAL SUPPLY — 22 items
APPLICATOR COTTON TIP WD 3 STR (MISCELLANEOUS) ×2 IMPLANT
BANDAGE EYE OVAL (MISCELLANEOUS) ×2 IMPLANT
BLADE SURG SZ11 CARB STEEL (BLADE) ×2 IMPLANT
CAUTERY HITEMP AA01 (MISCELLANEOUS) ×2 IMPLANT
CORD BIP STRL DISP 12FT (MISCELLANEOUS) ×2 IMPLANT
COVER MAYO STAND STRL (DRAPES) ×2 IMPLANT
DRAPE SHEET LG 3/4 BI-LAMINATE (DRAPES) ×2 IMPLANT
GAUZE SPONGE 4X4 12PLY STRL (GAUZE/BANDAGES/DRESSINGS) ×2 IMPLANT
GAUZE SPONGE NON-WVN 2X2 STRL (MISCELLANEOUS) ×1 IMPLANT
GLOVE SURG LX 7.0 MICRO (GLOVE) ×2
GLOVE SURG LX STRL 7.0 MICRO (GLOVE) ×2 IMPLANT
MARKER SKIN XFINE TIP W/RULER (MISCELLANEOUS) ×2 IMPLANT
NEEDLE FILTER BLUNT 18X 1/2SAF (NEEDLE) ×1
NEEDLE FILTER BLUNT 18X1 1/2 (NEEDLE) ×1 IMPLANT
NEEDLE HYPO 30X.5 LL (NEEDLE) ×2 IMPLANT
SOL PREP PVP 2OZ (MISCELLANEOUS) ×2
SOLUTION PREP PVP 2OZ (MISCELLANEOUS) ×1 IMPLANT
SPONGE VERSALON 2X2 STRL (MISCELLANEOUS) ×1
SUT PLAIN GUT (SUTURE) ×2 IMPLANT
SYRINGE 10CC LL (SYRINGE) ×2 IMPLANT
TOWEL OR 17X26 4PK STRL BLUE (TOWEL DISPOSABLE) ×2 IMPLANT
WATER STERILE IRR 250ML POUR (IV SOLUTION) ×2 IMPLANT

## 2017-09-11 NOTE — Anesthesia Preprocedure Evaluation (Addendum)
Anesthesia Evaluation  Patient identified by MRN, date of birth, ID band Patient awake    Reviewed: Allergy & Precautions, NPO status , Patient's Chart, lab work & pertinent test results  History of Anesthesia Complications Negative for: history of anesthetic complications  Airway Mallampati: III  TM Distance: >3 FB Neck ROM: Full    Dental no notable dental hx. (+) Implants   Pulmonary neg pulmonary ROS,    Pulmonary exam normal breath sounds clear to auscultation       Cardiovascular Exercise Tolerance: Good hypertension (poorly controlled), Pt. on medications and Pt. on home beta blockers (-) angina+ Valvular Problems/Murmurs (murmur)  Rhythm:Regular Rate:Normal + Systolic murmurs    Neuro/Psych PSYCHIATRIC DISORDERS Anxiety negative neurological ROS     GI/Hepatic GERD  ,  Endo/Other  negative endocrine ROS  Renal/GU Renal InsufficiencyRenal disease     Musculoskeletal   Abdominal   Peds  Hematology Skin CA   Anesthesia Other Findings   Reproductive/Obstetrics                            Anesthesia Physical  Anesthesia Plan  ASA: III  Anesthesia Plan:    Post-op Pain Management:    Induction:   PONV Risk Score and Plan:   Airway Management Planned:   Additional Equipment:   Intra-op Plan:   Post-operative Plan:   Informed Consent:   Plan Discussed with: CRNA  Anesthesia Plan Comments: (Pt with SBP>200 on two readings - recently cancelled for uncontrolled HTN. Patient did not take all BP medications today. Last saw internist 3/28. Discussed with Dr. Vickki Muff - agreed to cancel. Patient asymptomatic - to go home and take meds that were not taken this morning. Will follow up with PCP and Dr. Vickki Muff. Veda Canning, MD)        Anesthesia Quick Evaluation

## 2017-09-11 NOTE — Progress Notes (Signed)
BP 202/76 on right arm, repeat BP 206/75 on left arm. Surgery cancelled per anesthesia. Anesthesiologist encouraged patient to follow-up with primary care for blood pressure management.

## 2017-09-28 ENCOUNTER — Other Ambulatory Visit: Payer: Self-pay | Admitting: Internal Medicine

## 2017-10-04 ENCOUNTER — Encounter: Payer: Self-pay | Admitting: Nurse Practitioner

## 2017-10-04 ENCOUNTER — Ambulatory Visit: Payer: Medicare Other | Admitting: Nurse Practitioner

## 2017-10-04 VITALS — BP 180/70 | HR 80 | Resp 18 | Ht 60.0 in | Wt 113.0 lb

## 2017-10-04 DIAGNOSIS — I739 Peripheral vascular disease, unspecified: Secondary | ICD-10-CM | POA: Diagnosis not present

## 2017-10-04 DIAGNOSIS — F411 Generalized anxiety disorder: Secondary | ICD-10-CM | POA: Diagnosis not present

## 2017-10-04 DIAGNOSIS — I10 Essential (primary) hypertension: Secondary | ICD-10-CM | POA: Diagnosis not present

## 2017-10-04 DIAGNOSIS — L089 Local infection of the skin and subcutaneous tissue, unspecified: Secondary | ICD-10-CM

## 2017-10-04 MED ORDER — LABETALOL HCL 100 MG PO TABS: 200.0000 mg | ORAL_TABLET | Freq: Two times a day (BID) | ORAL | 2 refills | Status: DC

## 2017-10-04 NOTE — Progress Notes (Signed)
[facility] [place],  [id number]  Internal MEDICINE  Office Visit Note  Patient Name: [name]  [id number]  [id number]  Date of Service: 10/28/2017   Pt is here for routine follow up.   Chief Complaint  Patient presents with  . Hypertension  . Dizziness    Hypertension  This is a chronic problem. The current episode started more than 1 year ago. The problem is unchanged. The problem is resistant. Associated symptoms include headaches, malaise/fatigue and orthopnea. Pertinent negatives include no chest pain, neck pain, palpitations or shortness of breath. There are no associated agents to hypertension. Risk factors for coronary artery disease include dyslipidemia, stress and post-menopausal state. Past treatments include beta blockers, lifestyle changes, direct vasodilators and angiotensin blockers. The current treatment provides moderate improvement. Compliance problems include medication side effects.  Hypertensive end-organ damage includes PVD. Identifiable causes of hypertension include chronic renal disease.  Dizziness  This is a recurrent problem. The current episode started more than 1 year ago. The problem occurs intermittently. The problem has been unchanged. Associated symptoms include fatigue and headaches. Pertinent negatives include no abdominal pain, arthralgias, chest pain, chills, congestion, coughing, joint swelling, nausea, neck pain, numbness, rash, sore throat or vomiting. The symptoms are aggravated by exertion (taking clonidine.). Treatments tried: not taking theclonidine helps. The treatment provided moderate relief.       Current Medication: Outpatient Encounter Medications as of 10/04/2017  Medication Sig  . amLODipine (NORVASC) 5 MG tablet TAKE 1 TABLET BY MOUTH AT BEDTIME FOR HYPERTENSION  . Ascorbic Acid (VITAMIN C) 1000 MG tablet Take 1,000 mg by mouth daily.  Marland Kitchen aspirin EC 81 MG tablet Take 81 mg by mouth daily.  .  Biotin 5000 MCG TABS Take by mouth daily.  . calcium-vitamin D (OSCAL WITH D) 500-200 MG-UNIT tablet Take 1 tablet by mouth daily with breakfast.  . cholecalciferol (VITAMIN D) 1000 units tablet Take 1,000 Units by mouth daily.  . clobetasol cream (TEMOVATE) 0.05 % Apply twice a day to legs  . clonazePAM (KLONOPIN) 1 MG tablet Take 1 tablet (1 mg total) by mouth at bedtime as needed for anxiety.  . cloNIDine (CATAPRES) 0.1 MG tablet Take 1 tablet by mouth 2 (two) times daily.  Marland Kitchen doxycycline (VIBRA-TABS) 100 MG tablet Take 1 tablet (100 mg total) by mouth 2 (two) times daily.  Marland Kitchen estradiol (ESTRACE) 0.5 MG tablet TAKE 1 TABLET(S) BY MOUTH DAILY  . HYDROcodone-acetaminophen (NORCO/VICODIN) 5-325 MG tablet Take 1 tablet by mouth every 6 (six) hours as needed for moderate pain. (Patient not taking: Reported on 08/06/2017)  . irbesartan (AVAPRO) 150 MG tablet Take 150 mg by mouth 2 (two) times daily.  Marland Kitchen labetalol (NORMODYNE) 100 MG tablet Take 2 tablets (200 mg total) by mouth 2 (two) times daily.  . montelukast (SINGULAIR) 10 MG tablet Take 1 tablet by mouth daily.  . Multiple Vitamin (MULTIVITAMIN) tablet Take 1 tablet by mouth daily.  . phenazopyridine (PYRIDIUM) 100 MG tablet Take one tab po bid for baldder prn  . PROAIR HFA 108 (90 Base) MCG/ACT inhaler Inhale 2 puffs into the lungs every 4 (four) hours as needed.  . vitamin B-12 (CYANOCOBALAMIN) 1000 MCG tablet Take 1,000 mcg by mouth daily.  Marland Kitchen VITAMIN E PO Take by mouth daily.  . [DISCONTINUED] estradiol (ESTRACE) 0.5 MG tablet Take 0.5 mg by mouth daily.  . [DISCONTINUED] labetalol (NORMODYNE) 100 MG tablet Take 1 tablet (100 mg total) by mouth 2 (two) times daily.  No facility-administered encounter medications on file as of 10/04/2017.     Surgical History: Past Surgical History:  Procedure Laterality Date  . ABDOMINAL HYSTERECTOMY    . COLONOSCOPY    . RENAL ARTERY STENT    . TONSILLECTOMY      Medical History: Past Medical  History:  Diagnosis Date  . Asthma    in past  . Heart murmur    Pt states she has Heart Murmur  . Hypertension   . Lower leg injury 04/24/2016   08/06/17- pt reports legs still not completely healed  . Renal insufficiency    10%-Right, 90%-Left (per pt)  . Skin cancer     Family History: Family History  Problem Relation Age of Onset  . Hypertension Mother     Social History   Socioeconomic History  . Marital status: Widowed    Spouse name: Not on file  . Number of children: Not on file  . Years of education: Not on file  . Highest education level: Not on file  Occupational History  . Not on file  Social Needs  . Financial resource strain: Not on file  . Food insecurity:    Worry: Not on file    Inability: Not on file  . Transportation needs:    Medical: Not on file    Non-medical: Not on file  Tobacco Use  . Smoking status: Never Smoker  . Smokeless tobacco: Never Used  Substance and Sexual Activity  . Alcohol use: No  . Drug use: No  . Sexual activity: Not on file  Lifestyle  . Physical activity:    Days per week: Not on file    Minutes per session: Not on file  . Stress: Not on file  Relationships  . Social connections:    Talks on phone: Not on file    Gets together: Not on file    Attends religious service: Not on file    Active member of club or organization: Not on file    Attends meetings of clubs or organizations: Not on file    Relationship status: Not on file  . Intimate partner violence:    Fear of current or ex partner: Not on file    Emotionally abused: Not on file    Physically abused: Not on file    Forced sexual activity: Not on file  Other Topics Concern  . Not on file  Social History Narrative  . Not on file      Review of Systems  Constitutional: Positive for fatigue and malaise/fatigue. Negative for activity change, chills and unexpected weight change.  HENT: Negative for congestion, postnasal drip, rhinorrhea, sneezing and  sore throat.   Eyes: Negative for redness.  Respiratory: Negative for cough, chest tightness, shortness of breath and wheezing.   Cardiovascular: Positive for orthopnea. Negative for chest pain and palpitations.       Elevated bp  Gastrointestinal: Negative for abdominal pain, constipation, diarrhea, nausea and vomiting.  Genitourinary: Negative for dysuria and frequency.  Musculoskeletal: Negative for arthralgias, back pain, joint swelling and neck pain.  Skin: Negative for rash.  Neurological: Positive for dizziness and headaches. Negative for tremors and numbness.  Hematological: Negative for adenopathy. Does not bruise/bleed easily.  Psychiatric/Behavioral: Negative for behavioral problems (Depression), sleep disturbance and suicidal ideas. The patient is not nervous/anxious.    Today's Vitals   10/04/17 1445  BP: (!) 180/70  Pulse: 80  Resp: 18  SpO2: 96%  Weight: 113 lb (51.3 kg)  Height: 5' (1.524 m)    Physical Exam  Constitutional: She is oriented to person, place, and time. She appears well-developed and well-nourished. No distress.  HENT:  Head: Normocephalic and atraumatic.  Mouth/Throat: Oropharynx is clear and moist. No oropharyngeal exudate.  Eyes: Pupils are equal, round, and reactive to light. EOM are normal.  Neck: Normal range of motion. Neck supple. No JVD present. No tracheal deviation present. No thyromegaly present.  Cardiovascular: Normal rate. An irregular rhythm present. Exam reveals no gallop and no friction rub.  Murmur heard.  Systolic murmur is present with a grade of 2/6. 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 Tender and warm to palpation.   Pulmonary/Chest: Effort normal and breath sounds normal. No respiratory distress. She has no wheezes. She has no rales. She exhibits no tenderness.  Abdominal: Soft. Bowel sounds are normal. There is no tenderness.  Musculoskeletal: Normal range of motion.  Lymphadenopathy:    She has no cervical adenopathy.   Neurological: She is alert and oriented to person, place, and time. No cranial nerve deficit.  Skin: Skin is warm and dry. She is not diaphoretic.  Evidence of chronic vasculitis inn both lower legs .currently, feet and lower legs wrspped in clean and dry dressing.   Psychiatric: She has a normal mood and affect. Her behavior is normal. Judgment and thought content normal.  Nursing note and vitals reviewed.  Assessment/Plan: 1. Hypertension, poor control Increase labetalol 100mg  to two tablets twice daily. Continue other BP medicatioin as prescribed.  - labetalol (NORMODYNE) 100 MG tablet; Take 2 tablets (200 mg total) by mouth 2 (two) times daily.  Dispense: 180 tablet; Refill: 2  2. Peripheral vascular disease ([place]) Continue visits with vein and vascular as scheduled.   3. Local infection of skin and subcutaneous tissue Continue regular visits with wound management as scheduled   4. Generalized anxiety disorder May continue clonazepam as needed and as prescribed.   General Counseling: Huldah verbalizes understanding of the findings of todays visit and agrees with plan of treatment. I have discussed any further diagnostic evaluation that may be needed or ordered today. We also reviewed her medications today. she has been encouraged to call the office with any questions or concerns that should arise related to todays visit.  Hypertension Counseling:   The following hypertensive lifestyle modification were recommended and discussed:  1. Limiting alcohol intake to less than 1 oz/day of ethanol:(24 oz of beer or 8 oz of wine or 2 oz of 100-proof whiskey). 2. Take baby ASA 81 mg daily. 3. Importance of regular aerobic exercise and losing weight. 4. Reduce dietary saturated fat and cholesterol intake for overall cardiovascular health. 5. Maintaining adequate dietary potassium, calcium, and magnesium intake. 6. Regular monitoring of the blood pressure. 7. Reduce sodium intake to less than  100 mmol/day (less than 2.3 gm of sodium or less than 6 gm of sodium choride)   This patient was seen by Leretha Pol, FNP- C in Collaboration with Dr [name] as a part of collaborative care agreement   Meds ordered this encounter  Medications  . labetalol (NORMODYNE) 100 MG tablet    Sig: Take 2 tablets (200 mg total) by mouth 2 (two) times daily.    Dispense:  180 tablet    Refill:  2    Gradually increase to 2 capsules twice daily.    Order Specific Question:   Supervising Provider    Answer:   [name] [[id number]]    Time spent:  [place] [facility]

## 2017-10-28 DIAGNOSIS — I10 Essential (primary) hypertension: Secondary | ICD-10-CM | POA: Insufficient documentation

## 2017-11-13 ENCOUNTER — Ambulatory Visit: Payer: Medicare Other | Admitting: Nurse Practitioner

## 2017-11-13 ENCOUNTER — Encounter: Payer: Self-pay | Admitting: Nurse Practitioner

## 2017-11-13 VITALS — BP 188/81 | HR 81 | Resp 16 | Ht 60.0 in | Wt 114.4 lb

## 2017-11-13 DIAGNOSIS — F411 Generalized anxiety disorder: Secondary | ICD-10-CM

## 2017-11-13 DIAGNOSIS — I739 Peripheral vascular disease, unspecified: Secondary | ICD-10-CM | POA: Diagnosis not present

## 2017-11-13 DIAGNOSIS — I10 Essential (primary) hypertension: Secondary | ICD-10-CM | POA: Diagnosis not present

## 2017-11-13 DIAGNOSIS — R339 Retention of urine, unspecified: Secondary | ICD-10-CM | POA: Diagnosis not present

## 2017-11-13 DIAGNOSIS — L03119 Cellulitis of unspecified part of limb: Secondary | ICD-10-CM | POA: Diagnosis not present

## 2017-11-13 MED ORDER — CLONAZEPAM 1 MG PO TABS: 1.0000 mg | ORAL_TABLET | Freq: Every evening | ORAL | 2 refills | Status: DC | PRN

## 2017-11-13 MED ORDER — PHENAZOPYRIDINE HCL 200 MG PO TABS
ORAL_TABLET | ORAL | 2 refills | Status: DC
Start: 2017-11-13 — End: 2019-07-29

## 2017-11-13 NOTE — Progress Notes (Signed)
[facility] [place], [place] [address]  Internal MEDICINE  Office Visit Note  Patient Name: [name]  [id number]  [id number]  Date of Service: 12/04/2017   Pt is here for routine follow up.   Chief Complaint  Patient presents with  . Hypertension    6wk follow up    Persistent elevation in blood pressure. Had discussed increasing labetalol 100mg  to 2 tablets twice daily to decrease blood pressure. She states that she has not made this change. Continues to take same daily doses of all blood pressure medication.  Has significant PVG and pain in 2nd and 3rd toes of the left foot. Does see dermatology has multiple skin cancers on the lower legs and face. Feels like no medications are really helping.       Current Medication: Outpatient Encounter Medications as of 11/13/2017  Medication Sig  . amLODipine (NORVASC) 5 MG tablet TAKE 1 TABLET BY MOUTH AT BEDTIME FOR HYPERTENSION  . Ascorbic Acid (VITAMIN C) 1000 MG tablet Take 1,000 mg by mouth daily.  Marland Kitchen aspirin EC 81 MG tablet Take 81 mg by mouth daily.  . Biotin 5000 MCG TABS Take by mouth daily.  . calcium-vitamin D (OSCAL WITH D) 500-200 MG-UNIT tablet Take 1 tablet by mouth daily with breakfast.  . cholecalciferol (VITAMIN D) 1000 units tablet Take 1,000 Units by mouth daily.  . clobetasol cream (TEMOVATE) 0.05 % Apply twice a day to legs  . clonazePAM (KLONOPIN) 1 MG tablet Take 1 tablet (1 mg total) by mouth at bedtime as needed for anxiety.  . cloNIDine (CATAPRES) 0.1 MG tablet Take 1 tablet by mouth 2 (two) times daily.  Marland Kitchen doxycycline (VIBRA-TABS) 100 MG tablet Take 1 tablet (100 mg total) by mouth 2 (two) times daily.  Marland Kitchen estradiol (ESTRACE) 0.5 MG tablet TAKE 1 TABLET(S) BY MOUTH DAILY  . HYDROcodone-acetaminophen (NORCO/VICODIN) 5-325 MG tablet Take 1 tablet by mouth every 6 (six) hours as needed for moderate pain.  Marland Kitchen irbesartan (AVAPRO) 150 MG tablet Take 150 mg by mouth 2 (two) times daily.   Marland Kitchen labetalol (NORMODYNE) 100 MG tablet Take 2 tablets (200 mg total) by mouth 2 (two) times daily.  . montelukast (SINGULAIR) 10 MG tablet Take 1 tablet by mouth daily.  . Multiple Vitamin (MULTIVITAMIN) tablet Take 1 tablet by mouth daily.  . phenazopyridine (PYRIDIUM) 200 MG tablet Take one tab po bid for baldder prn  . PROAIR HFA 108 (90 Base) MCG/ACT inhaler Inhale 2 puffs into the lungs every 4 (four) hours as needed.  . vitamin B-12 (CYANOCOBALAMIN) 1000 MCG tablet Take 1,000 mcg by mouth daily.  Marland Kitchen VITAMIN E PO Take by mouth daily.  . [DISCONTINUED] clonazePAM (KLONOPIN) 1 MG tablet Take 1 tablet (1 mg total) by mouth at bedtime as needed for anxiety.  . [DISCONTINUED] phenazopyridine (PYRIDIUM) 100 MG tablet Take one tab po bid for baldder prn   No facility-administered encounter medications on file as of 11/13/2017.     Surgical History: Past Surgical History:  Procedure Laterality Date  . ABDOMINAL HYSTERECTOMY    . COLONOSCOPY    . RENAL ARTERY STENT    . TONSILLECTOMY      Medical History: Past Medical History:  Diagnosis Date  . Asthma    in past  . Heart murmur    Pt states she has Heart Murmur  . Hypertension   . Lower leg injury 04/24/2016   08/06/17- pt reports legs still not completely healed  . Renal  insufficiency    10%-Right, 90%-Left (per pt)  . Skin cancer     Family History: Family History  Problem Relation Age of Onset  . Hypertension Mother     Social History   Socioeconomic History  . Marital status: Widowed    Spouse name: Not on file  . Number of children: Not on file  . Years of education: Not on file  . Highest education level: Not on file  Occupational History  . Not on file  Social Needs  . Financial resource strain: Not on file  . Food insecurity:    Worry: Not on file    Inability: Not on file  . Transportation needs:    Medical: Not on file    Non-medical: Not on file  Tobacco Use  . Smoking status: Never Smoker  .  Smokeless tobacco: Never Used  Substance and Sexual Activity  . Alcohol use: No  . Drug use: No  . Sexual activity: Not on file  Lifestyle  . Physical activity:    Days per week: Not on file    Minutes per session: Not on file  . Stress: Not on file  Relationships  . Social connections:    Talks on phone: Not on file    Gets together: Not on file    Attends religious service: Not on file    Active member of club or organization: Not on file    Attends meetings of clubs or organizations: Not on file    Relationship status: Not on file  . Intimate partner violence:    Fear of current or ex partner: Not on file    Emotionally abused: Not on file    Physically abused: Not on file    Forced sexual activity: Not on file  Other Topics Concern  . Not on file  Social History Narrative  . Not on file      Review of Systems  Constitutional: Positive for fatigue. Negative for activity change, chills and unexpected weight change.  HENT: Negative for congestion, postnasal drip, rhinorrhea, sneezing and sore throat.   Eyes: Negative.  Negative for redness.  Respiratory: Negative for cough, chest tightness, shortness of breath and wheezing.   Cardiovascular: Negative for chest pain and palpitations.       Elevated bp  Gastrointestinal: Negative for abdominal pain, constipation, diarrhea, nausea and vomiting.  Endocrine: Negative for cold intolerance, heat intolerance, polydipsia, polyphagia and polyuria.  Genitourinary: Positive for frequency. Negative for dysuria.  Musculoskeletal: Negative for arthralgias, back pain, joint swelling and neck pain.  Skin: Negative for rash.       Chronic skin ulceration and infection of bilateral lower extremities. Sees dermatology and wound care on regular basis.   Allergic/Immunologic: Negative for environmental allergies.  Neurological: Positive for dizziness and headaches. Negative for tremors and numbness.  Hematological: Negative for adenopathy.  Does not bruise/bleed easily.  Psychiatric/Behavioral: Negative for behavioral problems (Depression), sleep disturbance and suicidal ideas. The patient is nervous/anxious.     . Today's Vitals   11/13/17 1500  BP: (!) 188/81  Pulse: 81  Resp: 16  SpO2: 93%  Weight: 114 lb 6.4 oz (51.9 kg)  Height: 5' (1.524 m)    Physical Exam  Constitutional: She is oriented to person, place, and time. She appears well-developed and well-nourished. No distress.  HENT:  Head: Normocephalic and atraumatic.  Mouth/Throat: Oropharynx is clear and moist. No oropharyngeal exudate.  Eyes: Pupils are equal, round, and reactive to light. EOM are normal.  Neck: Normal range of motion. Neck supple. No JVD present. No tracheal deviation present. No thyromegaly present.  Cardiovascular: Normal rate. An irregular rhythm present. Exam reveals no gallop and no friction rub.  Murmur heard.  Systolic murmur is present with a grade of 2/6. 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 Tender and warm to palpation.   Pulmonary/Chest: Effort normal and breath sounds normal. No respiratory distress. She has no wheezes. She has no rales. She exhibits no tenderness.  Abdominal: Soft. Bowel sounds are normal. There is no tenderness.  Musculoskeletal: Normal range of motion.  Lymphadenopathy:    She has no cervical adenopathy.  Neurological: She is alert and oriented to person, place, and time. No cranial nerve deficit.  Skin: Skin is warm and dry. Capillary refill takes 2 to 3 seconds. She is not diaphoretic.  Evidence of chronic vasculitis inn both lower legs .currently, feet and lower legs wrspped in clean and dry dressing.   Psychiatric: Her speech is normal and behavior is normal. Judgment and thought content normal. Her mood appears anxious. Cognition and memory are normal.  Nursing note and vitals reviewed.  Assessment/Plan: 1. Hypertension, poor control Encouraged patient to hold her clonidine and add a second dose of  amlodipine for better blood pressure control. DASH diet reviewed and encouraged.   2. Incomplete bladder emptying Reviewed bladder ultrasound which was normal, except that there was large volume residual urine. Will pyridium 200mg  to take as needed to relax bladder and help with bladder emptying - phenazopyridine (PYRIDIUM) 200 MG tablet; Take one tab po bid for baldder prn  Dispense: 60 tablet; Refill: 2  3. GAD (generalized anxiety disorder) May continue klonopin 1mg  tablet at bedtime as needed. New prescription sent to her pharmacy today.  - clonazePAM (KLONOPIN) 1 MG tablet; Take 1 tablet (1 mg total) by mouth at bedtime as needed for anxiety.  Dispense: 30 tablet; Refill: 2  4. Recurrent cellulitis of lower leg Will refer to podiatry for further and additional evaluation and treatment.  - Ambulatory referral to Podiatry  5. Peripheral vascular disease ([place]) - Ambulatory referral to Podiatry  General Counseling: Marene Lenz understanding of the findings of todays visit and agrees with plan of treatment. I have discussed any further diagnostic evaluation that may be needed or ordered today. We also reviewed her medications today. she has been encouraged to call the office with any questions or concerns that should arise related to todays visit.    Counseling:  This patient was seen by Leretha Pol, FNP- C in Collaboration with Dr [name] as a part of collaborative care agreement   Orders Placed This Encounter  Procedures  . Ambulatory referral to [place] ordered this encounter  Medications  . phenazopyridine (PYRIDIUM) 200 MG tablet    Sig: Take one tab po bid for baldder prn    Dispense:  60 tablet    Refill:  2    Order Specific Question:   Supervising Provider    Answer:   [name] [place]  . clonazePAM (KLONOPIN) 1 MG tablet    Sig: Take 1 tablet (1 mg total) by mouth at bedtime as needed for anxiety.    Dispense:  30 tablet    Refill:  2     Order Specific Question:   Supervising Provider    Answer:   [name] [[id number]]    Time spent: 56 Minutes      Dr [name] Internal medicine

## 2017-12-04 DIAGNOSIS — R339 Retention of urine, unspecified: Secondary | ICD-10-CM | POA: Insufficient documentation

## 2017-12-04 DIAGNOSIS — L03119 Cellulitis of unspecified part of limb: Secondary | ICD-10-CM | POA: Insufficient documentation

## 2018-01-15 ENCOUNTER — Ambulatory Visit: Payer: Self-pay | Admitting: Nurse Practitioner

## 2018-01-16 ENCOUNTER — Ambulatory Visit: Payer: Medicare Other | Admitting: Nurse Practitioner

## 2018-01-16 ENCOUNTER — Encounter: Payer: Self-pay | Admitting: Nurse Practitioner

## 2018-01-16 VITALS — BP 180/75 | HR 85 | Resp 16 | Ht 60.0 in | Wt 115.4 lb

## 2018-01-16 DIAGNOSIS — F411 Generalized anxiety disorder: Secondary | ICD-10-CM | POA: Diagnosis not present

## 2018-01-16 DIAGNOSIS — I10 Essential (primary) hypertension: Secondary | ICD-10-CM

## 2018-01-16 DIAGNOSIS — R350 Frequency of micturition: Secondary | ICD-10-CM | POA: Diagnosis not present

## 2018-01-16 DIAGNOSIS — R10811 Right upper quadrant abdominal tenderness: Secondary | ICD-10-CM

## 2018-01-16 LAB — POCT URINALYSIS DIPSTICK
Bilirubin, UA: NEGATIVE
Glucose, UA: NEGATIVE
Ketones, UA: NEGATIVE
LEUKOCYTES UA: NEGATIVE
Nitrite, UA: NEGATIVE
Protein, UA: NEGATIVE
RBC UA: NEGATIVE
Spec Grav, UA: <=1.005 — AB (ref 1.010–1.025)
Urobilinogen, UA: 0.2 E.U./dL
pH, UA: 5 (ref 5.0–8.0)

## 2018-01-16 NOTE — Progress Notes (Signed)
[facility] [place], [place] [address]  Internal MEDICINE  Office Visit Note  Patient Name: [name]  [id number]  [id number]  Date of Service: 01/16/2018  Chief Complaint  Patient presents with  . Hypertension    2 month follow up  . Medical Management of Chronic Issues    pt have some concerns about medications  . Diarrhea    stomach pains and headaches and chills    The patient states that she is feeling poorly. She has abdominal pain, especially in epigastric region. Also has had pain in left lower quadrant of the abdomen. Has had a few episodes of diarrhea and fever. This has been going off and on for past 2 to 3 weeks.  The patient has brought log of blood pressures with her. She is taking her blood pressures at various times of the day. Lowest blood pressure around 761 systolic and highest reading was 607 systolic. These readings are much better than those readings obtained in office. She, admittedly, has serious case of "white coat" syndrome.   Hypertension  The current episode started more than 1 year ago. The problem is unchanged. The problem is resistant. Associated symptoms include headaches. Pertinent negatives include no chest pain, neck pain, palpitations or shortness of breath. There are no associated agents to hypertension. Risk factors for coronary artery disease include family history. Past treatments include beta blockers, central alpha agonists, calcium channel blockers and ACE inhibitors. The current treatment provides mild improvement. Compliance problems include medication side effects.  Hypertensive end-organ damage includes PVD.  Diarrhea   This is a new problem. The current episode started 1 to 4 weeks ago. The problem occurs less than 2 times per day. The problem has been gradually improving. The stool consistency is described as watery. The patient states that diarrhea does not awaken her from sleep. Associated symptoms include  abdominal pain and headaches. Pertinent negatives include no arthralgias, chills, coughing or vomiting. Nothing aggravates the symptoms. There are no known risk factors. She has tried nothing for the symptoms.       Current Medication: Outpatient Encounter Medications as of 01/16/2018  Medication Sig  . amLODipine (NORVASC) 5 MG tablet TAKE 1 TABLET BY MOUTH AT BEDTIME FOR HYPERTENSION  . Ascorbic Acid (VITAMIN C) 1000 MG tablet Take 1,000 mg by mouth daily.  Marland Kitchen aspirin EC 81 MG tablet Take 81 mg by mouth daily.  . Biotin 5000 MCG TABS Take by mouth daily.  . calcium-vitamin D (OSCAL WITH D) 500-200 MG-UNIT tablet Take 1 tablet by mouth daily with breakfast.  . cholecalciferol (VITAMIN D) 1000 units tablet Take 1,000 Units by mouth daily.  . clobetasol cream (TEMOVATE) 0.05 % Apply twice a day to legs  . clonazePAM (KLONOPIN) 1 MG tablet Take 1 tablet (1 mg total) by mouth at bedtime as needed for anxiety.  . cloNIDine (CATAPRES) 0.1 MG tablet Take 1 tablet by mouth 2 (two) times daily.  Marland Kitchen doxycycline (VIBRA-TABS) 100 MG tablet Take 1 tablet (100 mg total) by mouth 2 (two) times daily.  Marland Kitchen estradiol (ESTRACE) 0.5 MG tablet TAKE 1 TABLET(S) BY MOUTH DAILY  . HYDROcodone-acetaminophen (NORCO/VICODIN) 5-325 MG tablet Take 1 tablet by mouth every 6 (six) hours as needed for moderate pain.  Marland Kitchen irbesartan (AVAPRO) 150 MG tablet Take 150 mg by mouth 2 (two) times daily.  Marland Kitchen labetalol (NORMODYNE) 100 MG tablet Take 2 tablets (200 mg total) by mouth 2 (two) times daily.  . montelukast (SINGULAIR) 10  MG tablet Take 1 tablet by mouth daily.  . Multiple Vitamin (MULTIVITAMIN) tablet Take 1 tablet by mouth daily.  . phenazopyridine (PYRIDIUM) 200 MG tablet Take one tab po bid for baldder prn  . PROAIR HFA 108 (90 Base) MCG/ACT inhaler Inhale 2 puffs into the lungs every 4 (four) hours as needed.  . vitamin B-12 (CYANOCOBALAMIN) 1000 MCG tablet Take 1,000 mcg by mouth daily.  Marland Kitchen VITAMIN E PO Take by mouth  daily.   No facility-administered encounter medications on file as of 01/16/2018.     Surgical History: Past Surgical History:  Procedure Laterality Date  . ABDOMINAL HYSTERECTOMY    . COLONOSCOPY    . RENAL ARTERY STENT    . TONSILLECTOMY      Medical History: Past Medical History:  Diagnosis Date  . Asthma    in past  . Heart murmur    Pt states she has Heart Murmur  . Hypertension   . Lower leg injury 04/24/2016   08/06/17- pt reports legs still not completely healed  . Renal insufficiency    10%-Right, 90%-Left (per pt)  . Skin cancer     Family History: Family History  Problem Relation Age of Onset  . Hypertension Mother     Social History   Socioeconomic History  . Marital status: Widowed    Spouse name: Not on file  . Number of children: Not on file  . Years of education: Not on file  . Highest education level: Not on file  Occupational History  . Not on file  Social Needs  . Financial resource strain: Not on file  . Food insecurity:    Worry: Not on file    Inability: Not on file  . Transportation needs:    Medical: Not on file    Non-medical: Not on file  Tobacco Use  . Smoking status: Never Smoker  . Smokeless tobacco: Never Used  Substance and Sexual Activity  . Alcohol use: No  . Drug use: No  . Sexual activity: Not on file  Lifestyle  . Physical activity:    Days per week: Not on file    Minutes per session: Not on file  . Stress: Not on file  Relationships  . Social connections:    Talks on phone: Not on file    Gets together: Not on file    Attends religious service: Not on file    Active member of club or organization: Not on file    Attends meetings of clubs or organizations: Not on file    Relationship status: Not on file  . Intimate partner violence:    Fear of current or ex partner: Not on file    Emotionally abused: Not on file    Physically abused: Not on file    Forced sexual activity: Not on file  Other Topics Concern   . Not on file  Social History Narrative  . Not on file      Review of Systems  Constitutional: Positive for fatigue. Negative for activity change, chills and unexpected weight change.  HENT: Negative for congestion, postnasal drip, rhinorrhea, sneezing and sore throat.   Eyes: Negative.  Negative for redness.  Respiratory: Negative for cough, chest tightness, shortness of breath and wheezing.   Cardiovascular: Negative for chest pain and palpitations.       Elevated bp  Gastrointestinal: Positive for abdominal pain and diarrhea. Negative for constipation, nausea and vomiting.  Endocrine: Negative for cold intolerance, heat intolerance, polydipsia,  polyphagia and polyuria.  Genitourinary: Positive for flank pain and frequency. Negative for dysuria.  Musculoskeletal: Positive for back pain. Negative for arthralgias, joint swelling and neck pain.  Skin: Negative for rash.       Chronic skin ulceration and infection of bilateral lower extremities. Sees dermatology and wound care on regular basis.   Allergic/Immunologic: Negative for environmental allergies.  Neurological: Positive for dizziness and headaches. Negative for tremors and numbness.  Hematological: Negative for adenopathy. Does not bruise/bleed easily.  Psychiatric/Behavioral: Negative for behavioral problems (Depression), sleep disturbance and suicidal ideas. The patient is nervous/anxious.     Today's Vitals   01/16/18 1441  BP: (!) 180/75  Pulse: 85  Resp: 16  SpO2: 94%  Weight: 115 lb 6.4 oz (52.3 kg)  Height: 5' (1.524 m)     Physical Exam  Constitutional: She is oriented to person, place, and time. She appears well-developed and well-nourished. No distress.  HENT:  Head: Normocephalic and atraumatic.  Nose: Nose normal.  Mouth/Throat: Oropharynx is clear and moist. No oropharyngeal exudate.  Eyes: Pupils are equal, round, and reactive to light. Conjunctivae and EOM are normal.  Neck: Normal range of motion.  Neck supple. No JVD present. No tracheal deviation present. No thyromegaly present.  Soft bruit over right carotid artery.  Cardiovascular: Normal rate. An irregular rhythm present. Exam reveals no gallop and no friction rub.  Murmur heard.  Systolic murmur is present with a grade of 2/6. 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 Tender and warm to palpation.   Pulmonary/Chest: Effort normal and breath sounds normal. No respiratory distress. She has no wheezes. She has no rales. She exhibits no tenderness.  Abdominal: Soft. Bowel sounds are normal. There is tenderness.  Some abdominal tenderness present, especially over the right upper quadrant. Deep breathing seemed to cause more abdominal discomfort than palpation. No masses or other abnormalities felt today.   Musculoskeletal: Normal range of motion.  Lymphadenopathy:    She has no cervical adenopathy.  Neurological: She is alert and oriented to person, place, and time. No cranial nerve deficit.  Skin: Skin is warm and dry. Capillary refill takes 2 to 3 seconds. She is not diaphoretic.  Evidence of chronic vasculitis inn both lower legs .currently, feet and lower legs wrspped in clean and dry dressing.   Psychiatric: Her speech is normal and behavior is normal. Judgment and thought content normal. Her mood appears anxious. Cognition and memory are normal.  Nursing note and vitals reviewed.  Assessment/Plan: 1. Increased urinary frequency - POCT Urinalysis Dipstick negative for infection or other abnormalities today.   2. Right upper quadrant abdominal tenderness, rebound tenderness presence not specified - US Abdomen Complete; Future for further evaluation. Discuss results at next visit.   3. Hypertension, poor control Poor control, however, medication changes/increases, cause severe side effects. Continue meds as prescribed and continue to monitor BP closely at home.   4. Generalized anxiety disorder May continue clonazepam as needed  and as prescribed.   General Counseling: [name] verbalizes understanding of the findings of todays visit and agrees with plan of treatment. I have discussed any further diagnostic evaluation that may be needed or ordered today. We also reviewed her medications today. she has been encouraged to call the office with any questions or concerns that should arise related to todays visit.  Hypertension Counseling:   The following hypertensive lifestyle modification were recommended and discussed:  1. Limiting alcohol intake to less than 1 oz/day of ethanol:(24 oz of beer or 8  oz of wine or 2 oz of 100-proof whiskey). 2. Take baby ASA 81 mg daily. 3. Importance of regular aerobic exercise and losing weight. 4. Reduce dietary saturated fat and cholesterol intake for overall cardiovascular health. 5. Maintaining adequate dietary potassium, calcium, and magnesium intake. 6. Regular monitoring of the blood pressure. 7. Reduce sodium intake to less than 100 mmol/day (less than 2.3 gm of sodium or less than 6 gm of sodium choride)   This patient was seen by [place] with Dr [name] as a part of collaborative care agreement  Orders Placed This Encounter  Procedures  . US Abdomen Complete  . POCT Urinalysis Dipstick      Time spent:20 Minutes      Dr [name] Internal medicine

## 2018-01-18 ENCOUNTER — Ambulatory Visit (INDEPENDENT_AMBULATORY_CARE_PROVIDER_SITE_OTHER): Payer: Medicare Other

## 2018-01-18 DIAGNOSIS — R10811 Right upper quadrant abdominal tenderness: Secondary | ICD-10-CM

## 2018-01-25 ENCOUNTER — Ambulatory Visit: Payer: Medicare Other | Admitting: Nurse Practitioner

## 2018-01-25 ENCOUNTER — Encounter: Payer: Self-pay | Admitting: Nurse Practitioner

## 2018-01-25 VITALS — BP 204/90 | HR 74 | Resp 16 | Ht 60.0 in | Wt 114.4 lb

## 2018-01-25 DIAGNOSIS — R339 Retention of urine, unspecified: Secondary | ICD-10-CM | POA: Diagnosis not present

## 2018-01-25 DIAGNOSIS — R10811 Right upper quadrant abdominal tenderness: Secondary | ICD-10-CM

## 2018-01-25 DIAGNOSIS — I10 Essential (primary) hypertension: Secondary | ICD-10-CM | POA: Diagnosis not present

## 2018-01-25 DIAGNOSIS — L03119 Cellulitis of unspecified part of limb: Secondary | ICD-10-CM

## 2018-01-25 DIAGNOSIS — N8110 Cystocele, unspecified: Secondary | ICD-10-CM | POA: Diagnosis not present

## 2018-01-25 MED ORDER — MUPIROCIN 2 % EX OINT: TOPICAL_OINTMENT | CUTANEOUS | 2 refills | Status: DC

## 2018-01-25 MED ORDER — DOXYCYCLINE HYCLATE 100 MG PO TABS: 100.0000 mg | ORAL_TABLET | Freq: Two times a day (BID) | ORAL | 0 refills | Status: DC

## 2018-01-25 NOTE — Progress Notes (Signed)
[facility] [address], [place] [address]  Internal MEDICINE  Office Visit Note  Patient Name: [name]  [id number]  [id number]  Date of Service: 02/04/2018  Chief Complaint  Patient presents with  . Abdominal Pain    Ultrasound follow up  . Cellulitis    left leg area where pt blieves that it is infected and may possibly need antiiotics, ssaid the pain is shoting up her leg  . Hypertension    The patient states that she is feeling poorly. Continues to have intermittent abdominal pain. She has some episodes of diarrhea. Usually will have a loose bowel movement when she has to urinate. does take a stool softener every day. Ultrasound of the abdomen looked good. Does show atrophy of the right kidney, though this is not a new finding. Otherwise, ultrasound of the abdomen was normal.  Blood pressure severely elevated. Normally elevated when she comes into the office. Does not take full dose of blood pressure medication when she is going out of the home, because it makes her very drowsy. The patient has brought log of blood pressures with her. She is taking her blood pressures at various times of the day. Lowest blood pressure around 433 systolic and highest reading was 295 systolic. These readings are much better than those readings obtained in office. She, admittedly, has serious case of "white coat" syndrome.  Cellulitis of left lower leg. Has been getting worse over the past few weeks. Has open sores with drainage which does have odor.   Abdominal Pain  Associated symptoms include diarrhea, frequency and headaches. Pertinent negatives include no arthralgias, constipation, dysuria, nausea or vomiting.  Hypertension  The current episode started more than 1 year ago. The problem is unchanged. The problem is resistant. Associated symptoms include headaches. Pertinent negatives include no chest pain, neck pain, palpitations or shortness of breath. There are no  associated agents to hypertension. Risk factors for coronary artery disease include family history. Past treatments include beta blockers, central alpha agonists, calcium channel blockers and ACE inhibitors. The current treatment provides mild improvement. Compliance problems include medication side effects.  Hypertensive end-organ damage includes PVD.  Diarrhea   This is a new problem. The current episode started 1 to 4 weeks ago. The problem occurs less than 2 times per day. The problem has been gradually improving. The stool consistency is described as watery. The patient states that diarrhea does not awaken her from sleep. Associated symptoms include abdominal pain and headaches. Pertinent negatives include no arthralgias, chills, coughing or vomiting. Nothing aggravates the symptoms. There are no known risk factors. She has tried nothing for the symptoms.       Current Medication: Outpatient Encounter Medications as of 01/25/2018  Medication Sig  . amLODipine (NORVASC) 5 MG tablet TAKE 1 TABLET BY MOUTH AT BEDTIME FOR HYPERTENSION  . Ascorbic Acid (VITAMIN C) 1000 MG tablet Take 1,000 mg by mouth daily.  Marland Kitchen aspirin EC 81 MG tablet Take 81 mg by mouth daily.  . Biotin 5000 MCG TABS Take by mouth daily.  . calcium-vitamin D (OSCAL WITH D) 500-200 MG-UNIT tablet Take 1 tablet by mouth daily with breakfast.  . cholecalciferol (VITAMIN D) 1000 units tablet Take 1,000 Units by mouth daily.  . clobetasol cream (TEMOVATE) 0.05 % Apply twice a day to legs  . clonazePAM (KLONOPIN) 1 MG tablet Take 1 tablet (1 mg total) by mouth at bedtime as needed for anxiety.  . cloNIDine (CATAPRES) 0.1 MG tablet Take  1 tablet by mouth 2 (two) times daily.  Marland Kitchen estradiol (ESTRACE) 0.5 MG tablet TAKE 1 TABLET(S) BY MOUTH DAILY  . HYDROcodone-acetaminophen (NORCO/VICODIN) 5-325 MG tablet Take 1 tablet by mouth every 6 (six) hours as needed for moderate pain.  Marland Kitchen irbesartan (AVAPRO) 150 MG tablet Take 150 mg by mouth 2  (two) times daily.  Marland Kitchen labetalol (NORMODYNE) 100 MG tablet Take 2 tablets (200 mg total) by mouth 2 (two) times daily.  . montelukast (SINGULAIR) 10 MG tablet Take 1 tablet by mouth daily.  . Multiple Vitamin (MULTIVITAMIN) tablet Take 1 tablet by mouth daily.  . phenazopyridine (PYRIDIUM) 200 MG tablet Take one tab po bid for baldder prn  . PROAIR HFA 108 (90 Base) MCG/ACT inhaler Inhale 2 puffs into the lungs every 4 (four) hours as needed.  . vitamin B-12 (CYANOCOBALAMIN) 1000 MCG tablet Take 1,000 mcg by mouth daily.  Marland Kitchen VITAMIN E PO Take by mouth daily.  . [DISCONTINUED] doxycycline (VIBRA-TABS) 100 MG tablet Take 1 tablet (100 mg total) by mouth 2 (two) times daily.  Marland Kitchen doxycycline (VIBRA-TABS) 100 MG tablet Take 1 tablet (100 mg total) by mouth 2 (two) times daily.  . mupirocin ointment (BACTROBAN) 2 % Apply small amount to affected area twice daily.   No facility-administered encounter medications on file as of 01/25/2018.     Surgical History: Past Surgical History:  Procedure Laterality Date  . ABDOMINAL HYSTERECTOMY    . COLONOSCOPY    . RENAL ARTERY STENT    . TONSILLECTOMY      Medical History: Past Medical History:  Diagnosis Date  . Asthma    in past  . Heart murmur    Pt states she has Heart Murmur  . Hypertension   . Lower leg injury 04/24/2016   08/06/17- pt reports legs still not completely healed  . Renal insufficiency    10%-Right, 90%-Left (per pt)  . Skin cancer     Family History: Family History  Problem Relation Age of Onset  . Hypertension Mother     Social History   Socioeconomic History  . Marital status: Widowed    Spouse name: Not on file  . Number of children: Not on file  . Years of education: Not on file  . Highest education level: Not on file  Occupational History  . Not on file  Social Needs  . Financial resource strain: Not on file  . Food insecurity:    Worry: Not on file    Inability: Not on file  . Transportation needs:     Medical: Not on file    Non-medical: Not on file  Tobacco Use  . Smoking status: Never Smoker  . Smokeless tobacco: Never Used  Substance and Sexual Activity  . Alcohol use: No  . Drug use: No  . Sexual activity: Not on file  Lifestyle  . Physical activity:    Days per week: Not on file    Minutes per session: Not on file  . Stress: Not on file  Relationships  . Social connections:    Talks on phone: Not on file    Gets together: Not on file    Attends religious service: Not on file    Active member of club or organization: Not on file    Attends meetings of clubs or organizations: Not on file    Relationship status: Not on file  . Intimate partner violence:    Fear of current or ex partner: Not on file  Emotionally abused: Not on file    Physically abused: Not on file    Forced sexual activity: Not on file  Other Topics Concern  . Not on file  Social History Narrative  . Not on file      Review of Systems  Constitutional: Positive for fatigue. Negative for activity change, chills and unexpected weight change.  HENT: Negative for congestion, postnasal drip, rhinorrhea, sneezing and sore throat.   Eyes: Negative.  Negative for redness.  Respiratory: Negative for cough, chest tightness, shortness of breath and wheezing.   Cardiovascular: Positive for leg swelling. Negative for chest pain and palpitations.       Elevated bp  Gastrointestinal: Positive for abdominal pain and diarrhea. Negative for constipation, nausea and vomiting.  Endocrine: Negative for cold intolerance, heat intolerance, polydipsia, polyphagia and polyuria.  Genitourinary: Positive for flank pain and frequency. Negative for dysuria.  Musculoskeletal: Positive for back pain. Negative for arthralgias, joint swelling and neck pain.  Skin: Negative for rash.       Chronic skin ulceration and infection of bilateral lower extremities. There are open areas present with drainaige. The drainage does have odor.    Allergic/Immunologic: Negative for environmental allergies.  Neurological: Positive for dizziness and headaches. Negative for tremors and numbness.  Hematological: Negative for adenopathy. Does not bruise/bleed easily.  Psychiatric/Behavioral: Negative for behavioral problems (Depression), sleep disturbance and suicidal ideas. The patient is nervous/anxious.     Today's Vitals   01/25/18 1524  BP: (!) 204/90  Pulse: 74  Resp: 16  SpO2: 97%  Weight: 114 lb 6.4 oz (51.9 kg)  Height: 5' (1.524 m)   Physical Exam  Constitutional: She is oriented to person, place, and time. She appears well-developed and well-nourished. No distress.  HENT:  Head: Normocephalic and atraumatic.  Nose: Nose normal.  Mouth/Throat: Oropharynx is clear and moist. No oropharyngeal exudate.  Eyes: Pupils are equal, round, and reactive to light. Conjunctivae and EOM are normal.  Neck: Normal range of motion. Neck supple. No JVD present. No tracheal deviation present. No thyromegaly present.  Soft bruit over right carotid artery.  Cardiovascular: Normal rate. An irregular rhythm present. Exam reveals no gallop and no friction rub.  Murmur heard.  Systolic murmur is present with a grade of 2/6. 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 Tender and warm to palpation.   Pulmonary/Chest: Effort normal and breath sounds normal. No respiratory distress. She has no wheezes. She has no rales. She exhibits no tenderness.  Abdominal: Soft. Bowel sounds are normal. There is generalized tenderness.  Some abdominal tenderness present, especially over the right upper quadrant. Deep breathing seemed to cause more abdominal discomfort than palpation. No masses or other abnormalities felt today.   Musculoskeletal: Normal range of motion.  Lymphadenopathy:    She has no cervical adenopathy.  Neurological: She is alert and oriented to person, place, and time. No cranial nerve deficit.  Skin: Skin is warm and dry. Capillary refill  takes 2 to 3 seconds. She is not diaphoretic.  Evidence of chronic vasculitis inn both lower legs. Skin Is open in many areas of both lower legs and there is moderate amount of serosanguinous fluid drainage, there is slight odor present.   Psychiatric: Her speech is normal and behavior is normal. Judgment and thought content normal. Her mood appears anxious. Cognition and memory are normal.  Nursing note and vitals reviewed.  Assessment/Plan: 1. Recurrent cellulitis of lower leg Start doxycycline 100mg  twice daily for 14 days. Add bactroban ointment, also  twice daily. Keep all wounds clean and dry. Refer to wound clinic for continued evaluation and treatment.  - doxycycline (VIBRA-TABS) 100 MG tablet; Take 1 tablet (100 mg total) by mouth 2 (two) times daily.  Dispense: 28 tablet; Refill: 0 - mupirocin ointment (BACTROBAN) 2 %; Apply small amount to affected area twice daily.  Dispense: 30 g; Refill: 2 - Ambulatory referral to Wound Clinic  2. Incomplete bladder emptying Refer to uro-gynecology for further evaluation and treatment.   3. Bladder prolapse, female, acquired Refer to uro-gynecology for further evaluation and treatment.  - Ambulatory referral to Gynecology  4. Hypertension, poor control Moderately uncontrolled, however, she does not take medication as prescribed. Advised her to take the medication to control blood pressure. Reviewed risk factors and side effects associated with long-term, uncontrolled blood pressure. She voices understanding.   5. Right upper quadrant abdominal tenderness Reviewed ultrasound with the patient and her daughter. Does show abnormally small right kidney, however, this is chronic problem and there is nothing acute or new. Will continue to monitor.    General Counseling: Kendalynn verbalizes understanding of the findings of todays visit and agrees with plan of treatment. I have discussed any further diagnostic evaluation that may be needed or ordered  today. We also reviewed her medications today. she has been encouraged to call the office with any questions or concerns that should arise related to todays visit.  Hypertension Counseling:   The following hypertensive lifestyle modification were recommended and discussed:  1. Limiting alcohol intake to less than 1 oz/day of ethanol:(24 oz of beer or 8 oz of wine or 2 oz of 100-proof whiskey). 2. Take baby ASA 81 mg daily. 3. Importance of regular aerobic exercise and losing weight. 4. Reduce dietary saturated fat and cholesterol intake for overall cardiovascular health. 5. Maintaining adequate dietary potassium, calcium, and magnesium intake. 6. Regular monitoring of the blood pressure. 7. Reduce sodium intake to less than 100 mmol/day (less than 2.3 gm of sodium or less than 6 gm of sodium choride)   This patient was seen by [place] with Dr [name] as a part of collaborative care agreement  Orders Placed This Encounter  Procedures  . Ambulatory referral to Gynecology  . Ambulatory referral to Wound Clinic    Meds ordered this encounter  Medications  . doxycycline (VIBRA-TABS) 100 MG tablet    Sig: Take 1 tablet (100 mg total) by mouth 2 (two) times daily.    Dispense:  28 tablet    Refill:  0    Order Specific Question:   Supervising Provider    Answer:   [name] [[id number]]  . mupirocin ointment (BACTROBAN) 2 %    Sig: Apply small amount to affected area twice daily.    Dispense:  30 g    Refill:  2    Order Specific Question:   Supervising Provider    Answer:   [name] [[id number]]    Time spent: 22 Minutes      Dr [name] Internal medicine

## 2018-02-04 DIAGNOSIS — N8110 Cystocele, unspecified: Secondary | ICD-10-CM | POA: Insufficient documentation

## 2018-02-18 ENCOUNTER — Encounter: Payer: Medicare Other | Attending: Physician Assistant | Admitting: Physician Assistant
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
## 2018-02-20 NOTE — Progress Notes (Signed)
ANISHA, STARLIPER ([id number]) Visit Report for 02/18/2018 Abuse/Suicide Risk Screen Details Patient Name: [name], [name] Date of Service: 02/18/2018 10:30 AM Medical Record Number: [id number] Patient Account Number: [id number] Date of Birth/Sex: 1929-03-19 (84 y.o. F) Treating RN: Montey Hora Primary Care Jonella Redditt: Clayborn Bigness Other Clinician: Referring Raydon Chappuis: Clayborn Bigness Treating Lavaughn Bisig/Extender: Melburn Hake, HOYT Weeks in Treatment: 0 Abuse/Suicide Risk Screen Items Answer ABUSE/SUICIDE RISK SCREEN: Has anyone close to you tried to hurt or harm you recentlyo No Do you feel uncomfortable with anyone in your familyo No Has anyone forced you do things that you didnot want to doo No Do you have any thoughts of harming yourselfo No Patient displays signs or symptoms of abuse and/or neglect. No Electronic Signature(s) Signed: 02/18/2018 5:27:53 PM By: Montey Hora Entered By: Montey Hora on 02/18/2018 10:58:18 [name] ([id number]) -------------------------------------------------------------------------------- Activities of Daily Living Details Patient Name: [name] Date of Service: 02/18/2018 10:30 AM Medical Record Number: [id number] Patient Account Number: [id number] Date of Birth/Sex: Feb 04, 1929 (84 y.o. F) Treating RN: Montey Hora Primary Care Gotham Raden: Clayborn Bigness Other Clinician: Referring Nolah Krenzer: Clayborn Bigness Treating Lexie Morini/Extender: Melburn Hake, HOYT Weeks in Treatment: 0 Activities of Daily Living Items Answer Activities of Daily Living (Please select one for each item) Drive Automobile Completely Able Take Medications Completely Able Use Telephone Completely Able Care for Appearance Completely Able Use Toilet Completely Able Bath / Shower Completely Able Dress Self Completely Able Feed Self Completely Able Walk Completely Able Get In / Out Bed Completely Able Housework Completely Able Prepare Meals Completely [place] for Self Completely Able Electronic Signature(s) Signed: 02/18/2018 5:27:53 PM By: Montey Hora Entered By: Montey Hora on 02/18/2018 10:58:38 [name] ([id number]) -------------------------------------------------------------------------------- Education Assessment Details Patient Name: [name] Date of Service: 02/18/2018 10:30 AM Medical Record Number: [id number] Patient Account Number: [id number] Date of Birth/Sex: June 20, 1928 (84 y.o. F) Treating RN: Montey Hora Primary Care Felix Pratt: Clayborn Bigness Other Clinician: Referring Younis Mathey: Clayborn Bigness Treating Jacqulyn Barresi/Extender: Melburn Hake, HOYT Weeks in Treatment: 0 Primary Learner Assessed: Patient Learning Preferences/Education Level/Primary Language Learning Preference: Explanation, Demonstration Highest Education Level: College or Above Preferred Language: English Cognitive Barrier Assessment/Beliefs Language Barrier: No Translator Needed: No Memory Deficit: No Emotional Barrier: No Cultural/Religious Beliefs Affecting Medical Care: No Physical Barrier Assessment Impaired Vision: No Impaired Hearing: No Decreased Hand dexterity: No Knowledge/Comprehension Assessment Knowledge Level: Medium Comprehension Level: Medium Ability to understand written Medium instructions: Ability to understand verbal Medium instructions: Motivation Assessment Anxiety Level: Calm Cooperation: Cooperative Education Importance: Acknowledges Need Interest in Health Problems: Asks Questions Perception: Coherent Willingness to Engage in Self- Medium Management Activities: Readiness to Engage in Self- Medium Management Activities: Electronic Signature(s) Signed: 02/18/2018 5:27:53 PM By: Montey Hora Entered By: Montey Hora on 02/18/2018 10:59:05 [name] ([id number]) -------------------------------------------------------------------------------- Fall Risk Assessment  Details Patient Name: [name] Date of Service: 02/18/2018 10:30 AM Medical Record Number: [id number] Patient Account Number: [id number] Date of Birth/Sex: 23-Dec-1928 (84 y.o. F) Treating RN: Montey Hora Primary Care Absalom Aro: Clayborn Bigness Other Clinician: Referring Maverick Dieudonne: Clayborn Bigness Treating Aneira Cavitt/Extender: Melburn Hake, HOYT Weeks in Treatment: 0 Fall Risk Assessment Items Have you had 2 or more falls in the last 12 monthso 0 No Have you had any fall that resulted in injury in the last 12 monthso 0 No FALL RISK ASSESSMENT: History of falling - immediate or within 3 months 0 No Secondary diagnosis 0 No Ambulatory aid None/bed rest/wheelchair/nurse 0 No Crutches/cane/walker 0 No Furniture 0 No IV Access/Saline Lock 0 No Gait/Training  Normal/bed rest/immobile 0 No Weak 10 Yes Impaired 0 No Mental Status Oriented to own ability 0 Yes Electronic Signature(s) Signed: 02/18/2018 5:27:53 PM By: Montey Hora Entered By: Montey Hora on 02/18/2018 11:00:09 [name] ([id number]) -------------------------------------------------------------------------------- Nutrition Risk Assessment Details Patient Name: [name] Date of Service: 02/18/2018 10:30 AM Medical Record Number: [id number] Patient Account Number: [id number] Date of Birth/Sex: Jun 03, 1928 (84 y.o. F) Treating RN: Montey Hora Primary Care Sherrine Salberg: Clayborn Bigness Other Clinician: Referring Dyann Goodspeed: Clayborn Bigness Treating Lilian Fuhs/Extender: STONE III, HOYT Weeks in Treatment: 0 Height (in): Weight (lbs): Body Mass Index (BMI): Nutrition Risk Assessment Items NUTRITION RISK SCREEN: I have an illness or condition that made me change the kind and/or amount of 0 No food I eat I eat fewer than two meals per day 0 No I eat few fruits and vegetables, or milk products 0 No I have three or more drinks of beer, liquor or wine almost every day 0 No I have tooth or mouth problems that make it hard  for me to eat 0 No I don't always have enough money to buy the food I need 0 No I eat alone most of the time 0 No I take three or more different prescribed or over-the-counter drugs a day 1 Yes Without wanting to, I have lost or gained 10 pounds in the last six months 0 No I am not always physically able to shop, cook and/or feed myself 0 No Nutrition Protocols Good Risk Protocol 0 No interventions needed Moderate Risk Protocol Electronic Signature(s) Signed: 02/18/2018 5:27:53 PM By: Montey Hora Entered By: Montey Hora on 02/18/2018 11:00:17

## 2018-02-20 NOTE — Progress Notes (Signed)
DERISHA, FUNDERBURKE ([id number]) Visit Report for 02/18/2018 Allergy List Details Patient Name: JULIETA, ROGALSKI Date of Service: 02/18/2018 10:30 AM Medical Record Number: [id number] Patient Account Number: [id number] Date of Birth/Sex: 05/26/1929 (84 y.o. F) Treating RN: Montey Hora Primary Care Sadie Pickar: Clayborn Bigness Other Clinician: Referring Kathleene Bergemann: Clayborn Bigness Treating Ishmel Acevedo/Extender: STONE III, HOYT Weeks in Treatment: 0 Allergies Active Allergies penicillin bacitracin Allergy Notes Electronic Signature(s) Signed: 02/18/2018 5:27:53 PM By: Montey Hora Entered By: Montey Hora on 02/18/2018 10:52:59 [name] ([id number]) -------------------------------------------------------------------------------- Arrival Information Details Patient Name: [name] Date of Service: 02/18/2018 10:30 AM Medical Record Number: [id number] Patient Account Number: [id number] Date of Birth/Sex: 02-07-1929 (84 y.o. F) Treating RN: Roger Shelter Primary Care Nathanael Krist: Clayborn Bigness Other Clinician: Referring Nykia Turko: Clayborn Bigness Treating Jeree Delcid/Extender: Melburn Hake, HOYT Weeks in Treatment: 0 Visit Information Patient Arrived: Ambulatory Arrival Time: 10:48 Accompanied By: self Transfer Assistance: None Patient Identification Verified: Yes Secondary Verification Process Completed: Yes History Since Last Visit Added or deleted any medications: No Any new allergies or adverse reactions: No Had a fall or experienced change in activities of daily living that may affect risk of falls: No Signs or symptoms of abuse/neglect since last visito No Hospitalized since last visit: No Implantable device outside of the clinic excluding cellular tissue based products placed in the center since last visit: No Pain Present Now: Yes Electronic Signature(s) Signed: 02/18/2018 11:43:05 AM By: Lorine Bears RCP, RRT, CHT Entered By: Lorine Bears on  02/18/2018 10:49:33 Karlen, [name] ([id number]) -------------------------------------------------------------------------------- Pain Assessment Details Patient Name: [name] Date of Service: 02/18/2018 10:30 AM Medical Record Number: [id number] Patient Account Number: [id number] Date of Birth/Sex: 03-14-1929 (84 y.o. F) Treating RN: Roger Shelter Primary Care Pilar Westergaard: Clayborn Bigness Other Clinician: Referring Kairi Harshbarger: Clayborn Bigness Treating Jacobus Colvin/Extender: Melburn Hake, HOYT Weeks in Treatment: 0 Active Problems Location of Pain Severity and Description of Pain Patient Has Paino Yes Site Locations Rate the pain. Current Pain Level: 2 Pain Management and Medication Current Pain Management: Electronic Signature(s) Signed: 02/18/2018 11:43:05 AM By: Lorine Bears RCP, RRT, CHT Signed: 02/19/2018 10:33:34 AM By: Roger Shelter Entered By: Lorine Bears on 02/18/2018 10:51:10 GREGORY, DOWE ([id number]) -------------------------------------------------------------------------------- Vitals Details Patient Name: [name] Date of Service: 02/18/2018 10:30 AM Medical Record Number: [id number] Patient Account Number: [id number] Date of Birth/Sex: [birth year] (84 y.o. F) Treating RN: Roger Shelter Primary Care Cashae Weich: Clayborn Bigness Other Clinician: Referring Lambros Cerro: Clayborn Bigness Treating Mateen Franssen/Extender: Melburn Hake, HOYT Weeks in Treatment: 0 Vital Signs Time Taken: 10:50 Temperature (F): 98.0 Pulse (bpm): 74 Respiratory Rate (breaths/min): 16 Blood Pressure (mmHg): 192/69 Reference Range: 80 - 120 mg / dl Electronic Signature(s) Signed: 02/18/2018 11:43:05 AM By: Lorine Bears RCP, RRT, CHT Entered By: Lorine Bears on 02/18/2018 10:52:23

## 2018-02-22 ENCOUNTER — Ambulatory Visit: Payer: Medicare Other | Admitting: Nurse Practitioner

## 2018-02-22 ENCOUNTER — Encounter: Payer: Self-pay | Admitting: Nurse Practitioner

## 2018-02-22 VITALS — BP 178/92 | HR 77 | Resp 16 | Ht 60.0 in | Wt 114.0 lb

## 2018-02-22 DIAGNOSIS — L03119 Cellulitis of unspecified part of limb: Secondary | ICD-10-CM

## 2018-02-22 DIAGNOSIS — N8110 Cystocele, unspecified: Secondary | ICD-10-CM | POA: Diagnosis not present

## 2018-02-22 DIAGNOSIS — I10 Essential (primary) hypertension: Secondary | ICD-10-CM | POA: Diagnosis not present

## 2018-02-22 DIAGNOSIS — L089 Local infection of the skin and subcutaneous tissue, unspecified: Secondary | ICD-10-CM

## 2018-02-22 MED ORDER — DOXYCYCLINE HYCLATE 100 MG PO TABS: 100.0000 mg | ORAL_TABLET | Freq: Two times a day (BID) | ORAL | 0 refills | Status: DC

## 2018-02-22 NOTE — Progress Notes (Signed)
[facility] [place], [place] [address]  Internal MEDICINE  Office Visit Note  Patient Name: [name]  [id number]  [id number]  Date of Service: 02/22/2018  Chief Complaint  Patient presents with  . Medical Management of Chronic Issues    4wk follow up  . Hypertension  . Cellulitis    Patient arrives to the follow up appointment for uncontrolled hypertension. Her blood pressure is still very high. Patient admits that she does not take her full dose of labetalol as prescribed because it makes her "sleepy". Patient has occasional headaches and takes tylenol for it. Patient is educated and encouraged to take her medication as prescribed. She also educated about consequences and high risk for stroke for uncontrolled hypertension. Patient verbalizes that she understands the risks.  Patient was just seen at  Dr. Alvera Singh office due to the pain in her left eye. She states that the right eye is itchy and red, but it is the left one that hurts. Dr. Vickki Muff prescribed the medication that patient was planning to pick up from the pharmacy after this appointment.  Patient also states that she did not make appointment with ob/gyn office due to her prolapse bladder. She continues to experience occasional incontinence episodes. Patient encouraged to make this appointment as soon as possible. Patient reports that she went to wound clinic but nothing was offered to the patient there. At the time of the arrival her open wounds looked better with less drainage and swelling due to oral and topical antibiotics treatment. Was felt there was no need for wound care at that time. However, patient continues to have multiple discolores scabs bilaterally on her lower extremities and she was referred to dermatologist for further evaluation.       Current Medication: Outpatient Encounter Medications as of 02/22/2018  Medication Sig  . amLODipine (NORVASC) 5 MG tablet TAKE 1 TABLET BY  MOUTH AT BEDTIME FOR HYPERTENSION  . Ascorbic Acid (VITAMIN C) 1000 MG tablet Take 1,000 mg by mouth daily.  Marland Kitchen aspirin EC 81 MG tablet Take 81 mg by mouth daily.  . Biotin 5000 MCG TABS Take by mouth daily.  . calcium-vitamin D (OSCAL WITH D) 500-200 MG-UNIT tablet Take 1 tablet by mouth daily with breakfast.  . cholecalciferol (VITAMIN D) 1000 units tablet Take 1,000 Units by mouth daily.  . clobetasol cream (TEMOVATE) 0.05 % Apply twice a day to legs  . clonazePAM (KLONOPIN) 1 MG tablet Take 1 tablet (1 mg total) by mouth at bedtime as needed for anxiety.  . cloNIDine (CATAPRES) 0.1 MG tablet Take 1 tablet by mouth 2 (two) times daily.  Marland Kitchen doxycycline (VIBRA-TABS) 100 MG tablet Take 1 tablet (100 mg total) by mouth 2 (two) times daily.  Marland Kitchen estradiol (ESTRACE) 0.5 MG tablet TAKE 1 TABLET(S) BY MOUTH DAILY  . HYDROcodone-acetaminophen (NORCO/VICODIN) 5-325 MG tablet Take 1 tablet by mouth every 6 (six) hours as needed for moderate pain.  Marland Kitchen irbesartan (AVAPRO) 150 MG tablet Take 150 mg by mouth 2 (two) times daily.  Marland Kitchen labetalol (NORMODYNE) 100 MG tablet Take 2 tablets (200 mg total) by mouth 2 (two) times daily.  . montelukast (SINGULAIR) 10 MG tablet Take 1 tablet by mouth daily.  . Multiple Vitamin (MULTIVITAMIN) tablet Take 1 tablet by mouth daily.  . mupirocin ointment (BACTROBAN) 2 % Apply small amount to affected area twice daily.  . phenazopyridine (PYRIDIUM) 200 MG tablet Take one tab po bid for baldder prn  . PROAIR HFA 108 (  90 Base) MCG/ACT inhaler Inhale 2 puffs into the lungs every 4 (four) hours as needed.  . vitamin B-12 (CYANOCOBALAMIN) 1000 MCG tablet Take 1,000 mcg by mouth daily.  Marland Kitchen VITAMIN E PO Take by mouth daily.  . [DISCONTINUED] doxycycline (VIBRA-TABS) 100 MG tablet Take 1 tablet (100 mg total) by mouth 2 (two) times daily.   No facility-administered encounter medications on file as of 02/22/2018.     Surgical History: Past Surgical History:  Procedure Laterality Date   . ABDOMINAL HYSTERECTOMY    . COLONOSCOPY    . RENAL ARTERY STENT    . TONSILLECTOMY      Medical History: Past Medical History:  Diagnosis Date  . Asthma    in past  . Heart murmur    Pt states she has Heart Murmur  . Hypertension   . Lower leg injury 04/24/2016   08/06/17- pt reports legs still not completely healed  . Renal insufficiency    10%-Right, 90%-Left (per pt)  . Skin cancer     Family History: Family History  Problem Relation Age of Onset  . Hypertension Mother     Social History   Socioeconomic History  . Marital status: Widowed    Spouse name: Not on file  . Number of children: Not on file  . Years of education: Not on file  . Highest education level: Not on file  Occupational History  . Not on file  Social Needs  . Financial resource strain: Not on file  . Food insecurity:    Worry: Not on file    Inability: Not on file  . Transportation needs:    Medical: Not on file    Non-medical: Not on file  Tobacco Use  . Smoking status: Never Smoker  . Smokeless tobacco: Never Used  Substance and Sexual Activity  . Alcohol use: No  . Drug use: No  . Sexual activity: Not on file  Lifestyle  . Physical activity:    Days per week: Not on file    Minutes per session: Not on file  . Stress: Not on file  Relationships  . Social connections:    Talks on phone: Not on file    Gets together: Not on file    Attends religious service: Not on file    Active member of club or organization: Not on file    Attends meetings of clubs or organizations: Not on file    Relationship status: Not on file  . Intimate partner violence:    Fear of current or ex partner: Not on file    Emotionally abused: Not on file    Physically abused: Not on file    Forced sexual activity: Not on file  Other Topics Concern  . Not on file  Social History Narrative  . Not on file      Review of Systems  Constitutional: Negative for activity change, appetite change, fever  and unexpected weight change.  HENT: Negative for ear pain, postnasal drip, rhinorrhea, sinus pressure, sinus pain, sore throat and voice change.   Eyes: Positive for pain, discharge, redness and itching.       Patient saw ophthalmologist two hours prior to this appointment  Respiratory: Negative for apnea, cough, chest tightness, shortness of breath and wheezing.   Cardiovascular: Negative for chest pain, palpitations and leg swelling.       Varicose veins  Gastrointestinal: Negative for abdominal distention, abdominal pain, anal bleeding, blood in stool, constipation, nausea, rectal pain and  vomiting.       One soft stool today, patient takes stool softeners as needed  Endocrine: Negative for cold intolerance, heat intolerance, polydipsia, polyphagia and polyuria.  Genitourinary: Positive for dysuria, frequency and urgency. Negative for flank pain, hematuria and pelvic pain.  Musculoskeletal: Positive for arthralgias. Negative for joint swelling and myalgias.  Skin: Positive for color change, rash and wound.       Skin wounds in multiple stages of healing on bilateral lower legs. There is some improvement since her last visit.   Allergic/Immunologic: Negative for environmental allergies.  Neurological: Positive for dizziness and headaches. Negative for speech difficulty, light-headedness and numbness.  Hematological: Negative for adenopathy.  Psychiatric/Behavioral: Negative for agitation, behavioral problems and confusion. The patient is nervous/anxious.     Today's Vitals   02/22/18 1519 02/22/18 1628  BP: (!) 208/90 (!) 178/92  Pulse: 77   Resp: 16   SpO2: 95%   Weight: 114 lb (51.7 kg)   Height: 5' (1.524 m)     Physical Exam  Constitutional: She is oriented to person, place, and time. She appears well-developed.  HENT:  Head: Normocephalic.  Nose: Nose normal.  Eyes: Pupils are equal, round, and reactive to light. EOM are normal. Right eye exhibits discharge. Left eye  exhibits discharge. No scleral icterus.  Neck: Normal range of motion. Neck supple. No JVD present. Carotid bruit is not present.  Cardiovascular: Regular rhythm and intact distal pulses.  Murmur heard. Pulmonary/Chest: Effort normal and breath sounds normal. No stridor. No respiratory distress. She has no wheezes. She has no rales. She exhibits no tenderness.  Abdominal: Soft. Bowel sounds are normal. She exhibits no distension. There is no tenderness. There is no guarding.  Musculoskeletal:  Patient is weak, usually uses the cane but not during this visit  Neurological: She is alert and oriented to person, place, and time.  Skin: Skin is warm. Capillary refill takes 2 to 3 seconds. Rash noted.  Multiple white crusted scabs bilaterally on lower extremities, dry , fragile skin with discoloration due to PVD and previous wound care treatment on both legs. New ulcerative spot on her right calf. Patient encouraged to continue to use topical prescribed antibiotics.    Psychiatric: Her speech is normal and behavior is normal. Judgment and thought content normal. Her mood appears anxious. Cognition and memory are normal.  Nursing note and vitals reviewed.  Assessment/Plan: 1. Recurrent cellulitis of lower leg Repeat treatment with doxycycline 100mg  twice daily for 14 days. Refer to dermatology for further treatment - Ambulatory referral to Dermatology - doxycycline (VIBRA-TABS) 100 MG tablet; Take 1 tablet (100 mg total) by mouth 2 (two) times daily.  Dispense: 28 tablet; Refill: 0  2. Local infection of skin and subcutaneous tissue Multiple discolored scabs bilaterally on lower extrimities, requires further evaluation - Ambulatory referral to Dermatology  3. Hypertension, poor control Patient does not take her bloop pressure as prescribed. Patient is educated about risks . Patient encouraged to keep a log of her BP at home and bring it to the office for the nexr follow up.  4. Bladder prolapse,  female, acquired Patient encouraged to make the appointment with her ob/gyn provider to resolve the issue  General Counseling: Rosita verbalizes understanding of the findings of todays visit and agrees with plan of treatment. I have discussed any further diagnostic evaluation that may be needed or ordered today. We also reviewed her medications today. she has been encouraged to call the office with any questions or concerns  that should arise related to todays visit.  Counseling: Adherence of Medical Therapy: The patient understands that it is the responsibility of the patient to complete all prescribed medications, all recommended testing, including but not limited to, laboratory studies and imaging. The patient further understands the need to keep all scheduled follow-up visits and to inform the office immediately of any changes in their medical condition. The patient understands that the success of treatment in large part depends on the patient's willingness to complete the therapeutic regimen and to work in partnership with the designated health-care providers.  This patient was seen by Leretha Pol FNP Collaboration with Dr [name] as a part of collaborative care agreement  Orders Placed This Encounter  Procedures  . Ambulatory referral to Dermatology    Meds ordered this encounter  Medications  . doxycycline (VIBRA-TABS) 100 MG tablet    Sig: Take 1 tablet (100 mg total) by mouth 2 (two) times daily.    Dispense:  28 tablet    Refill:  0    Order Specific Question:   Supervising Provider    Answer:   [name] [[id number]]    Time spent: 31 Minutes      Dr [name] Internal medicine

## 2018-03-30 ENCOUNTER — Other Ambulatory Visit: Payer: Self-pay | Admitting: Internal Medicine

## 2018-04-02 ENCOUNTER — Telehealth: Payer: Self-pay

## 2018-04-02 NOTE — Telephone Encounter (Signed)
Pt called wanting a refill in her Clonidine, she stated she would be taking her last pill tonight.

## 2018-04-03 ENCOUNTER — Other Ambulatory Visit: Payer: Self-pay

## 2018-04-03 MED ORDER — CLONIDINE HCL 0.1 MG PO TABS: 0.1000 mg | ORAL_TABLET | Freq: Two times a day (BID) | ORAL | 5 refills | Status: DC

## 2018-04-29 ENCOUNTER — Ambulatory Visit: Payer: Medicare Other | Admitting: Nurse Practitioner

## 2018-04-29 ENCOUNTER — Encounter: Payer: Self-pay | Admitting: Nurse Practitioner

## 2018-04-29 VITALS — BP 189/86 | HR 73 | Resp 16 | Ht 60.0 in | Wt 115.4 lb

## 2018-04-29 DIAGNOSIS — D0470 Carcinoma in situ of skin of unspecified lower limb, including hip: Secondary | ICD-10-CM

## 2018-04-29 DIAGNOSIS — I10 Essential (primary) hypertension: Secondary | ICD-10-CM | POA: Diagnosis not present

## 2018-04-29 DIAGNOSIS — F411 Generalized anxiety disorder: Secondary | ICD-10-CM | POA: Diagnosis not present

## 2018-04-29 MED ORDER — CLONAZEPAM 1 MG PO TABS: 1.0000 mg | ORAL_TABLET | Freq: Every evening | ORAL | 2 refills | Status: DC | PRN

## 2018-04-29 NOTE — Progress Notes (Signed)
[facility] [place], [place] [address]  Internal MEDICINE  Office Visit Note  Patient Name: [name]  [id number]  [id number]  Date of Service: 04/29/2018  Chief Complaint  Patient presents with  . Medical Management of Chronic Issues    2 month follow up  . Hypertension    Patient arrives to the clinic for hypertension follow up. Patient states she took her labetalol as prescribed, but she has been taking only half dose of amlodipine. Patient educated about Amlodipine purpose and importance of taking it as prescribed. Patient verbalizes understanding and going to try to do it. Her blood pressure today is still elevated 189/86. Patient denies any headache, vision change. Patient has brought a home log of her BPs for October/November months. Her SBPs stay in the range of 150-160, DBPs in 70-80s with HR in 70s. She reports that she had one SBP in 110s and she felt very bad, lightheaded and dizzy. Patient advised to continue to check her blood pressures at home and bring to her next appointment.       Current Medication: Outpatient Encounter Medications as of 04/29/2018  Medication Sig  . amLODipine (NORVASC) 5 MG tablet TAKE 1 TABLET BY MOUTH AT BEDTIME FOR HYPERTENSION  . Ascorbic Acid (VITAMIN C) 1000 MG tablet Take 1,000 mg by mouth daily.  Marland Kitchen aspirin EC 81 MG tablet Take 81 mg by mouth daily.  . Biotin 5000 MCG TABS Take by mouth daily.  . calcium-vitamin D (OSCAL WITH D) 500-200 MG-UNIT tablet Take 1 tablet by mouth daily with breakfast.  . cholecalciferol (VITAMIN D) 1000 units tablet Take 1,000 Units by mouth daily.  . clobetasol cream (TEMOVATE) 0.05 % Apply twice a day to legs  . clonazePAM (KLONOPIN) 1 MG tablet Take 1 tablet (1 mg total) by mouth at bedtime as needed for anxiety.  . cloNIDine (CATAPRES) 0.1 MG tablet Take 1 tablet (0.1 mg total) by mouth 2 (two) times daily.  Marland Kitchen doxycycline (VIBRA-TABS) 100 MG tablet Take 1 tablet (100 mg  total) by mouth 2 (two) times daily.  Marland Kitchen estradiol (ESTRACE) 0.5 MG tablet TAKE 1 TABLET(S) BY MOUTH DAILY  . HYDROcodone-acetaminophen (NORCO/VICODIN) 5-325 MG tablet Take 1 tablet by mouth every 6 (six) hours as needed for moderate pain.  Marland Kitchen irbesartan (AVAPRO) 150 MG tablet Take 150 mg by mouth 2 (two) times daily.  Marland Kitchen labetalol (NORMODYNE) 100 MG tablet Take 2 tablets (200 mg total) by mouth 2 (two) times daily.  . montelukast (SINGULAIR) 10 MG tablet TAKE 1 TABLET BY MOUTH EVERY DAY  . Multiple Vitamin (MULTIVITAMIN) tablet Take 1 tablet by mouth daily.  . mupirocin ointment (BACTROBAN) 2 % Apply small amount to affected area twice daily.  . phenazopyridine (PYRIDIUM) 200 MG tablet Take one tab po bid for baldder prn  . PROAIR HFA 108 (90 Base) MCG/ACT inhaler Inhale 2 puffs into the lungs every 4 (four) hours as needed.  . vitamin B-12 (CYANOCOBALAMIN) 1000 MCG tablet Take 1,000 mcg by mouth daily.  Marland Kitchen VITAMIN E PO Take by mouth daily.  . [DISCONTINUED] clonazePAM (KLONOPIN) 1 MG tablet Take 1 tablet (1 mg total) by mouth at bedtime as needed for anxiety.   No facility-administered encounter medications on file as of 04/29/2018.     Surgical History: Past Surgical History:  Procedure Laterality Date  . ABDOMINAL HYSTERECTOMY    . COLONOSCOPY    . RENAL ARTERY STENT    . TONSILLECTOMY      Medical  History: Past Medical History:  Diagnosis Date  . Asthma    in past  . Heart murmur    Pt states she has Heart Murmur  . Hypertension   . Lower leg injury 04/24/2016   08/06/17- pt reports legs still not completely healed  . Renal insufficiency    10%-Right, 90%-Left (per pt)  . Skin cancer     Family History: Family History  Problem Relation Age of Onset  . Hypertension Mother     Social History   Socioeconomic History  . Marital status: Widowed    Spouse name: Not on file  . Number of children: Not on file  . Years of education: Not on file  . Highest education level:  Not on file  Occupational History  . Not on file  Social Needs  . Financial resource strain: Not on file  . Food insecurity:    Worry: Not on file    Inability: Not on file  . Transportation needs:    Medical: Not on file    Non-medical: Not on file  Tobacco Use  . Smoking status: Never Smoker  . Smokeless tobacco: Never Used  Substance and Sexual Activity  . Alcohol use: No  . Drug use: No  . Sexual activity: Not on file  Lifestyle  . Physical activity:    Days per week: Not on file    Minutes per session: Not on file  . Stress: Not on file  Relationships  . Social connections:    Talks on phone: Not on file    Gets together: Not on file    Attends religious service: Not on file    Active member of club or organization: Not on file    Attends meetings of clubs or organizations: Not on file    Relationship status: Not on file  . Intimate partner violence:    Fear of current or ex partner: Not on file    Emotionally abused: Not on file    Physically abused: Not on file    Forced sexual activity: Not on file  Other Topics Concern  . Not on file  Social History Narrative  . Not on file      Review of Systems  Constitutional: Negative for appetite change, chills and fever.  HENT: Negative for congestion, ear pain, facial swelling and hearing loss.   Eyes: Positive for discharge and redness.       Pt sees opthalmologist  Respiratory: Negative for cough, choking, chest tightness, shortness of breath and wheezing.   Cardiovascular: Negative for chest pain, palpitations and leg swelling.       Very elevated blood pressure.   Gastrointestinal: Negative for abdominal distention, abdominal pain, blood in stool, constipation, diarrhea and vomiting.  Endocrine: Negative for polydipsia and polyuria.  Genitourinary: Positive for frequency and urgency. Negative for flank pain.       Prolapse bladder  Musculoskeletal: Positive for arthralgias.  Skin: Positive for rash.        Lower extremities scabs and discoloration. Pt sees dermatologist on 04/30/18   Neurological: Negative for dizziness, syncope, facial asymmetry, speech difficulty and headaches.  Hematological: Negative for adenopathy.  Psychiatric/Behavioral: The patient is nervous/anxious.    Today's Vitals   04/29/18 1510  BP: (!) 189/86  Pulse: 73  Resp: 16  SpO2: 99%  Weight: 115 lb 6.4 oz (52.3 kg)  Height: 5' (1.524 m)    Physical Exam  Constitutional: She is oriented to person, place, and time. She appears  well-developed and well-nourished.  HENT:  Head: Normocephalic and atraumatic.  Eyes: Pupils are equal, round, and reactive to light. EOM are normal. Right eye exhibits discharge. Scleral icterus is present.  Neck: Normal range of motion. Neck supple. No tracheal deviation present. No thyromegaly present.  Cardiovascular: Normal rate, regular rhythm and intact distal pulses. Exam reveals no gallop and no friction rub.  Murmur heard. Pulmonary/Chest: Effort normal and breath sounds normal. No respiratory distress. She has no wheezes. She has no rales. She exhibits no tenderness.  Abdominal: Soft. Bowel sounds are normal. She exhibits no distension. There is no tenderness. There is no guarding.  Musculoskeletal: She exhibits no edema, tenderness or deformity.  Lymphadenopathy:    She has no cervical adenopathy.  Neurological: She is alert and oriented to person, place, and time.  Skin: Skin is warm and dry.  Evidence of chronic vasculitis inn both lower legs. Skin Is open in many areas of both lower legs and there is moderate amount of serosanguinous fluid drainage, there is slight odor present.    Psychiatric: She has a normal mood and affect. Her behavior is normal. Judgment and thought content normal.  Nursing note and vitals reviewed.   Assessment/Plan:  1. Hypertension, poor control Uncontrolled Hypertension. Pt educated about her blood pressure medications and how to take them  correctly. No dosing changes today. Patient to log blood pressures and bring with her to next visit.   2. GAD (generalized anxiety disorder) Continue current treatment, well controlled - clonazePAM (KLONOPIN) 1 MG tablet; Take 1 tablet (1 mg total) by mouth at bedtime as needed for anxiety.  Dispense: 30 tablet; Refill: 2  3. 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   General Counseling: Christal verbalizes understanding of the findings of todays visit and agrees with plan of treatment. I have discussed any further diagnostic evaluation that may be needed or ordered today. We also reviewed her medications today. she has been encouraged to call the office with any questions or concerns that should arise related to todays visit.  Hypertension Counseling:   The following hypertensive lifestyle modification were recommended and discussed:  1. Limiting alcohol intake to less than 1 oz/day of ethanol:(24 oz of beer or 8 oz of wine or 2 oz of 100-proof whiskey). 2. Take baby ASA 81 mg daily. 3. Importance of regular aerobic exercise and losing weight. 4. Reduce dietary saturated fat and cholesterol intake for overall cardiovascular health. 5. Maintaining adequate dietary potassium, calcium, and magnesium intake. 6. Regular monitoring of the blood pressure. 7. Reduce sodium intake to less than 100 mmol/day (less than 2.3 gm of sodium or less than 6 gm of sodium choride)   This patient was seen by Barton with Dr [name] as a part of collaborative care agreement  Meds ordered this encounter  Medications  . clonazePAM (KLONOPIN) 1 MG tablet    Sig: Take 1 tablet (1 mg total) by mouth at bedtime as needed for anxiety.    Dispense:  30 tablet    Refill:  2    Order Specific Question:   Supervising Provider    Answer:   [name] [[id number]]    Time spent: 79 Minutes      Dr  [name] Internal medicine

## 2018-05-01 DIAGNOSIS — D0470 Carcinoma in situ of skin of unspecified lower limb, including hip: Secondary | ICD-10-CM | POA: Insufficient documentation

## 2018-05-07 ENCOUNTER — Other Ambulatory Visit: Payer: Self-pay

## 2018-05-07 NOTE — Telephone Encounter (Signed)
Pt daughter walk in to office that she seeing bugs everywhere she having hallucination as per heather pt daughter advised we don't change any med also she need to been seen or go to Er also pt daughter says that her also went to see dermatology so advised her to check with them may be they gave her any med and if not she need appt or go to ER

## 2018-05-10 ENCOUNTER — Telehealth: Payer: Self-pay

## 2018-05-10 ENCOUNTER — Other Ambulatory Visit: Payer: Self-pay | Admitting: Nurse Practitioner

## 2018-05-10 DIAGNOSIS — L03119 Cellulitis of unspecified part of limb: Secondary | ICD-10-CM

## 2018-05-10 MED ORDER — DOXYCYCLINE HYCLATE 100 MG PO TABS: 100.0000 mg | ORAL_TABLET | Freq: Two times a day (BID) | ORAL | 0 refills | Status: DC

## 2018-05-10 NOTE — Telephone Encounter (Signed)
Patient with complaint of bladder pain/uti. Also having redness and swelling of skin around lower legs and ankles. Sent prescription for doxycycline 100mg  twice daily to her pharmacy. This should be taken for next 14 days. She should use OTC AZO as needed to help with bladder pain.

## 2018-05-10 NOTE — Progress Notes (Signed)
Patient with complaint of bladder pain/uti. Also having redness and swelling of skin around lower legs and ankles. Sent prescription for doxycycline 100mg  twice daily to her pharmacy. This should be taken for next 14 days. She should use OTC AZO as needed to help with bladder pain.

## 2018-05-10 NOTE — Telephone Encounter (Signed)
Informed pt rx sent to pharmacy per [facility]

## 2018-05-17 ENCOUNTER — Ambulatory Visit: Payer: Medicare Other | Admitting: Adult Health

## 2018-05-17 ENCOUNTER — Encounter: Payer: Self-pay | Admitting: Adult Health

## 2018-05-17 VITALS — BP 185/82 | HR 81 | Resp 16 | Ht 60.0 in | Wt 114.2 lb

## 2018-05-17 DIAGNOSIS — I10 Essential (primary) hypertension: Secondary | ICD-10-CM

## 2018-05-17 DIAGNOSIS — R443 Hallucinations, unspecified: Secondary | ICD-10-CM

## 2018-05-17 DIAGNOSIS — F411 Generalized anxiety disorder: Secondary | ICD-10-CM

## 2018-05-17 DIAGNOSIS — R300 Dysuria: Secondary | ICD-10-CM | POA: Diagnosis not present

## 2018-05-17 LAB — POCT URINALYSIS DIPSTICK
BILIRUBIN UA: NEGATIVE
GLUCOSE UA: NEGATIVE
KETONES UA: NEGATIVE
LEUKOCYTES UA: NEGATIVE
Nitrite, UA: NEGATIVE
Protein, UA: NEGATIVE
RBC UA: NEGATIVE
SPEC GRAV UA: 1.01 (ref 1.010–1.025)
Urobilinogen, UA: 0.2 E.U./dL
pH, UA: 5 (ref 5.0–8.0)

## 2018-05-17 NOTE — Progress Notes (Signed)
[facility] [place], [place] [address]  Internal MEDICINE  Office Visit Note  Patient Name: [name]  [id number]  [id number]  Date of Service: 05/21/2018  Chief Complaint  Patient presents with  . Hallucinations     HPI Pt is here for a sick visit.  Her daughter is with her in the exam room.  She reports that for a few weeks she has had an infestation of ants in the floor and on the walls of her home.  It was not until recently she realized that these aunts were not really there.  The patient is seeing these aunts and no one else can see them.  Daughter confirms there are no aunts in the patient's home even though the patient has used cans of raid pest control inside the home she continues to see them and no one else can.  Patient denies any recent medication changes and has been overall doing well.  She does have a significant history of skin cancers and the concern is that maybe she has a tumor in her brain.     Current Medication:  Outpatient Encounter Medications as of 05/17/2018  Medication Sig  . amLODipine (NORVASC) 5 MG tablet TAKE 1 TABLET BY MOUTH AT BEDTIME FOR HYPERTENSION  . Ascorbic Acid (VITAMIN C) 1000 MG tablet Take 1,000 mg by mouth daily.  Marland Kitchen aspirin EC 81 MG tablet Take 81 mg by mouth daily.  . Biotin 5000 MCG TABS Take by mouth daily.  . calcium-vitamin D (OSCAL WITH D) 500-200 MG-UNIT tablet Take 1 tablet by mouth daily with breakfast.  . cholecalciferol (VITAMIN D) 1000 units tablet Take 1,000 Units by mouth daily.  . clobetasol cream (TEMOVATE) 0.05 % Apply twice a day to legs  . clonazePAM (KLONOPIN) 1 MG tablet Take 1 tablet (1 mg total) by mouth at bedtime as needed for anxiety.  . cloNIDine (CATAPRES) 0.1 MG tablet Take 1 tablet (0.1 mg total) by mouth 2 (two) times daily.  Marland Kitchen doxycycline (VIBRA-TABS) 100 MG tablet Take 1 tablet (100 mg total) by mouth 2 (two) times daily.  Marland Kitchen estradiol (ESTRACE) 0.5 MG tablet TAKE 1  TABLET(S) BY MOUTH DAILY  . HYDROcodone-acetaminophen (NORCO/VICODIN) 5-325 MG tablet Take 1 tablet by mouth every 6 (six) hours as needed for moderate pain.  Marland Kitchen irbesartan (AVAPRO) 150 MG tablet Take 150 mg by mouth 2 (two) times daily.  Marland Kitchen labetalol (NORMODYNE) 100 MG tablet Take 2 tablets (200 mg total) by mouth 2 (two) times daily.  . montelukast (SINGULAIR) 10 MG tablet TAKE 1 TABLET BY MOUTH EVERY DAY  . Multiple Vitamin (MULTIVITAMIN) tablet Take 1 tablet by mouth daily.  . mupirocin ointment (BACTROBAN) 2 % Apply small amount to affected area twice daily.  . phenazopyridine (PYRIDIUM) 200 MG tablet Take one tab po bid for baldder prn  . PROAIR HFA 108 (90 Base) MCG/ACT inhaler Inhale 2 puffs into the lungs every 4 (four) hours as needed.  . vitamin B-12 (CYANOCOBALAMIN) 1000 MCG tablet Take 1,000 mcg by mouth daily.  Marland Kitchen VITAMIN E PO Take by mouth daily.   No facility-administered encounter medications on file as of 05/17/2018.       Medical History: Past Medical History:  Diagnosis Date  . Asthma    in past  . Heart murmur    Pt states she has Heart Murmur  . Hypertension   . Lower leg injury 04/24/2016   08/06/17- pt reports legs still not completely healed  .  Renal insufficiency    10%-Right, 90%-Left (per pt)  . Skin cancer      Vital Signs: BP (!) 185/82   Pulse 81   Resp 16   Ht 5' (1.524 m)   Wt 114 lb 3.2 oz (51.8 kg)   SpO2 98%   BMI 22.30 kg/m    Review of Systems  Constitutional: Negative for chills, fatigue and unexpected weight change.  HENT: Negative for congestion, rhinorrhea, sneezing and sore throat.   Eyes: Negative for photophobia, pain and redness.  Respiratory: Negative for cough, chest tightness and shortness of breath.   Cardiovascular: Negative for chest pain and palpitations.  Gastrointestinal: Negative for abdominal pain, constipation, diarrhea, nausea and vomiting.  Endocrine: Negative.   Genitourinary: Negative for dysuria and  frequency.  Musculoskeletal: Negative for arthralgias, back pain, joint swelling and neck pain.  Skin: Negative for rash.  Allergic/Immunologic: Negative.   Neurological: Negative for tremors and numbness.  Hematological: Negative for adenopathy. Does not bruise/bleed easily.  Psychiatric/Behavioral: Positive for hallucinations. Negative for behavioral problems and sleep disturbance. The patient is not nervous/anxious.     Physical Exam Vitals signs and nursing note reviewed.  Constitutional:      General: She is not in acute distress.    Appearance: She is well-developed. She is not diaphoretic.  HENT:     Head: Normocephalic and atraumatic.     Mouth/Throat:     Pharynx: No oropharyngeal exudate.  Eyes:     Pupils: Pupils are equal, round, and reactive to light.  Neck:     Musculoskeletal: Normal range of motion and neck supple.     Thyroid: No thyromegaly.     Vascular: No JVD.     Trachea: No tracheal deviation.  Cardiovascular:     Rate and Rhythm: Normal rate and regular rhythm.     Heart sounds: Normal heart sounds. No murmur. No friction rub. No gallop.   Pulmonary:     Effort: Pulmonary effort is normal. No respiratory distress.     Breath sounds: Normal breath sounds. No wheezing or rales.  Chest:     Chest wall: No tenderness.  Abdominal:     Palpations: Abdomen is soft.     Tenderness: There is no abdominal tenderness. There is no guarding.  Musculoskeletal: Normal range of motion.  Lymphadenopathy:     Cervical: No cervical adenopathy.  Skin:    General: Skin is warm and dry.  Neurological:     Mental Status: She is alert and oriented to person, place, and time.     Cranial Nerves: No cranial nerve deficit.  Psychiatric:        Behavior: Behavior normal.        Thought Content: Thought content normal.        Judgment: Judgment normal.    Assessment/Plan: 1. Hallucinations Patient's urine dip was negative at this visit.  She does not have a urinary  tract infection that could be causing this hallucinations.  A CT head without contrast is ordered at a future date to assess for metastasis to the brain.  Will follow with patient once CT results are available.   - CT Head Wo Contrast; Future  2. Hypertension, poor control Patient's blood pressure is slightly elevated at today's visit.  She has a history of poor control of her high blood pressure. We will continue to monitor at future visits.  3. GAD (generalized anxiety disorder) Stable, patient reports no anxiety at this time.  She would like to  stop seeing these aunts however.  She does state she knows they are not really there and that no one else can see them.  4. Dysuria Urine dip clear at this visit no UTI, pyelonephritis, or cystitis expected.  General Counseling: Sieanna verbalizes understanding of the findings of todays visit and agrees with plan of treatment. I have discussed any further diagnostic evaluation that may be needed or ordered today. We also reviewed her medications today. she has been encouraged to call the office with any questions or concerns that should arise related to todays visit.   Orders Placed This Encounter  Procedures  . CT Head Wo Contrast  . POCT Urinalysis Dipstick    No orders of the defined types were placed in this encounter.   Time spent: 35 Minutes  This patient was seen by Orson Gear AGNP-C in Collaboration with Dr [name] as a part of collaborative care agreement.  Kendell Bane AGNP-C Internal Medicine

## 2018-05-21 ENCOUNTER — Encounter: Payer: Self-pay | Admitting: Adult Health

## 2018-05-23 ENCOUNTER — Other Ambulatory Visit: Payer: Self-pay | Admitting: Adult Health

## 2018-05-23 ENCOUNTER — Ambulatory Visit: Payer: Medicare Other | Admitting: Adult Health

## 2018-05-23 ENCOUNTER — Encounter: Payer: Self-pay | Admitting: Adult Health

## 2018-05-23 VITALS — BP 188/80 | HR 91 | Resp 16 | Ht 60.0 in | Wt 114.3 lb

## 2018-05-23 DIAGNOSIS — L02419 Cutaneous abscess of limb, unspecified: Secondary | ICD-10-CM

## 2018-05-23 DIAGNOSIS — D0470 Carcinoma in situ of skin of unspecified lower limb, including hip: Secondary | ICD-10-CM

## 2018-05-23 DIAGNOSIS — I10 Essential (primary) hypertension: Secondary | ICD-10-CM | POA: Diagnosis not present

## 2018-05-23 DIAGNOSIS — L03119 Cellulitis of unspecified part of limb: Secondary | ICD-10-CM | POA: Diagnosis not present

## 2018-05-23 MED ORDER — CLINDAMYCIN HCL 300 MG PO CAPS: 300.0000 mg | ORAL_CAPSULE | Freq: Three times a day (TID) | ORAL | 0 refills | Status: DC

## 2018-05-23 NOTE — Progress Notes (Signed)
[facility] ([place]) [place], [place] [address]  Internal MEDICINE  Office Visit Note  Patient Name: [name]  [id number]  [id number]  Date of Service: 06/02/2018  Chief Complaint  Patient presents with  . Edema    On December 3rd pt seen at specialist, pt has swelling and blisters on her left leg, believes that she have some infection in the leg, was currently taking doxycycline and it has not helped. pt has an appointment on Monday.      HPI Pt is here for a sick visit.  Patient is here reporting excess swelling to left leg.  She has had multiple skin cancers removed and is receiving some wound care to that left lower leg.  She is current because the doxycycline she is taking does not appear to be working.  She is almost completed the course and still has redness pain and excessive drainage from these wound sites.  The leg appears swollen and reddened to me in the office today.  Patient will most likely need another antibiotic and I have encouraged her to follow-up with dermatology.     Current Medication:  Outpatient Encounter Medications as of 05/23/2018  Medication Sig  . amLODipine (NORVASC) 5 MG tablet TAKE 1 TABLET BY MOUTH AT BEDTIME FOR HYPERTENSION  . Ascorbic Acid (VITAMIN C) 1000 MG tablet Take 1,000 mg by mouth daily.  Marland Kitchen aspirin EC 81 MG tablet Take 81 mg by mouth daily.  . Biotin 5000 MCG TABS Take by mouth daily.  . calcium-vitamin D (OSCAL WITH D) 500-200 MG-UNIT tablet Take 1 tablet by mouth daily with breakfast.  . cholecalciferol (VITAMIN D) 1000 units tablet Take 1,000 Units by mouth daily.  . clobetasol cream (TEMOVATE) 0.05 % Apply twice a day to legs  . clonazePAM (KLONOPIN) 1 MG tablet Take 1 tablet (1 mg total) by mouth at bedtime as needed for anxiety.  . cloNIDine (CATAPRES) 0.1 MG tablet Take 1 tablet (0.1 mg total) by mouth 2 (two) times daily.  Marland Kitchen doxycycline (VIBRA-TABS) 100 MG tablet Take 1 tablet (100 mg total) by mouth 2  (two) times daily.  Marland Kitchen estradiol (ESTRACE) 0.5 MG tablet TAKE 1 TABLET(S) BY MOUTH DAILY  . HYDROcodone-acetaminophen (NORCO/VICODIN) 5-325 MG tablet Take 1 tablet by mouth every 6 (six) hours as needed for moderate pain.  Marland Kitchen irbesartan (AVAPRO) 150 MG tablet Take 150 mg by mouth 2 (two) times daily.  Marland Kitchen labetalol (NORMODYNE) 100 MG tablet Take 2 tablets (200 mg total) by mouth 2 (two) times daily.  . montelukast (SINGULAIR) 10 MG tablet TAKE 1 TABLET BY MOUTH EVERY DAY  . Multiple Vitamin (MULTIVITAMIN) tablet Take 1 tablet by mouth daily.  . mupirocin ointment (BACTROBAN) 2 % Apply small amount to affected area twice daily.  . phenazopyridine (PYRIDIUM) 200 MG tablet Take one tab po bid for baldder prn  . PROAIR HFA 108 (90 Base) MCG/ACT inhaler Inhale 2 puffs into the lungs every 4 (four) hours as needed.  . vitamin B-12 (CYANOCOBALAMIN) 1000 MCG tablet Take 1,000 mcg by mouth daily.  Marland Kitchen VITAMIN E PO Take by mouth daily.  . clindamycin (CLEOCIN) 300 MG capsule Take 1 capsule (300 mg total) by mouth 3 (three) times daily.   No facility-administered encounter medications on file as of 05/23/2018.       Medical History: Past Medical History:  Diagnosis Date  . Asthma    in past  . Heart murmur    Pt states she has Heart Murmur  .  Hypertension   . Lower leg injury 04/24/2016   08/06/17- pt reports legs still not completely healed  . Renal insufficiency    10%-Right, 90%-Left (per pt)  . Skin cancer      Vital Signs: BP (!) 188/80 (BP Location: Left Arm, Patient Position: Sitting, Cuff Size: Normal)   Pulse 91   Resp 16   Ht 5' (1.524 m)   Wt 114 lb 4.8 oz (51.8 kg)   SpO2 99%   BMI 22.32 kg/m    Review of Systems  Constitutional: Negative for chills, fatigue and unexpected weight change.  HENT: Negative for congestion, rhinorrhea, sneezing and sore throat.   Eyes: Negative for photophobia, pain and redness.  Respiratory: Negative for cough, chest tightness and shortness of  breath.   Cardiovascular: Negative for chest pain and palpitations.  Gastrointestinal: Negative for abdominal pain, constipation, diarrhea, nausea and vomiting.  Endocrine: Negative.   Genitourinary: Negative for dysuria and frequency.  Musculoskeletal: Negative for arthralgias, back pain, joint swelling and neck pain.  Skin: Positive for color change and wound. Negative for rash.  Allergic/Immunologic: Negative.   Neurological: Negative for tremors and numbness.  Hematological: Negative for adenopathy. Does not bruise/bleed easily.  Psychiatric/Behavioral: Negative for behavioral problems and sleep disturbance. The patient is not nervous/anxious.     Physical Exam Vitals signs and nursing note reviewed.  Constitutional:      General: She is not in acute distress.    Appearance: She is well-developed. She is not diaphoretic.  HENT:     Head: Normocephalic and atraumatic.     Mouth/Throat:     Pharynx: No oropharyngeal exudate.  Eyes:     Pupils: Pupils are equal, round, and reactive to light.  Neck:     Musculoskeletal: Normal range of motion and neck supple.     Thyroid: No thyromegaly.     Vascular: No JVD.     Trachea: No tracheal deviation.  Cardiovascular:     Rate and Rhythm: Normal rate and regular rhythm.     Heart sounds: Normal heart sounds. No murmur. No friction rub. No gallop.   Pulmonary:     Effort: Pulmonary effort is normal. No respiratory distress.     Breath sounds: Normal breath sounds. No wheezing or rales.  Chest:     Chest wall: No tenderness.  Abdominal:     Palpations: Abdomen is soft.     Tenderness: There is no abdominal tenderness. There is no guarding.  Musculoskeletal: Normal range of motion.  Lymphadenopathy:     Cervical: No cervical adenopathy.  Skin:    General: Skin is warm and dry.     Findings: Erythema present.     Comments: Multiple wounds from surgical procedures by dermatologist.   Neurological:     Mental Status: She is alert  and oriented to person, place, and time.     Cranial Nerves: No cranial nerve deficit.  Psychiatric:        Behavior: Behavior normal.        Thought Content: Thought content normal.        Judgment: Judgment normal.    Assessment/Plan: 1. Cellulitis and abscess of leg Pace patient on course of clindamycin 300 mg 3 times daily for 7 days.  I have encouraged patient to call the dermatologist office and see if they would like to do something different before filling this medication.  However she reports multiple calls to the office that have not been returned.  I have instructed her  that if she does not get confirmation from dermatology she may go ahead and start the clindamycin and address this with them at her next visit.  2. 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  These areas appear to be cellulitic or infected at this time.  Patient will follow-up with dermatology as scheduled.  3. Hypertension, poor control Patient's blood pressure elevated slightly at today's visit 188/80.  She does report some ongoing pain in her left leg where she had multiple places of skin cancer removed recently.  This is most likely driving her blood pressure up and we will continue to monitor this at future visits.  Have instructed patient that if she gets a headache, shortness of breath, or chest pain she should return to clinic or go directly emergency room.  General Counseling: Sharlee verbalizes understanding of the findings of todays visit and agrees with plan of treatment. I have discussed any further diagnostic evaluation that may be needed or ordered today. We also reviewed her medications today. she has been encouraged to call the office with any questions or concerns that should arise related to todays visit.   No orders of the defined types were placed in this encounter.   Meds ordered  this encounter  Medications  . clindamycin (CLEOCIN) 300 MG capsule    Sig: Take 1 capsule (300 mg total) by mouth 3 (three) times daily.    Dispense:  21 capsule    Refill:  0    Time spent: 25  Minutes  This patient was seen by Orson Gear AGNP-C in Collaboration with Dr [name] as a part of collaborative care agreement.  Kendell Bane AGNP-C Internal Medicine

## 2018-05-27 ENCOUNTER — Ambulatory Visit: Payer: Medicare Other

## 2018-05-28 ENCOUNTER — Ambulatory Visit
Admission: RE | Admit: 2018-05-28 | Discharge: 2018-05-28 | Disposition: A | Discharge status: Home / Self Care | Payer: Medicare Other | Source: Ambulatory Visit | Attending: Adult Health | Admitting: Adult Health

## 2018-05-28 DIAGNOSIS — R443 Hallucinations, unspecified: Secondary | ICD-10-CM

## 2018-06-07 ENCOUNTER — Telehealth: Payer: Self-pay

## 2018-06-07 NOTE — Telephone Encounter (Signed)
CT scan showing nothing acute. 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 But overall, the head CT showed nothing acute or new.

## 2018-06-07 NOTE — Telephone Encounter (Signed)
Notified pt that there is nothing acute.

## 2018-06-26 ENCOUNTER — Other Ambulatory Visit: Payer: Self-pay

## 2018-06-26 MED ORDER — ESTRADIOL 0.5 MG PO TABS: ORAL_TABLET | ORAL | 2 refills | Status: DC

## 2018-06-27 ENCOUNTER — Institutional Professional Consult (permissible substitution): Payer: Medicare Other | Admitting: Radiation Oncology

## 2018-07-15 ENCOUNTER — Other Ambulatory Visit: Payer: Self-pay

## 2018-07-15 ENCOUNTER — Ambulatory Visit
Admission: RE | Admit: 2018-07-15 | Discharge: 2018-07-15 | Disposition: A | Discharge status: Home / Self Care | Payer: Medicare Other | Source: Ambulatory Visit | Attending: Radiation Oncology | Admitting: Radiation Oncology

## 2018-07-15 ENCOUNTER — Encounter: Payer: Self-pay | Admitting: Radiation Oncology

## 2018-07-15 VITALS — BP 195/84 | HR 78 | Temp 96.1°F | Resp 16 | Wt 115.9 lb

## 2018-07-15 DIAGNOSIS — C44729 Squamous cell carcinoma of skin of left lower limb, including hip: Secondary | ICD-10-CM | POA: Insufficient documentation

## 2018-07-15 DIAGNOSIS — J45909 Unspecified asthma, uncomplicated: Secondary | ICD-10-CM | POA: Diagnosis not present

## 2018-07-15 DIAGNOSIS — C44722 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, including hip: Secondary | ICD-10-CM | POA: Diagnosis not present

## 2018-07-15 DIAGNOSIS — N289 Disorder of kidney and ureter, unspecified: Secondary | ICD-10-CM | POA: Diagnosis not present

## 2018-07-15 DIAGNOSIS — R011 Cardiac murmur, unspecified: Secondary | ICD-10-CM | POA: Diagnosis not present

## 2018-07-15 DIAGNOSIS — Z79899 Other long term (current) drug therapy: Secondary | ICD-10-CM | POA: Diagnosis not present

## 2018-07-15 DIAGNOSIS — I10 Essential (primary) hypertension: Secondary | ICD-10-CM | POA: Diagnosis not present

## 2018-07-15 MED ORDER — AMLODIPINE BESYLATE 5 MG PO TABS: ORAL_TABLET | ORAL | 1 refills | Status: DC

## 2018-07-15 NOTE — Consult Note (Signed)
NEW PATIENT EVALUATION  Name: [name]  MRN: [id number]  Date:   07/15/2018     DOB: August 04, 1928   This 85 y.o. female patient presents to the clinic for initial evaluation of bilateral field cancerization of her lower extremities.  REFERRING PHYSICIAN: Brendolyn Patty, MD  CHIEF COMPLAINT:  Chief Complaint  Patient presents with  . Cancer    initial consult skin cancer    DIAGNOSIS: The primary encounter diagnosis was SCC (squamous cell carcinoma), leg, left. A diagnosis of SCC (squamous cell carcinoma), leg, right was also pertinent to this visit.   PREVIOUS INVESTIGATIONS:  Pathology reports reviewed Clinical notes reviewed  HPI: patient is a 85 year old female who has been treated with cancerization of her lower extremities over the past years. She has been treated with interleukin 5-FU curettage Moses chemosurgery. She has multiple open wounds on the right lower extremity greater than the left. She's been on trial antibiotics with doxycycline. She is a basal cell carcinoma right dorsal foot. Recently saw a dermatologist is recommended electron beam therapy seen today for consultation. Both legs are bandaged multiple open small wound areas. Widespread generalized cancerization of her lower extremities is noted.  PLANNED TREATMENT REGIMEN: electron beam therapy  PAST MEDICAL HISTORY:  has a past medical history of Asthma, Heart murmur, Hypertension, Lower leg injury (04/24/2016), Renal insufficiency, and Skin cancer.    PAST SURGICAL HISTORY:  Past Surgical History:  Procedure Laterality Date  . ABDOMINAL HYSTERECTOMY    . COLONOSCOPY    . RENAL ARTERY STENT    . TONSILLECTOMY      FAMILY HISTORY: family history includes Hypertension in her mother.  SOCIAL HISTORY:  reports that she has never smoked. She has never used smokeless tobacco. She reports that she does not drink alcohol or use drugs.  ALLERGIES: Bacitracin; Neomycin; Penicillamine; and  Penicillins  MEDICATIONS:  Current Outpatient Medications  Medication Sig Dispense Refill  . amLODipine (NORVASC) 5 MG tablet TAKE 1 TABLET BY MOUTH AT BEDTIME FOR HYPERTENSION 90 tablet 1  . Ascorbic Acid (VITAMIN C) 1000 MG tablet Take 1,000 mg by mouth daily.    Marland Kitchen aspirin EC 81 MG tablet Take 81 mg by mouth daily.    . Biotin 5000 MCG TABS Take by mouth daily.    . calcium-vitamin D (OSCAL WITH D) 500-200 MG-UNIT tablet Take 1 tablet by mouth daily with breakfast.    . cholecalciferol (VITAMIN D) 1000 units tablet Take 1,000 Units by mouth daily.    . clobetasol cream (TEMOVATE) 0.05 % Apply twice a day to legs    . clonazePAM (KLONOPIN) 1 MG tablet Take 1 tablet (1 mg total) by mouth at bedtime as needed for anxiety. 30 tablet 2  . cloNIDine (CATAPRES) 0.1 MG tablet Take 1 tablet (0.1 mg total) by mouth 2 (two) times daily. 60 tablet 5  . estradiol (ESTRACE) 0.5 MG tablet TAKE 1 TABLET(S) BY MOUTH DAILY 90 tablet 2  . irbesartan (AVAPRO) 150 MG tablet Take 150 mg by mouth 2 (two) times daily.  2  . labetalol (NORMODYNE) 100 MG tablet Take 2 tablets (200 mg total) by mouth 2 (two) times daily. 180 tablet 2  . montelukast (SINGULAIR) 10 MG tablet TAKE 1 TABLET BY MOUTH EVERY DAY 90 tablet 4  . Multiple Vitamin (MULTIVITAMIN) tablet Take 1 tablet by mouth daily.    . mupirocin ointment (BACTROBAN) 2 % Apply small amount to affected area twice daily. 30 g 2  . phenazopyridine (PYRIDIUM) 200 MG tablet  Take one tab po bid for baldder prn 60 tablet 2  . PROAIR HFA 108 (90 Base) MCG/ACT inhaler Inhale 2 puffs into the lungs every 4 (four) hours as needed.  5  . triamcinolone cream (KENALOG) 0.1 % Apply 1 application topically 2 (two) times daily.    . vitamin B-12 (CYANOCOBALAMIN) 1000 MCG tablet Take 1,000 mcg by mouth daily.    Marland Kitchen VITAMIN E PO Take by mouth daily.    Marland Kitchen HYDROcodone-acetaminophen (NORCO/VICODIN) 5-325 MG tablet Take 1 tablet by mouth every 6 (six) hours as needed for moderate  pain. (Patient not taking: Reported on 07/15/2018) 30 tablet 0   No current facility-administered medications for this encounter.     ECOG PERFORMANCE STATUS:  1 - Symptomatic but completely ambulatory  REVIEW OF SYSTEMS:  Patient denies any weight loss, fatigue, weakness, fever, chills or night sweats. Patient denies any loss of vision, blurred vision. Patient denies any ringing  of the ears or hearing loss. No irregular heartbeat. Patient denies heart murmur or history of fainting. Patient denies any chest pain or pain radiating to her upper extremities. Patient denies any shortness of breath, difficulty breathing at night, cough or hemoptysis. Patient denies any swelling in the lower legs. Patient denies any nausea vomiting, vomiting of blood, or coffee ground material in the vomitus. Patient denies any stomach pain. Patient states has had normal bowel movements no significant constipation or diarrhea. Patient denies any dysuria, hematuria or significant nocturia. Patient denies any problems walking, swelling in the joints or loss of balance. Patient denies any skin changes, loss of hair or loss of weight. Patient denies any excessive worrying or anxiety or significant depression. Patient denies any problems with insomnia. Patient denies excessive thirst, polyuria, polydipsia. Patient denies any swollen glands, patient denies easy bruising or easy bleeding. Patient denies any recent infections, allergies or URI. Patient "s visual fields have not changed significantly in recent time.    PHYSICAL EXAM: BP (!) 195/84 (BP Location: Left Arm, Patient Position: Sitting)   Pulse 78   Temp (!) 96.1 F (35.6 C) (Tympanic)   Resp 16   Wt 115 lb 13.6 oz (52.6 kg)   BMI 22.63 kg/m  Multiple actinic solar keratoses multiple areas of previously treated skin cancers throughout mostly the anterior sections of bilateral lower extremitiesWell-developed well-nourished patient in NAD. HEENT reveals PERLA, EOMI,  discs not visualized.  Oral cavity is clear. No oral mucosal lesions are identified. Neck is clear without evidence of cervical or supraclavicular adenopathy. Lungs are clear to A&P. Cardiac examination is essentially unremarkable with regular rate and rhythm without murmur rub or thrill. Abdomen is benign with no organomegaly or masses noted. Motor sensory and DTR levels are equal and symmetric in the upper and lower extremities. Cranial nerves II through XII are grossly intact. Proprioception is intact. No peripheral adenopathy or edema is identified. No motor or sensory levels are noted. Crude visual fields are within normal range.  LABORATORY DATA: previous pathology reports reviewed    RADIOLOGY RESULTS:films to review   IMPRESSION: bilateral cancerization of her lower extremities in 85 year old female  PLAN: t this time I to go ahead and try her left lower extremity anteriorly with electron beam therapy. Would plan on 5000 cGy over 5 weeks. Risks and benefits of treatment including skin changes fatigue, possible slight chance of edema of her lower extremity is all were discussed with the patient and her daughter.We'll try her left lower extremity if that works will go ahead to  the right lower extremity.I have personally 7 ordered CT simulation for later this week.  I would like to take this opportunity to thank you for allowing me to participate in the care of your patient.Noreene Filbert, MD

## 2018-07-18 ENCOUNTER — Other Ambulatory Visit: Payer: Self-pay | Admitting: Internal Medicine

## 2018-07-18 ENCOUNTER — Ambulatory Visit: Payer: Medicare Other

## 2018-07-22 ENCOUNTER — Ambulatory Visit
Admission: RE | Admit: 2018-07-22 | Discharge: 2018-07-22 | Disposition: A | Discharge status: Home / Self Care | Payer: Medicare Other | Source: Ambulatory Visit | Attending: Radiation Oncology | Admitting: Radiation Oncology

## 2018-07-22 DIAGNOSIS — C44729 Squamous cell carcinoma of skin of left lower limb, including hip: Secondary | ICD-10-CM | POA: Insufficient documentation

## 2018-07-22 DIAGNOSIS — Z510 Encounter for antineoplastic radiation therapy: Secondary | ICD-10-CM | POA: Diagnosis not present

## 2018-07-24 DIAGNOSIS — Z510 Encounter for antineoplastic radiation therapy: Secondary | ICD-10-CM | POA: Diagnosis not present

## 2018-07-29 ENCOUNTER — Ambulatory Visit: Payer: Medicare Other

## 2018-07-29 ENCOUNTER — Ambulatory Visit: Payer: Self-pay | Admitting: Nurse Practitioner

## 2018-07-29 ENCOUNTER — Ambulatory Visit
Admission: RE | Admit: 2018-07-29 | Discharge: 2018-07-29 | Disposition: A | Discharge status: Home / Self Care | Payer: Medicare Other | Source: Ambulatory Visit | Attending: Radiation Oncology | Admitting: Radiation Oncology

## 2018-07-29 DIAGNOSIS — C44729 Squamous cell carcinoma of skin of left lower limb, including hip: Secondary | ICD-10-CM | POA: Insufficient documentation

## 2018-07-29 DIAGNOSIS — Z510 Encounter for antineoplastic radiation therapy: Secondary | ICD-10-CM | POA: Diagnosis not present

## 2018-07-30 ENCOUNTER — Ambulatory Visit
Admission: RE | Admit: 2018-07-30 | Discharge: 2018-07-30 | Disposition: A | Discharge status: Home / Self Care | Payer: Medicare Other | Source: Ambulatory Visit | Attending: Radiation Oncology | Admitting: Radiation Oncology

## 2018-07-30 ENCOUNTER — Ambulatory Visit: Payer: Medicare Other

## 2018-07-30 DIAGNOSIS — Z510 Encounter for antineoplastic radiation therapy: Secondary | ICD-10-CM | POA: Diagnosis not present

## 2018-07-31 ENCOUNTER — Ambulatory Visit: Payer: Medicare Other

## 2018-07-31 ENCOUNTER — Ambulatory Visit
Admission: RE | Admit: 2018-07-31 | Discharge: 2018-07-31 | Disposition: A | Discharge status: Home / Self Care | Payer: Medicare Other | Source: Ambulatory Visit | Attending: Radiation Oncology | Admitting: Radiation Oncology

## 2018-07-31 DIAGNOSIS — Z510 Encounter for antineoplastic radiation therapy: Secondary | ICD-10-CM | POA: Diagnosis not present

## 2018-08-01 ENCOUNTER — Ambulatory Visit
Admission: RE | Admit: 2018-08-01 | Discharge: 2018-08-01 | Disposition: A | Discharge status: Home / Self Care | Payer: Medicare Other | Source: Ambulatory Visit | Attending: Radiation Oncology | Admitting: Radiation Oncology

## 2018-08-01 ENCOUNTER — Ambulatory Visit: Payer: Medicare Other

## 2018-08-01 DIAGNOSIS — Z510 Encounter for antineoplastic radiation therapy: Secondary | ICD-10-CM | POA: Diagnosis not present

## 2018-08-02 ENCOUNTER — Ambulatory Visit
Admission: RE | Admit: 2018-08-02 | Discharge: 2018-08-02 | Disposition: A | Discharge status: Home / Self Care | Payer: Medicare Other | Source: Ambulatory Visit | Attending: Radiation Oncology | Admitting: Radiation Oncology

## 2018-08-02 ENCOUNTER — Ambulatory Visit: Payer: Medicare Other

## 2018-08-02 DIAGNOSIS — Z510 Encounter for antineoplastic radiation therapy: Secondary | ICD-10-CM | POA: Diagnosis not present

## 2018-08-05 ENCOUNTER — Ambulatory Visit: Payer: Medicare Other

## 2018-08-05 ENCOUNTER — Ambulatory Visit
Admission: RE | Admit: 2018-08-05 | Discharge: 2018-08-05 | Disposition: A | Discharge status: Home / Self Care | Payer: Medicare Other | Source: Ambulatory Visit | Attending: Radiation Oncology | Admitting: Radiation Oncology

## 2018-08-05 DIAGNOSIS — Z510 Encounter for antineoplastic radiation therapy: Secondary | ICD-10-CM | POA: Diagnosis not present

## 2018-08-06 ENCOUNTER — Ambulatory Visit: Payer: Medicare Other

## 2018-08-06 ENCOUNTER — Ambulatory Visit
Admission: RE | Admit: 2018-08-06 | Discharge: 2018-08-06 | Disposition: A | Discharge status: Home / Self Care | Payer: Medicare Other | Source: Ambulatory Visit | Attending: Radiation Oncology | Admitting: Radiation Oncology

## 2018-08-06 DIAGNOSIS — Z510 Encounter for antineoplastic radiation therapy: Secondary | ICD-10-CM | POA: Diagnosis not present

## 2018-08-07 ENCOUNTER — Ambulatory Visit: Payer: Medicare Other

## 2018-08-07 ENCOUNTER — Ambulatory Visit
Admission: RE | Admit: 2018-08-07 | Discharge: 2018-08-07 | Disposition: A | Discharge status: Home / Self Care | Payer: Medicare Other | Source: Ambulatory Visit | Attending: Radiation Oncology | Admitting: Radiation Oncology

## 2018-08-07 DIAGNOSIS — Z510 Encounter for antineoplastic radiation therapy: Secondary | ICD-10-CM | POA: Diagnosis not present

## 2018-08-08 ENCOUNTER — Ambulatory Visit: Payer: Medicare Other

## 2018-08-08 ENCOUNTER — Other Ambulatory Visit: Payer: Self-pay

## 2018-08-08 ENCOUNTER — Ambulatory Visit
Admission: RE | Admit: 2018-08-08 | Discharge: 2018-08-08 | Disposition: A | Discharge status: Home / Self Care | Payer: Medicare Other | Source: Ambulatory Visit | Attending: Radiation Oncology | Admitting: Radiation Oncology

## 2018-08-08 DIAGNOSIS — Z510 Encounter for antineoplastic radiation therapy: Secondary | ICD-10-CM | POA: Diagnosis not present

## 2018-08-09 ENCOUNTER — Ambulatory Visit
Admission: RE | Admit: 2018-08-09 | Discharge: 2018-08-09 | Disposition: A | Discharge status: Home / Self Care | Payer: Medicare Other | Source: Ambulatory Visit | Attending: Radiation Oncology | Admitting: Radiation Oncology

## 2018-08-09 ENCOUNTER — Ambulatory Visit: Payer: Medicare Other

## 2018-08-09 ENCOUNTER — Other Ambulatory Visit: Payer: Self-pay

## 2018-08-09 DIAGNOSIS — Z510 Encounter for antineoplastic radiation therapy: Secondary | ICD-10-CM | POA: Diagnosis not present

## 2018-08-12 ENCOUNTER — Other Ambulatory Visit: Payer: Self-pay

## 2018-08-12 ENCOUNTER — Ambulatory Visit: Payer: Medicare Other

## 2018-08-13 ENCOUNTER — Ambulatory Visit: Payer: Medicare Other

## 2018-08-14 ENCOUNTER — Telehealth: Payer: Self-pay

## 2018-08-14 ENCOUNTER — Ambulatory Visit: Payer: Medicare Other

## 2018-08-14 NOTE — Telephone Encounter (Signed)
Pt called that she want to talk to heather I advised her to make appt she  [name] she will call back and also I relayed message to heather that pt stopped radiation

## 2018-08-15 ENCOUNTER — Ambulatory Visit: Payer: Medicare Other

## 2018-08-16 ENCOUNTER — Ambulatory Visit: Payer: Medicare Other

## 2018-08-19 ENCOUNTER — Ambulatory Visit: Payer: Medicare Other

## 2018-08-20 ENCOUNTER — Ambulatory Visit: Payer: Medicare Other

## 2018-08-21 ENCOUNTER — Ambulatory Visit: Payer: Medicare Other

## 2018-08-22 ENCOUNTER — Ambulatory Visit: Payer: Medicare Other

## 2018-08-23 ENCOUNTER — Ambulatory Visit: Payer: Medicare Other

## 2018-08-26 ENCOUNTER — Ambulatory Visit: Payer: Medicare Other

## 2018-08-27 ENCOUNTER — Ambulatory Visit: Payer: Medicare Other

## 2018-08-28 ENCOUNTER — Ambulatory Visit: Payer: Medicare Other

## 2018-08-29 ENCOUNTER — Ambulatory Visit: Payer: Medicare Other

## 2018-08-30 ENCOUNTER — Ambulatory Visit: Payer: Medicare Other

## 2018-09-02 ENCOUNTER — Ambulatory Visit: Payer: Medicare Other

## 2018-09-03 ENCOUNTER — Ambulatory Visit: Payer: Medicare Other

## 2018-09-21 ENCOUNTER — Other Ambulatory Visit: Payer: Self-pay | Admitting: Internal Medicine

## 2018-09-21 DIAGNOSIS — I10 Essential (primary) hypertension: Secondary | ICD-10-CM

## 2018-09-30 ENCOUNTER — Other Ambulatory Visit: Payer: Self-pay

## 2018-09-30 MED ORDER — CLONIDINE HCL 0.1 MG PO TABS: 0.1000 mg | ORAL_TABLET | Freq: Two times a day (BID) | ORAL | 5 refills | Status: DC

## 2018-12-19 ENCOUNTER — Encounter: Payer: Self-pay | Admitting: Nurse Practitioner

## 2018-12-19 ENCOUNTER — Other Ambulatory Visit: Payer: Self-pay

## 2018-12-19 ENCOUNTER — Ambulatory Visit (INDEPENDENT_AMBULATORY_CARE_PROVIDER_SITE_OTHER): Payer: Medicare Other | Admitting: Nurse Practitioner

## 2018-12-19 VITALS — BP 180/87 | HR 78 | Resp 16 | Ht 60.0 in | Wt 114.6 lb

## 2018-12-19 DIAGNOSIS — Z0001 Encounter for general adult medical examination with abnormal findings: Secondary | ICD-10-CM

## 2018-12-19 DIAGNOSIS — L02419 Cutaneous abscess of limb, unspecified: Secondary | ICD-10-CM

## 2018-12-19 DIAGNOSIS — D0470 Carcinoma in situ of skin of unspecified lower limb, including hip: Secondary | ICD-10-CM | POA: Diagnosis not present

## 2018-12-19 DIAGNOSIS — I10 Essential (primary) hypertension: Secondary | ICD-10-CM

## 2018-12-19 DIAGNOSIS — F411 Generalized anxiety disorder: Secondary | ICD-10-CM | POA: Diagnosis not present

## 2018-12-19 DIAGNOSIS — L03119 Cellulitis of unspecified part of limb: Secondary | ICD-10-CM | POA: Diagnosis not present

## 2018-12-19 MED ORDER — CLONAZEPAM 1 MG PO TABS: 1.0000 mg | ORAL_TABLET | Freq: Every evening | ORAL | 2 refills | Status: DC | PRN

## 2018-12-19 MED ORDER — LABETALOL HCL 100 MG PO TABS: 150.0000 mg | ORAL_TABLET | Freq: Two times a day (BID) | ORAL | 2 refills | Status: DC

## 2018-12-19 MED ORDER — DOXYCYCLINE HYCLATE 100 MG PO TABS: 100.0000 mg | ORAL_TABLET | Freq: Two times a day (BID) | ORAL | 0 refills | Status: DC

## 2018-12-19 NOTE — Progress Notes (Signed)
[facility] [place], [place] [address]  Internal MEDICINE  Office Visit Note  Patient Name: [name]  [id number]  [id number]  Date of Service: 12/19/2018   Pt is here for routine health maintenance examination   Chief Complaint  Patient presents with  . Medicare Wellness    pt have some concerns about her rx, and she also have a sore on the bottom of both feet that hurts when she walk  . Hypertension  . Asthma     The patient is here for annual wellness visit. She needs to have new referral to dermatologist. She saw Dr. Nicole Kindred who then sent her on to dermatology oncologist. She had radiation on the left lower leg. On the second week, she started having such pain of her left leg, she had to stop going to treatments. She was to have five weeks on the left lower leg and an additional five weeks on the right lower leg. She has significant skin lesions and breakdown of the skin of both lower legs as well as places on her face which need to be treated. Also has blistering present on bottoms of both feet which are painful. Blood pressure continues to be very elevated, however, it is better than it is normally in the office. 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 Unable to have surgery to repair this as blood pressure is so high.     Current Medication: Outpatient Encounter Medications as of 12/19/2018  Medication Sig  . amLODipine (NORVASC) 5 MG tablet TAKE 1 TABLET BY MOUTH AT BEDTIME FOR HYPERTENSION  . Ascorbic Acid (VITAMIN C) 1000 MG tablet Take 1,000 mg by mouth daily.  Marland Kitchen aspirin EC 81 MG tablet Take 81 mg by mouth daily.  . Biotin 5000 MCG TABS Take by mouth daily.  . calcium-vitamin D (OSCAL WITH D) 500-200 MG-UNIT tablet Take 1 tablet by mouth daily with breakfast.  . cholecalciferol (VITAMIN D) 1000 units tablet Take 1,000  Units by mouth daily.  . clobetasol cream (TEMOVATE) 0.05 % Apply twice a day to legs  . clonazePAM (KLONOPIN) 1 MG tablet Take 1 tablet (1 mg total) by mouth at bedtime as needed for anxiety.  . cloNIDine (CATAPRES) 0.1 MG tablet Take 1 tablet (0.1 mg total) by mouth 2 (two) times daily.  Marland Kitchen estradiol (ESTRACE) 0.5 MG tablet TAKE 1 TABLET(S) BY MOUTH DAILY  . irbesartan (AVAPRO) 150 MG tablet TAKE ONE TAB BY MOUTH TWICE A DAY  . labetalol (NORMODYNE) 100 MG tablet Take 1.5 tablets (150 mg total) by mouth 2 (two) times daily.  . montelukast (SINGULAIR) 10 MG tablet TAKE 1 TABLET BY MOUTH EVERY DAY  . Multiple Vitamin (MULTIVITAMIN) tablet Take 1 tablet by mouth daily.  . mupirocin ointment (BACTROBAN) 2 % Apply small amount to affected area twice daily.  . phenazopyridine (PYRIDIUM) 200 MG tablet Take one tab po bid for baldder prn  . PROAIR HFA 108 (90 Base) MCG/ACT inhaler Inhale 2 puffs into the lungs every 4 (four) hours as needed.  . triamcinolone cream (KENALOG) 0.1 % Apply 1 application topically 2 (two) times daily.  . vitamin B-12 (CYANOCOBALAMIN) 1000 MCG tablet Take 1,000 mcg by mouth daily.  Marland Kitchen VITAMIN E PO Take by mouth daily.  . [DISCONTINUED] clonazePAM (KLONOPIN) 1 MG tablet Take 1 tablet (1 mg total) by mouth at bedtime as needed for  anxiety.  . [DISCONTINUED] labetalol (NORMODYNE) 100 MG tablet TAKE 1 TABLET BY MOUTH TWICE A DAY  . doxycycline (VIBRA-TABS) 100 MG tablet Take 1 tablet (100 mg total) by mouth 2 (two) times daily.  . [DISCONTINUED] HYDROcodone-acetaminophen (NORCO/VICODIN) 5-325 MG tablet Take 1 tablet by mouth every 6 (six) hours as needed for moderate pain. (Patient not taking: Reported on 07/15/2018)   No facility-administered encounter medications on file as of 12/19/2018.     Surgical History: Past Surgical History:  Procedure Laterality Date  . ABDOMINAL HYSTERECTOMY    . COLONOSCOPY    . RENAL ARTERY STENT    . TONSILLECTOMY      Medical  History: Past Medical History:  Diagnosis Date  . Asthma    in past  . Heart murmur    Pt states she has Heart Murmur  . Hypertension   . Lower leg injury 04/24/2016   08/06/17- pt reports legs still not completely healed  . Renal insufficiency    10%-Right, 90%-Left (per pt)  . Skin cancer     Family History: Family History  Problem Relation Age of Onset  . Hypertension Mother       Review of Systems  Constitutional: Negative for chills, fatigue and unexpected weight change.  HENT: Negative for congestion, rhinorrhea, sneezing and sore throat.   Eyes:       Reports numerous floaters in her eyes.   Respiratory: Negative for cough, chest tightness, shortness of breath and wheezing.   Cardiovascular: Negative for chest pain and palpitations.       Blood pressures is elevated  Gastrointestinal: Negative for abdominal pain, constipation, diarrhea, nausea and vomiting.  Endocrine: Negative for cold intolerance, heat intolerance, polydipsia and polyuria.  Genitourinary: Positive for frequency and urgency. Negative for dysuria.  Musculoskeletal: Negative for arthralgias, back pain, joint swelling and neck pain.  Skin: Positive for color change and wound. Negative for rash.  Neurological: Negative for dizziness, tremors, numbness and headaches.  Hematological: Negative for adenopathy. Does not bruise/bleed easily.  Psychiatric/Behavioral: Negative for behavioral problems and sleep disturbance. The patient is not nervous/anxious.      Today's Vitals   12/19/18 1424  BP: (!) 180/87  Pulse: 78  Resp: 16  SpO2: 99%  Weight: 114 lb 9.6 oz (52 kg)  Height: 5' (1.524 m)   Body mass index is 22.38 kg/m.  Physical Exam Vitals signs and nursing note reviewed.  Constitutional:      General: She is not in acute distress.    Appearance: Normal appearance. She is well-developed. She is not diaphoretic.  HENT:     Head: Normocephalic and atraumatic.     Mouth/Throat:      Pharynx: No oropharyngeal exudate.  Eyes:     Pupils: Pupils are equal, round, and reactive to light.  Neck:     Musculoskeletal: Normal range of motion and neck supple.     Thyroid: No thyromegaly.     Vascular: No JVD.     Trachea: No tracheal deviation.  Cardiovascular:     Rate and Rhythm: Normal rate and regular rhythm.     Heart sounds: Normal heart sounds. No murmur. No friction rub. No gallop.   Pulmonary:     Effort: Pulmonary effort is normal. No respiratory distress.     Breath sounds: Normal breath sounds. No wheezing or rales.  Chest:     Chest wall: No tenderness.  Abdominal:     General: Bowel sounds are normal.     Palpations:  Abdomen is soft.     Tenderness: There is no abdominal tenderness. There is no guarding.  Musculoskeletal: Normal range of motion.  Lymphadenopathy:     Cervical: No cervical adenopathy.  Skin:    General: Skin is warm and dry.     Findings: Erythema and lesion present.     Comments: Redness, pain, and swelling pf both lower legs. She has a circular, pustular lesion on the plantar surfaces of both feet. Very red and tender to palpate.   Neurological:     General: No focal deficit present.     Mental Status: She is alert and oriented to person, place, and time. Mental status is at baseline.     Cranial Nerves: No cranial nerve deficit.  Psychiatric:        Behavior: Behavior normal.        Thought Content: Thought content normal.        Judgment: Judgment normal.     Depression screen [facility] 2/9 12/19/2018 05/23/2018 04/29/2018 02/22/2018 01/25/2018  Decreased Interest 0 0 0 0 0  Down, Depressed, Hopeless 0 0 0 0 0  PHQ - 2 Score 0 0 0 0 0    Functional Status Survey: Is the patient deaf or have difficulty hearing?: Yes Does the patient have difficulty seeing, even when wearing glasses/contacts?: Yes Does the patient have difficulty concentrating, remembering, or making decisions?: Yes(remembering names) Does the patient have difficulty  walking or climbing stairs?: Yes Does the patient have difficulty dressing or bathing?: No Does the patient have difficulty doing errands alone such as visiting a doctor's office or shopping?: No  MMSE - [place] Exam 12/19/2018  Orientation to time 5  Orientation to Place 5  Registration 3  Attention/ Calculation 5  Recall 3  Language- name 2 objects 2  Language- repeat 1  Language- follow 3 step command 3  Language- read & follow direction 1  Write a sentence 1  Copy design 1  Total score 30    Fall Risk  12/19/2018 05/23/2018 04/29/2018 02/22/2018 01/25/2018  Falls in the past year? 0 0 0 No Yes  Number falls in past yr: - - - - 1  Injury with Fall? - - - - No    Assessment/Plan: 1. Encounter for general adult medical examination with abnormal findings Annual health maintenance exam today.  2. Hypertension, poor control Updated labetalol 100mg  to 1.5 tablets twice daily. She should continue other BP medications as prescribed  - labetalol (NORMODYNE) 100 MG tablet; Take 1.5 tablets (150 mg total) by mouth 2 (two) times daily.  Dispense: 270 tablet; Refill: 2  3. 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  - Ambulatory referral to Dermatology  4. Cellulitis and abscess of leg Start doxycycline 100mg  twice daily for 14 days. Refer to dermatology for further evaluation.  - Ambulatory referral to Dermatology - doxycycline (VIBRA-TABS) 100 MG tablet; Take 1 tablet (100 mg total) by mouth 2 (two) times daily.  Dispense: 28 tablet; Refill: 0  5. GAD (generalized anxiety disorder) May continue clonazepam 1mg  at bedtime as needed for anxiety/insomnia. - clonazePAM (KLONOPIN) 1 MG tablet; Take 1 tablet (1 mg total) by mouth at bedtime as needed for anxiety.  Dispense: 30 tablet; Refill: 2  General Counseling: Jaya verbalizes understanding of the findings of todays visit and  agrees with plan of treatment. I have discussed any further diagnostic evaluation that may be  needed or ordered today. We also reviewed her medications today. she has been encouraged to call the office with any questions or concerns that should arise related to todays visit.    Counseling:   This patient was seen by Leretha Pol FNP Collaboration with Dr [name] as a part of collaborative care agreement  Orders Placed This Encounter  Procedures  . Ambulatory referral to Dermatology    Meds ordered this encounter  Medications  . doxycycline (VIBRA-TABS) 100 MG tablet    Sig: Take 1 tablet (100 mg total) by mouth 2 (two) times daily.    Dispense:  28 tablet    Refill:  0    Order Specific Question:   Supervising Provider    Answer:   [name] [[id number]]  . labetalol (NORMODYNE) 100 MG tablet    Sig: Take 1.5 tablets (150 mg total) by mouth 2 (two) times daily.    Dispense:  270 tablet    Refill:  2    Order Specific Question:   Supervising Provider    Answer:   [name] [[id number]]  . clonazePAM (KLONOPIN) 1 MG tablet    Sig: Take 1 tablet (1 mg total) by mouth at bedtime as needed for anxiety.    Dispense:  30 tablet    Refill:  2    Order Specific Question:   Supervising Provider    Answer:   [name] [[id number]]    Time spent: [place], MD  Internal Medicine

## 2019-01-06 ENCOUNTER — Other Ambulatory Visit: Payer: Self-pay

## 2019-01-06 MED ORDER — AMLODIPINE BESYLATE 5 MG PO TABS: ORAL_TABLET | ORAL | 1 refills | Status: DC

## 2019-01-14 ENCOUNTER — Other Ambulatory Visit: Payer: Self-pay

## 2019-01-14 MED ORDER — CLONIDINE HCL 0.1 MG PO TABS: 0.1000 mg | ORAL_TABLET | Freq: Two times a day (BID) | ORAL | 5 refills | Status: DC

## 2019-03-17 ENCOUNTER — Other Ambulatory Visit: Payer: Self-pay | Admitting: Nurse Practitioner

## 2019-03-17 MED ORDER — ESTRADIOL 0.5 MG PO TABS: ORAL_TABLET | ORAL | 2 refills | Status: DC

## 2019-03-20 ENCOUNTER — Ambulatory Visit: Payer: Medicare Other | Admitting: Nurse Practitioner

## 2019-03-31 ENCOUNTER — Ambulatory Visit: Payer: Medicare Other | Admitting: Nurse Practitioner

## 2019-04-23 ENCOUNTER — Telehealth: Payer: Self-pay

## 2019-04-23 NOTE — Telephone Encounter (Signed)
CONFIRM AND SCREENED FOR COVID FOR 04-29-19.

## 2019-04-29 ENCOUNTER — Other Ambulatory Visit: Payer: Self-pay | Admitting: Nurse Practitioner

## 2019-04-29 ENCOUNTER — Ambulatory Visit (INDEPENDENT_AMBULATORY_CARE_PROVIDER_SITE_OTHER): Payer: Medicare Other | Admitting: Nurse Practitioner

## 2019-04-29 DIAGNOSIS — D0470 Carcinoma in situ of skin of unspecified lower limb, including hip: Secondary | ICD-10-CM

## 2019-04-29 DIAGNOSIS — I10 Essential (primary) hypertension: Secondary | ICD-10-CM

## 2019-04-29 DIAGNOSIS — L03119 Cellulitis of unspecified part of limb: Secondary | ICD-10-CM

## 2019-04-29 DIAGNOSIS — F411 Generalized anxiety disorder: Secondary | ICD-10-CM

## 2019-04-29 DIAGNOSIS — L02419 Cutaneous abscess of limb, unspecified: Secondary | ICD-10-CM

## 2019-04-29 DIAGNOSIS — J3089 Other allergic rhinitis: Secondary | ICD-10-CM

## 2019-04-29 MED ORDER — CLONAZEPAM 1 MG PO TABS: 1.0000 mg | ORAL_TABLET | Freq: Every evening | ORAL | 2 refills | Status: DC | PRN

## 2019-04-29 MED ORDER — DOXYCYCLINE HYCLATE 100 MG PO TABS: 100.0000 mg | ORAL_TABLET | Freq: Two times a day (BID) | ORAL | 1 refills | Status: DC

## 2019-04-29 MED ORDER — MONTELUKAST SODIUM 10 MG PO TABS: 10.0000 mg | ORAL_TABLET | Freq: Every day | ORAL | 3 refills | Status: DC

## 2019-04-29 NOTE — Progress Notes (Signed)
Renewed prescriptions for singulair and clonazepam. Restarted doxycycline 100mg  BID. Will do for 30 days with one refill.

## 2019-04-30 ENCOUNTER — Encounter: Payer: Self-pay | Admitting: Nurse Practitioner

## 2019-04-30 NOTE — Progress Notes (Signed)
[facility] [place], [place] [address]  Internal MEDICINE  Telephone Visit  Patient Name: [name]  [id number]  [id number]  Date of Service: 04/30/2019  I connected with the patient at 2:15pm by telephone and verified the patients identity using two identifiers.   I discussed the limitations, risks, security and privacy concerns of performing an evaluation and management service by telephone and the availability of in person appointments. I also discussed with the patient that there may be a patient responsible charge related to the service.  The patient expressed understanding and agrees to proceed.    Chief Complaint  Patient presents with  . Telephone Assessment  . Telephone Screen  . Hypertension    The patient has been contacted via telephone for follow up visit due to concerns for spread of novel coronavirus. The patient presents for follow up visit. She states she is doing well, overall. She does state that the skin on her lower legs is getting bad. She does have appointment, scheduled in April, with her dermatologist. Would like to get sooner appointment, but there have been no appointments available. She states that she is developing blisters and areas which are open and draining. She states that areas of concern are tender. The only thing she is able to take is tylenol to help pain. Most recently, she was treated with doxycycline, which helped for a short period of time.       Current Medication: Outpatient Encounter Medications as of 04/29/2019  Medication Sig  . amLODipine (NORVASC) 5 MG tablet TAKE 1 TABLET BY MOUTH AT BEDTIME FOR HYPERTENSION  . Ascorbic Acid (VITAMIN C) 1000 MG tablet Take 1,000 mg by mouth daily.  Marland Kitchen aspirin EC 81 MG tablet Take 81 mg by mouth daily.  . Biotin 5000 MCG TABS Take by mouth daily.  . calcium-vitamin D (OSCAL WITH D) 500-200 MG-UNIT tablet Take 1 tablet by mouth daily with breakfast.  . cholecalciferol  (VITAMIN D) 1000 units tablet Take 1,000 Units by mouth daily.  . clobetasol cream (TEMOVATE) 0.05 % Apply twice a day to legs  . clonazePAM (KLONOPIN) 1 MG tablet Take 1 tablet (1 mg total) by mouth at bedtime as needed for anxiety.  . cloNIDine (CATAPRES) 0.1 MG tablet Take 1 tablet (0.1 mg total) by mouth 2 (two) times daily.  Marland Kitchen doxycycline (VIBRA-TABS) 100 MG tablet Take 1 tablet (100 mg total) by mouth 2 (two) times daily.  Marland Kitchen estradiol (ESTRACE) 0.5 MG tablet TAKE 1 TABLET(S) BY MOUTH DAILY  . irbesartan (AVAPRO) 150 MG tablet TAKE ONE TAB BY MOUTH TWICE A DAY  . labetalol (NORMODYNE) 100 MG tablet Take 1.5 tablets (150 mg total) by mouth 2 (two) times daily.  . montelukast (SINGULAIR) 10 MG tablet Take 1 tablet (10 mg total) by mouth daily.  . Multiple Vitamin (MULTIVITAMIN) tablet Take 1 tablet by mouth daily.  . mupirocin ointment (BACTROBAN) 2 % Apply small amount to affected area twice daily.  . phenazopyridine (PYRIDIUM) 200 MG tablet Take one tab po bid for baldder prn  . PROAIR HFA 108 (90 Base) MCG/ACT inhaler Inhale 2 puffs into the lungs every 4 (four) hours as needed.  . triamcinolone cream (KENALOG) 0.1 % Apply 1 application topically 2 (two) times daily.  . vitamin B-12 (CYANOCOBALAMIN) 1000 MCG tablet Take 1,000 mcg by mouth daily.  Marland Kitchen VITAMIN E PO Take by mouth daily.  . [DISCONTINUED] clonazePAM (KLONOPIN) 1 MG tablet Take 1 tablet (1 mg total) by mouth  at bedtime as needed for anxiety.  . [DISCONTINUED] doxycycline (VIBRA-TABS) 100 MG tablet Take 1 tablet (100 mg total) by mouth 2 (two) times daily.  . [DISCONTINUED] montelukast (SINGULAIR) 10 MG tablet TAKE 1 TABLET BY MOUTH EVERY DAY   No facility-administered encounter medications on file as of 04/29/2019.     Surgical History: Past Surgical History:  Procedure Laterality Date  . ABDOMINAL HYSTERECTOMY    . COLONOSCOPY    . RENAL ARTERY STENT    . TONSILLECTOMY      Medical History: Past Medical History:   Diagnosis Date  . Asthma    in past  . Heart murmur    Pt states she has Heart Murmur  . Hypertension   . Lower leg injury 04/24/2016   08/06/17- pt reports legs still not completely healed  . Renal insufficiency    10%-Right, 90%-Left (per pt)  . Skin cancer     Family History: Family History  Problem Relation Age of Onset  . Hypertension Mother     Social History   Socioeconomic History  . Marital status: Widowed    Spouse name: Not on file  . Number of children: Not on file  . Years of education: Not on file  . Highest education level: Not on file  Occupational History  . Not on file  Social Needs  . Financial resource strain: Not on file  . Food insecurity    Worry: Not on file    Inability: Not on file  . Transportation needs    Medical: Not on file    Non-medical: Not on file  Tobacco Use  . Smoking status: Never Smoker  . Smokeless tobacco: Never Used  Substance and Sexual Activity  . Alcohol use: No  . Drug use: No  . Sexual activity: Not on file  Lifestyle  . Physical activity    Days per week: Not on file    Minutes per session: Not on file  . Stress: Not on file  Relationships  . Social Herbalist on phone: Not on file    Gets together: Not on file    Attends religious service: Not on file    Active member of club or organization: Not on file    Attends meetings of clubs or organizations: Not on file    Relationship status: Not on file  . Intimate partner violence    Fear of current or ex partner: Not on file    Emotionally abused: Not on file    Physically abused: Not on file    Forced sexual activity: Not on file  Other Topics Concern  . Not on file  Social History Narrative  . Not on file      Review of Systems  Constitutional: Negative for activity change, chills, fatigue and unexpected weight change.  HENT: Negative for congestion, postnasal drip, rhinorrhea, sneezing and sore throat.   Respiratory: Negative for  cough, chest tightness, shortness of breath and wheezing.   Cardiovascular: Negative for chest pain and palpitations.       Elevated blood pressure.  Gastrointestinal: Negative for abdominal pain, constipation, diarrhea, nausea and vomiting.  Endocrine: Negative for cold intolerance, heat intolerance, polydipsia and polyuria.  Musculoskeletal: Positive for arthralgias and myalgias. Negative for back pain, joint swelling and neck pain.  Skin: Negative for rash.       Chronic skin ulceration and infection of bilateral lower extremities. There are open areas present with drainaige.   Allergic/Immunologic: Negative  for environmental allergies.  Neurological: Negative for dizziness, tremors, numbness and headaches.  Hematological: Negative for adenopathy. Does not bruise/bleed easily.  Psychiatric/Behavioral: Negative for behavioral problems (Depression), sleep disturbance and suicidal ideas. The patient is nervous/anxious.     Vital Signs: There were no vitals taken for this visit.   Observation/Objective:   The patient is alert and oriented. She is pleasant and answers all questions appropriately. Breathing is non-labored. She is in no acute distress at this time.    Assessment/Plan:  1. Cellulitis and abscess of leg Round doxycycline 100mg  bid. Will do for extended period of time. Prescription for #60 tablets with 1 refill sent to her pharmacy. Will try to get sooner appointment with her dermatologist for further evaluation.   2. 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   3. Hypertension, poor control Should continue bp medication as prescribed   4. GAD (generalized anxiety disorder) May continue clonazepam as needed and as prescribed    General Counseling: Rossana verbalizes understanding of the findings of today's phone visit and agrees with plan of treatment.  I have discussed any further diagnostic evaluation that may be needed or ordered today. We also reviewed her medications today. she has been encouraged to call the office with any questions or concerns that should arise related to todays visit.  Hypertension Counseling:   The following hypertensive lifestyle modification were recommended and discussed:  1. Limiting alcohol intake to less than 1 oz/day of ethanol:(24 oz of beer or 8 oz of wine or 2 oz of 100-proof whiskey). 2. Take baby ASA 81 mg daily. 3. Importance of regular aerobic exercise and losing weight. 4. Reduce dietary saturated fat and cholesterol intake for overall cardiovascular health. 5. Maintaining adequate dietary potassium, calcium, and magnesium intake. 6. Regular monitoring of the blood pressure. 7. Reduce sodium intake to less than 100 mmol/day (less than 2.3 gm of sodium or less than 6 gm of sodium choride)   This patient was seen by [place] with Dr [name] as a part of collaborative care agreement  Time spent: 25 Minutes    Dr [name] Internal medicine

## 2019-06-24 ENCOUNTER — Other Ambulatory Visit: Payer: Self-pay

## 2019-06-24 ENCOUNTER — Encounter: Payer: Self-pay | Admitting: Ophthalmology

## 2019-06-27 ENCOUNTER — Other Ambulatory Visit
Admission: RE | Admit: 2019-06-27 | Discharge: 2019-06-27 | Disposition: A | Discharge status: Home / Self Care | Payer: Medicare Other | Source: Ambulatory Visit | Attending: Ophthalmology | Admitting: Ophthalmology

## 2019-06-27 ENCOUNTER — Other Ambulatory Visit: Payer: Self-pay

## 2019-06-27 DIAGNOSIS — Z20822 Contact with and (suspected) exposure to covid-19: Secondary | ICD-10-CM | POA: Diagnosis not present

## 2019-06-27 DIAGNOSIS — Z01812 Encounter for preprocedural laboratory examination: Secondary | ICD-10-CM | POA: Insufficient documentation

## 2019-06-27 LAB — SARS CORONAVIRUS 2 (TAT 6-24 HRS): SARS Coronavirus 2: NEGATIVE

## 2019-06-27 NOTE — Discharge Instructions (Signed)

## 2019-07-01 ENCOUNTER — Ambulatory Visit: Payer: Medicare Other | Admitting: Anesthesiology

## 2019-07-01 ENCOUNTER — Ambulatory Visit
Admission: RE | Admit: 2019-07-01 | Discharge: 2019-07-01 | Disposition: A | Discharge status: Home / Self Care | Payer: Medicare Other | Attending: Ophthalmology | Admitting: Ophthalmology

## 2019-07-01 ENCOUNTER — Other Ambulatory Visit: Payer: Self-pay

## 2019-07-01 ENCOUNTER — Encounter: Payer: Self-pay | Admitting: Ophthalmology

## 2019-07-01 ENCOUNTER — Encounter
Admission: RE | Disposition: A | Discharge status: Home / Self Care | Payer: Self-pay | Source: Home / Self Care | Attending: Ophthalmology

## 2019-07-01 DIAGNOSIS — I10 Essential (primary) hypertension: Secondary | ICD-10-CM | POA: Insufficient documentation

## 2019-07-01 DIAGNOSIS — Z880 Allergy status to penicillin: Secondary | ICD-10-CM | POA: Diagnosis not present

## 2019-07-01 DIAGNOSIS — Z960 Presence of urogenital implants: Secondary | ICD-10-CM | POA: Insufficient documentation

## 2019-07-01 DIAGNOSIS — F419 Anxiety disorder, unspecified: Secondary | ICD-10-CM | POA: Diagnosis not present

## 2019-07-01 DIAGNOSIS — Z7982 Long term (current) use of aspirin: Secondary | ICD-10-CM | POA: Insufficient documentation

## 2019-07-01 DIAGNOSIS — J302 Other seasonal allergic rhinitis: Secondary | ICD-10-CM | POA: Diagnosis not present

## 2019-07-01 DIAGNOSIS — H2511 Age-related nuclear cataract, right eye: Secondary | ICD-10-CM | POA: Diagnosis not present

## 2019-07-01 DIAGNOSIS — N289 Disorder of kidney and ureter, unspecified: Secondary | ICD-10-CM | POA: Diagnosis not present

## 2019-07-01 DIAGNOSIS — M1990 Unspecified osteoarthritis, unspecified site: Secondary | ICD-10-CM | POA: Insufficient documentation

## 2019-07-01 DIAGNOSIS — Z881 Allergy status to other antibiotic agents status: Secondary | ICD-10-CM | POA: Diagnosis not present

## 2019-07-01 DIAGNOSIS — Z79899 Other long term (current) drug therapy: Secondary | ICD-10-CM | POA: Diagnosis not present

## 2019-07-01 HISTORY — PX: CATARACT EXTRACTION W/PHACO: SHX586

## 2019-07-01 SURGERY — PHACOEMULSIFICATION, CATARACT, WITH IOL INSERTION
Anesthesia: Monitor Anesthesia Care | Site: Eye | Laterality: Right

## 2019-07-01 MED ORDER — NA CHONDROIT SULF-NA HYALURON 40-17 MG/ML IO SOLN
INTRAOCULAR | Status: DC | PRN
  Administered 2019-07-01: 1 mL via INTRAOCULAR

## 2019-07-01 MED ORDER — LIDOCAINE HCL (PF) 2 % IJ SOLN
INTRAOCULAR | Status: DC | PRN
  Administered 2019-07-01: 10:00:00 2 mL

## 2019-07-01 MED ORDER — TETRACAINE HCL 0.5 % OP SOLN
1.0000 [drp] | OPHTHALMIC | Status: DC | PRN
  Administered 2019-07-01 (×3): 1 [drp] via OPHTHALMIC

## 2019-07-01 MED ORDER — OXYCODONE HCL 5 MG/5ML PO SOLN: 5.0000 mg | Freq: Once | ORAL | Status: DC | PRN

## 2019-07-01 MED ORDER — MIDAZOLAM HCL 2 MG/2ML IJ SOLN
INTRAMUSCULAR | Status: DC | PRN
  Administered 2019-07-01: .5 mg via INTRAVENOUS

## 2019-07-01 MED ORDER — EPINEPHRINE PF 1 MG/ML IJ SOLN
INTRAOCULAR | Status: DC | PRN
  Administered 2019-07-01: 91 mL via OPHTHALMIC

## 2019-07-01 MED ORDER — MOXIFLOXACIN HCL 0.5 % OP SOLN
OPHTHALMIC | Status: DC | PRN
  Administered 2019-07-01: 0.2 mL via OPHTHALMIC

## 2019-07-01 MED ORDER — OXYCODONE HCL 5 MG PO TABS: 5.0000 mg | ORAL_TABLET | Freq: Once | ORAL | Status: DC | PRN

## 2019-07-01 MED ORDER — LACTATED RINGERS IV SOLN: INTRAVENOUS | Status: DC

## 2019-07-01 MED ORDER — ARMC OPHTHALMIC DILATING DROPS
1.0000 "application " | OPHTHALMIC | Status: DC | PRN
  Administered 2019-07-01 (×3): 1 via OPHTHALMIC

## 2019-07-01 MED ORDER — FENTANYL CITRATE (PF) 100 MCG/2ML IJ SOLN
INTRAMUSCULAR | Status: DC | PRN
  Administered 2019-07-01: 50 ug via INTRAVENOUS

## 2019-07-01 SURGICAL SUPPLY — 19 items
CANNULA ANT/CHMB 27G (MISCELLANEOUS) ×2 IMPLANT
CANNULA ANT/CHMB 27GA (MISCELLANEOUS) ×6 IMPLANT
GLOVE SURG LX 8.0 MICRO (GLOVE) ×2
GLOVE SURG LX STRL 8.0 MICRO (GLOVE) ×1 IMPLANT
GLOVE SURG TRIUMPH 8.0 PF LTX (GLOVE) ×3 IMPLANT
GOWN STRL REUS W/ TWL LRG LVL3 (GOWN DISPOSABLE) ×2 IMPLANT
GOWN STRL REUS W/TWL LRG LVL3 (GOWN DISPOSABLE) ×4
LENS IOL TECNIS ITEC 22.5 (Intraocular Lens) ×2 IMPLANT
MARKER SKIN DUAL TIP RULER LAB (MISCELLANEOUS) ×3 IMPLANT
NDL FILTER BLUNT 18X1 1/2 (NEEDLE) ×1 IMPLANT
NEEDLE FILTER BLUNT 18X 1/2SAF (NEEDLE) ×2
NEEDLE FILTER BLUNT 18X1 1/2 (NEEDLE) ×1 IMPLANT
PACK EYE AFTER SURG (MISCELLANEOUS) ×3 IMPLANT
PACK OPTHALMIC (MISCELLANEOUS) ×3 IMPLANT
PACK PORFILIO (MISCELLANEOUS) ×3 IMPLANT
SYR 3ML LL SCALE MARK (SYRINGE) ×3 IMPLANT
SYR TB 1ML LUER SLIP (SYRINGE) ×3 IMPLANT
WATER STERILE IRR 250ML POUR (IV SOLUTION) ×3 IMPLANT
WIPE NON LINTING 3.25X3.25 (MISCELLANEOUS) ×3 IMPLANT

## 2019-07-01 NOTE — Anesthesia Procedure Notes (Signed)
Procedure Name: MAC Performed by: Jazzalyn Loewenstein, CRNA Pre-anesthesia Checklist: Patient identified, Emergency Drugs available, Suction available, Timeout performed and Patient being monitored Patient Re-evaluated:Patient Re-evaluated prior to induction Oxygen Delivery Method: Nasal cannula Placement Confirmation: positive ETCO2       

## 2019-07-01 NOTE — Transfer of Care (Signed)
Immediate Anesthesia Transfer of Care Note  Patient: [name]  Procedure(s) Performed: CATARACT EXTRACTION PHACO AND INTRAOCULAR LENS PLACEMENT (IOC) RIGHT 9.25 01:14.7  (Right Eye)  Patient Location: PACU  Anesthesia Type: MAC  Level of Consciousness: awake, alert  and patient cooperative  Airway and Oxygen Therapy: Patient Spontanous Breathing and Patient connected to supplemental oxygen  Post-op Assessment: Post-op Vital signs reviewed, Patient's Cardiovascular Status Stable, Respiratory Function Stable, Patent Airway and No signs of Nausea or vomiting  Post-op Vital Signs: Reviewed and stable  Complications: No apparent anesthesia complications

## 2019-07-01 NOTE — Op Note (Signed)
PREOPERATIVE DIAGNOSIS:  Nuclear sclerotic cataract of the right eye.   POSTOPERATIVE DIAGNOSIS:  H25.11 Cataract   OPERATIVE PROCEDURE:@   SURGEON:  Birder Robson, MD.   ANESTHESIA:  Anesthesiologist: Fidel Levy, MD CRNA: Mayme Genta, CRNA  1.      Managed anesthesia care. 2.      0.39ml of Shugarcaine was instilled in the eye following the paracentesis.   COMPLICATIONS:  None.   TECHNIQUE:   Stop and chop   DESCRIPTION OF PROCEDURE:  The patient was examined and consented in the preoperative holding area where the aforementioned topical anesthesia was applied to the right eye and then brought back to the Operating Room where the right eye was prepped and draped in the usual sterile ophthalmic fashion and a lid speculum was placed. A paracentesis was created with the side port blade and the anterior chamber was filled with viscoelastic. A near clear corneal incision was performed with the steel keratome. A continuous curvilinear capsulorrhexis was performed with a cystotome followed by the capsulorrhexis forceps. Hydrodissection and hydrodelineation were carried out with BSS on a blunt cannula. The lens was removed in a stop and chop  technique and the remaining cortical material was removed with the irrigation-aspiration handpiece. The capsular bag was inflated with viscoelastic and the Technis [id number]  lens was placed in the capsular bag without complication. The remaining viscoelastic was removed from the eye with the irrigation-aspiration handpiece. The wounds were hydrated. The anterior chamber was flushed with BSS and the eye was inflated to physiologic pressure. 0.35ml of Vigamox was placed in the anterior chamber. The wounds were found to be water tight. The eye was dressed with Combigan. The patient was given protective glasses to wear throughout the day and a shield with which to sleep tonight. The patient was also given drops with which to begin a drop regimen today and will  follow-up with me in one day. Implant Name Type Inv. Item Serial No. Manufacturer Lot No. LRB No. Used Action  LENS IOL DIOP 22.5 - [id number] Intraocular Lens LENS IOL DIOP 22.5 [id number]  Right 1 Implanted   Procedure(s): CATARACT EXTRACTION PHACO AND INTRAOCULAR LENS PLACEMENT (IOC) RIGHT 9.25 01:14.7  (Right)  Electronically signed: Birder Robson 07/01/2019 9:53 AM

## 2019-07-01 NOTE — Anesthesia Preprocedure Evaluation (Signed)
Anesthesia Evaluation  Patient identified by MRN, date of birth, ID band Patient awake    Reviewed: Allergy & Precautions, NPO status , Patient's Chart, lab work & pertinent test results  History of Anesthesia Complications Negative for: history of anesthetic complications  Airway Mallampati: II  TM Distance: >3 FB Neck ROM: full    Dental no notable dental hx.    Pulmonary asthma (mild) ,    Pulmonary exam normal        Cardiovascular Exercise Tolerance: Good hypertension, Normal cardiovascular exam+ Valvular Problems/Murmurs (mild to mod AS) AS   echo: 2017: - Left ventricle: There was moderate concentric hypertrophy.  Systolic function was normal. The estimated ejection fraction was  in the range of 55% to 65%.  - Aortic valve: There was mild to moderate stenosis. There was  trivial regurgitation. Valve area (VTI): 1.5 cm^2. Valve area  (Vmax): 1.34 cm^2. Valve area (Vmean): 1.16 cm^2.  - Mitral valve: There was mild regurgitation. ;    Neuro/Psych Anxiety negative neurological ROS     GI/Hepatic Neg liver ROS, GERD  Controlled,  Endo/Other  negative endocrine ROS  Renal/GU Renal stent : 15 years ago   negative genitourinary   Musculoskeletal negative musculoskeletal ROS (+)   Abdominal   Peds negative pediatric ROS (+)  Hematology negative hematology ROS (+)  Cellulitis and abscess of leg . 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  Reproductive/Obstetrics negative OB ROS                             Anesthesia Physical Anesthesia Plan  ASA: III  Anesthesia Plan: MAC   Post-op Pain Management:    Induction:   PONV Risk Score and Plan: 2 and TIVA and Midazolam  Airway Management Planned:   Additional Equipment:   Intra-op Plan:   Post-operative Plan:   Informed Consent: I have reviewed the patients  History and Physical, chart, labs and discussed the procedure including the risks, benefits and alternatives for the proposed anesthesia with the patient or authorized representative who has indicated his/her understanding and acceptance.       Plan Discussed with: CRNA  Anesthesia Plan Comments:         Anesthesia Quick Evaluation

## 2019-07-01 NOTE — H&P (Signed)
All labs reviewed. Abnormal studies sent to patients PCP when indicated.  Previous H&P reviewed, patient examined, there are NO CHANGES.  Ashley Koslow Porfilio2/2/20219:21 AM

## 2019-07-01 NOTE — Anesthesia Postprocedure Evaluation (Signed)
Anesthesia Post Note  Patient: [name]  Procedure(s) Performed: CATARACT EXTRACTION PHACO AND INTRAOCULAR LENS PLACEMENT (IOC) RIGHT 9.25 01:14.7  (Right Eye)     Patient location during evaluation: PACU Anesthesia Type: MAC Level of consciousness: awake and alert Pain management: pain level controlled Vital Signs Assessment: post-procedure vital signs reviewed and stable Respiratory status: spontaneous breathing, nonlabored ventilation, respiratory function stable and patient connected to nasal cannula oxygen Cardiovascular status: stable and blood pressure returned to baseline Postop Assessment: no apparent nausea or vomiting Anesthetic complications: no    Lagretta Loseke

## 2019-07-02 ENCOUNTER — Encounter: Payer: Self-pay | Admitting: *Deleted

## 2019-07-07 ENCOUNTER — Other Ambulatory Visit: Payer: Self-pay

## 2019-07-07 MED ORDER — AMLODIPINE BESYLATE 5 MG PO TABS: ORAL_TABLET | ORAL | 1 refills | Status: DC

## 2019-07-18 ENCOUNTER — Telehealth: Payer: Self-pay

## 2019-07-18 NOTE — Telephone Encounter (Signed)
PRIOR AUTHORIZATION FOR ESTRADIOL TAB HAS BEEN APPROVED.Marland KitchenVALID THROUGH 05/28/2020/TAT

## 2019-07-25 ENCOUNTER — Telehealth: Payer: Self-pay

## 2019-07-25 NOTE — Telephone Encounter (Signed)
CONFIRMED AND SCREENED FOR 07-29-19 OV.

## 2019-07-29 ENCOUNTER — Other Ambulatory Visit: Payer: Self-pay | Admitting: Nurse Practitioner

## 2019-07-29 ENCOUNTER — Ambulatory Visit: Payer: Medicare Other | Admitting: Nurse Practitioner

## 2019-07-29 ENCOUNTER — Other Ambulatory Visit: Payer: Self-pay

## 2019-07-29 ENCOUNTER — Encounter: Payer: Self-pay | Admitting: Nurse Practitioner

## 2019-07-29 VITALS — BP 193/73 | HR 77 | Temp 97.5°F | Resp 16 | Ht 60.0 in | Wt 107.6 lb

## 2019-07-29 DIAGNOSIS — R634 Abnormal weight loss: Secondary | ICD-10-CM

## 2019-07-29 DIAGNOSIS — D0470 Carcinoma in situ of skin of unspecified lower limb, including hip: Secondary | ICD-10-CM | POA: Diagnosis not present

## 2019-07-29 DIAGNOSIS — I10 Essential (primary) hypertension: Secondary | ICD-10-CM | POA: Diagnosis not present

## 2019-07-29 DIAGNOSIS — F411 Generalized anxiety disorder: Secondary | ICD-10-CM | POA: Diagnosis not present

## 2019-07-29 NOTE — Progress Notes (Signed)
[facility] [place],  [address]  Internal MEDICINE  Office Visit Note  Patient Name: [name]  [id number]  [id number]  Date of Service: 07/30/2019  Chief Complaint  Patient presents with  . Hypertension  . Asthma    The patient is here for follow up. Blood pressure elevated. Taking all medication as prescribed. Insurance fighting prescription fill of irbesartan. They do not want to cover this medication any longer. They ask that we find a cheaper medication or one that is covered by the plan. She has been on multiple medications until we finally got the current medication combination which somewhat controls the blood pressure. In the past, she has been on lisinopril, losartan, Losartan/HCTZ, bisoprolol, clonidine. Currently she takes amlodipine, labetalol, irbesartan, and clonidine. This is the first combination which mildly controls blood pressure. She has been on this combination since 2012, It would adversely effect her health to change this combination at this time.  She continues to have ignificant skin lesions and breakdown of the skin of both lower legs. This is more severe on the right leg than the left. She was given prescription for doxycycline 100mg  twice daily to take until she was able to see her dermatologist. She stopped taking it after several days. Feels like it did help, but thought prescription was for too long. She has many -pills left in the prescription. She does have upcoming appointment with Dr. Alvera Novel, dermatology.       Current Medication: Outpatient Encounter Medications as of 07/29/2019  Medication Sig  . Acetaminophen (TYLENOL PO) Take by mouth as needed.  Marland Kitchen amLODipine (NORVASC) 5 MG tablet TAKE 1 TABLET BY MOUTH AT BEDTIME FOR HYPERTENSION  . Apoaequorin (PREVAGEN PO) Take by mouth daily.  . Ascorbic Acid (VITAMIN C) 1000 MG tablet Take 1,000 mg by mouth daily.  Marland Kitchen aspirin EC 81 MG tablet Take 81 mg by mouth daily.   . Biotin 5000 MCG TABS Take by mouth daily.  . calcium-vitamin D (OSCAL WITH D) 500-200 MG-UNIT tablet Take 1 tablet by mouth daily with breakfast.  . cholecalciferol (VITAMIN D) 1000 units tablet Take 1,000 Units by mouth daily.  . clobetasol cream (TEMOVATE) 0.05 % Apply twice a day to legs  . clonazePAM (KLONOPIN) 1 MG tablet Take 1 tablet (1 mg total) by mouth at bedtime as needed for anxiety.  . cloNIDine (CATAPRES) 0.1 MG tablet Take 1 tablet (0.1 mg total) by mouth 2 (two) times daily.  Marland Kitchen estradiol (ESTRACE) 0.5 MG tablet TAKE 1 TABLET(S) BY MOUTH DAILY  . irbesartan (AVAPRO) 150 MG tablet TAKE ONE TAB BY MOUTH TWICE A DAY  . labetalol (NORMODYNE) 100 MG tablet Take 1.5 tablets (150 mg total) by mouth 2 (two) times daily.  . montelukast (SINGULAIR) 10 MG tablet Take 1 tablet (10 mg total) by mouth daily.  . Multiple Vitamins-Minerals (PRESERVISION AREDS 2 PO) Take by mouth daily.  Vladimir Faster Glycol-Propyl Glycol (SYSTANE OP) Apply to eye daily.  Marland Kitchen PROAIR HFA 108 (90 Base) MCG/ACT inhaler Inhale 2 puffs into the lungs every 4 (four) hours as needed.  . Probiotic Product (PROBIOTIC DAILY PO) Take by mouth.  . triamcinolone cream (KENALOG) 0.1 % Apply 1 application topically 2 (two) times daily.  . vitamin B-12 (CYANOCOBALAMIN) 1000 MCG tablet Take 1,000 mcg by mouth daily.  Marland Kitchen VITAMIN E PO Take by mouth daily.  . [DISCONTINUED] doxycycline (VIBRA-TABS) 100 MG tablet Take 1 tablet (100 mg total) by mouth 2 (two) times daily. (Patient  not taking: Reported on 06/24/2019)  . [DISCONTINUED] mupirocin ointment (BACTROBAN) 2 % Apply small amount to affected area twice daily. (Patient not taking: Reported on 07/29/2019)  . [DISCONTINUED] phenazopyridine (PYRIDIUM) 200 MG tablet Take one tab po bid for baldder prn (Patient not taking: Reported on 06/24/2019)   No facility-administered encounter medications on file as of 07/29/2019.    Surgical History: Past Surgical History:  Procedure Laterality  Date  . ABDOMINAL HYSTERECTOMY    . CATARACT EXTRACTION W/PHACO Right 07/01/2019   Procedure: CATARACT EXTRACTION PHACO AND INTRAOCULAR LENS PLACEMENT (IOC) RIGHT 9.25 01:14.7 ;  Surgeon: Birder Robson, MD;  Location: [facility];  Service: Ophthalmology;  Laterality: Right;  . COLONOSCOPY    . RENAL ARTERY STENT    . TONSILLECTOMY      Medical History: Past Medical History:  Diagnosis Date  . Asthma    in past  . Heart murmur    Pt states she has Heart Murmur  . Hypertension   . Lower leg injury 04/24/2016   08/06/17- pt reports legs still not completely healed  . Renal insufficiency    10%-Right, 90%-Left (per pt)  . Skin cancer     Family History: Family History  Problem Relation Age of Onset  . Hypertension Mother     Social History   Socioeconomic History  . Marital status: Widowed    Spouse name: Not on file  . Number of children: Not on file  . Years of education: Not on file  . Highest education level: Not on file  Occupational History  . Not on file  Tobacco Use  . Smoking status: Never Smoker  . Smokeless tobacco: Never Used  Substance and Sexual Activity  . Alcohol use: No  . Drug use: No  . Sexual activity: Not on file  Other Topics Concern  . Not on file  Social History Narrative  . Not on file   Social Determinants of Health   Financial Resource Strain:   . Difficulty of Paying Living Expenses: Not on file  Food Insecurity:   . Worried About Charity fundraiser in the Last Year: Not on file  . Ran Out of Food in the Last Year: Not on file  Transportation Needs:   . Lack of Transportation (Medical): Not on file  . Lack of Transportation (Non-Medical): Not on file  Physical Activity:   . Days of Exercise per Week: Not on file  . Minutes of Exercise per Session: Not on file  Stress:   . Feeling of Stress : Not on file  Social Connections:   . Frequency of Communication with Friends and Family: Not on file  . Frequency of Social  Gatherings with Friends and Family: Not on file  . Attends Religious Services: Not on file  . Active Member of Clubs or Organizations: Not on file  . Attends Archivist Meetings: Not on file  . Marital Status: Not on file  Intimate Partner Violence:   . Fear of Current or Ex-Partner: Not on file  . Emotionally Abused: Not on file  . Physically Abused: Not on file  . Sexually Abused: Not on file      Review of Systems  Constitutional: Negative for activity change, chills, fatigue and unexpected weight change.  HENT: Negative for congestion, postnasal drip, rhinorrhea, sneezing and sore throat.   Respiratory: Negative for cough, chest tightness, shortness of breath and wheezing.   Cardiovascular: Negative for chest pain and palpitations.  Moderately elevated blood pressure. Patient has not been able to take afternoon dose of clonidine due to the fatigue it causes.   Gastrointestinal: Negative for abdominal pain, constipation, diarrhea, nausea and vomiting.  Endocrine: Negative for cold intolerance, heat intolerance, polydipsia and polyuria.  Musculoskeletal: Positive for arthralgias and myalgias. Negative for back pain, joint swelling and neck pain.  Skin: Negative for rash.       Chronic skin ulceration and infection of bilateral lower extremities. There are open areas present with drainaige. More severe on right leg than left.   Allergic/Immunologic: Negative for environmental allergies.  Neurological: Negative for dizziness, tremors, numbness and headaches.  Hematological: Negative for adenopathy. Does not bruise/bleed easily.  Psychiatric/Behavioral: Negative for behavioral problems (Depression), sleep disturbance and suicidal ideas. The patient is nervous/anxious.     Today's Vitals   07/29/19 1502  BP: (!) 193/73  Pulse: 77  Resp: 16  Temp: (!) 97.5 F (36.4 C)  SpO2: 99%  Weight: 107 lb 9.6 oz (48.8 kg)   Body mass index is 21.01 kg/m.  Physical  Exam Vitals and nursing note reviewed.  Constitutional:      General: She is not in acute distress.    Appearance: Normal appearance. She is well-developed. She is not diaphoretic.  HENT:     Head: Normocephalic and atraumatic.     Mouth/Throat:     Pharynx: No oropharyngeal exudate.  Eyes:     Pupils: Pupils are equal, round, and reactive to light.  Neck:     Thyroid: No thyromegaly.     Vascular: No JVD.     Trachea: No tracheal deviation.  Cardiovascular:     Rate and Rhythm: Normal rate and regular rhythm.     Heart sounds: Normal heart sounds. No murmur. No friction rub. No gallop.   Pulmonary:     Effort: Pulmonary effort is normal. No respiratory distress.     Breath sounds: Normal breath sounds. No wheezing or rales.  Chest:     Chest wall: No tenderness.  Abdominal:     Palpations: Abdomen is soft.  Musculoskeletal:        General: Normal range of motion.     Cervical back: Normal range of motion and neck supple.  Lymphadenopathy:     Cervical: No cervical adenopathy.  Skin:    General: Skin is warm and dry.     Findings: Erythema and lesion present.     Comments: Redness, pain, and swelling pf both lower legs. Right leg much more effected than left at this time. There is peeling and breakdown present of the right lower leg. Tender to palpate. Some serosanguinous fluid present from some of the lesions.    Neurological:     General: No focal deficit present.     Mental Status: She is alert and oriented to person, place, and time. Mental status is at baseline.     Cranial Nerves: No cranial nerve deficit.  Psychiatric:        Behavior: Behavior normal.        Thought Content: Thought content normal.        Judgment: Judgment normal.    Assessment/Plan: 1. Hypertension, poor control Blood pressure historically difficult to control. She has been on multiple medications until we finally got the current medication combination which somewhat controls the blood  pressure. In the past, she has been on lisinopril, losartan, Losartan/HCTZ, bisoprolol, clonidine. Currently she takes amlodipine, labetalol, irbesartan, and clonidine. This is the first combination which mildly  controls blood pressure. She has been on this combination since 2012, It would adversely effect her health to change this combination at this time.   2. 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   3. Abnormal weight loss Check labs including thyroid and anemia panels for further evaluation.   4. GAD (generalized anxiety disorder) May take clonazepam as needed and as prescribed.   General Counseling: Jesiah verbalizes understanding of the findings of todays visit and agrees with plan of treatment. I have discussed any further diagnostic evaluation that may be needed or ordered today. We also reviewed her medications today. she has been encouraged to call the office with any questions or concerns that should arise related to todays visit.   Hypertension Counseling:   The following hypertensive lifestyle modification were recommended and discussed:  1. Limiting alcohol intake to less than 1 oz/day of ethanol:(24 oz of beer or 8 oz of wine or 2 oz of 100-proof whiskey). 2. Take baby ASA 81 mg daily. 3. Importance of regular aerobic exercise and losing weight. 4. Reduce dietary saturated fat and cholesterol intake for overall cardiovascular health. 5. Maintaining adequate dietary potassium, calcium, and magnesium intake. 6. Regular monitoring of the blood pressure. 7. Reduce sodium intake to less than 100 mmol/day (less than 2.3 gm of sodium or less than 6 gm of sodium choride)   This patient was seen by [place] with Dr [name] as a part of collaborative care agreement   Total time spent: 78 Minutes   Time spent includes review of chart,  medications, test results, and follow up plan with the patient.      Dr [name] Internal medicine

## 2019-07-30 ENCOUNTER — Telehealth: Payer: Self-pay | Admitting: Nurse Practitioner

## 2019-07-30 DIAGNOSIS — R634 Abnormal weight loss: Secondary | ICD-10-CM | POA: Insufficient documentation

## 2019-07-30 LAB — VITAMIN D 25 HYDROXY (VIT D DEFICIENCY, FRACTURES): Vit D, 25-Hydroxy: 44 ng/mL (ref 30.0–100.0)

## 2019-07-30 LAB — LIPID PANEL W/O CHOL/HDL RATIO
Cholesterol, Total: 163 mg/dL (ref 100–199)
HDL: 61 mg/dL (ref >39)
LDL Chol Calc (NIH): 84 mg/dL (ref 0–99)
Triglycerides: 99 mg/dL (ref 0–149)
VLDL Cholesterol Cal: 18 mg/dL (ref 5–40)

## 2019-07-30 LAB — IRON AND TIBC
Iron Saturation: 25 % (ref 15–55)
Iron: 76 ug/dL (ref 27–139)
Total Iron Binding Capacity: 310 ug/dL (ref 250–450)
UIBC: 234 ug/dL (ref 118–369)

## 2019-07-30 LAB — COMPREHENSIVE METABOLIC PANEL
ALT: 12 IU/L (ref 0–32)
AST: 19 IU/L (ref 0–40)
Albumin/Globulin Ratio: 2 (ref 1.2–2.2)
Albumin: 4.3 g/dL (ref 3.6–4.6)
Alkaline Phosphatase: 113 IU/L (ref 39–117)
BUN/Creatinine Ratio: 19 (ref 12–28)
BUN: 17 mg/dL (ref 8–27)
Bilirubin Total: 0.3 mg/dL (ref 0.0–1.2)
CO2: 25 mmol/L (ref 20–29)
Calcium: 9.9 mg/dL (ref 8.7–10.3)
Chloride: 105 mmol/L (ref 96–106)
Creatinine, Ser: 0.89 mg/dL (ref 0.57–1.00)
GFR calc Af Amer: 67 mL/min/{1.73_m2} (ref >59)
GFR calc non Af Amer: 58 mL/min/{1.73_m2} — ABNORMAL LOW (ref >59)
Globulin, Total: 2.1 g/dL (ref 1.5–4.5)
Glucose: 97 mg/dL (ref 65–99)
Potassium: 4.6 mmol/L (ref 3.5–5.2)
Sodium: 141 mmol/L (ref 134–144)
Total Protein: 6.4 g/dL (ref 6.0–8.5)

## 2019-07-30 LAB — CBC
Hematocrit: 32 % — ABNORMAL LOW (ref 34.0–46.6)
Hemoglobin: 10.6 g/dL — ABNORMAL LOW (ref 11.1–15.9)
MCH: 31.8 pg (ref 26.6–33.0)
MCHC: 33.1 g/dL (ref 31.5–35.7)
MCV: 96 fL (ref 79–97)
Platelets: 229 10*3/uL (ref 150–450)
RBC: 3.33 x10E6/uL — ABNORMAL LOW (ref 3.77–5.28)
RDW: 12.1 % (ref 11.7–15.4)
WBC: 6.4 10*3/uL (ref 3.4–10.8)

## 2019-07-30 LAB — TSH: TSH: 2.68 u[IU]/mL (ref 0.450–4.500)

## 2019-07-30 LAB — B12 AND FOLATE PANEL
Folate: 7.4 ng/mL (ref >3.0)
Vitamin B-12: >2000 pg/mL — ABNORMAL HIGH (ref 232–1245)

## 2019-07-30 LAB — T4, FREE: Free T4: 1 ng/dL (ref 0.82–1.77)

## 2019-07-30 LAB — FERRITIN: Ferritin: 70 ng/mL (ref 15–150)

## 2019-07-30 LAB — HGB A1C W/O EAG: Hgb A1c MFr Bld: 5.3 % (ref 4.8–5.6)

## 2019-07-30 NOTE — Progress Notes (Signed)
Waiting on some results. Review with patient when all results available

## 2019-08-05 ENCOUNTER — Other Ambulatory Visit: Payer: Self-pay | Admitting: Nurse Practitioner

## 2019-08-05 ENCOUNTER — Other Ambulatory Visit: Payer: Self-pay

## 2019-08-05 MED ORDER — IRBESARTAN 150 MG PO TABS: ORAL_TABLET | ORAL | 1 refills | Status: DC

## 2019-08-05 NOTE — Telephone Encounter (Signed)
We can call her insurance for PA due to pt unable to tolerate

## 2019-08-05 NOTE — Telephone Encounter (Signed)
Need PA

## 2019-08-06 NOTE — Telephone Encounter (Signed)
-----   Message from Ronnell Freshwater, NP sent at 08/06/2019 12:02 PM EST ----- Please let her know that she has very mild anemia, however, better than it has been in prior lab checks. All other labs were completely normal. Thanks.

## 2019-08-06 NOTE — Telephone Encounter (Signed)
Pt notified, requested copy of labs,labs will be mailed out today

## 2019-08-06 NOTE — Progress Notes (Signed)
Please let her know that she has very mild anemia, however, better than it has been in prior lab checks. All other labs were completely normal. Thanks.

## 2019-08-15 ENCOUNTER — Telehealth: Payer: Self-pay

## 2019-08-15 ENCOUNTER — Other Ambulatory Visit: Payer: Self-pay | Admitting: Nurse Practitioner

## 2019-08-15 DIAGNOSIS — I10 Essential (primary) hypertension: Secondary | ICD-10-CM

## 2019-08-15 MED ORDER — IRBESARTAN 300 MG PO TABS: 300.0000 mg | ORAL_TABLET | Freq: Every day | ORAL | 0 refills | Status: DC

## 2019-08-15 MED ORDER — IRBESARTAN 300 MG PO TABS: 300.0000 mg | ORAL_TABLET | Freq: Every day | ORAL | 3 refills | Status: DC

## 2019-08-15 NOTE — Telephone Encounter (Deleted)
Pt wanted to send rx to [facility] in [place] because it is cheaper.

## 2019-08-15 NOTE — Telephone Encounter (Signed)
Error

## 2019-08-15 NOTE — Telephone Encounter (Signed)
Pt wanted this sent to [facility] and I sent to [facility] in [place]. Pt was also notified to cut tab in 1/2 and take 1/2 tab  twice daily.

## 2019-08-15 NOTE — Progress Notes (Signed)
Changed irbesartan to 300mg  tablet daily. She can cut in half and take 1/2 tablet twice daily.

## 2019-08-15 NOTE — Telephone Encounter (Signed)
Changed irbesartan to 300mg  tablet daily. She can cut in half and take 1/2 tablet twice daily.

## 2019-08-15 NOTE — Telephone Encounter (Signed)
Pt prefers to use [facility] in [place]

## 2019-08-25 ENCOUNTER — Other Ambulatory Visit: Payer: Self-pay

## 2019-08-25 DIAGNOSIS — I10 Essential (primary) hypertension: Secondary | ICD-10-CM

## 2019-08-25 MED ORDER — LABETALOL HCL 100 MG PO TABS: 150.0000 mg | ORAL_TABLET | Freq: Two times a day (BID) | ORAL | 1 refills | Status: DC

## 2019-08-27 ENCOUNTER — Other Ambulatory Visit: Payer: Self-pay

## 2019-08-27 ENCOUNTER — Encounter: Payer: Self-pay | Admitting: Ophthalmology

## 2019-09-01 ENCOUNTER — Other Ambulatory Visit: Payer: Self-pay

## 2019-09-01 MED ORDER — CLONIDINE HCL 0.1 MG PO TABS: 0.1000 mg | ORAL_TABLET | Freq: Two times a day (BID) | ORAL | 5 refills | Status: DC

## 2019-09-04 NOTE — Anesthesia Preprocedure Evaluation (Addendum)
Anesthesia Evaluation  Patient identified by MRN, date of birth, ID band Patient awake    Reviewed: Allergy & Precautions, NPO status , Patient's Chart, lab work & pertinent test results, reviewed documented beta blocker date and time   History of Anesthesia Complications Negative for: history of anesthetic complications  Airway Mallampati: III  TM Distance: >3 FB Neck ROM: Limited    Dental no notable dental hx.    Pulmonary asthma (mild) ,    Pulmonary exam normal breath sounds clear to auscultation       Cardiovascular Exercise Tolerance: Poor hypertension, (-) angina+ Peripheral Vascular Disease and + DOE  Normal cardiovascular exam+ Valvular Problems/Murmurs (mild to mod AS) AS  Rhythm:Regular Rate:Normal  echo: 2017: - Left ventricle: There was moderate concentric hypertrophy.  Systolic function was normal. The estimated ejection fraction was  in the range of 55% to 65%.  - Aortic valve: There was mild to moderate stenosis. There was  trivial regurgitation. Valve area (VTI): 1.5 cm^2. Valve area  (Vmax): 1.34 cm^2. Valve area (Vmean): 1.16 cm^2.  - Mitral valve: There was mild regurgitation. ;    Neuro/Psych PSYCHIATRIC DISORDERS Anxiety    GI/Hepatic GERD  Controlled, Diverticulosis IBS   Endo/Other    Renal/GU CRFRenal diseaseRenal stent : 15 years ago      Musculoskeletal   Abdominal   Peds  Hematology  Cellulitis and abscess of leg . 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,   Anesthesia Other Findings Skin cancer  Reproductive/Obstetrics                            Anesthesia Physical  Anesthesia Plan  ASA: III  Anesthesia Plan: MAC   Post-op Pain Management:    Induction: Intravenous  PONV Risk Score and Plan: 2 and TIVA and Midazolam  Airway Management Planned: Nasal Cannula  Additional Equipment:   Intra-op Plan:   Post-operative  Plan:   Informed Consent: I have reviewed the patients History and Physical, chart, labs and discussed the procedure including the risks, benefits and alternatives for the proposed anesthesia with the patient or authorized representative who has indicated his/her understanding and acceptance.       Plan Discussed with: CRNA  Anesthesia Plan Comments:         Anesthesia Quick Evaluation

## 2019-09-05 ENCOUNTER — Other Ambulatory Visit
Admission: RE | Admit: 2019-09-05 | Discharge: 2019-09-05 | Disposition: A | Discharge status: Home / Self Care | Payer: Medicare Other | Source: Ambulatory Visit | Attending: Ophthalmology | Admitting: Ophthalmology

## 2019-09-05 ENCOUNTER — Other Ambulatory Visit: Payer: Self-pay

## 2019-09-05 DIAGNOSIS — Z20822 Contact with and (suspected) exposure to covid-19: Secondary | ICD-10-CM | POA: Diagnosis not present

## 2019-09-05 DIAGNOSIS — Z01812 Encounter for preprocedural laboratory examination: Secondary | ICD-10-CM | POA: Diagnosis present

## 2019-09-06 LAB — SARS CORONAVIRUS 2 (TAT 6-24 HRS): SARS Coronavirus 2: NEGATIVE

## 2019-09-08 NOTE — Discharge Instructions (Signed)

## 2019-09-09 ENCOUNTER — Ambulatory Visit
Admission: RE | Admit: 2019-09-09 | Discharge: 2019-09-09 | Disposition: A | Discharge status: Home / Self Care | Payer: Medicare Other | Attending: Ophthalmology | Admitting: Ophthalmology

## 2019-09-09 ENCOUNTER — Encounter
Admission: RE | Disposition: A | Discharge status: Home / Self Care | Payer: Self-pay | Source: Home / Self Care | Attending: Ophthalmology

## 2019-09-09 ENCOUNTER — Other Ambulatory Visit: Payer: Self-pay

## 2019-09-09 ENCOUNTER — Ambulatory Visit: Payer: Medicare Other | Admitting: Anesthesiology

## 2019-09-09 ENCOUNTER — Encounter: Payer: Self-pay | Admitting: Ophthalmology

## 2019-09-09 DIAGNOSIS — Z880 Allergy status to penicillin: Secondary | ICD-10-CM | POA: Diagnosis not present

## 2019-09-09 DIAGNOSIS — H2512 Age-related nuclear cataract, left eye: Secondary | ICD-10-CM | POA: Insufficient documentation

## 2019-09-09 DIAGNOSIS — Z79890 Hormone replacement therapy (postmenopausal): Secondary | ICD-10-CM | POA: Diagnosis not present

## 2019-09-09 DIAGNOSIS — Z7982 Long term (current) use of aspirin: Secondary | ICD-10-CM | POA: Insufficient documentation

## 2019-09-09 DIAGNOSIS — Z881 Allergy status to other antibiotic agents status: Secondary | ICD-10-CM | POA: Insufficient documentation

## 2019-09-09 DIAGNOSIS — F419 Anxiety disorder, unspecified: Secondary | ICD-10-CM | POA: Diagnosis not present

## 2019-09-09 DIAGNOSIS — J45909 Unspecified asthma, uncomplicated: Secondary | ICD-10-CM | POA: Diagnosis not present

## 2019-09-09 DIAGNOSIS — Z79899 Other long term (current) drug therapy: Secondary | ICD-10-CM | POA: Diagnosis not present

## 2019-09-09 DIAGNOSIS — I739 Peripheral vascular disease, unspecified: Secondary | ICD-10-CM | POA: Diagnosis not present

## 2019-09-09 DIAGNOSIS — I10 Essential (primary) hypertension: Secondary | ICD-10-CM | POA: Diagnosis not present

## 2019-09-09 HISTORY — PX: CATARACT EXTRACTION W/PHACO: SHX586

## 2019-09-09 SURGERY — PHACOEMULSIFICATION, CATARACT, WITH IOL INSERTION
Anesthesia: Monitor Anesthesia Care | Site: Eye | Laterality: Left

## 2019-09-09 MED ORDER — TETRACAINE HCL 0.5 % OP SOLN
1.0000 [drp] | OPHTHALMIC | Status: DC | PRN
  Administered 2019-09-09 (×3): 1 [drp] via OPHTHALMIC

## 2019-09-09 MED ORDER — FENTANYL CITRATE (PF) 100 MCG/2ML IJ SOLN
INTRAMUSCULAR | Status: DC | PRN
  Administered 2019-09-09 (×2): 25 ug via INTRAVENOUS

## 2019-09-09 MED ORDER — ARMC OPHTHALMIC DILATING DROPS
1.0000 "application " | OPHTHALMIC | Status: DC | PRN
  Administered 2019-09-09 (×3): 1 via OPHTHALMIC

## 2019-09-09 MED ORDER — MIDAZOLAM HCL 2 MG/2ML IJ SOLN
INTRAMUSCULAR | Status: DC | PRN
  Administered 2019-09-09: .5 mg via INTRAVENOUS

## 2019-09-09 MED ORDER — MOXIFLOXACIN HCL 0.5 % OP SOLN
OPHTHALMIC | Status: DC | PRN
  Administered 2019-09-09: 0.2 mL via OPHTHALMIC

## 2019-09-09 MED ORDER — LACTATED RINGERS IV SOLN: 100.0000 mL/h | INTRAVENOUS | Status: DC

## 2019-09-09 MED ORDER — BRIMONIDINE TARTRATE-TIMOLOL 0.2-0.5 % OP SOLN
OPHTHALMIC | Status: DC | PRN
  Administered 2019-09-09: 1 [drp] via OPHTHALMIC

## 2019-09-09 MED ORDER — ACETAMINOPHEN 10 MG/ML IV SOLN: 1000.0000 mg | Freq: Once | INTRAVENOUS | Status: DC | PRN

## 2019-09-09 MED ORDER — LIDOCAINE HCL (PF) 2 % IJ SOLN
INTRAOCULAR | Status: DC | PRN
  Administered 2019-09-09: 1 mL

## 2019-09-09 MED ORDER — ONDANSETRON HCL 4 MG/2ML IJ SOLN: 4.0000 mg | Freq: Once | INTRAMUSCULAR | Status: DC | PRN

## 2019-09-09 MED ORDER — NA CHONDROIT SULF-NA HYALURON 40-17 MG/ML IO SOLN
INTRAOCULAR | Status: DC | PRN
  Administered 2019-09-09: 1 mL via INTRAOCULAR

## 2019-09-09 SURGICAL SUPPLY — 21 items
CANNULA ANT/CHMB 27G (MISCELLANEOUS) ×2 IMPLANT
CANNULA ANT/CHMB 27GA (MISCELLANEOUS) ×6 IMPLANT
DISSECTOR HYDRO NUCLEUS 50X22 (MISCELLANEOUS) ×3 IMPLANT
GLOVE SURG LX 8.0 MICRO (GLOVE) ×2
GLOVE SURG LX STRL 8.0 MICRO (GLOVE) ×1 IMPLANT
GLOVE SURG TRIUMPH 8.0 PF LTX (GLOVE) ×3 IMPLANT
GOWN STRL REUS W/ TWL LRG LVL3 (GOWN DISPOSABLE) ×2 IMPLANT
GOWN STRL REUS W/TWL LRG LVL3 (GOWN DISPOSABLE) ×6
LENS IOL DIOP 22.5 (Intraocular Lens) ×3 IMPLANT
LENS IOL TECNIS MONO 22.5 (Intraocular Lens) IMPLANT
MARKER SKIN DUAL TIP RULER LAB (MISCELLANEOUS) ×3 IMPLANT
NDL FILTER BLUNT 18X1 1/2 (NEEDLE) ×1 IMPLANT
NEEDLE FILTER BLUNT 18X 1/2SAF (NEEDLE) ×2
NEEDLE FILTER BLUNT 18X1 1/2 (NEEDLE) ×1 IMPLANT
PACK EYE AFTER SURG (MISCELLANEOUS) ×3 IMPLANT
PACK OPTHALMIC (MISCELLANEOUS) ×3 IMPLANT
PACK PORFILIO (MISCELLANEOUS) ×3 IMPLANT
SYR 3ML LL SCALE MARK (SYRINGE) ×3 IMPLANT
SYR TB 1ML LUER SLIP (SYRINGE) ×3 IMPLANT
WATER STERILE IRR 250ML POUR (IV SOLUTION) ×3 IMPLANT
WIPE NON LINTING 3.25X3.25 (MISCELLANEOUS) ×3 IMPLANT

## 2019-09-09 NOTE — Anesthesia Procedure Notes (Signed)
Procedure Name: MAC Date/Time: 09/09/2019 12:22 PM Performed by: Vanetta Shawl, CRNA Pre-anesthesia Checklist: Patient identified, Emergency Drugs available, Suction available, Timeout performed and Patient being monitored Patient Re-evaluated:Patient Re-evaluated prior to induction Oxygen Delivery Method: Nasal cannula Placement Confirmation: positive ETCO2

## 2019-09-09 NOTE — Op Note (Signed)
PREOPERATIVE DIAGNOSIS:  Nuclear sclerotic cataract of the left eye.   POSTOPERATIVE DIAGNOSIS:  Nuclear sclerotic cataract of the left eye.   OPERATIVE PROCEDURE:@   SURGEON:  Birder Robson, MD.   ANESTHESIA:  Anesthesiologist: Heniser, Fredric Dine, MD CRNA: Vanetta Shawl, CRNA  1.      Managed anesthesia care. 2.     0.6ml of Shugarcaine was instilled following the paracentesis   COMPLICATIONS:  None.   TECHNIQUE:   Stop and chop   DESCRIPTION OF PROCEDURE:  The patient was examined and consented in the preoperative holding area where the aforementioned topical anesthesia was applied to the left eye and then brought back to the Operating Room where the left eye was prepped and draped in the usual sterile ophthalmic fashion and a lid speculum was placed. A paracentesis was created with the side port blade and the anterior chamber was filled with viscoelastic. A near clear corneal incision was performed with the steel keratome. A continuous curvilinear capsulorrhexis was performed with a cystotome followed by the capsulorrhexis forceps. Hydrodissection and hydrodelineation were carried out with BSS on a blunt cannula. The lens was removed in a stop and chop  technique and the remaining cortical material was removed with the irrigation-aspiration handpiece. The capsular bag was inflated with viscoelastic and the Technis [id number] lens was placed in the capsular bag without complication. The remaining viscoelastic was removed from the eye with the irrigation-aspiration handpiece. The wounds were hydrated. The anterior chamber was flushed with BSS and the eye was inflated to physiologic pressure. 0.58ml Vigamox was placed in the anterior chamber. The wounds were found to be water tight. The eye was dressed with Combigan. The patient was given protective glasses to wear throughout the day and a shield with which to sleep tonight. The patient was also given drops with which to begin a drop regimen today  and will follow-up with me in one day. Implant Name Type Inv. Item Serial No. Manufacturer Lot No. LRB No. Used Action  LENS IOL DIOP 22.5 - [id number] Intraocular Lens LENS IOL DIOP 22.5 [id number]  Left 1 Implanted    Procedure(s): CATARACT EXTRACTION PHACO AND INTRAOCULAR LENS PLACEMENT (IOC) LEFT 8.29  01:03.3 (Left)  Electronically signed: Birder Robson 09/09/2019 12:40 PM

## 2019-09-09 NOTE — Transfer of Care (Signed)
Immediate Anesthesia Transfer of Care Note  Patient: [name]  Procedure(s) Performed: CATARACT EXTRACTION PHACO AND INTRAOCULAR LENS PLACEMENT (IOC) LEFT 8.29  01:03.3 (Left Eye)  Patient Location: PACU  Anesthesia Type: MAC  Level of Consciousness: awake, alert  and patient cooperative  Airway and Oxygen Therapy: Patient Spontanous Breathing   Post-op Assessment: Post-op Vital signs reviewed, Patient's Cardiovascular Status Stable, Respiratory Function Stable, Patent Airway and No signs of Nausea or vomiting  Post-op Vital Signs: Reviewed and stable  Complications: No apparent anesthesia complications

## 2019-09-09 NOTE — H&P (Signed)
All labs reviewed. Abnormal studies sent to patients PCP when indicated.  Previous H&P reviewed, patient examined, there are NO CHANGES.  Ashley Mcquade Porfilio4/13/202111:54 AM

## 2019-09-09 NOTE — Anesthesia Postprocedure Evaluation (Signed)
Anesthesia Post Note  Patient: [name]  Procedure(s) Performed: CATARACT EXTRACTION PHACO AND INTRAOCULAR LENS PLACEMENT (IOC) LEFT 8.29  01:03.3 (Left Eye)     Patient location during evaluation: PACU Anesthesia Type: MAC Level of consciousness: awake and alert Pain management: pain level controlled Vital Signs Assessment: post-procedure vital signs reviewed and stable Respiratory status: spontaneous breathing, nonlabored ventilation, respiratory function stable and patient connected to nasal cannula oxygen Cardiovascular status: stable and blood pressure returned to baseline Postop Assessment: no apparent nausea or vomiting Anesthetic complications: no    Meiko Stranahan A  Chandi Nicklin

## 2019-09-10 ENCOUNTER — Encounter: Payer: Self-pay | Admitting: *Deleted

## 2019-12-11 ENCOUNTER — Other Ambulatory Visit: Payer: Self-pay

## 2019-12-11 ENCOUNTER — Emergency Department: Payer: Medicare Other

## 2019-12-11 ENCOUNTER — Emergency Department
Admission: EM | Admit: 2019-12-11 | Discharge: 2019-12-11 | Disposition: A | Discharge status: Home / Self Care | Payer: Medicare Other | Attending: Student in an Organized Health Care Education/Training Program | Admitting: Student in an Organized Health Care Education/Training Program

## 2019-12-11 DIAGNOSIS — Z880 Allergy status to penicillin: Secondary | ICD-10-CM | POA: Insufficient documentation

## 2019-12-11 DIAGNOSIS — I480 Paroxysmal atrial fibrillation: Secondary | ICD-10-CM | POA: Diagnosis not present

## 2019-12-11 DIAGNOSIS — R079 Chest pain, unspecified: Secondary | ICD-10-CM

## 2019-12-11 DIAGNOSIS — R918 Other nonspecific abnormal finding of lung field: Secondary | ICD-10-CM | POA: Diagnosis not present

## 2019-12-11 DIAGNOSIS — Z79899 Other long term (current) drug therapy: Secondary | ICD-10-CM | POA: Insufficient documentation

## 2019-12-11 DIAGNOSIS — R0602 Shortness of breath: Secondary | ICD-10-CM | POA: Diagnosis not present

## 2019-12-11 DIAGNOSIS — Z8249 Family history of ischemic heart disease and other diseases of the circulatory system: Secondary | ICD-10-CM | POA: Diagnosis not present

## 2019-12-11 DIAGNOSIS — J45909 Unspecified asthma, uncomplicated: Secondary | ICD-10-CM | POA: Insufficient documentation

## 2019-12-11 DIAGNOSIS — I10 Essential (primary) hypertension: Secondary | ICD-10-CM | POA: Insufficient documentation

## 2019-12-11 DIAGNOSIS — Z7982 Long term (current) use of aspirin: Secondary | ICD-10-CM | POA: Diagnosis not present

## 2019-12-11 DIAGNOSIS — R0789 Other chest pain: Secondary | ICD-10-CM | POA: Diagnosis present

## 2019-12-11 DIAGNOSIS — Z85828 Personal history of other malignant neoplasm of skin: Secondary | ICD-10-CM | POA: Diagnosis not present

## 2019-12-11 DIAGNOSIS — Z7901 Long term (current) use of anticoagulants: Secondary | ICD-10-CM | POA: Diagnosis not present

## 2019-12-11 LAB — CBC
HCT: 30.9 % — ABNORMAL LOW (ref 36.0–46.0)
Hemoglobin: 10.5 g/dL — ABNORMAL LOW (ref 12.0–15.0)
MCH: 31.5 pg (ref 26.0–34.0)
MCHC: 34 g/dL (ref 30.0–36.0)
MCV: 92.8 fL (ref 80.0–100.0)
Platelets: 247 10*3/uL (ref 150–400)
RBC: 3.33 MIL/uL — ABNORMAL LOW (ref 3.87–5.11)
RDW: 12.8 % (ref 11.5–15.5)
WBC: 7.6 10*3/uL (ref 4.0–10.5)
nRBC: 0 % (ref 0.0–0.2)

## 2019-12-11 LAB — BASIC METABOLIC PANEL
Anion gap: 7 (ref 5–15)
BUN: 19 mg/dL (ref 8–23)
CO2: 26 mmol/L (ref 22–32)
Calcium: 9.6 mg/dL (ref 8.9–10.3)
Chloride: 109 mmol/L (ref 98–111)
Creatinine, Ser: 1.01 mg/dL — ABNORMAL HIGH (ref 0.44–1.00)
GFR calc Af Amer: 58 mL/min — ABNORMAL LOW (ref >60)
GFR calc non Af Amer: 50 mL/min — ABNORMAL LOW (ref >60)
Glucose, Bld: 109 mg/dL — ABNORMAL HIGH (ref 70–99)
Potassium: 3.8 mmol/L (ref 3.5–5.1)
Sodium: 142 mmol/L (ref 135–145)

## 2019-12-11 LAB — MAGNESIUM: Magnesium: 1.8 mg/dL (ref 1.7–2.4)

## 2019-12-11 LAB — BRAIN NATRIURETIC PEPTIDE: B Natriuretic Peptide: 227.9 pg/mL — ABNORMAL HIGH (ref 0.0–100.0)

## 2019-12-11 LAB — TROPONIN I (HIGH SENSITIVITY): Troponin I (High Sensitivity): 12 ng/L (ref <18)

## 2019-12-11 MED ORDER — DILTIAZEM LOAD VIA INFUSION
10.0000 mg | Freq: Once | INTRAVENOUS | Status: AC
  Administered 2019-12-11: 10 mg via INTRAVENOUS
  Filled 2019-12-11: qty 10

## 2019-12-11 MED ORDER — DILTIAZEM HCL-DEXTROSE 125-5 MG/125ML-% IV SOLN (PREMIX)
5.0000 mg/h | INTRAVENOUS | Status: DC
  Administered 2019-12-11: 5 mg/h via INTRAVENOUS

## 2019-12-11 MED ORDER — APIXABAN 2.5 MG PO TABS
2.5000 mg | ORAL_TABLET | Freq: Two times a day (BID) | ORAL | 1 refills | Status: DC
Start: 2019-12-11 — End: 2020-02-06

## 2019-12-11 NOTE — Consult Note (Signed)
Cardiology Consultation:   Patient ID: [name] MRN: [id number]; DOB: 1928/07/25  Admit date: 12/11/2019 Date of Consult: 12/11/2019  Primary Care Provider: [name], MD [facility] Cardiologist: New to [facility] Physician requesting consult: Dr. Merlyn Lot Reason for consult: Atrial fibrillation with RVR   Patient Profile:   [name] is a 86 y.o. female with a hx of aortic valve stenosis, hypertension/historically difficult to control per primary care, presenting to the hospital with shortness of breath, chest tightness, atrial fibrillation with RVR   History of Present Illness:   Ms. Underwood reports that she was in her usual state of health, woke up early this morning went to the bathroom on the way back to the bed developed paroxysmal tachycardia, shortness of breath, chest tightness She called her daughter who recommended she unlock the front door, turn her alarm off She called EMS EMS EKG confirming atrial fibrillation with RVR  EKG in the emergency room Noted to be in atrial fibrillation with RVR rate 188 bpm  Received diltiazem in the emergency room, converted to normal sinus rhythm  Denies any chest pain, shortness of breath, only reports that she is cold which is a chronic issue  Lives independently, daughter at the bedside Up until today he reports having no prior history of arrhythmia  Lab work reviewed BNP 227, troponin 12 History of chronic anemia, hemoglobin 10.5  Prior records reviewed with her in detail Echo in 03/2016 - Left ventricle: There was moderate concentric hypertrophy.  Systolic function was normal. The estimated ejection fraction was  in the range of 55% to 65%.  - Aortic valve: There was mild to moderate stenosis. There was  trivial regurgitation. Valve area (VTI): 1.5 cm^2. Valve area  (Vmax): 1.34 cm^2. Valve area (Vmean): 1.16 cm^2.  - Mitral valve: There was mild regurgitation.    Past Medical  History:  Diagnosis Date  . Asthma    in past  . Heart murmur    Pt states she has Heart Murmur  . Hypertension   . Lower leg injury 04/24/2016   08/06/17- pt reports legs still not completely healed  . Renal insufficiency    10%-Right, 90%-Left (per pt)  . Skin cancer     Past Surgical History:  Procedure Laterality Date  . ABDOMINAL HYSTERECTOMY    . CATARACT EXTRACTION W/PHACO Right 07/01/2019   Procedure: CATARACT EXTRACTION PHACO AND INTRAOCULAR LENS PLACEMENT (IOC) RIGHT 9.25 01:14.7 ;  Surgeon: Birder Robson, MD;  Location: [place];  Service: Ophthalmology;  Laterality: Right;  . CATARACT EXTRACTION W/PHACO Left 09/09/2019   Procedure: CATARACT EXTRACTION PHACO AND INTRAOCULAR LENS PLACEMENT (IOC) LEFT 8.29  01:03.3;  Surgeon: Birder Robson, MD;  Location: [place];  Service: Ophthalmology;  Laterality: Left;  . COLONOSCOPY    . RENAL ARTERY STENT    . TONSILLECTOMY       Home Medications:  Prior to Admission medications   Medication Sig Start Date End Date Taking? Authorizing Provider  amLODipine (NORVASC) 5 MG tablet TAKE 1 TABLET BY MOUTH AT BEDTIME FOR HYPERTENSION Patient taking differently: Take 5 mg by mouth at bedtime. TAKE 1 TABLET BY MOUTH AT BEDTIME FOR HYPERTENSION 07/07/19  Yes Boscia, [name] E, NP  clonazePAM (KLONOPIN) 1 MG tablet Take 1 tablet (1 mg total) by mouth at bedtime as needed for anxiety. 04/29/19  Yes Boscia, Greer Ee, NP  cloNIDine (CATAPRES) 0.1 MG tablet Take 1 tablet (0.1 mg total) by mouth 2 (two) times daily. 09/01/19  Yes  Ronnell Freshwater, NP  estradiol (ESTRACE) 0.5 MG tablet TAKE 1 TABLET(S) BY MOUTH DAILY Patient taking differently: Take 0.5 mg by mouth daily. TAKE 1 TABLET(S) BY MOUTH DAILY 03/17/19  Yes Boscia, [name] E, NP  irbesartan (AVAPRO) 300 MG tablet Take 1 tablet (300 mg total) by mouth daily. 08/15/19  Yes Boscia, Greer Ee, NP  labetalol (NORMODYNE) 100 MG tablet Take 1.5 tablets (150 mg total) by  mouth 2 (two) times daily. 08/25/19  Yes Boscia, [name] E, NP  montelukast (SINGULAIR) 10 MG tablet Take 1 tablet (10 mg total) by mouth daily. 04/29/19  Yes Boscia, Greer Ee, NP  vitamin B-12 (CYANOCOBALAMIN) 1000 MCG tablet Take 1,000 mcg by mouth daily.   Yes [provider]  Apoaequorin (PREVAGEN PO) Take by mouth daily.    [provider]  Ascorbic Acid (VITAMIN C) 1000 MG tablet Take 1,000 mg by mouth daily.    [provider]  aspirin EC 81 MG tablet Take 81 mg by mouth daily.    [provider]  Biotin 5000 MCG TABS Take by mouth daily.    [provider]  calcium-vitamin D (OSCAL WITH D) 500-200 MG-UNIT tablet Take 1 tablet by mouth daily with breakfast.    [provider]  cholecalciferol (VITAMIN D) 1000 units tablet Take 1,000 Units by mouth daily.    [provider]  Polyethyl Glycol-Propyl Glycol (SYSTANE OP) Apply to eye daily.    [provider]  PROAIR HFA 108 [phone] Base) MCG/ACT inhaler Inhale 2 puffs into the lungs every 4 (four) hours as needed. 01/27/16   [provider]    Inpatient Medications: Scheduled Meds:  Continuous Infusions: . diltiazem (CARDIZEM) infusion 5 mg/hr (12/11/19 1204)   PRN Meds:   Allergies:    Allergies  Allergen Reactions  . Bacitracin     Unknown   . Neomycin     unknown  . Penicillamine Other (See Comments)  . Penicillins Diarrhea    Social History:   Social History   Socioeconomic History  . Marital status: Widowed    Spouse name: Not on file  . Number of children: Not on file  . Years of education: Not on file  . Highest education level: Not on file  Occupational History  . Not on file  Tobacco Use  . Smoking status: Never Smoker  . Smokeless tobacco: Never Used  Vaping Use  . Vaping Use: Never used  Substance and Sexual Activity  . Alcohol use: No  . Drug use: No  . Sexual activity: Not on file  Other Topics Concern  . Not on file    Social History Narrative  . Not on file   Social Determinants of Health   Financial Resource Strain:   . Difficulty of Paying Living Expenses:   Food Insecurity:   . Worried About Charity fundraiser in the Last Year:   . Arboriculturist in the Last Year:   Transportation Needs:   . Film/video editor (Medical):   Marland Kitchen Lack of Transportation (Non-Medical):   Physical Activity:   . Days of Exercise per Week:   . Minutes of Exercise per Session:   Stress:   . Feeling of Stress :   Social Connections:   . Frequency of Communication with Friends and Family:   . Frequency of Social Gatherings with Friends and Family:   . Attends Religious Services:   . Active Member of Clubs or Organizations:   . Attends  Club or Organization Meetings:   Marland Kitchen Marital Status:   Intimate Partner Violence:   . Fear of Current or Ex-Partner:   . Emotionally Abused:   Marland Kitchen Physically Abused:   . Sexually Abused:     Family History:    Family History  Problem Relation Age of Onset  . Hypertension Mother      ROS:  Please see the history of present illness.  Review of Systems  Constitutional: Negative.   HENT: Negative.   Respiratory: Positive for shortness of breath.   Cardiovascular: Positive for chest pain.  Gastrointestinal: Negative.   Musculoskeletal: Negative.   Neurological: Negative.   Psychiatric/Behavioral: Negative.   All other systems reviewed and are negative.   Physical Exam/Data:   Vitals:   12/11/19 1216 12/11/19 1217 12/11/19 1218 12/11/19 1230  BP:    131/76  Pulse: 71 72 68 85  Resp: 20  15 14   Temp:      SpO2: 100% 100% 99% 96%  Weight:      Height:       No intake or output data in the 24 hours ending 12/11/19 1301 Last 3 Weights 12/11/2019 09/09/2019 08/27/2019  Weight (lbs) 110 lb 106 lb 14.8 oz 107 lb  Weight (kg) 49.896 kg 48.5 kg 48.535 kg     Body mass index is 21.48 kg/m.  General:  Well nourished, well developed, in no acute distress HEENT:  normal Lymph: no adenopathy Neck: no JVD Endocrine:  No thryomegaly Vascular: No carotid bruits; FA pulses 2+ bilaterally without bruits  Cardiac:  normal S1, S2; RRR; no murmur  Lungs:  clear to auscultation bilaterally, no wheezing, rhonchi or rales  Abd: soft, nontender, no hepatomegaly  Ext: no edema Musculoskeletal:  No deformities, BUE and BLE strength normal and equal Skin: warm and dry  Neuro:  CNs 2-12 intact, no focal abnormalities noted Psych:  Normal affect   EKG:  The EKG was personally reviewed and demonstrates:   EKG with atrial fibrillation rate 134 bpm nonspecific ST abnormality   Telemetry:   telemetry was personally reviewed and demonstrates: Normal sinus rhythm in the 60s  Relevant CV Studies:   Laboratory Data:  High Sensitivity Troponin:   Recent Labs  Lab 12/11/19 1151  TROPONINIHS 12     Chemistry Recent Labs  Lab 12/11/19 1151  NA 142  K 3.8  CL 109  CO2 26  GLUCOSE 109*  BUN 19  CREATININE 1.01*  CALCIUM 9.6  GFRNONAA 50*  GFRAA 58*  ANIONGAP 7    No results for input(s): PROT, ALBUMIN, AST, ALT, ALKPHOS, BILITOT in the last 168 hours. Hematology Recent Labs  Lab 12/11/19 1151  WBC 7.6  RBC 3.33*  HGB 10.5*  HCT 30.9*  MCV 92.8  MCH 31.5  MCHC 34.0  RDW 12.8  PLT 247   BNPNo results for input(s): BNP, PROBNP in the last 168 hours.  DDimer No results for input(s): DDIMER in the last 168 hours.   Radiology/Studies:  DG Chest Portable 1 View  Result Date: 12/11/2019 CLINICAL DATA:  Chest pain EXAM: PORTABLE CHEST 1 VIEW COMPARISON:  2017 FINDINGS: Low lung volumes. Mild interstitial prominence. No pleural effusion or pneumothorax. Heart size is within normal limits for technique. Dextrocurvature of the thoracolumbar junction. IMPRESSION: Mild interstitial prominence, which could reflect edema. Electronically Signed   By: Macy Mis M.D.   On: 12/11/2019 12:20     Assessment and Plan:   1. Paroxysmal atrial  fibrillation No prior history that  she is aware of Acute onset this morning, rapid rate 180 bpm With associated symptom of shortness of breath chest pressure Converting in the emergency room on diltiazem Medications have been held, holding normal sinus rhythm in the 60s, feels at her baseline CHADS VASC: at least 4, hypertension, age, female Would start Eliquis  2.5 twice daily reduced dose based on low weight, age,  -Increase labetalol up to 225 mg twice daily -outpt echo, will help arrange Outpatient follow-up in clinic Risk and benefit of anticoagulation discussed with her and her daughter at the bedside Patient does have remote history of falls, last mechanical fall December 2020 We will continue to monitor  2.  Aortic valve stenosis Mild to moderate in 2017, murmur appreciated on exam Likely contributor to current presentation  3.  Essential hypertension Discussed her medications in detail, polypharmacy, stressed importance of medication compliance Labetalol increased as above Recommend she monitor blood pressure and call our office with some numbers  Long discussion with patient and patient's daughter at the bedside, all questions answered Long discussion concerning pathology of atrial fibrillation, management As needed medications for recurrent episodes  Total encounter time more than 110 minutes  Greater than 50% was spent in counseling and coordination of care with the patient    For questions or updates, please contact [place] Please consult [email or web address] for contact info under    Signed, Ida Rogue, MD  12/11/2019 1:01 PM

## 2019-12-11 NOTE — ED Triage Notes (Signed)
Pt here via [facility] from home.  Pt c/o generalized chest pain for approx 1hr before calling EMS. Chest pain came on suddenly. Hx of heart murmur. Pt takes medication for heart murmur and took these approx 40mins prior to EMS arrival.   EMS EKG noted afib RVR. Rate as high as 188 and as low as 104. BP 155/100. Pt states "it usually runs high".

## 2019-12-11 NOTE — ED Provider Notes (Signed)
[facility] Emergency Department Provider Note    First MD Initiated Contact with Patient 12/11/19 1130     (approximate)  I have reviewed the triage vital signs and the nursing notes.   HISTORY  Chief Complaint    HPI [name] is a 86 y.o. female  With the below listed past medical history presents to the ER for evaluation of generalized chest pain and pressure that started this morning.  She not feeling any palpitations but arrives via EMS with A. fib with RVR.  She is on anticoagulation.  Denies any history of coronary disease but does reportedly have a heart murmur.  She denies any numbness or tingling.  No fevers.  No nausea or vomiting.  Past Medical History:  Diagnosis Date  . Asthma    in past  . Heart murmur    Pt states she has Heart Murmur  . Hypertension   . Lower leg injury 04/24/2016   08/06/17- pt reports legs still not completely healed  . Renal insufficiency    10%-Right, 90%-Left (per pt)  . Skin cancer    Family History  Problem Relation Age of Onset  . Hypertension Mother    Past Surgical History:  Procedure Laterality Date  . ABDOMINAL HYSTERECTOMY    . CATARACT EXTRACTION W/PHACO Right 07/01/2019   Procedure: CATARACT EXTRACTION PHACO AND INTRAOCULAR LENS PLACEMENT (IOC) RIGHT 9.25 01:14.7 ;  Surgeon: Birder Robson, MD;  Location: [place];  Service: Ophthalmology;  Laterality: Right;  . CATARACT EXTRACTION W/PHACO Left 09/09/2019   Procedure: CATARACT EXTRACTION PHACO AND INTRAOCULAR LENS PLACEMENT (IOC) LEFT 8.29  01:03.3;  Surgeon: Birder Robson, MD;  Location: [place];  Service: Ophthalmology;  Laterality: Left;  . COLONOSCOPY    . RENAL ARTERY STENT    . TONSILLECTOMY     Patient Active Problem List   Diagnosis Date Noted  . Abnormal weight loss 07/30/2019  . Encounter for general adult medical examination with abnormal findings 12/19/2018  . Cellulitis and abscess of leg  12/19/2018  . 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 Bladder prolapse, female, acquired 02/04/2018  . Increased urinary frequency 01/16/2018  . Right upper quadrant abdominal tenderness 01/16/2018  . Incomplete bladder emptying 12/04/2017  . Recurrent cellulitis of lower leg 12/04/2017  . Hypertension, poor control 10/28/2017  . SCC (squamous cell carcinoma), leg, left 08/02/2016  . Aortic valve insufficiency 05/02/2016  . Diverticulosis 05/02/2016  . GAD (generalized anxiety disorder) 05/02/2016  . History of gastritis 05/02/2016  . IBS (irritable bowel syndrome) 05/02/2016  . Peripheral vascular disease ([place]) 05/02/2016  . Trauma   . Syncope 04/21/2016  . SCC (squamous cell carcinoma), hand, right 03/08/2016  . Heart murmur   . Local infection of skin and subcutaneous tissue 05/15/2012      Prior to Admission medications   Medication Sig Start Date End Date Taking? Authorizing Provider  amLODipine (NORVASC) 5 MG tablet TAKE 1 TABLET BY MOUTH AT BEDTIME FOR HYPERTENSION Patient taking differently: Take 5 mg by mouth at bedtime. TAKE 1 TABLET BY MOUTH AT BEDTIME FOR HYPERTENSION 07/07/19  Yes Boscia, [name] E, NP  Apoaequorin (PREVAGEN PO) Take by mouth daily.   Yes [provider]  Ascorbic Acid (VITAMIN C) 1000 MG tablet Take 1,000 mg by mouth daily.   Yes [provider]  Biotin 5000 MCG TABS Take by mouth daily.   Yes [provider]  calcium-vitamin D (Darron Doom WITH  D) 500-200 MG-UNIT tablet Take 1 tablet by mouth daily with breakfast.   Yes [provider]  cholecalciferol (VITAMIN D) 1000 units tablet Take 1,000 Units by mouth daily.   Yes [provider]  cloNIDine (CATAPRES) 0.1 MG tablet Take 1 tablet (0.1 mg total) by mouth 2 (two) times daily. 09/01/19  Yes Boscia, [name] E, NP  estradiol (ESTRACE) 0.5 MG tablet TAKE 1 TABLET(S) BY MOUTH DAILY Patient taking differently: Take 0.5 mg by mouth  daily. TAKE 1 TABLET(S) BY MOUTH DAILY 03/17/19  Yes Boscia, [name] E, NP  irbesartan (AVAPRO) 300 MG tablet Take 1 tablet (300 mg total) by mouth daily. 08/15/19  Yes Boscia, Greer Ee, NP  labetalol (NORMODYNE) 100 MG tablet Take 1.5 tablets (150 mg total) by mouth 2 (two) times daily. 08/25/19  Yes Boscia, [name] E, NP  montelukast (SINGULAIR) 10 MG tablet Take 1 tablet (10 mg total) by mouth daily. 04/29/19  Yes Boscia, Greer Ee, NP  vitamin B-12 (CYANOCOBALAMIN) 1000 MCG tablet Take 1,000 mcg by mouth daily.   Yes [provider]  apixaban (ELIQUIS) 2.5 MG TABS tablet Take 1 tablet (2.5 mg total) by mouth 2 (two) times daily. 12/11/19   Merlyn Lot, MD  aspirin EC 81 MG tablet Take 81 mg by mouth daily.    [provider]  Polyethyl Glycol-Propyl Glycol (SYSTANE OP) Apply to eye daily.    [provider]  PROAIR HFA 108 [phone] Base) MCG/ACT inhaler Inhale 2 puffs into the lungs every 4 (four) hours as needed. 01/27/16   [provider]    Allergies Bacitracin, Neomycin, Penicillamine, and Penicillins    Social History Social History   Tobacco Use  . Smoking status: Never Smoker  . Smokeless tobacco: Never Used  Vaping Use  . Vaping Use: Never used  Substance Use Topics  . Alcohol use: No  . Drug use: No    Review of Systems Patient denies headaches, rhinorrhea, blurry vision, numbness, shortness of breath, chest pain, edema, cough, abdominal pain, nausea, vomiting, diarrhea, dysuria, fevers, rashes or hallucinations unless otherwise stated above in HPI. ____________________________________________   PHYSICAL EXAM:  VITAL SIGNS: Vitals:   12/11/19 1402 12/11/19 1430  BP:  (!) 171/60  Pulse: 64 (!) 54  Resp: 14 14  Temp:    SpO2: 100% 100%    Constitutional: Alert and oriented.  Eyes: Conjunctivae are normal.  Head: Atraumatic. Nose: No congestion/rhinnorhea. Mouth/Throat: Mucous membranes are moist.   Neck: No stridor.  Painless ROM.  Cardiovascular: Normal rate, regular rhythm. Grossly normal heart sounds.  Good peripheral circulation. Respiratory: Normal respiratory effort.  No retractions. Lungs CTAB. Gastrointestinal: Soft and nontender. No distention. No abdominal bruits. No CVA tenderness. Genitourinary:  Musculoskeletal: No lower extremity tenderness nor edema.  No joint effusions. Neurologic:  Normal speech and language. No gross focal neurologic deficits are appreciated. No facial droop Skin:  Skin is warm, dry and intact. No rash noted. Psychiatric: Mood and affect are normal. Speech and behavior are normal.  ____________________________________________   LABS (all labs ordered are listed, but only abnormal results are displayed)  Results for orders placed or performed during the hospital encounter of 12/11/19 (from the past 24 hour(s))  Basic metabolic panel     Status: Abnormal   Collection Time: 12/11/19 11:51 AM  Result Value Ref Range   Sodium 142 135 - 145 mmol/L   Potassium 3.8 3.5 - 5.1 mmol/L   Chloride 109 98 - 111 mmol/L   CO2 26 22 -  32 mmol/L   Glucose, Bld 109 (H) 70 - 99 mg/dL   BUN 19 8 - 23 mg/dL   Creatinine, Ser 1.01 (H) 0.44 - 1.00 mg/dL   Calcium 9.6 8.9 - 10.3 mg/dL   GFR calc non Af Amer 50 (L) >60 mL/min   GFR calc Af Amer 58 (L) >60 mL/min   Anion gap 7 5 - 15  CBC     Status: Abnormal   Collection Time: 12/11/19 11:51 AM  Result Value Ref Range   WBC 7.6 4.0 - 10.5 K/uL   RBC 3.33 (L) 3.87 - 5.11 MIL/uL   Hemoglobin 10.5 (L) 12.0 - 15.0 g/dL   HCT 30.9 (L) 36 - 46 %   MCV 92.8 80.0 - 100.0 fL   MCH 31.5 26.0 - 34.0 pg   MCHC 34.0 30.0 - 36.0 g/dL   RDW 12.8 11.5 - 15.5 %   Platelets 247 150 - 400 K/uL   nRBC 0.0 0.0 - 0.2 %  Troponin I (High Sensitivity)     Status: None   Collection Time: 12/11/19 11:51 AM  Result Value Ref Range   Troponin I (High Sensitivity) 12 <18 ng/L  Magnesium     Status: None   Collection Time: 12/11/19 11:51 AM  Result  Value Ref Range   Magnesium 1.8 1.7 - 2.4 mg/dL  Brain natriuretic peptide     Status: Abnormal   Collection Time: 12/11/19 11:51 AM  Result Value Ref Range   B Natriuretic Peptide 227.9 (H) 0.0 - 100.0 pg/mL   ____________________________________________  EKG My review and personal interpretation at Time: 11:42   Indication: chest pain  Rate: 140  Rhythm: afib with rvr Axis: normal Other: normal intervals, no stemi, nonspecific st abn, likely rate dependent ____________________________________________  RADIOLOGY  I personally reviewed all radiographic images ordered to evaluate for the above acute complaints and reviewed radiology reports and findings.  These findings were personally discussed with the patient.  Please see medical record for radiology report.  ____________________________________________   PROCEDURES  Procedure(s) performed:  Procedures    Critical Care performed: no ____________________________________________   INITIAL IMPRESSION / ASSESSMENT AND PLAN / ED COURSE  Pertinent labs & imaging results that were available during my care of the patient were reviewed by me and considered in my medical decision making (see chart for details).   DDX: ACS, pericarditis, dysrhythmia, pna, bronchitis, costochondritis   Ameka Krigbaum is a 86 y.o. who presents to the ED with evidence of A. fib with RVR.  Patient without any history of A. fib with RVR.  Moderate risk by chads vas score of 4 not currently on any anticoagulation.  Given her heart rate and discomfort was started on Cardizem and after bolus was converted back into sinus.  Will observe in the ER.  Will consult cardiology.  Clinical Course as of Dec 11 1442  Thu Dec 11, 2019  1300 Case discussed in consultation with Dr. Rockey Situ.  She is now well appearance but that she was just having symptomatic A. fib.  Dr. Gaylyn Cheers is currently agreed evaluate patient at bedside.   [PR]    Clinical Course User  Index [PR] Merlyn Lot, MD   Patient evaluated bedside by cardiology who recommends plan for labetalol 150 twice daily and to start Eliquis for CVA prophylaxis.  Will arrange close outpatient follow-up.  Do not see any indication for hospitalization at this time.  The patient was evaluated in Emergency Department today for the symptoms described in  the history of present illness. He/she was evaluated in the context of the global COVID-19 pandemic, which necessitated consideration that the patient might be at risk for infection with the SARS-CoV-2 virus that causes COVID-19. Institutional protocols and algorithms that pertain to the evaluation of patients at risk for COVID-19 are in a state of rapid change based on information released by regulatory bodies including the CDC and federal and state organizations. These policies and algorithms were followed during the patient's care in the ED.  As part of my medical decision making, I reviewed the following data within the [place] notes reviewed and incorporated, Labs reviewed, notes from prior ED visits and [place] Controlled Substance Database   ____________________________________________   FINAL CLINICAL IMPRESSION(S) / ED DIAGNOSES  Final diagnoses:  Paroxysmal A-fib ([place])      NEW MEDICATIONS STARTED DURING THIS VISIT:  New Prescriptions   APIXABAN (ELIQUIS) 2.5 MG TABS TABLET    Take 1 tablet (2.5 mg total) by mouth 2 (two) times daily.     Note:  This document was prepared using Dragon voice recognition software and may include unintentional dictation errors.    Merlyn Lot, MD 12/11/19 1444

## 2019-12-11 NOTE — Discharge Instructions (Addendum)
Please be sure to take your labetalol 150 in the morning and at night as prescribed.  Please discontinue aspirin.  Please take Eliquis the anticoagulation recommended by cardiology.  Should return to the ER should you develop any worsening or recurrent chest discomfort or pain.  Also please return if you have any dark or bloody stools or if you fall and hit your head as this blood thinner does put you at increased risk of bleeding.

## 2019-12-16 ENCOUNTER — Other Ambulatory Visit: Payer: Self-pay | Admitting: Cardiovascular Disease

## 2019-12-16 ENCOUNTER — Telehealth: Payer: Self-pay | Admitting: *Deleted

## 2019-12-16 DIAGNOSIS — I4891 Unspecified atrial fibrillation: Secondary | ICD-10-CM

## 2019-12-16 NOTE — Telephone Encounter (Signed)
-----   Message from Clarisse Gouge sent at 12/16/2019  8:04 AM EDT ----- Regarding: echo order See below please order  ----- Message ----- From: Minna Merritts, MD Sent: 12/11/2019   2:26 PM EDT To: Rebeca Alert Burl Scheduling  Can we arrange outpatient echocardiogram for atrial fibrillation Seen in the emergency room December 11, 2019 Consult by myself Needs follow-up after echo in the office gollan or midlevel Thx TG

## 2019-12-17 ENCOUNTER — Ambulatory Visit (INDEPENDENT_AMBULATORY_CARE_PROVIDER_SITE_OTHER): Payer: Medicare Other

## 2019-12-17 ENCOUNTER — Other Ambulatory Visit: Payer: Self-pay

## 2019-12-17 DIAGNOSIS — I4891 Unspecified atrial fibrillation: Secondary | ICD-10-CM | POA: Diagnosis not present

## 2019-12-17 LAB — ECHOCARDIOGRAM COMPLETE
AR max vel: 1.13 cm2
AV Area VTI: 1.06 cm2
AV Area mean vel: 1.1 cm2
AV Mean grad: 13.3 mmHg
AV Peak grad: 23.5 mmHg
Ao pk vel: 2.43 m/s
Area-P 1/2: 5.84 cm2
Calc EF: 49.2 %
S' Lateral: 2.4 cm
Single Plane A2C EF: 49.6 %
Single Plane A4C EF: 50.8 %
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
## 2019-12-24 ENCOUNTER — Telehealth: Payer: Self-pay

## 2019-12-24 NOTE — Telephone Encounter (Signed)
Confirmed and screened for 12-26-19 ov. °

## 2019-12-25 ENCOUNTER — Encounter: Payer: Self-pay | Admitting: Physician Assistant

## 2019-12-25 ENCOUNTER — Ambulatory Visit: Payer: Medicare Other | Admitting: Nurse Practitioner

## 2019-12-25 ENCOUNTER — Other Ambulatory Visit: Payer: Self-pay

## 2019-12-25 ENCOUNTER — Ambulatory Visit: Payer: Medicare Other | Admitting: Physician Assistant

## 2019-12-25 VITALS — BP 146/88 | HR 70 | Ht 60.0 in | Wt 100.5 lb

## 2019-12-25 DIAGNOSIS — I4891 Unspecified atrial fibrillation: Secondary | ICD-10-CM | POA: Diagnosis not present

## 2019-12-25 DIAGNOSIS — I350 Nonrheumatic aortic (valve) stenosis: Secondary | ICD-10-CM | POA: Diagnosis not present

## 2019-12-25 DIAGNOSIS — I480 Paroxysmal atrial fibrillation: Secondary | ICD-10-CM | POA: Diagnosis not present

## 2019-12-25 DIAGNOSIS — I10 Essential (primary) hypertension: Secondary | ICD-10-CM

## 2019-12-25 MED ORDER — CLONIDINE HCL 0.1 MG PO TABS: 0.1000 mg | ORAL_TABLET | Freq: Every day | ORAL | 5 refills | Status: DC

## 2019-12-25 NOTE — Patient Instructions (Signed)
Medication Instructions:  1- DECREASE Clonidine Take 1 tablet (0.1 mg total) by mouth at bedtime 2- Aspirin has been removed from your med list *If you need a refill on your cardiac medications before your next appointment, please call your pharmacy*   Lab Work: Your physician recommends that you have lab work today(CBC, BMET, Tsh)  If you have labs (blood work) drawn today and your tests are completely normal, you will receive your results only by: Marland Kitchen MyChart Message (if you have MyChart) OR . A paper copy in the mail If you have any lab test that is abnormal or we need to change your treatment, we will call you to review the results.   Testing/Procedures: None ordered   Follow-Up: At [facility], you and your health needs are our priority.  As part of our continuing mission to provide you with exceptional heart care, we have created designated Provider Care Teams.  These Care Teams include your primary Cardiologist (physician) and Advanced Practice Providers (APPs -  Physician Assistants and Nurse Practitioners) who all work together to provide you with the care you need, when you need it.  We recommend signing up for the patient portal called "MyChart".  Sign up information is provided on this After Visit Summary.  MyChart is used to connect with patients for Virtual Visits (Telemedicine).  Patients are able to view lab/test results, encounter notes, upcoming appointments, etc.  Non-urgent messages can be sent to your provider as well.   To learn more about what you can do with MyChart, go to [email or web address].    Your next appointment:   2 week(s)  The format for your next appointment:   In Person  Provider:    You may see Ida Rogue, MD or Christell Faith, PA-C

## 2019-12-26 ENCOUNTER — Other Ambulatory Visit: Payer: Self-pay | Admitting: Nurse Practitioner

## 2019-12-26 ENCOUNTER — Telehealth: Payer: Self-pay

## 2019-12-26 ENCOUNTER — Ambulatory Visit: Payer: Medicare Other | Admitting: Nurse Practitioner

## 2019-12-26 DIAGNOSIS — F411 Generalized anxiety disorder: Secondary | ICD-10-CM

## 2019-12-26 LAB — CBC
Hematocrit: 31.6 % — ABNORMAL LOW (ref 34.0–46.6)
Hemoglobin: 10.5 g/dL — ABNORMAL LOW (ref 11.1–15.9)
MCH: 31.3 pg (ref 26.6–33.0)
MCHC: 33.2 g/dL (ref 31.5–35.7)
MCV: 94 fL (ref 79–97)
Platelets: 250 10*3/uL (ref 150–450)
RBC: 3.36 x10E6/uL — ABNORMAL LOW (ref 3.77–5.28)
RDW: 12.3 % (ref 11.7–15.4)
WBC: 8.1 10*3/uL (ref 3.4–10.8)

## 2019-12-26 LAB — BASIC METABOLIC PANEL
BUN/Creatinine Ratio: 20 (ref 12–28)
BUN: 21 mg/dL (ref 8–27)
CO2: 22 mmol/L (ref 20–29)
Calcium: 10.3 mg/dL (ref 8.7–10.3)
Chloride: 106 mmol/L (ref 96–106)
Creatinine, Ser: 1.04 mg/dL — ABNORMAL HIGH (ref 0.57–1.00)
GFR calc Af Amer: 55 mL/min/{1.73_m2} — ABNORMAL LOW (ref >59)
GFR calc non Af Amer: 48 mL/min/{1.73_m2} — ABNORMAL LOW (ref >59)
Glucose: 97 mg/dL (ref 65–99)
Potassium: 4.3 mmol/L (ref 3.5–5.2)
Sodium: 144 mmol/L (ref 134–144)

## 2019-12-26 LAB — TSH: TSH: 2.86 u[IU]/mL (ref 0.450–4.500)

## 2019-12-26 MED ORDER — CLONAZEPAM 1 MG PO TABS: 1.0000 mg | ORAL_TABLET | Freq: Every evening | ORAL | 2 refills | Status: DC | PRN

## 2019-12-26 NOTE — Telephone Encounter (Signed)
Call to patient to review labs.    Left detailed message of result.    Advised pt to call for any further questions or concerns.  No further orders.

## 2019-12-26 NOTE — Progress Notes (Signed)
Refilled clonazepam and sent to pharmacy.

## 2019-12-26 NOTE — Telephone Encounter (Signed)
-----   Message from Rise Mu, PA-C sent at 12/26/2019  7:15 AM EDT ----- Blood count is low, though stable Potassium is at goal Renal function is stable Thyroid function is normal  -No changes

## 2019-12-26 NOTE — Telephone Encounter (Signed)
Denied pt need appt for clonazepam refills

## 2019-12-29 ENCOUNTER — Encounter: Payer: Self-pay | Admitting: Emergency Medicine

## 2019-12-29 ENCOUNTER — Telehealth: Payer: Self-pay | Admitting: Cardiovascular Disease

## 2019-12-29 ENCOUNTER — Other Ambulatory Visit: Payer: Self-pay

## 2019-12-29 ENCOUNTER — Emergency Department
Admission: EM | Admit: 2019-12-29 | Discharge: 2019-12-29 | Disposition: A | Discharge status: Home / Self Care | Payer: Medicare Other | Attending: Emergency Medicine | Admitting: Emergency Medicine

## 2019-12-29 DIAGNOSIS — K644 Residual hemorrhoidal skin tags: Secondary | ICD-10-CM | POA: Diagnosis not present

## 2019-12-29 DIAGNOSIS — Z7901 Long term (current) use of anticoagulants: Secondary | ICD-10-CM

## 2019-12-29 DIAGNOSIS — I10 Essential (primary) hypertension: Secondary | ICD-10-CM | POA: Insufficient documentation

## 2019-12-29 DIAGNOSIS — J45909 Unspecified asthma, uncomplicated: Secondary | ICD-10-CM | POA: Insufficient documentation

## 2019-12-29 DIAGNOSIS — K649 Unspecified hemorrhoids: Secondary | ICD-10-CM

## 2019-12-29 DIAGNOSIS — K625 Hemorrhage of anus and rectum: Secondary | ICD-10-CM | POA: Diagnosis present

## 2019-12-29 DIAGNOSIS — Z79899 Other long term (current) drug therapy: Secondary | ICD-10-CM | POA: Diagnosis not present

## 2019-12-29 LAB — COMPREHENSIVE METABOLIC PANEL
ALT: 15 U/L (ref 0–44)
AST: 19 U/L (ref 15–41)
Albumin: 4.1 g/dL (ref 3.5–5.0)
Alkaline Phosphatase: 117 U/L (ref 38–126)
Anion gap: 8 (ref 5–15)
BUN: 25 mg/dL — ABNORMAL HIGH (ref 8–23)
CO2: 27 mmol/L (ref 22–32)
Calcium: 9.6 mg/dL (ref 8.9–10.3)
Chloride: 106 mmol/L (ref 98–111)
Creatinine, Ser: 1.02 mg/dL — ABNORMAL HIGH (ref 0.44–1.00)
GFR calc Af Amer: 57 mL/min — ABNORMAL LOW (ref >60)
GFR calc non Af Amer: 49 mL/min — ABNORMAL LOW (ref >60)
Glucose, Bld: 111 mg/dL — ABNORMAL HIGH (ref 70–99)
Potassium: 3.8 mmol/L (ref 3.5–5.1)
Sodium: 141 mmol/L (ref 135–145)
Total Bilirubin: 0.8 mg/dL (ref 0.3–1.2)
Total Protein: 6.8 g/dL (ref 6.5–8.1)

## 2019-12-29 LAB — CBC
HCT: 32.5 % — ABNORMAL LOW (ref 36.0–46.0)
Hemoglobin: 11 g/dL — ABNORMAL LOW (ref 12.0–15.0)
MCH: 31.5 pg (ref 26.0–34.0)
MCHC: 33.8 g/dL (ref 30.0–36.0)
MCV: 93.1 fL (ref 80.0–100.0)
Platelets: 244 10*3/uL (ref 150–400)
RBC: 3.49 MIL/uL — ABNORMAL LOW (ref 3.87–5.11)
RDW: 12.6 % (ref 11.5–15.5)
WBC: 11.7 10*3/uL — ABNORMAL HIGH (ref 4.0–10.5)
nRBC: 0 % (ref 0.0–0.2)

## 2019-12-29 LAB — URINALYSIS, COMPLETE (UACMP) WITH MICROSCOPIC
Bilirubin Urine: NEGATIVE
Glucose, UA: NEGATIVE mg/dL
Ketones, ur: NEGATIVE mg/dL
Nitrite: NEGATIVE
Protein, ur: NEGATIVE mg/dL
Specific Gravity, Urine: 1.013 (ref 1.005–1.030)
pH: 7 (ref 5.0–8.0)

## 2019-12-29 NOTE — ED Triage Notes (Signed)
Pt in via [facility] from home, reports new onset rectal bleeding w/ rectal pain x 1 day.  Pt is on eliquis.  Denies hx of same, denies hx of hemorrhoids.  Vitals WDL, NAD noted at this time.

## 2019-12-29 NOTE — ED Provider Notes (Signed)
[facility] Emergency Department Provider Note ____________________________________________   First MD Initiated Contact with Patient 12/29/19 1253     (approximate)  I have reviewed the triage vital signs and the nursing notes.  HISTORY  Chief Complaint Rectal Bleeding   HPI [name] is a 86 y.o. female who is the ED for evaluation of rectal bleeding.  Chart review indicates recently diagnosed with A. fib and initiated on Eliquis anticoagulation within the past month.  Patient presents today to the ED for evaluation of rectal pain and blood in her stool this morning.  Patient reports "dealing with hemorrhoids for a long time" when discussing her rectal pain.  She reports pain 6/10 intensity, aching and constant when she is sitting on her bottom.  This is relieved with prone positioning or laying on her side.  She reports the pain in her bottom is similar to previous, but she noted bright red blood on the outside of her stool this morning, she called her cardiologist, who directed her to the ED for evaluation.  Patient reports not having a colonoscopy in "many years."  She does not follow with GI.  No history of GI bleeding.  Patient denies fevers, abdominal pain, syncope, vomiting or inability to pass stool    Past Medical History:  Diagnosis Date  . Asthma    in past  . Heart murmur    Pt states she has Heart Murmur  . Hypertension   . Lower leg injury 04/24/2016   08/06/17- pt reports legs still not completely healed  . Renal insufficiency    10%-Right, 90%-Left (per pt)  . Skin cancer     Patient Active Problem List   Diagnosis Date Noted  . Abnormal weight loss 07/30/2019  . Encounter for general adult medical examination with abnormal findings 12/19/2018  . Cellulitis and abscess of leg 12/19/2018  . 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 Bladder prolapse, female, acquired 02/04/2018  .  Increased urinary frequency 01/16/2018  . Right upper quadrant abdominal tenderness 01/16/2018  . Incomplete bladder emptying 12/04/2017  . Recurrent cellulitis of lower leg 12/04/2017  . Hypertension, poor control 10/28/2017  . SCC (squamous cell carcinoma), leg, left 08/02/2016  . Aortic valve insufficiency 05/02/2016  . Diverticulosis 05/02/2016  . GAD (generalized anxiety disorder) 05/02/2016  . History of gastritis 05/02/2016  . IBS (irritable bowel syndrome) 05/02/2016  . Peripheral vascular disease ([place]) 05/02/2016  . Trauma   . Syncope 04/21/2016  . SCC (squamous cell carcinoma), hand, right 03/08/2016  . Heart murmur   . Local infection of skin and subcutaneous tissue 05/15/2012    Past Surgical History:  Procedure Laterality Date  . ABDOMINAL HYSTERECTOMY    . CATARACT EXTRACTION W/PHACO Right 07/01/2019   Procedure: CATARACT EXTRACTION PHACO AND INTRAOCULAR LENS PLACEMENT (IOC) RIGHT 9.25 01:14.7 ;  Surgeon: Birder Robson, MD;  Location: [place];  Service: Ophthalmology;  Laterality: Right;  . CATARACT EXTRACTION W/PHACO Left 09/09/2019   Procedure: CATARACT EXTRACTION PHACO AND INTRAOCULAR LENS PLACEMENT (IOC) LEFT 8.29  01:03.3;  Surgeon: Birder Robson, MD;  Location: [place];  Service: Ophthalmology;  Laterality: Left;  . COLONOSCOPY    . RENAL ARTERY STENT    . TONSILLECTOMY      Prior to Admission medications   Medication Sig Start Date End Date Taking? Authorizing Provider  amLODipine (NORVASC) 5 MG tablet TAKE 1 TABLET BY MOUTH AT BEDTIME FOR HYPERTENSION Patient taking differently:  Take 5 mg by mouth at bedtime. TAKE 1 TABLET BY MOUTH AT BEDTIME FOR HYPERTENSION 07/07/19   Ronnell Freshwater, NP  amLODipine (NORVASC) 5 MG tablet Take 5 mg by mouth daily.    [provider]  apixaban (ELIQUIS) 2.5 MG TABS tablet Take 1 tablet (2.5 mg total) by mouth 2 (two) times daily. 12/11/19   Merlyn Lot, MD  Apoaequorin (PREVAGEN PO)  Take by mouth daily.    [provider]  Ascorbic Acid (VITAMIN C) 1000 MG tablet Take 1,000 mg by mouth daily.    [provider]  Biotin 5000 MCG TABS Take by mouth daily.    [provider]  calcium-vitamin D (OSCAL WITH D) 500-200 MG-UNIT tablet Take 1 tablet by mouth daily with breakfast.    [provider]  cholecalciferol (VITAMIN D) 1000 units tablet Take 1,000 Units by mouth daily.    [provider]  clonazePAM (KLONOPIN) 1 MG tablet Take 1 tablet (1 mg total) by mouth at bedtime as needed for anxiety. 12/26/19   Ronnell Freshwater, NP  cloNIDine (CATAPRES) 0.1 MG tablet Take 1 tablet (0.1 mg total) by mouth at bedtime. 12/25/19   Dunn, Areta Haber, PA-C  estradiol (ESTRACE) 0.5 MG tablet TAKE 1 TABLET(S) BY MOUTH DAILY Patient taking differently: Take 0.5 mg by mouth daily. TAKE 1 TABLET(S) BY MOUTH DAILY 03/17/19   Ronnell Freshwater, NP  irbesartan (AVAPRO) 300 MG tablet Take 1 tablet (300 mg total) by mouth daily. 08/15/19   Ronnell Freshwater, NP  labetalol (NORMODYNE) 100 MG tablet Take 1.5 tablets (150 mg total) by mouth 2 (two) times daily. 08/25/19   Ronnell Freshwater, NP  montelukast (SINGULAIR) 10 MG tablet Take 1 tablet (10 mg total) by mouth daily. 04/29/19   Ronnell Freshwater, NP  Polyethyl Glycol-Propyl Glycol (SYSTANE OP) Apply to eye daily.    [provider]  PROAIR HFA 108 [phone] Base) MCG/ACT inhaler Inhale 2 puffs into the lungs every 4 (four) hours as needed. 01/27/16   [provider]  vitamin B-12 (CYANOCOBALAMIN) 1000 MCG tablet Take 1,000 mcg by mouth daily.    [provider]    Allergies Bacitracin, Neomycin, Penicillamine, and Penicillins  Family History  Problem Relation Age of Onset  . Hypertension Mother     Social History Social History   Tobacco Use  . Smoking status: Never Smoker  . Smokeless tobacco: Never Used  Vaping Use  . Vaping Use: Never used  Substance Use Topics  . Alcohol  use: No  . Drug use: No    Review of Systems  Constitutional: No fever/chills Eyes: No visual changes. ENT: No sore throat. Cardiovascular: Denies chest pain. Respiratory: Denies shortness of breath. Gastrointestinal: No abdominal pain.  No nausea, no vomiting.  No diarrhea.  No constipation. Positive for rectal bleeding Genitourinary: Negative for dysuria. Musculoskeletal: Negative for back pain. Skin: Negative for rash. Neurological: Negative for headaches, focal weakness or numbness.   ____________________________________________   PHYSICAL EXAM:  VITAL SIGNS: Vitals:   12/29/19 1100  BP: (!) 148/60  Pulse: 84  Resp: 18  Temp: 98.8 F (37.1 C)  SpO2: 97%      Constitutional: Alert and oriented. Well appearing and in no acute distress. Eyes: Conjunctivae are normal. PERRL. EOMI. Head: Atraumatic. Nose: No congestion/rhinnorhea. Mouth/Throat: Mucous membranes are moist.  Oropharynx non-erythematous. Neck: No stridor. No cervical spine tenderness to palpation. Cardiovascular: Normal rate, regular rhythm. Grossly normal heart sounds.  Good peripheral circulation.  Respiratory: Normal respiratory effort.  No retractions. Lungs CTAB. Gastrointestinal: Soft , nondistended, nontender to palpation. No abdominal bruits. No CVA tenderness. GU: Soiled diaper with soft brown stool.  Rectal exam with hard stool in the rectal vault.  Single, nonthrombosed, external hemorrhoid less than 1 cm in diameter noted on external examination.  No active bleeding.  No melena noted.  Brown stool in the rectal vault is Hemoccult positive. Musculoskeletal: No lower extremity tenderness nor edema.  No joint effusions. No signs of acute trauma. Neurologic:  Normal speech and language. No gross focal neurologic deficits are appreciated. No gait instability noted. Skin:  Skin is warm, dry and intact. No rash noted. Psychiatric: Mood and affect are normal. Speech and behavior are  normal.  ____________________________________________   LABS (all labs ordered are listed, but only abnormal results are displayed)  Labs Reviewed  COMPREHENSIVE METABOLIC PANEL - Abnormal; Notable for the following components:      Result Value   Glucose, Bld 111 (*)    BUN 25 (*)    Creatinine, Ser 1.02 (*)    GFR calc non Af Amer 49 (*)    GFR calc Af Amer 57 (*)    All other components within normal limits  CBC - Abnormal; Notable for the following components:   WBC 11.7 (*)    RBC 3.49 (*)    Hemoglobin 11.0 (*)    HCT 32.5 (*)    All other components within normal limits  URINALYSIS, COMPLETE (UACMP) WITH MICROSCOPIC - Abnormal; Notable for the following components:   Color, Urine YELLOW (*)    APPearance CLOUDY (*)    Hgb urine dipstick SMALL (*)    Leukocytes,Ua MODERATE (*)    Bacteria, UA FEW (*)    All other components within normal limits  POC OCCULT BLOOD, ED   ____________________________________________  ____________________________________________   INITIAL IMPRESSION / ASSESSMENT AND PLAN / ED COURSE  86 year old woman recently started on Eliquis for A. fib presenting with rectal bleeding, most consistent with bleeding external hemorrhoid, without evidence of additional pathology, normal temperature management with GI follow-up.  Normal vital signs and room air.  Reassuring exam with pleasant and joking patient, benign abdomen.  Anogenital exam with evidence of external hemorrhoid as likely source of bleeding.  No active bleeding my examination.  Brown stool in the rectal vault that is Hemoccult positive.  Patient has no active bleeding or signs of pathology to warrant inpatient admission for evaluation.  I urged her to call GI and to be seen as an outpatient to have a colonoscopy performed.  We discussed risk and benefit of continued Eliquis usage to prevent stroke, and shared decision-making yields plan to continue Eliquis at this time due to small amount of  bleeding that seems to be coming from the hemorrhoid.  Educated patient to have further discussions with her primary care physician but the utility of this medication.  Outpatient management reviewed and return precautions to the ED were discussed.  Patient medically stable for discharge home. ____________________________________________   FINAL CLINICAL IMPRESSION(S) / ED DIAGNOSES  Final diagnoses:  Bleeding hemorrhoid  Anticoagulated     ED Discharge Orders    None       [name]   Note:  This document was prepared using Dragon voice recognition software and may include unintentional dictation errors.   Vladimir Crofts, MD 12/29/19 [phone]

## 2019-12-29 NOTE — Telephone Encounter (Signed)
Called the daughter back. Patient lives alone and daughter is on her way to the patient's house now. She lives about 7 miles/10 minutes away. I asked if patient was going to call 911 and she said "I hope so." I called daughter instead of patient in case patient was on the phone with 911 dispatcher. Advised daughter that she should call 911 when she gets there as well considering the symptoms noted in the initial documentation.

## 2019-12-29 NOTE — ED Notes (Signed)
Pt presents to the ED for rectal bleeding and rectal pain. Pt states she is on blood thinners and it started yesterday. Pt denies NVD but says that she feels that if she cannot empty her bladder fully. Pt is A&Ox4 and NAD.

## 2019-12-29 NOTE — Discharge Instructions (Addendum)
You were seen in the ED because of your rectal bleeding and pain.  This is most likely bleeding from the external hemorrhoid that you have on your bottom right now. Your blood counts are stable, actually improved from previous readings in our system.  You do not have signs of a bladder infection/UTI.  I would recommend buying a doughnut/butt pillow to help relieve pressure from your bottom.  Thoroughly wipe yourself after using the bathroom, as further stool irritating this area will make it worse.  I have attached the phone number for Dr. Allen Norris, a local gastroenterologist you should call to be evaluated for rectal bleeding.  They will likely need to perform a colonoscopy.  In the meantime, if your bleeding is significantly worse, if you are recurrently passing out, having chest pain or fevers, please return to the ED.

## 2019-12-29 NOTE — ED Notes (Signed)
Assisted Dr. Tamala Julian with rectal exam. Hemoccult Positive

## 2019-12-29 NOTE — Telephone Encounter (Signed)
Pt c/o medication issue:  1. Name of Medication: eliquis 2.5 mg po BID  2. How are you currently taking this medication (dosage and times per day)? 2.5 mg po BID new med per daughter  3. Are you having a reaction (difficulty breathing--STAT)? Bleeding per rectum since Sunday severe cramping patient expresses severe pain lightheadedness audibly distressed    4. What is your medication issue?  Daughter not sure if patient should go to ed call 911 or come to office   Patient sounded in distress and advised to call 911

## 2020-01-08 ENCOUNTER — Ambulatory Visit: Payer: Medicare Other | Admitting: Nurse Practitioner

## 2020-01-09 ENCOUNTER — Encounter: Payer: Self-pay | Admitting: Hospice and Palliative Medicine

## 2020-01-09 ENCOUNTER — Telehealth: Payer: Self-pay

## 2020-01-09 ENCOUNTER — Ambulatory Visit: Payer: Medicare Other | Admitting: Hospice and Palliative Medicine

## 2020-01-09 DIAGNOSIS — I10 Essential (primary) hypertension: Secondary | ICD-10-CM | POA: Diagnosis not present

## 2020-01-09 DIAGNOSIS — M545 Low back pain, unspecified: Secondary | ICD-10-CM

## 2020-01-09 MED ORDER — HYDROCODONE-ACETAMINOPHEN 5-325 MG PO TABS: 1.0000 | ORAL_TABLET | Freq: Two times a day (BID) | ORAL | 0 refills | Status: DC | PRN

## 2020-01-09 NOTE — Progress Notes (Signed)
[facility] [place], [place] [address]  Internal MEDICINE  Telephone Visit  Patient Name: [name]  [id number]  [id number]  Date of Service: 01/10/2020  I connected with the patient at 3:55 by telephone and verified the patients identity using two identifiers.   I discussed the limitations, risks, security and privacy concerns of performing an evaluation and management service by telephone and the availability of in person appointments. I also discussed with the patient that there may be a patient responsible charge related to the service.  The patient expressed understanding and agrees to proceed.    Chief Complaint  Patient presents with  . Telephone Assessment  . Telephone Screen  . Back Pain    pt stated that she drove her car monday and tues to [place] to get it inspected and on Wed she couldn't get out of bed  . Leg Pain  . Rectal Pain    right buttock    HPI Connected with the patient via telephone. Complains today of buttock, leg, and back pain. This pain started on Wednesday. Pain today is limiting her movement, she reports she has to hunch over to walk to alleviate the pain. Right buttock pain that radiates to low back and right leg.  Has had this same type of pain in the past, she contributes it to arthritis. She has not had a fall and does not recall bumping into anything or hitting her right side on anything. The pain is preventing her from sleeping. Has taken OTC acetaminophen and has noticed some mild pain relief. In the past, has been put on muscle relaxer and hydrocodone for this type of pain which helped. Imaging if pain persists. Advised her to call office on Monday to report symptoms.  Current Medication: Outpatient Encounter Medications as of 01/09/2020  Medication Sig  . amLODipine (NORVASC) 5 MG tablet TAKE 1 TABLET BY MOUTH AT BEDTIME FOR HYPERTENSION (Patient taking differently: Take 5 mg by mouth at bedtime. TAKE 1 TABLET BY  MOUTH AT BEDTIME FOR HYPERTENSION)  . amLODipine (NORVASC) 5 MG tablet Take 5 mg by mouth daily.  Marland Kitchen apixaban (ELIQUIS) 2.5 MG TABS tablet Take 1 tablet (2.5 mg total) by mouth 2 (two) times daily.  Marland Kitchen Apoaequorin (PREVAGEN PO) Take by mouth daily.  . Ascorbic Acid (VITAMIN C) 1000 MG tablet Take 1,000 mg by mouth daily.  . Biotin 5000 MCG TABS Take by mouth daily.  . calcium-vitamin D (OSCAL WITH D) 500-200 MG-UNIT tablet Take 1 tablet by mouth daily with breakfast.  . cholecalciferol (VITAMIN D) 1000 units tablet Take 1,000 Units by mouth daily.  . clonazePAM (KLONOPIN) 1 MG tablet Take 1 tablet (1 mg total) by mouth at bedtime as needed for anxiety.  . cloNIDine (CATAPRES) 0.1 MG tablet Take 1 tablet (0.1 mg total) by mouth at bedtime.  Marland Kitchen estradiol (ESTRACE) 0.5 MG tablet TAKE 1 TABLET(S) BY MOUTH DAILY (Patient taking differently: Take 0.5 mg by mouth daily. TAKE 1 TABLET(S) BY MOUTH DAILY)  . irbesartan (AVAPRO) 300 MG tablet Take 1 tablet (300 mg total) by mouth daily.  Marland Kitchen labetalol (NORMODYNE) 100 MG tablet Take 1.5 tablets (150 mg total) by mouth 2 (two) times daily.  . montelukast (SINGULAIR) 10 MG tablet Take 1 tablet (10 mg total) by mouth daily.  Vladimir Faster Glycol-Propyl Glycol (SYSTANE OP) Apply to eye daily.  Marland Kitchen PROAIR HFA 108 (90 Base) MCG/ACT inhaler Inhale 2 puffs into the lungs every 4 (four) hours as needed.  Marland Kitchen  vitamin B-12 (CYANOCOBALAMIN) 1000 MCG tablet Take 1,000 mcg by mouth daily.  Marland Kitchen HYDROcodone-acetaminophen (NORCO/VICODIN) 5-325 MG tablet Take 1 tablet by mouth 2 (two) times daily as needed for severe pain.   No facility-administered encounter medications on file as of 01/09/2020.    Surgical History: Past Surgical History:  Procedure Laterality Date  . ABDOMINAL HYSTERECTOMY    . CATARACT EXTRACTION W/PHACO Right 07/01/2019   Procedure: CATARACT EXTRACTION PHACO AND INTRAOCULAR LENS PLACEMENT (IOC) RIGHT 9.25 01:14.7 ;  Surgeon: Birder Robson, MD;  Location:  [place];  Service: Ophthalmology;  Laterality: Right;  . CATARACT EXTRACTION W/PHACO Left 09/09/2019   Procedure: CATARACT EXTRACTION PHACO AND INTRAOCULAR LENS PLACEMENT (IOC) LEFT 8.29  01:03.3;  Surgeon: Birder Robson, MD;  Location: [place];  Service: Ophthalmology;  Laterality: Left;  . COLONOSCOPY    . RENAL ARTERY STENT    . TONSILLECTOMY      Medical History: Past Medical History:  Diagnosis Date  . Asthma    in past  . Heart murmur    Pt states she has Heart Murmur  . Hypertension   . Lower leg injury 04/24/2016   08/06/17- pt reports legs still not completely healed  . Renal insufficiency    10%-Right, 90%-Left (per pt)  . Skin cancer     Family History: Family History  Problem Relation Age of Onset  . Hypertension Mother     Social History   Socioeconomic History  . Marital status: Widowed    Spouse name: Not on file  . Number of children: Not on file  . Years of education: Not on file  . Highest education level: Not on file  Occupational History  . Not on file  Tobacco Use  . Smoking status: Never Smoker  . Smokeless tobacco: Never Used  Vaping Use  . Vaping Use: Never used  Substance and Sexual Activity  . Alcohol use: No  . Drug use: No  . Sexual activity: Not on file  Other Topics Concern  . Not on file  Social History Narrative  . Not on file   Social Determinants of Health   Financial Resource Strain:   . Difficulty of Paying Living Expenses:   Food Insecurity:   . Worried About Charity fundraiser in the Last Year:   . Arboriculturist in the Last Year:   Transportation Needs:   . Film/video editor (Medical):   Marland Kitchen Lack of Transportation (Non-Medical):   Physical Activity:   . Days of Exercise per Week:   . Minutes of Exercise per Session:   Stress:   . Feeling of Stress :   Social Connections:   . Frequency of Communication with Friends and Family:   . Frequency of Social Gatherings with Friends and  Family:   . Attends Religious Services:   . Active Member of Clubs or Organizations:   . Attends Archivist Meetings:   Marland Kitchen Marital Status:   Intimate Partner Violence:   . Fear of Current or Ex-Partner:   . Emotionally Abused:   Marland Kitchen Physically Abused:   . Sexually Abused:    Review of Systems  Constitutional: Positive for activity change. Negative for fatigue and fever.  Eyes: Negative for visual disturbance.  Respiratory: Negative for chest tightness, shortness of breath and wheezing.   Cardiovascular: Negative for chest pain, palpitations and leg swelling.  Gastrointestinal: Negative for abdominal pain, diarrhea, nausea and vomiting.  Genitourinary: Negative for difficulty urinating, dysuria, flank  pain, frequency and hematuria.  Musculoskeletal: Positive for arthralgias, back pain and gait problem.       Right buttock pain radiating to right lower back and right leg causing gait disturbances  Neurological: Negative for dizziness, weakness, light-headedness and headaches.  Psychiatric/Behavioral: Negative for confusion. The patient is not nervous/anxious.     Vital Signs: Ht 5' (1.524 m)   Wt 98 lb (44.5 kg)   BMI 19.14 kg/m    Observation/Objective: Reports that while on telephone call she is sitting in chair sideways to avoid sitting on right side due to pain. No acute distress noted. Over weekend advised her if pain worsens she will need to be evaluated in emergency room. Also advised to call office on Monday to update on symptoms. Educated on avoiding taking hydrocodone along with other sedating medications. Also advised her this medication has acetaminophen and to limit further acetaminophen use.   Assessment/Plan: 1. Low back pain radiating to right lower extremity Right buttock, low back and right leg pain since Wednesday. Pain is limiting her movement, gait disturbances and affecting her sleep. She has tried OTC acetaminophen. Denies falling or injury to affected  area. Has had this same pain in the past and was alleviated by hydrocodone. Will reassess symptoms on Monday. May require imaging if pain persists. - HYDROcodone-acetaminophen (NORCO/VICODIN) 5-325 MG tablet; Take 1 tablet by mouth 2 (two) times daily as needed for severe pain.  Dispense: 10 tablet; Refill: 0  2. Hypertension, poor control - Continue to monitor at home   General Counseling: sheli dorin understanding of the findings of today's phone visit and agrees with plan of treatment. I have discussed any further diagnostic evaluation that may be needed or ordered today. We also reviewed her medications today. she has been encouraged to call the office with any questions or concerns that should arise related to todays visit.   Meds ordered this encounter  Medications  . HYDROcodone-acetaminophen (NORCO/VICODIN) 5-325 MG tablet    Sig: Take 1 tablet by mouth 2 (two) times daily as needed for severe pain.    Dispense:  10 tablet    Refill:  0     Time spent: [place]. Mychal Decarlo AGNP-C Internal medicine

## 2020-01-09 NOTE — Telephone Encounter (Signed)
Confirmed and screened for 01-13-20 ov. 

## 2020-01-12 ENCOUNTER — Other Ambulatory Visit: Payer: Self-pay

## 2020-01-13 ENCOUNTER — Telehealth: Payer: Self-pay

## 2020-01-13 ENCOUNTER — Ambulatory Visit: Payer: Medicare Other | Admitting: Nurse Practitioner

## 2020-01-13 ENCOUNTER — Ambulatory Visit: Payer: Medicare Other | Admitting: Hospice and Palliative Medicine

## 2020-01-13 NOTE — Telephone Encounter (Signed)
Pt daughter called pt is having difficulty walking and oxycodone is not helping and she daughter unable to bring in to office as per Dr Humphrey Rolls advised pt and her daughter need to go to ED

## 2020-01-14 ENCOUNTER — Other Ambulatory Visit: Payer: Self-pay

## 2020-01-14 MED ORDER — AMLODIPINE BESYLATE 5 MG PO TABS: ORAL_TABLET | ORAL | 1 refills | Status: DC
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
## 2020-01-16 ENCOUNTER — Other Ambulatory Visit: Payer: Self-pay

## 2020-01-16 ENCOUNTER — Ambulatory Visit: Payer: Medicare Other | Admitting: Cardiovascular Disease

## 2020-01-16 ENCOUNTER — Encounter: Payer: Self-pay | Admitting: Cardiovascular Disease

## 2020-01-16 VITALS — BP 150/70 | HR 69 | Ht 60.0 in | Wt 100.0 lb

## 2020-01-16 DIAGNOSIS — I739 Peripheral vascular disease, unspecified: Secondary | ICD-10-CM

## 2020-01-16 DIAGNOSIS — I350 Nonrheumatic aortic (valve) stenosis: Secondary | ICD-10-CM | POA: Diagnosis not present

## 2020-01-16 DIAGNOSIS — I10 Essential (primary) hypertension: Secondary | ICD-10-CM

## 2020-01-16 DIAGNOSIS — I480 Paroxysmal atrial fibrillation: Secondary | ICD-10-CM

## 2020-01-16 NOTE — Patient Instructions (Signed)

## 2020-01-22 ENCOUNTER — Other Ambulatory Visit: Payer: Self-pay

## 2020-01-22 ENCOUNTER — Ambulatory Visit: Payer: Medicare Other | Admitting: Hospice and Palliative Medicine

## 2020-01-22 ENCOUNTER — Encounter: Payer: Self-pay | Admitting: Hospice and Palliative Medicine

## 2020-01-22 DIAGNOSIS — M479 Spondylosis, unspecified: Secondary | ICD-10-CM

## 2020-01-22 DIAGNOSIS — M5441 Lumbago with sciatica, right side: Secondary | ICD-10-CM | POA: Diagnosis not present

## 2020-01-22 DIAGNOSIS — G8929 Other chronic pain: Secondary | ICD-10-CM

## 2020-01-22 DIAGNOSIS — M5442 Lumbago with sciatica, left side: Secondary | ICD-10-CM | POA: Diagnosis not present

## 2020-01-22 DIAGNOSIS — M419 Scoliosis, unspecified: Secondary | ICD-10-CM

## 2020-01-22 MED ORDER — TRAMADOL HCL 50 MG PO TABS: 75.0000 mg | ORAL_TABLET | Freq: Four times a day (QID) | ORAL | 0 refills | Status: DC | PRN
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
## 2020-01-29 ENCOUNTER — Other Ambulatory Visit: Payer: Self-pay

## 2020-01-29 DIAGNOSIS — I10 Essential (primary) hypertension: Secondary | ICD-10-CM

## 2020-01-29 MED ORDER — IRBESARTAN 300 MG PO TABS: 300.0000 mg | ORAL_TABLET | Freq: Every day | ORAL | 0 refills | Status: DC

## 2020-01-30 ENCOUNTER — Telehealth: Payer: Self-pay

## 2020-01-30 ENCOUNTER — Other Ambulatory Visit: Payer: Self-pay | Admitting: Hospice and Palliative Medicine

## 2020-01-30 DIAGNOSIS — G8929 Other chronic pain: Secondary | ICD-10-CM

## 2020-01-30 DIAGNOSIS — M479 Spondylosis, unspecified: Secondary | ICD-10-CM

## 2020-01-30 DIAGNOSIS — M5441 Lumbago with sciatica, right side: Secondary | ICD-10-CM

## 2020-01-30 DIAGNOSIS — M419 Scoliosis, unspecified: Secondary | ICD-10-CM

## 2020-01-30 MED ORDER — TRAMADOL HCL 50 MG PO TABS: 75.0000 mg | ORAL_TABLET | Freq: Four times a day (QID) | ORAL | 0 refills | Status: DC | PRN

## 2020-01-30 NOTE — Telephone Encounter (Signed)
Spoke with  daughter that as per dr Humphrey Rolls we send 15 more tab for tramadol and also alternate with tylenlol and we can see her next Wednesday for further evaluation

## 2020-02-03 ENCOUNTER — Telehealth: Payer: Self-pay

## 2020-02-03 NOTE — Telephone Encounter (Signed)
Confirmed and screened for OV 9/8

## 2020-02-04 ENCOUNTER — Other Ambulatory Visit: Payer: Self-pay

## 2020-02-04 ENCOUNTER — Encounter: Payer: Self-pay | Admitting: Hospice and Palliative Medicine

## 2020-02-04 ENCOUNTER — Ambulatory Visit (INDEPENDENT_AMBULATORY_CARE_PROVIDER_SITE_OTHER): Payer: Medicare Other | Admitting: Hospice and Palliative Medicine

## 2020-02-04 DIAGNOSIS — M5441 Lumbago with sciatica, right side: Secondary | ICD-10-CM

## 2020-02-04 DIAGNOSIS — M5442 Lumbago with sciatica, left side: Secondary | ICD-10-CM | POA: Diagnosis not present

## 2020-02-04 DIAGNOSIS — G8929 Other chronic pain: Secondary | ICD-10-CM

## 2020-02-04 DIAGNOSIS — D0470 Carcinoma in situ of skin of unspecified lower limb, including hip: Secondary | ICD-10-CM

## 2020-02-04 DIAGNOSIS — I10 Essential (primary) hypertension: Secondary | ICD-10-CM

## 2020-02-04 MED ORDER — FENTANYL 12 MCG/HR TD PT72: 1.0000 | MEDICATED_PATCH | TRANSDERMAL | 0 refills | Status: DC

## 2020-02-04 NOTE — Progress Notes (Signed)
[facility] [place], [place] [address]  Internal MEDICINE  Office Visit Note  Patient Name: [name]  [id number]  [id number]  Date of Service: 02/06/2020  Chief Complaint  Patient presents with  . Follow-up    pain in butt that radiates down  . Hypertension  . Quality Metric Gaps    PNC    HPI Patient is here today for routine follow-up She continues to have extensive pain to her lower back that radiates down bilateral hips and legs We have tried many different pain management therapies without relief in her symptoms Her pain has limited her ability to ambulate, causes sleep disturbances from being uncomfortable in all positions  She is becoming more frustrated with the pain as it is constant and rates her pain 10 out of 10 all of the time She continues to refuse physical therapy as she feels it would be pointless until her is better managed  Current Medication: Outpatient Encounter Medications as of 02/04/2020  Medication Sig  . amLODipine (NORVASC) 5 MG tablet TAKE 1 TABLET BY MOUTH AT BEDTIME FOR HYPERTENSION  . apixaban (ELIQUIS) 2.5 MG TABS tablet Take 1 tablet (2.5 mg total) by mouth 2 (two) times daily.  Marland Kitchen Apoaequorin (PREVAGEN PO) Take by mouth daily.  . Ascorbic Acid (VITAMIN C) 1000 MG tablet Take 1,000 mg by mouth daily.  . Biotin 5000 MCG TABS Take by mouth daily.  . calcium-vitamin D (OSCAL WITH D) 500-200 MG-UNIT tablet Take 1 tablet by mouth daily with breakfast.  . cholecalciferol (VITAMIN D) 1000 units tablet Take 1,000 Units by mouth daily.  . clonazePAM (KLONOPIN) 1 MG tablet Take 1 tablet (1 mg total) by mouth at bedtime as needed for anxiety.  . cloNIDine (CATAPRES) 0.1 MG tablet Take 1 tablet (0.1 mg total) by mouth at bedtime.  . irbesartan (AVAPRO) 300 MG tablet Take 1 tablet (300 mg total) by mouth daily.  Marland Kitchen labetalol (NORMODYNE) 100 MG tablet Take 1.5 tablets (150 mg total) by mouth 2 (two) times daily.  . montelukast  (SINGULAIR) 10 MG tablet Take 1 tablet (10 mg total) by mouth daily.  Vladimir Faster Glycol-Propyl Glycol (SYSTANE OP) Apply to eye daily.  Marland Kitchen PROAIR HFA 108 (90 Base) MCG/ACT inhaler Inhale 2 puffs into the lungs every 4 (four) hours as needed.   . vitamin B-12 (CYANOCOBALAMIN) 1000 MCG tablet Take 1,000 mcg by mouth daily.  . [DISCONTINUED] estradiol (ESTRACE) 0.5 MG tablet TAKE 1 TABLET(S) BY MOUTH DAILY  . [DISCONTINUED] traMADol (ULTRAM) 50 MG tablet Take 1.5 tablets (75 mg total) by mouth every 6 (six) hours as needed for up to 10 days.  . fentaNYL (DURAGESIC) 12 MCG/HR Place 1 patch onto the skin every 3 (three) days.   No facility-administered encounter medications on file as of 02/04/2020.    Surgical History: Past Surgical History:  Procedure Laterality Date  . ABDOMINAL HYSTERECTOMY    . CATARACT EXTRACTION W/PHACO Right 07/01/2019   Procedure: CATARACT EXTRACTION PHACO AND INTRAOCULAR LENS PLACEMENT (IOC) RIGHT 9.25 01:14.7 ;  Surgeon: Birder Robson, MD;  Location: [facility];  Service: Ophthalmology;  Laterality: Right;  . CATARACT EXTRACTION W/PHACO Left 09/09/2019   Procedure: CATARACT EXTRACTION PHACO AND INTRAOCULAR LENS PLACEMENT (IOC) LEFT 8.29  01:03.3;  Surgeon: Birder Robson, MD;  Location: [facility];  Service: Ophthalmology;  Laterality: Left;  . COLONOSCOPY    . RENAL ARTERY STENT    . TONSILLECTOMY      Medical History: Past Medical  History:  Diagnosis Date  . Asthma    in past  . Heart murmur    Pt states she has Heart Murmur  . Hypertension   . Lower leg injury 04/24/2016   08/06/17- pt reports legs still not completely healed  . Renal insufficiency    10%-Right, 90%-Left (per pt)  . Skin cancer     Family History: Family History  Problem Relation Age of Onset  . Hypertension Mother     Social History   Socioeconomic History  . Marital status: Widowed    Spouse name: Not on file  . Number of children: Not on file  . Years  of education: Not on file  . Highest education level: Not on file  Occupational History  . Not on file  Tobacco Use  . Smoking status: Never Smoker  . Smokeless tobacco: Never Used  Vaping Use  . Vaping Use: Never used  Substance and Sexual Activity  . Alcohol use: No  . Drug use: No  . Sexual activity: Not on file  Other Topics Concern  . Not on file  Social History Narrative  . Not on file   Social Determinants of Health   Financial Resource Strain:   . Difficulty of Paying Living Expenses: Not on file  Food Insecurity:   . Worried About Charity fundraiser in the Last Year: Not on file  . Ran Out of Food in the Last Year: Not on file  Transportation Needs:   . Lack of Transportation (Medical): Not on file  . Lack of Transportation (Non-Medical): Not on file  Physical Activity:   . Days of Exercise per Week: Not on file  . Minutes of Exercise per Session: Not on file  Stress:   . Feeling of Stress : Not on file  Social Connections:   . Frequency of Communication with Friends and Family: Not on file  . Frequency of Social Gatherings with Friends and Family: Not on file  . Attends Religious Services: Not on file  . Active Member of Clubs or Organizations: Not on file  . Attends Archivist Meetings: Not on file  . Marital Status: Not on file  Intimate Partner Violence:   . Fear of Current or Ex-Partner: Not on file  . Emotionally Abused: Not on file  . Physically Abused: Not on file  . Sexually Abused: Not on file   Review of Systems  Constitutional: Positive for activity change and fatigue. Negative for chills and diaphoresis.  HENT: Negative for ear pain, postnasal drip and sinus pressure.   Eyes: Negative for photophobia, discharge, redness, itching and visual disturbance.  Respiratory: Negative for cough, shortness of breath and wheezing.   Cardiovascular: Negative for chest pain, palpitations and leg swelling.  Gastrointestinal: Negative for abdominal  pain, constipation, diarrhea, nausea and vomiting.  Genitourinary: Negative for dysuria and flank pain.  Musculoskeletal: Positive for arthralgias, back pain, gait problem, neck pain and neck stiffness.  Skin: Negative for color change.  Allergic/Immunologic: Negative for environmental allergies and food allergies.  Neurological: Positive for weakness. Negative for dizziness, tremors and headaches.  Hematological: Does not bruise/bleed easily.  Psychiatric/Behavioral: Negative for agitation, behavioral problems (depression) and hallucinations.    Vital Signs: BP (!) 150/55   Pulse 74   Temp (!) 97.4 F (36.3 C)   Resp 16   Ht 5' (1.524 m)   Wt 98 lb 3.2 oz (44.5 kg)   SpO2 99%   BMI 19.18 kg/m  Physical Exam Vitals reviewed.  Constitutional:      Appearance: Normal appearance. She is normal weight.  HENT:     Mouth/Throat:     Mouth: Mucous membranes are moist.     Pharynx: Oropharynx is clear.  Cardiovascular:     Rate and Rhythm: Normal rate and regular rhythm.     Pulses: Normal pulses.     Heart sounds: Normal heart sounds.  Pulmonary:     Effort: Pulmonary effort is normal.     Breath sounds: Normal breath sounds.  Abdominal:     General: Abdomen is flat.     Palpations: Abdomen is soft.  Musculoskeletal:     Cervical back: Pain with movement present. Decreased range of motion.     Thoracic back: Decreased range of motion.     Lumbar back: Decreased range of motion.     Comments: Bilateral lower back pain that radiates down to her hips and legs  Skin:    General: Skin is warm.  Neurological:     General: No focal deficit present.     Mental Status: She is alert and oriented to person, place, and time. Mental status is at baseline.  Psychiatric:        Mood and Affect: Mood normal.        Behavior: Behavior normal.        Thought Content: Thought content normal.        Judgment: Judgment normal.     Assessment/Plan: 1. Chronic midline low back pain  with bilateral sciatica At this time, stop tramadol and start Fentanyl low dose patches. Educated her and her daughter how to place and change patches. Encouraged her daughter to closely monitor her mothers response to Fentanyl patches as she is high fall risk due to impaired mobility and frailty. Will assess response to pain highly recommend PT and strength training once pain is well managed. - fentaNYL (DURAGESIC) 12 MCG/HR; Place 1 patch onto the skin every 3 (three) days.  Dispense: 10 patch; Refill: 0  2. Essential hypertension BP slightly elevated today, response to pain, HR normal. Will continue with current therapy and routine monitoring.  3. 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 Will discuss treatment plans from dermatologist at next follow-up visit.  Discussed she needs to schedule her annual physical for this year. Will need to order and review labs for CPE.  General Counseling: Lynisha verbalizes understanding of the findings of todays visit and agrees with plan of treatment. I have discussed any further diagnostic evaluation that may be needed or ordered today. We also reviewed her medications today. she has been encouraged to call the office with any questions or concerns that should arise related to todays visit.  Reviewed risks and possible side effects associated with taking opiates, benzodiazepines and other CNS depressants. Combination of these could cause dizziness and drowsiness. Advised patient not to drive or operate machinery when taking these medications, as patient's and other's life can be at risk and will have consequences. Patient verbalized understanding in this matter. Dependence and abuse for these drugs will be monitored closely. A Controlled substance policy and procedure is on file which allows [facility] to order a urine drug screen test at any visit. Patient understands and agrees with the plan    Meds ordered this encounter  Medications  . fentaNYL (  DURAGESIC) 12 MCG/HR    Sig: Place 1 patch onto the skin every 3 (three) days.    Dispense:  10 patch    Refill:  0    Time spent: 30 Minutes Time spent includes review of chart, medications, test results and follow-up plan with the patient.  This patient was seen by Theodoro Grist AGNP-C in Collaboration with Dr [name] as a part of collaborative care agreement     Tanna Furry. Zeya Balles AGNP-C Internal medicine

## 2020-02-06 ENCOUNTER — Other Ambulatory Visit: Payer: Self-pay | Admitting: Cardiovascular Disease

## 2020-02-06 MED ORDER — APIXABAN 2.5 MG PO TABS: 2.5000 mg | ORAL_TABLET | Freq: Two times a day (BID) | ORAL | 1 refills | Status: DC

## 2020-02-06 NOTE — Telephone Encounter (Signed)
Patient would like 90 days now

## 2020-02-06 NOTE — Telephone Encounter (Signed)
Refill Request.  

## 2020-02-06 NOTE — Telephone Encounter (Signed)
*  STAT* If patient is at the pharmacy, call can be transferred to refill team.   1. Which medications need to be refilled? (please list name of each medication and dose if known) Eliquis 2.5 mg bid  2. Which pharmacy/location (including street and city if local pharmacy) is medication to be sent to? [facility] in [place]   3. Do they need a 30 day or 90 day supply? 18  Originally sent in by ED doctor possibly

## 2020-03-01 ENCOUNTER — Other Ambulatory Visit: Payer: Self-pay

## 2020-03-01 DIAGNOSIS — I10 Essential (primary) hypertension: Secondary | ICD-10-CM

## 2020-03-01 MED ORDER — LABETALOL HCL 100 MG PO TABS: 150.0000 mg | ORAL_TABLET | Freq: Two times a day (BID) | ORAL | 0 refills | Status: DC

## 2020-03-03 ENCOUNTER — Ambulatory Visit (INDEPENDENT_AMBULATORY_CARE_PROVIDER_SITE_OTHER): Payer: Medicare Other | Admitting: Hospice and Palliative Medicine

## 2020-03-03 ENCOUNTER — Encounter: Payer: Self-pay | Admitting: Hospice and Palliative Medicine

## 2020-03-03 DIAGNOSIS — M545 Low back pain, unspecified: Secondary | ICD-10-CM | POA: Diagnosis not present

## 2020-03-03 DIAGNOSIS — M419 Scoliosis, unspecified: Secondary | ICD-10-CM

## 2020-03-03 DIAGNOSIS — M479 Spondylosis, unspecified: Secondary | ICD-10-CM | POA: Diagnosis not present

## 2020-03-03 DIAGNOSIS — M79604 Pain in right leg: Secondary | ICD-10-CM

## 2020-03-03 DIAGNOSIS — Z0001 Encounter for general adult medical examination with abnormal findings: Secondary | ICD-10-CM | POA: Diagnosis not present

## 2020-03-03 DIAGNOSIS — I10 Essential (primary) hypertension: Secondary | ICD-10-CM

## 2020-03-03 MED ORDER — GABAPENTIN 100 MG PO CAPS: 100.0000 mg | ORAL_CAPSULE | Freq: Every day | ORAL | 0 refills | Status: DC

## 2020-03-03 MED ORDER — PREDNISONE 5 MG PO TABS: 5.0000 mg | ORAL_TABLET | Freq: Every day | ORAL | 0 refills | Status: DC

## 2020-03-03 NOTE — Progress Notes (Signed)
[facility], [place] [address]  Internal MEDICINE  Office Visit Note  Patient Name: [name]  [id number]  [id number]  Date of Service: 03/07/2020  Chief Complaint  Patient presents with  . Annual Exam    medications, neck pain into ear  . Hypertension  . Quality Metric Gaps    PNA     HPI Pt is here for routine health maintenance examination Except for her ongoing pain, she feels that she has been doing fairly well We have tried multiple pain management therapies none of which has provided much relief from her pain Last visit we d/c's all oral pain medications and tried 12.5 mcg fentanyl patches--she says she found no relief in pain but patches made her brain foggy Her daughter accompanied her today and has requested her mother to be tried on gabapentin--back pain does radiate down her bilateral legs, front and back Ms. Stepanian continues to live [employer] decline home health services, discussed with her and her daughter about high fall risk concerns especially due to her ongoing use of anticoagulation therapy Reviewed recent labs--levels remain stable Not having further mammogram or colonoscopy testing Discussed in depth with her and her daughter that plan of care will be optimized to provide relief from pain and safety from falls    Current Medication: Outpatient Encounter Medications as of 03/03/2020  Medication Sig  . amLODipine (NORVASC) 5 MG tablet TAKE 1 TABLET BY MOUTH AT BEDTIME FOR HYPERTENSION  . apixaban (ELIQUIS) 2.5 MG TABS tablet Take 1 tablet (2.5 mg total) by mouth 2 (two) times daily.  Marland Kitchen Apoaequorin (PREVAGEN PO) Take by mouth daily.  . Ascorbic Acid (VITAMIN C) 1000 MG tablet Take 1,000 mg by mouth daily.  . Biotin 5000 MCG TABS Take by mouth daily.  . calcium-vitamin D (OSCAL WITH D) 500-200 MG-UNIT tablet Take 1 tablet by mouth daily with breakfast.  . cholecalciferol (VITAMIN D) 1000 units tablet Take 1,000  Units by mouth daily.  . clonazePAM (KLONOPIN) 1 MG tablet Take 1 tablet (1 mg total) by mouth at bedtime as needed for anxiety.  . cloNIDine (CATAPRES) 0.1 MG tablet Take 1 tablet (0.1 mg total) by mouth at bedtime.  . irbesartan (AVAPRO) 300 MG tablet Take 1 tablet (300 mg total) by mouth daily.  Marland Kitchen labetalol (NORMODYNE) 100 MG tablet Take 1.5 tablets (150 mg total) by mouth 2 (two) times daily.  . montelukast (SINGULAIR) 10 MG tablet Take 1 tablet (10 mg total) by mouth daily.  Vladimir Faster Glycol-Propyl Glycol (SYSTANE OP) Apply to eye daily.  Marland Kitchen PROAIR HFA 108 (90 Base) MCG/ACT inhaler Inhale 2 puffs into the lungs every 4 (four) hours as needed.   . vitamin B-12 (CYANOCOBALAMIN) 1000 MCG tablet Take 1,000 mcg by mouth daily.  . [DISCONTINUED] fentaNYL (DURAGESIC) 12 MCG/HR Place 1 patch onto the skin every 3 (three) days.  Marland Kitchen gabapentin (NEURONTIN) 100 MG capsule Take 1 capsule (100 mg total) by mouth at bedtime.  . predniSONE (DELTASONE) 5 MG tablet Take 1 tablet (5 mg total) by mouth daily with breakfast.   No facility-administered encounter medications on file as of 03/03/2020.    Surgical History: Past Surgical History:  Procedure Laterality Date  . ABDOMINAL HYSTERECTOMY    . CATARACT EXTRACTION W/PHACO Right 07/01/2019   Procedure: CATARACT EXTRACTION PHACO AND INTRAOCULAR LENS PLACEMENT (IOC) RIGHT 9.25 01:14.7 ;  Surgeon: Birder Robson, MD;  Location: [facility];  Service: Ophthalmology;  Laterality: Right;  . CATARACT EXTRACTION  W/PHACO Left 09/09/2019   Procedure: CATARACT EXTRACTION PHACO AND INTRAOCULAR LENS PLACEMENT (IOC) LEFT 8.29  01:03.3;  Surgeon: Birder Robson, MD;  Location: [facility];  Service: Ophthalmology;  Laterality: Left;  . COLONOSCOPY    . RENAL ARTERY STENT    . TONSILLECTOMY      Medical History: Past Medical History:  Diagnosis Date  . Asthma    in past  . Heart murmur    Pt states she has Heart Murmur  . Hypertension   .  Lower leg injury 04/24/2016   08/06/17- pt reports legs still not completely healed  . Renal insufficiency    10%-Right, 90%-Left (per pt)  . Skin cancer     Family History: Family History  Problem Relation Age of Onset  . Hypertension Mother     Review of Systems  Constitutional: Negative for chills, diaphoresis and fatigue.  HENT: Negative for ear pain, postnasal drip and sinus pressure.   Eyes: Negative for photophobia, discharge, redness, itching and visual disturbance.  Respiratory: Negative for cough, shortness of breath and wheezing.   Cardiovascular: Negative for chest pain, palpitations and leg swelling.  Gastrointestinal: Negative for abdominal pain, constipation, diarrhea, nausea and vomiting.  Genitourinary: Negative for dysuria and flank pain.  Musculoskeletal: Positive for arthralgias, back pain, gait problem, neck pain and neck stiffness.       Buttock pain, pain in bilateral lower extremities  Skin: Negative for color change.  Allergic/Immunologic: Negative for environmental allergies and food allergies.  Neurological: Negative for dizziness and headaches.  Hematological: Does not bruise/bleed easily.  Psychiatric/Behavioral: Negative for agitation, behavioral problems (depression) and hallucinations.     Vital Signs: BP (!) 152/76   Pulse 75   Temp (!) 97.5 F (36.4 C)   Resp 16   Ht 5' (1.524 m)   Wt 100 lb (45.4 kg)   SpO2 98%   BMI 19.53 kg/m    Physical Exam Vitals reviewed.  Constitutional:      Appearance: Normal appearance. She is normal weight.  HENT:     Mouth/Throat:     Mouth: Mucous membranes are moist.     Pharynx: Oropharynx is clear.  Cardiovascular:     Rate and Rhythm: Normal rate and regular rhythm.     Pulses: Normal pulses.     Heart sounds: Normal heart sounds.  Pulmonary:     Effort: Pulmonary effort is normal.     Breath sounds: Normal breath sounds.  Chest:     Breasts:        Right: Normal.        Left: Normal.   Abdominal:     General: Abdomen is flat.     Palpations: Abdomen is soft.  Musculoskeletal:     Cervical back: Rigidity present. Decreased range of motion.     Thoracic back: Decreased range of motion. Scoliosis present.     Lumbar back: Decreased range of motion. Scoliosis present.     Comments: Walks with cane, has trouble going from standing to sitting positions, must change positions on chair to be comfortable  Skin:    General: Skin is warm.  Neurological:     General: No focal deficit present.     Mental Status: She is alert and oriented to person, place, and time. Mental status is at baseline.  Psychiatric:        Mood and Affect: Mood normal.        Behavior: Behavior normal.  Thought Content: Thought content normal.      LABS: Recent Results (from the past 2160 hour(s))  Basic metabolic panel     Status: Abnormal   Collection Time: 12/11/19 11:51 AM  Result Value Ref Range   Sodium 142 135 - 145 mmol/L   Potassium 3.8 3.5 - 5.1 mmol/L   Chloride 109 98 - 111 mmol/L   CO2 26 22 - 32 mmol/L   Glucose, Bld 109 (H) 70 - 99 mg/dL    Comment: Glucose reference range applies only to samples taken after fasting for at least 8 hours.   BUN 19 8 - 23 mg/dL   Creatinine, Ser 1.01 (H) 0.44 - 1.00 mg/dL   Calcium 9.6 8.9 - 10.3 mg/dL   GFR calc non Af Amer 50 (L) >60 mL/min   GFR calc Af Amer 58 (L) >60 mL/min   Anion gap 7 5 - 15    Comment: Performed at [facility], [address] [place], [place] [address]  CBC     Status: Abnormal   Collection Time: 12/11/19 11:51 AM  Result Value Ref Range   WBC 7.6 4.0 - 10.5 K/uL   RBC 3.33 (L) 3.87 - 5.11 MIL/uL   Hemoglobin 10.5 (L) 12.0 - 15.0 g/dL   HCT 30.9 (L) 36 - 46 %   MCV 92.8 80.0 - 100.0 fL   MCH 31.5 26.0 - 34.0 pg   MCHC 34.0 30.0 - 36.0 g/dL   RDW 12.8 11.5 - 15.5 %   Platelets 247 150 - 400 K/uL   nRBC 0.0 0.0 - 0.2 %    Comment: Performed at [facility], [place], [place] [address]  Troponin I (High Sensitivity)     Status: None   Collection Time: 12/11/19 11:51 AM  Result Value Ref Range   Troponin I (High Sensitivity) 12 <18 ng/L    Comment: (NOTE) Elevated high sensitivity troponin I (hsTnI) values and significant  changes across serial measurements may suggest ACS but many other  chronic and acute conditions are known to elevate hsTnI results.  Refer to the "Links" section for chest pain algorithms and additional  guidance. Performed at [facility], [address] [place], [place] [address]   Magnesium     Status: None   Collection Time: 12/11/19 11:51 AM  Result Value Ref Range   Magnesium 1.8 1.7 - 2.4 mg/dL    Comment: Performed at [facility], [address] [place], [place] [address]  Brain natriuretic peptide     Status: Abnormal   Collection Time: 12/11/19 11:51 AM  Result Value Ref Range   B Natriuretic Peptide 227.9 (H) 0.0 - 100.0 pg/mL    Comment: Performed at [facility], [address] [place],  [address]  ECHOCARDIOGRAM COMPLETE     Status: None   Collection Time: 12/17/19  2:45 PM  Result Value Ref Range   AR max vel 1.13 cm2   AV Peak grad 23.5 mmHg   Ao pk vel 2.43 m/s   S' Lateral 2.40 cm   Area-P 1/2 5.84 cm2   AV Area VTI 1.06 cm2   AV Mean grad 13.3 mmHg   Single Plane A4C EF 50.8 %   Single Plane A2C EF 49.6 %   Calc EF 49.2 %   AV Area mean vel 1.10 cm2  CBC     Status: Abnormal   Collection Time: 12/25/19  3:18 PM  Result Value Ref Range  WBC 8.1 3.4 - 10.8 x10E3/uL   RBC 3.36 (L) 3.77 - 5.28 x10E6/uL   Hemoglobin 10.5 (L) 11.1 - 15.9 g/dL   Hematocrit 31.6 (L) 34.0 - 46.6 %   MCV 94 79 - 97 fL   MCH 31.3 26.6 - 33.0 pg   MCHC 33.2 31 - 35 g/dL   RDW 12.3 11.7 - 15.4 %   Platelets 250 150 - 450 X65V3/ZS  Basic metabolic panel     Status: Abnormal   Collection Time: 12/25/19  3:18 PM  Result Value Ref Range   Glucose 97 65 - 99 mg/dL   BUN 21 8 - 27  mg/dL   Creatinine, Ser 1.04 (H) 0.57 - 1.00 mg/dL   GFR calc non Af Amer 48 (L) >59 mL/min/1.73   GFR calc Af Amer 55 (L) >59 mL/min/1.73    Comment: **[facility] currently reports eGFR in compliance with the current**   recommendations of the [employer]. [facility] will   update reporting as new guidelines are published from the NKF-ASN   Task force.    BUN/Creatinine Ratio 20 12 - 28   Sodium 144 134 - 144 mmol/L   Potassium 4.3 3.5 - 5.2 mmol/L   Chloride 106 96 - 106 mmol/L   CO2 22 20 - 29 mmol/L   Calcium 10.3 8.7 - 10.3 mg/dL  TSH     Status: None   Collection Time: 12/25/19  3:18 PM  Result Value Ref Range   TSH 2.860 0.450 - 4.500 uIU/mL  Comprehensive metabolic panel     Status: Abnormal   Collection Time: 12/29/19 11:01 AM  Result Value Ref Range   Sodium 141 135 - 145 mmol/L   Potassium 3.8 3.5 - 5.1 mmol/L   Chloride 106 98 - 111 mmol/L   CO2 27 22 - 32 mmol/L   Glucose, Bld 111 (H) 70 - 99 mg/dL    Comment: Glucose reference range applies only to samples taken after fasting for at least 8 hours.   BUN 25 (H) 8 - 23 mg/dL   Creatinine, Ser 1.02 (H) 0.44 - 1.00 mg/dL   Calcium 9.6 8.9 - 10.3 mg/dL   Total Protein 6.8 6.5 - 8.1 g/dL   Albumin 4.1 3.5 - 5.0 g/dL   AST 19 15 - 41 U/L   ALT 15 0 - 44 U/L   Alkaline Phosphatase 117 38 - 126 U/L   Total Bilirubin 0.8 0.3 - 1.2 mg/dL   GFR calc non Af Amer 49 (L) >60 mL/min   GFR calc Af Amer 57 (L) >60 mL/min   Anion gap 8 5 - 15    Comment: Performed at [facility] [address] [place], [place] [address]  CBC     Status: Abnormal   Collection Time: 12/29/19 11:01 AM  Result Value Ref Range   WBC 11.7 (H) 4.0 - 10.5 K/uL   RBC 3.49 (L) 3.87 - 5.11 MIL/uL   Hemoglobin 11.0 (L) 12.0 - 15.0 g/dL   HCT 32.5 (L) 36 - 46 %   MCV 93.1 80.0 - 100.0 fL   MCH 31.5 26.0 - 34.0 pg   MCHC 33.8 30.0 - 36.0 g/dL   RDW 12.6 11.5 - 15.5 %   Platelets 244 150 - 400 K/uL   nRBC 0.0 0.0 - 0.2 %     Comment: Performed at [facility], [address], [place], [place] [address]  Urinalysis, Complete w Microscopic     Status: Abnormal  Collection Time: 12/29/19  1:17 PM  Result Value Ref Range   Color, Urine YELLOW (A) YELLOW   APPearance CLOUDY (A) CLEAR   Specific Gravity, Urine 1.013 1.005 - 1.030   pH 7.0 5.0 - 8.0   Glucose, UA NEGATIVE NEGATIVE mg/dL   Hgb urine dipstick SMALL (A) NEGATIVE   Bilirubin Urine NEGATIVE NEGATIVE   Ketones, ur NEGATIVE NEGATIVE mg/dL   Protein, ur NEGATIVE NEGATIVE mg/dL   Nitrite NEGATIVE NEGATIVE   Leukocytes,Ua MODERATE (A) NEGATIVE   RBC / HPF 6-10 0 - 5 RBC/hpf   WBC, UA 0-5 0 - 5 WBC/hpf   Bacteria, UA FEW (A) NONE SEEN   Squamous Epithelial / LPF 0-5 0 - 5   Mucus PRESENT     Comment: Performed at [facility], [address], [place],  [address]    Assessment/Plan: 1. Encounter for routine adult health examination with abnormal findings Frail 86 year old female, up to date on age appropriate PHM  2. Scoliosis of lumbar spine, unspecified scoliosis type Multiple pain management options have failed Gabapentin for sciatica pain, radiating to bilateral lower extremities Prednisone to help with inflammation causing pain Face to face encounter for raised toilet seat to help prevent falls secondary to pain with lowering herself down onto toilet--fall would be detrimental due to her age, risk of hip fracture, brain hemorrhage due to anticoagulation therapy - gabapentin (NEURONTIN) 100 MG capsule; Take 1 capsule (100 mg total) by mouth at bedtime.  Dispense: 90 capsule; Refill: 0 - predniSONE (DELTASONE) 5 MG tablet; Take 1 tablet (5 mg total) by mouth daily with breakfast.  Dispense: 30 tablet; Refill: 0 - For home use only DME Other see comment  3. Low back pain radiating to right lower extremity Multiple pain management options have failed Gabapentin for sciatica pain, radiating to bilateral lower  extremities Prednisone to help with inflammation causing pain Face to face encounter for raised toilet seat to help prevent falls secondary to pain with lowering herself down onto toilet--fall would be detrimental due to her age, risk of hip fracture, brain hemorrhage due to anticoagulation therapy - gabapentin (NEURONTIN) 100 MG capsule; Take 1 capsule (100 mg total) by mouth at bedtime.  Dispense: 90 capsule; Refill: 0 - predniSONE (DELTASONE) 5 MG tablet; Take 1 tablet (5 mg total) by mouth daily with breakfast.  Dispense: 30 tablet; Refill: 0 - For home use only DME Other see comment  4. Spondylosis Multiple pain management options have failed Gabapentin for sciatica pain, radiating to bilateral lower extremities Prednisone to help with inflammation causing pain Face to face encounter for raised toilet seat to help prevent falls secondary to pain with lowering herself down onto toilet--fall would be detrimental due to her age, risk of hip fracture, brain hemorrhage due to anticoagulation therapy - gabapentin (NEURONTIN) 100 MG capsule; Take 1 capsule (100 mg total) by mouth at bedtime.  Dispense: 90 capsule; Refill: 0 - predniSONE (DELTASONE) 5 MG tablet; Take 1 tablet (5 mg total) by mouth daily with breakfast.  Dispense: 30 tablet; Refill: 0 - For home use only DME Other see comment  5. Essential hypertension BP elevated, slight improvement on manual recheck, continue with current therapy, will not adjust therapy at this time due complications associated with hypotension  General Counseling: [name] verbalizes understanding of the findings of todays visit and agrees with plan of treatment. I have discussed any further diagnostic evaluation that may be needed or ordered today. We also reviewed her medications today.  she has been encouraged to call the office with any questions or concerns that should arise related to todays visit.    Counseling:    Orders Placed This Encounter   Procedures  . For home use only DME Other see comment    Meds ordered this encounter  Medications  . gabapentin (NEURONTIN) 100 MG capsule    Sig: Take 1 capsule (100 mg total) by mouth at bedtime.    Dispense:  90 capsule    Refill:  0  . predniSONE (DELTASONE) 5 MG tablet    Sig: Take 1 tablet (5 mg total) by mouth daily with breakfast.    Dispense:  30 tablet    Refill:  0    Total time spent: 45 Minutes  Time spent includes review of chart, medications, test results, and follow up plan with the patient.   This patient was seen by Casey Burkitt AGNP-C Collaboration with Dr [name] as a part of collaborative care agreement   Tanna Furry. [facility] Internal Medicine

## 2020-03-05 ENCOUNTER — Other Ambulatory Visit: Payer: Self-pay

## 2020-03-05 ENCOUNTER — Telehealth: Payer: Self-pay

## 2020-03-05 NOTE — Telephone Encounter (Signed)
Called [employer] order in process

## 2020-03-07 ENCOUNTER — Encounter: Payer: Self-pay | Admitting: Hospice and Palliative Medicine

## 2020-03-10 ENCOUNTER — Other Ambulatory Visit: Payer: Self-pay | Admitting: Hospice and Palliative Medicine

## 2020-03-10 NOTE — Progress Notes (Signed)
[name] called and discussed that her pain is not improved on 100 mg Gabapentin at night and 5 mg Prednisone daily. We will try 100 mg BID and 200 mg at night--she says the gabapentin has not made her drowsy and has not experienced any other side effects Discussed she should avoid driving while taking this increased dose of Gabapentin She can continue with Prednisone as well as OTC acetominophen

## 2020-03-25 ENCOUNTER — Other Ambulatory Visit: Payer: Self-pay | Admitting: Hospice and Palliative Medicine

## 2020-03-25 DIAGNOSIS — M545 Low back pain, unspecified: Secondary | ICD-10-CM

## 2020-03-25 DIAGNOSIS — M479 Spondylosis, unspecified: Secondary | ICD-10-CM

## 2020-03-25 DIAGNOSIS — M419 Scoliosis, unspecified: Secondary | ICD-10-CM

## 2020-03-25 DIAGNOSIS — M79604 Pain in right leg: Secondary | ICD-10-CM

## 2020-03-26 NOTE — Telephone Encounter (Signed)
Can send this pres if she need to continue

## 2020-04-06 ENCOUNTER — Other Ambulatory Visit: Payer: Self-pay | Admitting: Hospice and Palliative Medicine

## 2020-04-06 DIAGNOSIS — M419 Scoliosis, unspecified: Secondary | ICD-10-CM

## 2020-04-06 DIAGNOSIS — M479 Spondylosis, unspecified: Secondary | ICD-10-CM

## 2020-04-06 DIAGNOSIS — M79604 Pain in right leg: Secondary | ICD-10-CM

## 2020-04-06 DIAGNOSIS — M545 Low back pain, unspecified: Secondary | ICD-10-CM

## 2020-04-06 MED ORDER — GABAPENTIN 100 MG PO CAPS: ORAL_CAPSULE | ORAL | 3 refills | Status: DC

## 2020-04-12 ENCOUNTER — Other Ambulatory Visit: Payer: Self-pay

## 2020-04-12 DIAGNOSIS — I10 Essential (primary) hypertension: Secondary | ICD-10-CM

## 2020-04-12 MED ORDER — IRBESARTAN 300 MG PO TABS: 300.0000 mg | ORAL_TABLET | Freq: Every day | ORAL | 1 refills | Status: DC

## 2020-04-19 ENCOUNTER — Other Ambulatory Visit: Payer: Self-pay | Admitting: Hospice and Palliative Medicine

## 2020-04-19 DIAGNOSIS — M545 Low back pain, unspecified: Secondary | ICD-10-CM

## 2020-04-19 DIAGNOSIS — M479 Spondylosis, unspecified: Secondary | ICD-10-CM

## 2020-04-19 DIAGNOSIS — M419 Scoliosis, unspecified: Secondary | ICD-10-CM

## 2020-04-19 DIAGNOSIS — M79604 Pain in right leg: Secondary | ICD-10-CM

## 2020-04-30 ENCOUNTER — Other Ambulatory Visit: Payer: Self-pay

## 2020-04-30 DIAGNOSIS — J3089 Other allergic rhinitis: Secondary | ICD-10-CM

## 2020-04-30 MED ORDER — MONTELUKAST SODIUM 10 MG PO TABS: 10.0000 mg | ORAL_TABLET | Freq: Every day | ORAL | 3 refills | Status: DC

## 2020-05-13 ENCOUNTER — Ambulatory Visit: Payer: Medicare Other | Admitting: Hospice and Palliative Medicine

## 2020-05-16 ENCOUNTER — Other Ambulatory Visit: Payer: Self-pay | Admitting: Hospice and Palliative Medicine

## 2020-05-16 DIAGNOSIS — M79604 Pain in right leg: Secondary | ICD-10-CM

## 2020-05-16 DIAGNOSIS — M419 Scoliosis, unspecified: Secondary | ICD-10-CM

## 2020-05-16 DIAGNOSIS — M479 Spondylosis, unspecified: Secondary | ICD-10-CM

## 2020-05-24 ENCOUNTER — Other Ambulatory Visit: Payer: Self-pay

## 2020-05-24 ENCOUNTER — Ambulatory Visit: Payer: Medicare Other | Attending: Internal Medicine

## 2020-05-24 ENCOUNTER — Encounter: Payer: Self-pay | Admitting: Hospice and Palliative Medicine

## 2020-05-24 ENCOUNTER — Ambulatory Visit: Payer: Medicare Other | Admitting: Hospice and Palliative Medicine

## 2020-05-24 VITALS — BP 138/82 | HR 75 | Temp 97.3°F | Resp 16 | Ht 60.0 in | Wt 105.6 lb

## 2020-05-24 DIAGNOSIS — F411 Generalized anxiety disorder: Secondary | ICD-10-CM | POA: Diagnosis not present

## 2020-05-24 DIAGNOSIS — M545 Low back pain, unspecified: Secondary | ICD-10-CM

## 2020-05-24 DIAGNOSIS — I10 Essential (primary) hypertension: Secondary | ICD-10-CM

## 2020-05-24 DIAGNOSIS — M79604 Pain in right leg: Secondary | ICD-10-CM

## 2020-05-24 DIAGNOSIS — M419 Scoliosis, unspecified: Secondary | ICD-10-CM | POA: Diagnosis not present

## 2020-05-24 DIAGNOSIS — M479 Spondylosis, unspecified: Secondary | ICD-10-CM | POA: Diagnosis not present

## 2020-05-24 DIAGNOSIS — Z23 Encounter for immunization: Secondary | ICD-10-CM

## 2020-05-24 MED ORDER — CLONAZEPAM 1 MG PO TABS: 1.0000 mg | ORAL_TABLET | Freq: Every evening | ORAL | 2 refills | Status: DC | PRN

## 2020-05-24 MED ORDER — PREDNISONE 5 MG PO TABS: ORAL_TABLET | ORAL | 1 refills | Status: DC

## 2020-05-24 NOTE — Progress Notes (Signed)
[facility] [place], [place] [address]  Internal MEDICINE  Office Visit Note  Patient Name: [name]  [id number]  OM:[id number]  Date of Service: 05/29/2020  Chief Complaint  Patient presents with  . Follow-up    Discuss meds, neck hurts, has felt pain for years but is getting worse, head goes to sleep on left side, tingles  . Asthma  . Hypertension    HPI Patient is here for routine follow-up Daughter is present today during exam Followed closely for chronic neck, low back and bilateral leg pain We have tried multiple different therapy options without success She is currently taking gabapentin 100 mg in the morning and 200 mg at bedtime She explains that on days she has to drive for appointments she does not take her morning dose and it does make her somewhat drowsy She is also taking 10 mg prednisone daily Continues to take OTC acetaminophen daily  She explains that this regimen has been the most successful at alleviating her pain to a certain degree--pain is never eliminated completely but is somewhat relieved She can also noticed that she has improved range of motion in her neck on this regimen She continues to want to remain independent as long as possible--lives alone and is 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 amLODipine (NORVASC) 5 MG tablet TAKE 1 TABLET BY MOUTH AT BEDTIME FOR HYPERTENSION  . apixaban (ELIQUIS) 2.5 MG TABS tablet Take 1 tablet (2.5 mg total) by mouth 2 (two) times daily.  Marland Kitchen Apoaequorin (PREVAGEN PO) Take by mouth  daily.  . Ascorbic Acid (VITAMIN C) 1000 MG tablet Take 1,000 mg by mouth daily.  . Biotin 5000 MCG TABS Take by mouth daily.  . calcium-vitamin D (OSCAL WITH D) 500-200 MG-UNIT tablet Take 1 tablet by mouth daily with breakfast.  . cholecalciferol (VITAMIN D) 1000 units tablet Take 1,000 Units by mouth daily.  . clonazePAM (KLONOPIN) 1 MG tablet Take 1 tablet (1 mg total) by mouth at bedtime as needed for anxiety.  . cloNIDine (CATAPRES) 0.1 MG tablet Take 1 tablet (0.1 mg total) by mouth at bedtime.  . gabapentin (NEURONTIN) 100 MG capsule Take one tablet in AM, one tablet in PM and two tablets at night.  . irbesartan (AVAPRO) 300 MG tablet Take 1 tablet (300 mg total) by mouth daily.  Marland Kitchen labetalol (NORMODYNE) 100 MG tablet Take 1.5 tablets (150 mg total) by mouth 2 (two) times daily.  . montelukast (SINGULAIR) 10 MG tablet Take 1 tablet (10 mg total) by mouth daily.  Vladimir Faster Glycol-Propyl Glycol (SYSTANE OP) Apply to eye daily.  . predniSONE (DELTASONE) 5 MG tablet TAKE 1 TABLET BY MOUTH EVERY DAY WITH BREAKFAST  . PROAIR HFA 108 (90 Base) MCG/ACT inhaler Inhale 2 puffs into the lungs every 4 (four) hours as needed.   . vitamin B-12 (CYANOCOBALAMIN) 1000 MCG tablet Take 1,000 mcg by mouth daily.  . [DISCONTINUED] clonazePAM (KLONOPIN) 1 MG tablet Take 1 tablet (1 mg total) by mouth at bedtime as needed for anxiety.  . [DISCONTINUED] predniSONE (DELTASONE) 5 MG tablet TAKE 1 TABLET  BY MOUTH EVERY DAY WITH BREAKFAST   No facility-administered encounter medications on file as of 05/24/2020.    Surgical History: Past Surgical History:  Procedure Laterality Date  . ABDOMINAL HYSTERECTOMY    . CATARACT EXTRACTION W/PHACO Right 07/01/2019   Procedure: CATARACT EXTRACTION PHACO AND INTRAOCULAR LENS PLACEMENT (IOC) RIGHT 9.25 01:14.7 ;  Surgeon: Galen Manila, MD;  Location: [facility];  Service: Ophthalmology;  Laterality: Right;  . CATARACT EXTRACTION W/PHACO Left 09/09/2019    Procedure: CATARACT EXTRACTION PHACO AND INTRAOCULAR LENS PLACEMENT (IOC) LEFT 8.29  01:03.3;  Surgeon: Galen Manila, MD;  Location: [facility];  Service: Ophthalmology;  Laterality: Left;  . COLONOSCOPY    . RENAL ARTERY STENT    . TONSILLECTOMY      Medical History: Past Medical History:  Diagnosis Date  . A-fib (HCC)   . Asthma    in past  . Heart murmur    Pt states she has Heart Murmur  . Hypertension   . Lower leg injury 04/24/2016   08/06/17- pt reports legs still not completely healed  . Renal insufficiency    10%-Right, 90%-Left (per pt)  . Skin cancer     Family History: Family History  Problem Relation Age of Onset  . Hypertension Mother     Social History   Socioeconomic History  . Marital status: Widowed    Spouse name: Not on file  . Number of children: Not on file  . Years of education: Not on file  . Highest education level: Not on file  Occupational History  . Not on file  Tobacco Use  . Smoking status: Never Smoker  . Smokeless tobacco: Never Used  Vaping Use  . Vaping Use: Never used  Substance and Sexual Activity  . Alcohol use: No  . Drug use: No  . Sexual activity: Not on file  Other Topics Concern  . Not on file  Social History Narrative  . Not on file   Social Determinants of Health   Financial Resource Strain: Not on file  Food Insecurity: Not on file  Transportation Needs: Not on file  Physical Activity: Not on file  Stress: Not on file  Social Connections: Not on file  Intimate Partner Violence: Not on file      Review of Systems  Constitutional: Negative for chills, diaphoresis and fatigue.  HENT: Negative for ear pain, postnasal drip and sinus pressure.   Eyes: Negative for photophobia, discharge, redness, itching and visual disturbance.  Respiratory: Negative for cough, shortness of breath and wheezing.   Cardiovascular: Negative for chest pain, palpitations and leg swelling.  Gastrointestinal: Negative  for abdominal pain, constipation, diarrhea, nausea and vomiting.  Genitourinary: Negative for dysuria and flank pain.  Musculoskeletal: Positive for arthralgias, back pain, gait problem, neck pain and neck stiffness.  Skin: Negative for color change.  Allergic/Immunologic: Negative for environmental allergies and food allergies.  Neurological: Negative for dizziness and headaches.  Hematological: Does not bruise/bleed easily.  Psychiatric/Behavioral: Negative for agitation, behavioral problems (depression) and hallucinations.    Vital Signs: BP 138/82   Pulse 75   Temp (!) 97.3 F (36.3 C)   Resp 16   Ht 5' (1.524 m)   Wt 105 lb 9.6 oz (47.9 kg)   SpO2 98%   BMI 20.62 kg/m    Physical Exam Vitals reviewed.  Constitutional:      Appearance: Normal appearance. She is normal weight.  Cardiovascular:     Rate and Rhythm: Normal rate and  regular rhythm.     Pulses: Normal pulses.     Heart sounds: Normal heart sounds.  Pulmonary:     Effort: Pulmonary effort is normal.     Breath sounds: Normal breath sounds.  Abdominal:     General: Abdomen is flat.  Musculoskeletal:     Comments: At baseline, using cane today for ambulation assistance  Skin:    General: Skin is warm.  Neurological:     General: No focal deficit present.     Mental Status: She is alert and oriented to person, place, and time. Mental status is at baseline.  Psychiatric:        Mood and Affect: Mood normal.        Behavior: Behavior normal.        Thought Content: Thought content normal.        Judgment: Judgment normal.    Assessment/Plan: 1. Scoliosis of lumbar spine, unspecified scoliosis type Continue with current therapy as there has been somewhat of an improvement in symptoms, declines, declines home health services Goal for pain management is prevention of falls and improved quality of life - predniSONE (DELTASONE) 5 MG tablet; TAKE 1 TABLET BY MOUTH EVERY DAY WITH BREAKFAST  Dispense: 90  tablet; Refill: 1  2. Low back pain radiating to right lower extremity Continue with current therapy as there has been somewhat of an improvement in symptoms, declines, declines home health services Goal for pain management is prevention of falls and improved quality of life - predniSONE (DELTASONE) 5 MG tablet; TAKE 1 TABLET BY MOUTH EVERY DAY WITH BREAKFAST  Dispense: 90 tablet; Refill: 1  3. Spondylosis Continue with current therapy as there has been somewhat of an improvement in symptoms, declines, declines home health services Goal for pain management is prevention of falls and improved quality of life - predniSONE (DELTASONE) 5 MG tablet; TAKE 1 TABLET BY MOUTH EVERY DAY WITH BREAKFAST  Dispense: 90 tablet; Refill: 1  4. GAD (generalized anxiety disorder) Continue clonazepam as needed, using 0.25 mg as needed on nights she is feeling anxious and cannot sleep - clonazePAM (KLONOPIN) 1 MG tablet; Take 1 tablet (1 mg total) by mouth at bedtime as needed for anxiety.  Dispense: 30 tablet; Refill: 2  5. Essential hypertension BP and HR stable today--slightly elevated on initial check, improved on manual recheck, continue to monitor Avoid hypotension increasing risk of falls  General Counseling: Jadynn verbalizes understanding of the findings of todays visit and agrees with plan of treatment. I have discussed any further diagnostic evaluation that may be needed or ordered today. We also reviewed her medications today. she has been encouraged to call the office with any questions or concerns that should arise related to todays visit.   Meds ordered this encounter  Medications  . predniSONE (DELTASONE) 5 MG tablet    Sig: TAKE 1 TABLET BY MOUTH EVERY DAY WITH BREAKFAST    Dispense:  90 tablet    Refill:  1  . clonazePAM (KLONOPIN) 1 MG tablet    Sig: Take 1 tablet (1 mg total) by mouth at bedtime as needed for anxiety.    Dispense:  30 tablet    Refill:  2    Time spent: 30  Minutes Time spent includes review of chart, medications, test results and follow-up plan with the patient.  This patient was seen by Theodoro Grist AGNP-C in Collaboration with Dr [name] as a part of collaborative care agreement     Tanna Furry.  Danel Studzinski AGNP-C Internal medicine

## 2020-05-24 NOTE — Progress Notes (Signed)
   Covid-19 Vaccination Clinic  Name:  [name]    MRN: [id number] DOB: [birth year]  05/24/2020  Ms. [name] was observed post Covid-19 immunization for 15 minutes without incident. She was provided with Vaccine Information Sheet and instruction to access the V-Safe system.   Ms. [name] was instructed to call 911 with any severe reactions post vaccine: Marland Kitchen Difficulty breathing  . Swelling of face and throat  . A fast heartbeat  . A bad rash all over body  . Dizziness and weakness   Immunizations Administered    Name Date Dose VIS Date Route   Pfizer COVID-19 Vaccine 05/24/2020  3:58 PM 0.3 mL 03/17/2020 Intramuscular   Manufacturer: [employer], [employer]   Lot: [id number]   NDC: [address]-8

## 2020-05-29 ENCOUNTER — Encounter: Payer: Self-pay | Admitting: Hospice and Palliative Medicine

## 2020-05-31 ENCOUNTER — Other Ambulatory Visit: Payer: Self-pay

## 2020-05-31 DIAGNOSIS — I10 Essential (primary) hypertension: Secondary | ICD-10-CM

## 2020-05-31 MED ORDER — LABETALOL HCL 100 MG PO TABS: 150.0000 mg | ORAL_TABLET | Freq: Two times a day (BID) | ORAL | 1 refills | Status: DC

## 2020-07-12 ENCOUNTER — Other Ambulatory Visit: Payer: Self-pay

## 2020-07-12 MED ORDER — AMLODIPINE BESYLATE 5 MG PO TABS
ORAL_TABLET | ORAL | 1 refills | Status: DC
Start: 2020-07-12 — End: 2020-07-20
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
## 2020-07-20 ENCOUNTER — Other Ambulatory Visit: Payer: Self-pay

## 2020-07-20 ENCOUNTER — Ambulatory Visit: Payer: Medicare Other | Admitting: Cardiovascular Disease

## 2020-07-20 ENCOUNTER — Encounter: Payer: Self-pay | Admitting: Cardiovascular Disease

## 2020-07-20 VITALS — BP 184/80 | HR 67 | Ht 60.0 in | Wt 104.0 lb

## 2020-07-20 DIAGNOSIS — I350 Nonrheumatic aortic (valve) stenosis: Secondary | ICD-10-CM

## 2020-07-20 DIAGNOSIS — I10 Essential (primary) hypertension: Secondary | ICD-10-CM | POA: Diagnosis not present

## 2020-07-20 DIAGNOSIS — I480 Paroxysmal atrial fibrillation: Secondary | ICD-10-CM | POA: Diagnosis not present

## 2020-07-20 DIAGNOSIS — I739 Peripheral vascular disease, unspecified: Secondary | ICD-10-CM | POA: Diagnosis not present

## 2020-07-20 MED ORDER — CLONIDINE HCL 0.1 MG PO TABS: 0.1000 mg | ORAL_TABLET | Freq: Two times a day (BID) | ORAL | 3 refills | Status: DC

## 2020-07-20 NOTE — Patient Instructions (Addendum)
Ask pharmacy when pradaxa goes generic, blood thinner  Please increase clonidine up to 0.1 mg twice a day  Stop the amlodipine  Medication Instructions:  No changes  If you need a refill on your cardiac medications before your next appointment, please call your pharmacy.    Lab work: No new labs needed   If you have labs (blood work) drawn today and your tests are completely normal, you will receive your results only by:  [place] (if you have MyChart) OR  A paper copy in the mail If you have any lab test that is abnormal or we need to change your treatment, we will call you to review the results.   Testing/Procedures: No new testing needed   Follow-Up: At [facility], you and your health needs are our priority.  As part of our continuing mission to provide you with exceptional heart care, we have created designated Provider Care Teams.  These Care Teams include your primary Cardiologist (physician) and Advanced Practice Providers (APPs -  Physician Assistants and Nurse Practitioners) who all work together to provide you with the care you need, when you need it.   You will need a follow up appointment in 6 months, APP ok   Providers on your designated Care Team:    Murray Hodgkins, NP  Christell Faith, PA-C  Marrianne Mood, PA-C  Any Other Special Instructions Will Be Listed Below (If Applicable).  COVID-19 Vaccine Information can be found at: ShippingScam.co.uk For questions related to vaccine distribution or appointments, please email [email or web address] or call [phone].

## 2020-08-11 ENCOUNTER — Other Ambulatory Visit: Payer: Self-pay | Admitting: Physician Assistant

## 2020-08-11 ENCOUNTER — Other Ambulatory Visit: Payer: Self-pay | Admitting: Cardiovascular Disease

## 2020-08-11 NOTE — Telephone Encounter (Signed)
Refill Request.  

## 2020-08-11 NOTE — Telephone Encounter (Signed)
Pt's age 87, wt 47.2 kg, SCr 1.02, CrCl 27.86, last ov w/ TG 07/20/20.

## 2020-08-19 ENCOUNTER — Ambulatory Visit: Payer: Medicare Other | Admitting: Hospice and Palliative Medicine

## 2020-08-19 ENCOUNTER — Encounter: Payer: Self-pay | Admitting: Hospice and Palliative Medicine

## 2020-08-19 ENCOUNTER — Other Ambulatory Visit: Payer: Self-pay

## 2020-08-19 VITALS — BP 178/90 | HR 71 | Temp 97.8°F | Resp 16 | Ht 60.0 in | Wt 105.0 lb

## 2020-08-19 DIAGNOSIS — I10 Essential (primary) hypertension: Secondary | ICD-10-CM

## 2020-08-19 DIAGNOSIS — M5000 Cervical disc disorder with myelopathy, unspecified cervical region: Secondary | ICD-10-CM

## 2020-08-19 DIAGNOSIS — M419 Scoliosis, unspecified: Secondary | ICD-10-CM

## 2020-08-19 DIAGNOSIS — M545 Low back pain, unspecified: Secondary | ICD-10-CM

## 2020-08-19 DIAGNOSIS — M479 Spondylosis, unspecified: Secondary | ICD-10-CM

## 2020-08-19 MED ORDER — GABAPENTIN 100 MG PO CAPS: 200.0000 mg | ORAL_CAPSULE | Freq: Every day | ORAL | 1 refills | Status: DC

## 2020-08-19 MED ORDER — PREDNISONE 5 MG PO TABS: ORAL_TABLET | ORAL | 1 refills | Status: DC

## 2020-08-19 NOTE — Progress Notes (Signed)
[facility] [place], [place] [address]  Internal MEDICINE  Office Visit Note  Patient Name: [name]  [id number]  [id number]  Date of Service: 08/20/2020  Chief Complaint  Patient presents with  . Hypertension  . Asthma    HPI Patient is here for routine follow-up BP elevated today--has had recent follow-up with cardiology, reviewed medications with patient and compared office notes from cardiology visit, a few discrepancies Unclear of the dosing she is currently taking or irbesartan, taking, taking clonidine only once per day Daughter is concerned that she has become more drowsy during the day Continues to complain of chronic neck and back pain--currently taking gabapentin only at bedtime and uses OTC acetaminophen during the day  Denies chest pain, headaches, visual disturbances related to elevated BP today   Current Medication: Outpatient Encounter Medications as of 08/19/2020  Medication Sig  . Apoaequorin (PREVAGEN PO) Take by mouth daily.  . Ascorbic Acid (VITAMIN C) 1000 MG tablet Take 1,000 mg by mouth daily.  . Biotin 5000 MCG TABS Take by mouth daily.  . calcium-vitamin D (OSCAL WITH D) 500-200 MG-UNIT tablet Take 1 tablet by mouth daily with breakfast.  . cholecalciferol (VITAMIN D) 1000 units tablet Take 1,000 Units by mouth daily.  . clonazePAM (KLONOPIN) 1 MG tablet Take 1 tablet (1 mg total) by mouth at bedtime as needed for anxiety.  . cloNIDine (CATAPRES) 0.1 MG tablet Take 1 tablet (0.1 mg total) by mouth 2 (two) times daily.  Marland Kitchen ELIQUIS 2.5 MG TABS tablet TAKE 1 TABLET BY MOUTH TWICE A DAY  . irbesartan (AVAPRO) 150 MG tablet Take 150 mg by mouth in the morning and at bedtime.  Marland Kitchen labetalol (NORMODYNE) 100 MG tablet Take 1.5 tablets (150 mg total) by mouth 2 (two) times daily.  . montelukast (SINGULAIR) 10 MG tablet Take 1 tablet (10 mg total) by mouth daily.  Vladimir Faster Glycol-Propyl Glycol (SYSTANE OP) Apply to eye daily.   Marland Kitchen PROAIR HFA 108 (90 Base) MCG/ACT inhaler Inhale 2 puffs into the lungs every 4 (four) hours as needed.   . vitamin B-12 (CYANOCOBALAMIN) 1000 MCG tablet Take 1,000 mcg by mouth daily.  . [DISCONTINUED] gabapentin (NEURONTIN) 100 MG capsule Take 200 mg by mouth at bedtime.  . [DISCONTINUED] predniSONE (DELTASONE) 5 MG tablet TAKE 1 TABLET BY MOUTH EVERY DAY WITH BREAKFAST  . [DISCONTINUED] traMADol (ULTRAM) 50 MG tablet tramadol 50 mg tablet  . gabapentin (NEURONTIN) 100 MG capsule Take 2 capsules (200 mg total) by mouth at bedtime.  . predniSONE (DELTASONE) 5 MG tablet TAKE 1 TABLET BY MOUTH EVERY DAY WITH BREAKFAST   No facility-administered encounter medications on file as of 08/19/2020.    Surgical History: Past Surgical History:  Procedure Laterality Date  . ABDOMINAL HYSTERECTOMY    . CATARACT EXTRACTION W/PHACO Right 07/01/2019   Procedure: CATARACT EXTRACTION PHACO AND INTRAOCULAR LENS PLACEMENT (IOC) RIGHT 9.25 01:14.7 ;  Surgeon: Birder Robson, MD;  Location: [facility];  Service: Ophthalmology;  Laterality: Right;  . CATARACT EXTRACTION W/PHACO Left 09/09/2019   Procedure: CATARACT EXTRACTION PHACO AND INTRAOCULAR LENS PLACEMENT (IOC) LEFT 8.29  01:03.3;  Surgeon: Birder Robson, MD;  Location: [facility];  Service: Ophthalmology;  Laterality: Left;  . COLONOSCOPY    . RENAL ARTERY STENT    . TONSILLECTOMY      Medical History: Past Medical History:  Diagnosis Date  . A-fib ([place])   . Asthma    in past  . Heart murmur  Pt states she has Heart Murmur  . Hypertension   . Lower leg injury 04/24/2016   08/06/17- pt reports legs still not completely healed  . Renal insufficiency    10%-Right, 90%-Left (per pt)  . Skin cancer     Family History: Family History  Problem Relation Age of Onset  . Hypertension Mother     Social History   Socioeconomic History  . Marital status: Widowed    Spouse name: Not on file  . Number of children: Not  on file  . Years of education: Not on file  . Highest education level: Not on file  Occupational History  . Not on file  Tobacco Use  . Smoking status: Never Smoker  . Smokeless tobacco: Never Used  Vaping Use  . Vaping Use: Never used  Substance and Sexual Activity  . Alcohol use: No  . Drug use: No  . Sexual activity: Not on file  Other Topics Concern  . Not on file  Social History Narrative  . Not on file   Social Determinants of Health   Financial Resource Strain: Not on file  Food Insecurity: Not on file  Transportation Needs: Not on file  Physical Activity: Not on file  Stress: Not on file  Social Connections: Not on file  Intimate Partner Violence: Not on file      Review of Systems  Constitutional: Negative for chills, diaphoresis and fatigue.  HENT: Negative for ear pain, postnasal drip and sinus pressure.   Eyes: Negative for photophobia, discharge, redness, itching and visual disturbance.  Respiratory: Negative for cough, shortness of breath and wheezing.   Cardiovascular: Negative for chest pain, palpitations and leg swelling.  Gastrointestinal: Negative for abdominal pain, constipation, diarrhea, nausea and vomiting.  Genitourinary: Negative for dysuria and flank pain.  Musculoskeletal: Positive for arthralgias, back pain, neck pain and neck stiffness. Negative for gait problem.  Skin: Negative for color change.  Allergic/Immunologic: Negative for environmental allergies and food allergies.  Neurological: Negative for dizziness and headaches.  Hematological: Does not bruise/bleed easily.  Psychiatric/Behavioral: Negative for agitation, behavioral problems (depression) and hallucinations.    Vital Signs: BP (!) 178/90   Pulse 71   Temp 97.8 F (36.6 C)   Resp 16   Ht 5' (1.524 m)   Wt 105 lb (47.6 kg)   SpO2 99%   BMI 20.51 kg/m    Physical Exam Vitals reviewed.  Constitutional:      Appearance: Normal appearance. She is normal weight.   Cardiovascular:     Rate and Rhythm: Normal rate and regular rhythm.     Pulses: Normal pulses.     Heart sounds: Normal heart sounds.  Pulmonary:     Effort: Pulmonary effort is normal.     Breath sounds: Normal breath sounds.  Musculoskeletal:        General: Normal range of motion.     Cervical back: Pain with movement present.  Skin:    General: Skin is warm.  Neurological:     General: No focal deficit present.     Mental Status: She is alert and oriented to person, place, and time. Mental status is at baseline.  Psychiatric:        Mood and Affect: Mood normal.        Behavior: Behavior normal.        Thought Content: Thought content normal.        Judgment: Judgment normal.    Assessment/Plan: 1. Essential hypertension BP significantly elevated  today--advised to monitor BP once daily at home and bring readings to next visit, needs to bring all of her current medications she is taking to next visit in 1 week  2. Scoliosis of lumbar spine, unspecified scoliosis type Requesting refills, will review all medications at next visit - predniSONE (DELTASONE) 5 MG tablet; TAKE 1 TABLET BY MOUTH EVERY DAY WITH BREAKFAST  Dispense: 90 tablet; Refill: 1 - gabapentin (NEURONTIN) 100 MG capsule; Take 2 capsules (200 mg total) by mouth at bedtime.  Dispense: 180 capsule; Refill: 1  3. Cervical disc disease with myelopathy Requesting refills--will review all of medications at next visit - predniSONE (DELTASONE) 5 MG tablet; TAKE 1 TABLET BY MOUTH EVERY DAY WITH BREAKFAST  Dispense: 90 tablet; Refill: 1 - gabapentin (NEURONTIN) 100 MG capsule; Take 2 capsules (200 mg total) by mouth at bedtime.  Dispense: 180 capsule; Refill: 1  General Counseling: Alexza verbalizes understanding of the findings of todays visit and agrees with plan of treatment. I have discussed any further diagnostic evaluation that may be needed or ordered today. We also reviewed her medications today. she has been  encouraged to call the office with any questions or concerns that should arise related to todays visit.   Meds ordered this encounter  Medications  . predniSONE (DELTASONE) 5 MG tablet    Sig: TAKE 1 TABLET BY MOUTH EVERY DAY WITH BREAKFAST    Dispense:  90 tablet    Refill:  1  . gabapentin (NEURONTIN) 100 MG capsule    Sig: Take 2 capsules (200 mg total) by mouth at bedtime.    Dispense:  180 capsule    Refill:  1    Time spent: 30 Minutes Time spent includes review of chart, medications, test results and follow-up plan with the patient.  This patient was seen by Theodoro Grist AGNP-C in Collaboration with Dr [name] as a part of collaborative care agreement     Tanna Furry. Harris AGNP-C Internal medicine

## 2020-08-20 ENCOUNTER — Encounter: Payer: Self-pay | Admitting: Hospice and Palliative Medicine

## 2020-08-25 ENCOUNTER — Other Ambulatory Visit: Payer: Self-pay

## 2020-08-25 ENCOUNTER — Encounter: Payer: Self-pay | Admitting: Hospice and Palliative Medicine

## 2020-08-25 ENCOUNTER — Ambulatory Visit: Payer: Medicare Other | Admitting: Hospice and Palliative Medicine

## 2020-08-25 VITALS — BP 164/90 | HR 75 | Temp 97.6°F | Resp 16 | Ht 60.0 in | Wt 105.2 lb

## 2020-08-25 DIAGNOSIS — I10 Essential (primary) hypertension: Secondary | ICD-10-CM | POA: Diagnosis not present

## 2020-08-25 DIAGNOSIS — M5000 Cervical disc disorder with myelopathy, unspecified cervical region: Secondary | ICD-10-CM | POA: Diagnosis not present

## 2020-08-25 DIAGNOSIS — F411 Generalized anxiety disorder: Secondary | ICD-10-CM

## 2020-08-25 DIAGNOSIS — G4720 Circadian rhythm sleep disorder, unspecified type: Secondary | ICD-10-CM

## 2020-08-25 NOTE — Progress Notes (Signed)
[facility] [place], [place] [address]  Internal MEDICINE  Office Visit Note  Patient Name: [name]  [id number]  [id number]  Date of Service: 08/25/2020  Chief Complaint  Patient presents with  . Follow-up    Review meds, discuss BP, neck and head pain has gotten worse in the last 5 months  . Hypertension  . Asthma    HPI Patient is here for routine follow-up Close follow-up to monitor BP and review medications  Comparing current list and cardiologist medication list there is a discrepancy is her clonidine dosing She has been taking 0.1 mg twice daily instead of 0.2 mg twice daily--dosing was adjusted at last cardiology visit due to amlodipine being discontinued due to ankle edema  Her current sleep and medication schedule may also be contributing to her elevated BP and daytime fatigue Currently she wakes up around 12 pm to take her morning medications Takes suppertime medications around 8-9 pm Typically stays up until 2-3 am and at that time will taker her bedtime medications including clonazepam  Continues to complain of neck and back pain--feels this also contributes to her BP being elevated in office as she has been up and moving around more than usual Prednisone seems to help, takes gabapentin as needed not on a regular basis but feels acetaminophen provides her the most relief  Home BP readings: 2/28 153/71 after taking AM medications at 2:40 pm 3/2 169/76 before taking any medications 9:27 am 3/28 140/64 after AM medications 3:20 pm 3/30 182/88 prior to bedtime medications at 1:05 am 3/30 154/74 after taking bedtime medications 1:30 am   Current Medication: Outpatient Encounter Medications as of 08/25/2020  Medication Sig  . Apoaequorin (PREVAGEN PO) Take by mouth daily.  . Ascorbic Acid (VITAMIN C) 1000 MG tablet Take 1,000 mg by mouth daily.  . Biotin 5000 MCG TABS Take by mouth daily.  . calcium-vitamin D (OSCAL WITH D)  500-200 MG-UNIT tablet Take 1 tablet by mouth daily with breakfast.  . cholecalciferol (VITAMIN D) 1000 units tablet Take 1,000 Units by mouth daily.  . clonazePAM (KLONOPIN) 1 MG tablet Take 1 tablet (1 mg total) by mouth at bedtime as needed for anxiety.  . cloNIDine (CATAPRES) 0.1 MG tablet Take 1 tablet (0.1 mg total) by mouth 2 (two) times daily.  Marland Kitchen ELIQUIS 2.5 MG TABS tablet TAKE 1 TABLET BY MOUTH TWICE A DAY  . gabapentin (NEURONTIN) 100 MG capsule Take 2 capsules (200 mg total) by mouth at bedtime.  . irbesartan (AVAPRO) 150 MG tablet Take 150 mg by mouth in the morning and at bedtime.  Marland Kitchen labetalol (NORMODYNE) 100 MG tablet Take 1.5 tablets (150 mg total) by mouth 2 (two) times daily.  . montelukast (SINGULAIR) 10 MG tablet Take 1 tablet (10 mg total) by mouth daily.  Vladimir Faster Glycol-Propyl Glycol (SYSTANE OP) Apply to eye daily.  . predniSONE (DELTASONE) 5 MG tablet TAKE 1 TABLET BY MOUTH EVERY DAY WITH BREAKFAST  . PROAIR HFA 108 (90 Base) MCG/ACT inhaler Inhale 2 puffs into the lungs every 4 (four) hours as needed.   . vitamin B-12 (CYANOCOBALAMIN) 1000 MCG tablet Take 1,000 mcg by mouth daily.   No facility-administered encounter medications on file as of 08/25/2020.    Surgical History: Past Surgical History:  Procedure Laterality Date  . ABDOMINAL HYSTERECTOMY    . CATARACT EXTRACTION W/PHACO Right 07/01/2019   Procedure: CATARACT EXTRACTION PHACO AND INTRAOCULAR LENS PLACEMENT (IOC) RIGHT 9.25 01:14.7 ;  Surgeon: George Ina,  Gwyndolyn Saxon, MD;  Location: [facility];  Service: Ophthalmology;  Laterality: Right;  . CATARACT EXTRACTION W/PHACO Left 09/09/2019   Procedure: CATARACT EXTRACTION PHACO AND INTRAOCULAR LENS PLACEMENT (IOC) LEFT 8.29  01:03.3;  Surgeon: Birder Robson, MD;  Location: [facility];  Service: Ophthalmology;  Laterality: Left;  . COLONOSCOPY    . RENAL ARTERY STENT    . TONSILLECTOMY      Medical History: Past Medical History:  Diagnosis  Date  . A-fib ([place])   . Asthma    in past  . Heart murmur    Pt states she has Heart Murmur  . Hypertension   . Lower leg injury 04/24/2016   08/06/17- pt reports legs still not completely healed  . Renal insufficiency    10%-Right, 90%-Left (per pt)  . Skin cancer     Family History: Family History  Problem Relation Age of Onset  . Hypertension Mother   . Cancer Sister   . Cancer Niece   . Cancer Nephew     Social History   Socioeconomic History  . Marital status: Widowed    Spouse name: Not on file  . Number of children: Not on file  . Years of education: Not on file  . Highest education level: Not on file  Occupational History  . Not on file  Tobacco Use  . Smoking status: Never Smoker  . Smokeless tobacco: Never Used  Vaping Use  . Vaping Use: Never used  Substance and Sexual Activity  . Alcohol use: No  . Drug use: No  . Sexual activity: Not on file  Other Topics Concern  . Not on file  Social History Narrative  . Not on file   Social Determinants of Health   Financial Resource Strain: Not on file  Food Insecurity: Not on file  Transportation Needs: Not on file  Physical Activity: Not on file  Stress: Not on file  Social Connections: Not on file  Intimate Partner Violence: Not on file      Review of Systems  Constitutional: Negative for chills, diaphoresis and fatigue.  HENT: Negative for ear pain, postnasal drip and sinus pressure.   Eyes: Negative for photophobia, discharge, redness, itching and visual disturbance.  Respiratory: Negative for cough, shortness of breath and wheezing.   Cardiovascular: Negative for chest pain, palpitations and leg swelling.  Gastrointestinal: Negative for abdominal pain, constipation, diarrhea, nausea and vomiting.  Genitourinary: Negative for dysuria and flank pain.  Musculoskeletal: Positive for arthralgias, back pain, neck pain and neck stiffness. Negative for gait problem.  Skin: Negative for color change.   Allergic/Immunologic: Negative for environmental allergies and food allergies.  Neurological: Negative for dizziness and headaches.  Hematological: Does not bruise/bleed easily.  Psychiatric/Behavioral: Negative for agitation, behavioral problems (depression) and hallucinations.    Vital Signs: BP (!) 164/90   Pulse 75   Temp 97.6 F (36.4 C)   Resp 16   Ht 5' (1.524 m)   Wt 105 lb 3.2 oz (47.7 kg)   SpO2 98%   BMI 20.55 kg/m    Physical Exam Vitals reviewed.  Constitutional:      Appearance: Normal appearance. She is normal weight.  Cardiovascular:     Rate and Rhythm: Normal rate and regular rhythm.     Pulses: Normal pulses.     Heart sounds: Normal heart sounds.  Pulmonary:     Effort: Pulmonary effort is normal.     Breath sounds: Normal breath sounds.  Abdominal:  General: Abdomen is flat.     Palpations: Abdomen is soft.  Musculoskeletal:        General: Normal range of motion.     Cervical back: Normal range of motion.  Skin:    General: Skin is warm.  Neurological:     General: No focal deficit present.     Mental Status: She is alert and oriented to person, place, and time. Mental status is at baseline.  Psychiatric:        Mood and Affect: Mood normal.        Behavior: Behavior normal.        Thought Content: Thought content normal.        Judgment: Judgment normal.    Assessment/Plan: 1. Hypertension, poor control Increase clonidine dosing to 0.2 mg twice daily AM and at bedtime Continue to monitor home BP readings and bring log to next visit Provided written updates on medication reminder list created by her daughter Will follow-up in 6 weeks to monitor BP response  2. Circadian rhythm sleep disturbance Discussed importance of regular sleep cycle and likely contributing to her daytime sleepiness and elevated BP Encouraged to start going to bed earlier each night, should not take bedtime medications especially clonazepam after 11  pm-12am Morning medications take around 10 am Suppertime medications take around 6-7 pm  3. Cervical disc disease with myelopathy Continue with all supportive measures, we have tried multiple different therapies which she has failed due to not helping alleviate pain or negative side effects  General Counseling: Lela verbalizes understanding of the findings of todays visit and agrees with plan of treatment. I have discussed any further diagnostic evaluation that may be needed or ordered today. We also reviewed her medications today. she has been encouraged to call the office with any questions or concerns that should arise related to todays visit.   Time spent: 30 Minutes Time spent includes review of chart, medications, test results and follow-up plan with the patient.  This patient was seen by Theodoro Grist AGNP-C in Collaboration with Dr [name] as a part of collaborative care agreement     Tanna Furry. Mattson Dayal AGNP-C Internal medicine

## 2020-08-27 ENCOUNTER — Encounter: Payer: Self-pay | Admitting: Hospice and Palliative Medicine

## 2020-10-06 ENCOUNTER — Other Ambulatory Visit: Payer: Self-pay

## 2020-10-06 ENCOUNTER — Ambulatory Visit: Payer: Medicare Other | Admitting: Nurse Practitioner

## 2020-10-06 ENCOUNTER — Encounter: Payer: Self-pay | Admitting: Nurse Practitioner

## 2020-10-06 VITALS — BP 160/75 | HR 74 | Temp 97.5°F | Resp 16 | Ht 60.0 in | Wt 102.0 lb

## 2020-10-06 DIAGNOSIS — M7989 Other specified soft tissue disorders: Secondary | ICD-10-CM | POA: Diagnosis not present

## 2020-10-06 DIAGNOSIS — M542 Cervicalgia: Secondary | ICD-10-CM | POA: Diagnosis not present

## 2020-10-06 DIAGNOSIS — I10 Essential (primary) hypertension: Secondary | ICD-10-CM | POA: Diagnosis not present

## 2020-10-06 NOTE — Progress Notes (Addendum)
[facility] [place], [place] [address]  Internal MEDICINE  Office Visit Note  Patient Name: [name]  [id number]  [id number]  Date of Service: 10/08/2020  Chief Complaint  Patient presents with  . Follow-up    Neck hurts, if pt turns head or there is a bump there is a tingling up neck to the top left side of head, cant lift head up, difficulty standing or sitting, it hurts pts butts  . Hypertension  . Asthma    HPI Anisha presents for a follow up visit regarding hypertension, asthma, and neck pain. -Kriss's blood pressure remains elevated and poorly controlled.  -Rebakah's asthma is controlled.  -Her neck has been hurting for a few weeks. She states that it hurts "really bad" and was unable to rate it for me when asked. The pain is aggravated by turning her head, if she is jarred or bumps into someone/something. Shehas difficulty standing or sitting, and cannot lift her head up. She sits or stands in a hunched position. She states she can also feel a tingling sensation going up her neck and into her head.     Current Medication: Outpatient Encounter Medications as of 10/06/2020  Medication Sig  . Apoaequorin (PREVAGEN PO) Take by mouth daily.  . Ascorbic Acid (VITAMIN C) 1000 MG tablet Take 1,000 mg by mouth daily.  . Biotin 5000 MCG TABS Take by mouth daily.  . calcium-vitamin D (OSCAL WITH D) 500-200 MG-UNIT tablet Take 1 tablet by mouth daily with breakfast.  . cholecalciferol (VITAMIN D) 1000 units tablet Take 1,000 Units by mouth daily.  . clonazePAM (KLONOPIN) 1 MG tablet Take 1 tablet (1 mg total) by mouth at bedtime as needed for anxiety.  Marland Kitchen ELIQUIS 2.5 MG TABS tablet TAKE 1 TABLET BY MOUTH TWICE A DAY  . gabapentin (NEURONTIN) 100 MG capsule Take 2 capsules (200 mg total) by mouth at bedtime.  . irbesartan (AVAPRO) 150 MG tablet Take 150 mg by mouth in the morning and at bedtime.  Marland Kitchen labetalol (NORMODYNE) 200 MG tablet Take 1 tablet  (200 mg total) by mouth 2 (two) times daily.  . montelukast (SINGULAIR) 10 MG tablet Take 1 tablet (10 mg total) by mouth daily.  Vladimir Faster Glycol-Propyl Glycol (SYSTANE OP) Apply to eye daily.  . predniSONE (DELTASONE) 5 MG tablet TAKE 1 TABLET BY MOUTH EVERY DAY WITH BREAKFAST  . PROAIR HFA 108 (90 Base) MCG/ACT inhaler Inhale 2 puffs into the lungs every 4 (four) hours as needed.   . vitamin B-12 (CYANOCOBALAMIN) 1000 MCG tablet Take 1,000 mcg by mouth daily.  . [DISCONTINUED] labetalol (NORMODYNE) 100 MG tablet Take 1.5 tablets (150 mg total) by mouth 2 (two) times daily.  . cloNIDine (CATAPRES) 0.1 MG tablet Take 1 tablet (0.1 mg total) by mouth 2 (two) times daily. (Patient not taking: Reported on 10/06/2020)   No facility-administered encounter medications on file as of 10/06/2020.    Surgical History: Past Surgical History:  Procedure Laterality Date  . ABDOMINAL HYSTERECTOMY    . CATARACT EXTRACTION W/PHACO Right 07/01/2019   Procedure: CATARACT EXTRACTION PHACO AND INTRAOCULAR LENS PLACEMENT (IOC) RIGHT 9.25 01:14.7 ;  Surgeon: Birder Robson, MD;  Location: [facility];  Service: Ophthalmology;  Laterality: Right;  . CATARACT EXTRACTION W/PHACO Left 09/09/2019   Procedure: CATARACT EXTRACTION PHACO AND INTRAOCULAR LENS PLACEMENT (IOC) LEFT 8.29  01:03.3;  Surgeon: Birder Robson, MD;  Location: [facility];  Service: Ophthalmology;  Laterality: Left;  . COLONOSCOPY    .  RENAL ARTERY STENT    . TONSILLECTOMY      Medical History: Past Medical History:  Diagnosis Date  . A-fib ([place])   . Asthma    in past  . Heart murmur    Pt states she has Heart Murmur  . Hypertension   . Lower leg injury 04/24/2016   08/06/17- pt reports legs still not completely healed  . Renal insufficiency    10%-Right, 90%-Left (per pt)  . Skin cancer     Family History: Family History  Problem Relation Age of Onset  . Hypertension Mother   . Cancer Sister   . Cancer  Niece   . Cancer Nephew     Social History   Socioeconomic History  . Marital status: Widowed    Spouse name: Not on file  . Number of children: Not on file  . Years of education: Not on file  . Highest education level: Not on file  Occupational History  . Not on file  Tobacco Use  . Smoking status: Never Smoker  . Smokeless tobacco: Never Used  Vaping Use  . Vaping Use: Never used  Substance and Sexual Activity  . Alcohol use: No  . Drug use: No  . Sexual activity: Not on file  Other Topics Concern  . Not on file  Social History Narrative  . Not on file   Social Determinants of Health   Financial Resource Strain: Not on file  Food Insecurity: Not on file  Transportation Needs: Not on file  Physical Activity: Not on file  Stress: Not on file  Social Connections: Not on file  Intimate Partner Violence: Not on file      Review of Systems  Constitutional: Negative.   HENT: Negative.   Respiratory: Negative.   Cardiovascular: Negative.   Gastrointestinal: Negative.   Genitourinary: Negative.     Vital Signs: BP (!) 180/77   Pulse 74   Temp (!) 97.5 F (36.4 C)   Resp 16   Ht 5' (1.524 m)   Wt 102 lb (46.3 kg)   SpO2 98%   BMI 19.92 kg/m    Physical Exam Constitutional:      Appearance: Normal appearance.  HENT:     Head: Normocephalic and atraumatic.  Cardiovascular:     Rate and Rhythm: Normal rate and regular rhythm.     Pulses: Normal pulses.     Heart sounds: Normal heart sounds.  Pulmonary:     Effort: Pulmonary effort is normal.     Breath sounds: Normal breath sounds.  Musculoskeletal:     Cervical back: Tenderness present. Pain with movement and muscular tenderness present. Decreased range of motion.  Skin:    General: Skin is warm and dry.  Neurological:     Mental Status: She is alert and oriented to person, place, and time.  Psychiatric:        Mood and Affect: Mood normal.        Behavior: Behavior normal.      Assessment/Plan: 1. Cervicalgia Donda has been have severe neck pain that will likely need x-rays and physical therapy, etc. Referred to orthopedic specialist to evaluate and treat neck pain.  - Ambulatory referral to Orthopedic Surgery  2. Hypertension, poor control Blood pressure not well-controlled. 180/77 today, and 160/85 when rechecked manually in office. Labetalol increased to 200 mg twice daily. She has a scheduled appointment on 01/18/2021 with her cardiologist Dr. Rockey Situ. Will reevaluate blood pressure at next appointment on 01/20/2021.  -  labetalol (NORMODYNE) 200 MG tablet; Take 1 tablet (200 mg total) by mouth 2 (two) times daily.  Dispense: 180 tablet; Refill: 0  Hypertension Counseling:   The following hypertensive lifestyle modification were recommended and discussed:  1. Limiting alcohol intake to less than 1 oz/day of ethanol:(24 oz of beer or 8 oz of wine or 2 oz of 100-proof whiskey). 2. Take baby ASA 81 mg daily. 3. Importance of regular aerobic exercise and losing weight. 4. Reduce dietary saturated fat and cholesterol intake for overall cardiovascular health. 5. Maintaining adequate dietary potassium, calcium, and magnesium intake. 6. Regular monitoring of the blood pressure. 7. Reduce sodium intake to less than 100 mmol/day (less than 2.3 gm of sodium or less than 6 gm of sodium choride)   times daily.  Dispense: 180 tablet; Refill: 0  3.leg swelling ([place])  Likely due to norvasc   General Counseling: [name] verbalizes understanding of the findings of todays visit and agrees with plan of treatment. I have discussed any further diagnostic evaluation that may be needed or ordered today. We also reviewed her medications today. she has been encouraged to call the office with any questions or concerns that should arise related to todays visit.    Orders Placed This Encounter  Procedures  . Ambulatory referral to Orthopedic Surgery    Meds ordered this  encounter  Medications  . labetalol (NORMODYNE) 200 MG tablet    Sig: Take 1 tablet (200 mg total) by mouth 2 (two) times daily.    Dispense:  180 tablet    Refill:  0    Total time spent: 20 Minutes Time spent includes review of chart, medications, test results, and follow up plan with the patient.   [place] Controlled Substance Database was reviewed by me.   Dr [name] Internal medicine

## 2020-10-07 ENCOUNTER — Other Ambulatory Visit: Payer: Self-pay | Admitting: Nurse Practitioner

## 2020-10-07 DIAGNOSIS — I10 Essential (primary) hypertension: Secondary | ICD-10-CM

## 2020-10-08 MED ORDER — LABETALOL HCL 200 MG PO TABS: 200.0000 mg | ORAL_TABLET | Freq: Two times a day (BID) | ORAL | 0 refills | Status: DC

## 2020-10-11 ENCOUNTER — Telehealth: Payer: Self-pay

## 2020-10-11 NOTE — Telephone Encounter (Signed)
Spoke to pt and her daughter, explained the dose of the labetalol has changed to 200 mg by mouth twice daily.

## 2020-10-11 NOTE — Telephone Encounter (Signed)
-----   Message from Jonetta Osgood, NP sent at 10/08/2020  3:45 PM EDT ----- Regarding: medication increase Hi Alex,  I increased the patient's dose of labetalol to 200 mg by mouth twice daily. I have already sent the order to the pharmacy, can you call the patient this afternoon or Monday to let them know? I know it may be too late today so Monday is fine.  Thank you, Alyssa

## 2020-10-23 ENCOUNTER — Other Ambulatory Visit: Payer: Self-pay | Admitting: Nurse Practitioner

## 2020-10-23 DIAGNOSIS — I10 Essential (primary) hypertension: Secondary | ICD-10-CM

## 2020-11-10 ENCOUNTER — Ambulatory Visit: Payer: Medicare Other | Admitting: Nurse Practitioner

## 2020-11-12 ENCOUNTER — Other Ambulatory Visit: Payer: Self-pay | Admitting: Internal Medicine

## 2020-11-12 ENCOUNTER — Other Ambulatory Visit: Payer: Self-pay

## 2020-11-12 ENCOUNTER — Other Ambulatory Visit: Payer: Self-pay | Admitting: Nurse Practitioner

## 2020-11-12 MED ORDER — IRBESARTAN 150 MG PO TABS: 150.0000 mg | ORAL_TABLET | Freq: Two times a day (BID) | ORAL | 1 refills | Status: DC

## 2020-11-12 NOTE — Telephone Encounter (Signed)
Can you please

## 2020-11-23 ENCOUNTER — Other Ambulatory Visit: Payer: Self-pay

## 2020-11-23 ENCOUNTER — Encounter: Payer: Self-pay | Admitting: Internal Medicine

## 2020-11-23 ENCOUNTER — Ambulatory Visit: Payer: Medicare Other | Admitting: Internal Medicine

## 2020-11-23 VITALS — BP 150/88 | HR 71 | Temp 97.8°F | Resp 16 | Ht 60.0 in | Wt 102.0 lb

## 2020-11-23 DIAGNOSIS — M542 Cervicalgia: Secondary | ICD-10-CM

## 2020-11-23 DIAGNOSIS — I10 Essential (primary) hypertension: Secondary | ICD-10-CM

## 2020-11-23 MED ORDER — TRAMADOL HCL 50 MG PO TABS: 50.0000 mg | ORAL_TABLET | Freq: Four times a day (QID) | ORAL | 0 refills | Status: AC | PRN

## 2020-11-23 MED ORDER — IRBESARTAN 300 MG PO TABS: ORAL_TABLET | ORAL | 2 refills | Status: DC

## 2020-11-23 MED ORDER — HYDRALAZINE HCL 10 MG PO TABS: 10.0000 mg | ORAL_TABLET | Freq: Three times a day (TID) | ORAL | 3 refills | Status: DC

## 2020-11-23 NOTE — Progress Notes (Signed)
[facility] [place], [place] [address]  Internal MEDICINE  Office Visit Note  Patient Name: [name]  [id number]  [id number]  Date of Service: 11/30/2020  Chief Complaint  Patient presents with   Follow-up   Hypertension   Asthma    HPI  Pt is here for blood pressure follow up. Her Labetalol is changed to 200 mg bid and Avapro 150 mg po bid as well  Her main concern is neck pain and headache, she continues to have neck pain and is scheduled to see ortho  Pt looks uncomfortable with pain     Current Medication: Outpatient Encounter Medications as of 11/23/2020  Medication Sig   Ascorbic Acid (VITAMIN C) 1000 MG tablet Take 1,000 mg by mouth daily.   calcium-vitamin D (OSCAL WITH D) 500-200 MG-UNIT tablet Take 1 tablet by mouth daily with breakfast.   cholecalciferol (VITAMIN D) 1000 units tablet Take 1,000 Units by mouth daily.   ELIQUIS 2.5 MG TABS tablet TAKE 1 TABLET BY MOUTH TWICE A DAY   gabapentin (NEURONTIN) 100 MG capsule Take 2 capsules (200 mg total) by mouth at bedtime.   hydrALAZINE (APRESOLINE) 10 MG tablet Take 1 tablet (10 mg total) by mouth 3 (three) times daily.   montelukast (SINGULAIR) 10 MG tablet Take 1 tablet (10 mg total) by mouth daily.   Polyethyl Glycol-Propyl Glycol (SYSTANE OP) Apply to eye daily.   predniSONE (DELTASONE) 5 MG tablet TAKE 1 TABLET BY MOUTH EVERY DAY WITH BREAKFAST   PROAIR HFA 108 (90 Base) MCG/ACT inhaler Inhale 2 puffs into the lungs every 4 (four) hours as needed.    [EXPIRED] traMADol (ULTRAM) 50 MG tablet Take 1 tablet (50 mg total) by mouth every 6 (six) hours as needed for up to 5 days. For neck pain   vitamin B-12 (CYANOCOBALAMIN) 1000 MCG tablet Take 1,000 mcg by mouth daily.   [DISCONTINUED] clonazePAM (KLONOPIN) 1 MG tablet Take 1 tablet (1 mg total) by mouth at bedtime as needed for anxiety.   [DISCONTINUED] irbesartan (AVAPRO) 150 MG tablet Take 1 tablet (150 mg total) by mouth 2 (two)  times daily.   [DISCONTINUED] labetalol (NORMODYNE) 200 MG tablet Take 1 tablet (200 mg total) by mouth 2 (two) times daily.   irbesartan (AVAPRO) 300 MG tablet Take Half tab po in am and half tab in pm   [DISCONTINUED] Apoaequorin (PREVAGEN PO) Take by mouth daily.   [DISCONTINUED] Biotin 5000 MCG TABS Take by mouth daily. (Patient not taking: Reported on 11/23/2020)   [DISCONTINUED] cloNIDine (CATAPRES) 0.1 MG tablet Take 1 tablet (0.1 mg total) by mouth 2 (two) times daily. (Patient not taking: Reported on 11/23/2020)   No facility-administered encounter medications on file as of 11/23/2020.    Surgical History: Past Surgical History:  Procedure Laterality Date   ABDOMINAL HYSTERECTOMY     CATARACT EXTRACTION W/PHACO Right 07/01/2019   Procedure: CATARACT EXTRACTION PHACO AND INTRAOCULAR LENS PLACEMENT (IOC) RIGHT 9.25 01:14.7 ;  Surgeon: Birder Robson, MD;  Location: [facility];  Service: Ophthalmology;  Laterality: Right;   CATARACT EXTRACTION W/PHACO Left 09/09/2019   Procedure: CATARACT EXTRACTION PHACO AND INTRAOCULAR LENS PLACEMENT (IOC) LEFT 8.29  01:03.3;  Surgeon: Birder Robson, MD;  Location: [facility];  Service: Ophthalmology;  Laterality: Left;   COLONOSCOPY     RENAL ARTERY STENT     TONSILLECTOMY      Medical History: Past Medical History:  Diagnosis Date   A-fib ([place])    Asthma  in past   Heart murmur    Pt states she has Heart Murmur   Hypertension    Lower leg injury 04/24/2016   08/06/17- pt reports legs still not completely healed   Renal insufficiency    10%-Right, 90%-Left (per pt)   Skin cancer     Family History: Family History  Problem Relation Age of Onset   Hypertension Mother    Cancer Sister    Cancer Niece    Cancer Nephew     Social History   Socioeconomic History   Marital status: Widowed    Spouse name: Not on file   Number of children: Not on file   Years of education: Not on file   Highest education  level: Not on file  Occupational History   Not on file  Tobacco Use   Smoking status: Never   Smokeless tobacco: Never  Vaping Use   Vaping Use: Never used  Substance and Sexual Activity   Alcohol use: No   Drug use: No   Sexual activity: Not on file  Other Topics Concern   Not on file  Social History Narrative   Not on file   Social Determinants of Health   Financial Resource Strain: Not on file  Food Insecurity: Not on file  Transportation Needs: Not on file  Physical Activity: Not on file  Stress: Not on file  Social Connections: Not on file  Intimate Partner Violence: Not on file      Review of Systems  Constitutional:  Negative for chills, fatigue and unexpected weight change.  HENT:  Negative for congestion, postnasal drip, rhinorrhea, sneezing and sore throat.   Eyes:  Negative for redness.  Respiratory:  Negative for cough, chest tightness and shortness of breath.   Cardiovascular:  Negative for chest pain and palpitations.  Gastrointestinal:  Negative for abdominal pain, constipation, diarrhea, nausea and vomiting.  Genitourinary:  Negative for dysuria and frequency.  Musculoskeletal:  Negative for arthralgias, back pain, joint swelling and neck pain.  Skin:  Negative for rash.  Neurological: Negative.  Negative for tremors and numbness.  Hematological:  Negative for adenopathy. Does not bruise/bleed easily.  Psychiatric/Behavioral:  Negative for behavioral problems (Depression), sleep disturbance and suicidal ideas. The patient is not nervous/anxious.    Vital Signs: BP (!) 150/88 Comment: 210/90  Pulse 71   Temp 97.8 F (36.6 C)   Resp 16   Ht 5' (1.524 m)   Wt 102 lb (46.3 kg)   SpO2 98%   BMI 19.92 kg/m    Physical Exam Constitutional:      Appearance: Normal appearance.  HENT:     Head: Normocephalic and atraumatic.     Nose: Nose normal.     Mouth/Throat:     Mouth: Mucous membranes are moist.     Pharynx: No posterior oropharyngeal  erythema.  Eyes:     Extraocular Movements: Extraocular movements intact.     Pupils: Pupils are equal, round, and reactive to light.  Cardiovascular:     Pulses: Normal pulses.     Heart sounds: Normal heart sounds.  Pulmonary:     Effort: Pulmonary effort is normal.     Breath sounds: Normal breath sounds.  Neurological:     General: No focal deficit present.     Mental Status: She is alert.  Psychiatric:        Mood and Affect: Mood normal.        Behavior: Behavior normal.  Assessment/Plan: 1. Uncontrolled hypertension Continue Labetalol 200 mg bid as before, pt is encouraged not to skip any dose   Continue irbesartan (AVAPRO) 300 MG tablet; Take Half tab po in am and half tab in pm  Dispense: 90 tablet; Refill: 2 Will add hydrALAZINE (APRESOLINE) 10 MG tablet; Take 1 tablet (10 mg total) by mouth 3 (three) times daily.  Dispense: 90 tablet; Refill: 3 She is unable to tolerate norvasc   2. Neck pain Pt is scheduled tos ee ortho  - traMADol (ULTRAM) 50 MG tablet; Take 1 tablet (50 mg total) by mouth every 6 (six) hours as needed for up to 5 days. For neck pain  Dispense: 15 tablet; Refill: 0   General Counseling: Brantley verbalizes understanding of the findings of todays visit and agrees with plan of treatment. I have discussed any further diagnostic evaluation that may be needed or ordered today. We also reviewed her medications today. she has been encouraged to call the office with any questions or concerns that should arise related to todays visit.    No orders of the defined types were placed in this encounter.   Meds ordered this encounter  Medications   irbesartan (AVAPRO) 300 MG tablet    Sig: Take Half tab po in am and half tab in pm    Dispense:  90 tablet    Refill:  2   traMADol (ULTRAM) 50 MG tablet    Sig: Take 1 tablet (50 mg total) by mouth every 6 (six) hours as needed for up to 5 days. For neck pain    Dispense:  15 tablet    Refill:  0    hydrALAZINE (APRESOLINE) 10 MG tablet    Sig: Take 1 tablet (10 mg total) by mouth 3 (three) times daily.    Dispense:  90 tablet    Refill:  3     Total time spent:30 Minutes Time spent includes review of chart, medications, test results, and follow up plan with the patient.   [place] Controlled Substance Database was reviewed by me.   Dr [name] Internal medicine

## 2020-11-25 ENCOUNTER — Other Ambulatory Visit: Payer: Self-pay | Admitting: Internal Medicine

## 2020-11-25 ENCOUNTER — Telehealth: Payer: Self-pay

## 2020-11-25 DIAGNOSIS — F411 Generalized anxiety disorder: Secondary | ICD-10-CM

## 2020-11-25 DIAGNOSIS — I10 Essential (primary) hypertension: Secondary | ICD-10-CM

## 2020-11-25 MED ORDER — CLONAZEPAM 1 MG PO TABS
ORAL_TABLET | ORAL | 2 refills | Status: DC
Start: 2020-11-25 — End: 2021-12-08

## 2020-11-25 MED ORDER — LABETALOL HCL 200 MG PO TABS: 200.0000 mg | ORAL_TABLET | Freq: Two times a day (BID) | ORAL | 0 refills | Status: DC

## 2020-11-25 NOTE — Telephone Encounter (Signed)
Okay 

## 2020-11-30 ENCOUNTER — Other Ambulatory Visit: Payer: Self-pay

## 2020-11-30 DIAGNOSIS — I10 Essential (primary) hypertension: Secondary | ICD-10-CM

## 2020-12-22 ENCOUNTER — Telehealth: Payer: Self-pay | Admitting: Cardiovascular Disease

## 2020-12-22 NOTE — Telephone Encounter (Signed)
   [facility] Pre-operative Risk Assessment    Patient Name: [name]  DOB: 1928/10/23 MRN: [id number]  HEARTCARE STAFF:  - IMPORTANT!!!!!! Under Visit Info/Reason for Call, type in Other and utilize the format Clearance MM/DD/YY or Clearance TBD. Do not use dashes or single digits. - Please review there is not already an duplicate clearance open for this procedure. - If request is for dental extraction, please clarify the # of teeth to be extracted. - If the patient is currently at the dentist's office, call Pre-Op Callback Staff (MA/nurse) to input urgent request.  - If the patient is not currently in the dentist office, please route to the Pre-Op pool.  Request for surgical clearance:  What type of surgery is being performed? Dental extractions (LVM w. [place] to clarify how many)  When is this surgery scheduled? TBD  What type of clearance is required (medical clearance vs. Pharmacy clearance to hold med vs. Both)? pharmacy  Are there any medications that need to be held prior to surgery and how long? eliquis  Practice name and name of physician performing surgery? [facility] Dr. Gillian Shields  What is the office phone number? [phone]   7.   What is the office fax number? [phone]  8.   Anesthesia type (None, local, MAC, general) ? Not noted   Marykay Lex 12/22/2020, 4:39 PM  _________________________________________________________________   (provider comments below)

## 2020-12-23 NOTE — Telephone Encounter (Signed)
3 teeth will be extracted

## 2020-12-24 NOTE — Telephone Encounter (Signed)
   Primary Cardiologist: Ida Rogue, MD  Chart reviewed as part of pre-operative protocol coverage. Given past medical history and time since last visit, based on ACC/AHA guidelines, [name] would be at acceptable risk for the planned procedure without further cardiovascular testing.   Patient with diagnosis of atrial fibrillation on Eliquis for anticoagulation.     Procedure: 3 dental extraction Date of procedure: TBD     CHA2DS2-VASc Score = 4  This indicates a 4.8% annual risk of stroke. The patient's score is based upon: CHF History: No HTN History: Yes Diabetes History: No Stroke History: No Vascular Disease History: No Age Score: 2 Gender Score: 1    CrCl 27 Platelet count 244   Patient does not require pre-op antibiotics for dental procedure.   Per office protocol, patient can hold Eliquis for 2 days prior to procedure.   Patient will not need bridging with Lovenox (enoxaparin) around procedure.   Patient should restart Eliquis on the evening of procedure or day after, at discretion of procedure MD  I will route this recommendation to the requesting party via [facility] fax function and remove from pre-op pool.  Please call with questions.  Ashley Ng. Icis Budreau NP-C    12/24/2020, 2:28 PM [facility] [place] [address] Office [phone] Fax [phone]

## 2020-12-24 NOTE — Telephone Encounter (Signed)
Patient with diagnosis of atrial fibrillation on Eliquis for anticoagulation.    Procedure: 3 dental extractionx Date of procedure: TBD   CHA2DS2-VASc Score = 4  This indicates a 4.8% annual risk of stroke. The patient's score is based upon: CHF History: No HTN History: Yes Diabetes History: No Stroke History: No Vascular Disease History: No Age Score: 2 Gender Score: 1   CrCl 27 Platelet count 244  Patient does not require pre-op antibiotics for dental procedure.  Per office protocol, patient can hold Eliquis for 2 days prior to procedure.   Patient will not need bridging with Lovenox (enoxaparin) around procedure.  Patient should restart Eliquis on the evening of procedure or day after, at discretion of procedure MD

## 2021-01-07 ENCOUNTER — Other Ambulatory Visit: Payer: Self-pay

## 2021-01-07 ENCOUNTER — Emergency Department: Payer: Medicare Other

## 2021-01-07 ENCOUNTER — Emergency Department
Admission: EM | Admit: 2021-01-07 | Discharge: 2021-01-07 | Disposition: A | Discharge status: Home / Self Care | Payer: Medicare Other | Attending: Emergency Medicine | Admitting: Emergency Medicine

## 2021-01-07 DIAGNOSIS — S0083XA Contusion of other part of head, initial encounter: Secondary | ICD-10-CM | POA: Diagnosis not present

## 2021-01-07 DIAGNOSIS — S0990XA Unspecified injury of head, initial encounter: Secondary | ICD-10-CM | POA: Diagnosis present

## 2021-01-07 DIAGNOSIS — Z79899 Other long term (current) drug therapy: Secondary | ICD-10-CM | POA: Insufficient documentation

## 2021-01-07 DIAGNOSIS — Y92000 Kitchen of unspecified non-institutional (private) residence as  the place of occurrence of the external cause: Secondary | ICD-10-CM | POA: Diagnosis not present

## 2021-01-07 DIAGNOSIS — Z7901 Long term (current) use of anticoagulants: Secondary | ICD-10-CM | POA: Insufficient documentation

## 2021-01-07 DIAGNOSIS — Y9389 Activity, other specified: Secondary | ICD-10-CM | POA: Insufficient documentation

## 2021-01-07 DIAGNOSIS — J45909 Unspecified asthma, uncomplicated: Secondary | ICD-10-CM | POA: Insufficient documentation

## 2021-01-07 DIAGNOSIS — I10 Essential (primary) hypertension: Secondary | ICD-10-CM | POA: Diagnosis not present

## 2021-01-07 DIAGNOSIS — W19XXXA Unspecified fall, initial encounter: Secondary | ICD-10-CM

## 2021-01-07 DIAGNOSIS — I4891 Unspecified atrial fibrillation: Secondary | ICD-10-CM | POA: Insufficient documentation

## 2021-01-07 DIAGNOSIS — M5481 Occipital neuralgia: Secondary | ICD-10-CM | POA: Diagnosis not present

## 2021-01-07 DIAGNOSIS — Z85828 Personal history of other malignant neoplasm of skin: Secondary | ICD-10-CM | POA: Diagnosis not present

## 2021-01-07 DIAGNOSIS — E875 Hyperkalemia: Secondary | ICD-10-CM | POA: Insufficient documentation

## 2021-01-07 DIAGNOSIS — W108XXA Fall (on) (from) other stairs and steps, initial encounter: Secondary | ICD-10-CM | POA: Insufficient documentation

## 2021-01-07 LAB — CBC WITH DIFFERENTIAL/PLATELET
Abs Immature Granulocytes: 0.03 10*3/uL (ref 0.00–0.07)
Basophils Absolute: 0.1 10*3/uL (ref 0.0–0.1)
Basophils Relative: 1 %
Eosinophils Absolute: 0 10*3/uL (ref 0.0–0.5)
Eosinophils Relative: 0 %
HCT: 33.8 % — ABNORMAL LOW (ref 36.0–46.0)
Hemoglobin: 11 g/dL — ABNORMAL LOW (ref 12.0–15.0)
Immature Granulocytes: 1 %
Lymphocytes Relative: 16 %
Lymphs Abs: 1 10*3/uL (ref 0.7–4.0)
MCH: 33.7 pg (ref 26.0–34.0)
MCHC: 32.5 g/dL (ref 30.0–36.0)
MCV: 103.7 fL — ABNORMAL HIGH (ref 80.0–100.0)
Monocytes Absolute: 0.4 10*3/uL (ref 0.1–1.0)
Monocytes Relative: 6 %
Neutro Abs: 4.9 10*3/uL (ref 1.7–7.7)
Neutrophils Relative %: 76 %
Platelets: 226 10*3/uL (ref 150–400)
RBC: 3.26 MIL/uL — ABNORMAL LOW (ref 3.87–5.11)
RDW: 13.2 % (ref 11.5–15.5)
WBC: 6.5 10*3/uL (ref 4.0–10.5)
nRBC: 0 % (ref 0.0–0.2)

## 2021-01-07 LAB — COMPREHENSIVE METABOLIC PANEL
ALT: 13 U/L (ref 0–44)
AST: 29 U/L (ref 15–41)
Albumin: 4.1 g/dL (ref 3.5–5.0)
Alkaline Phosphatase: 52 U/L (ref 38–126)
Anion gap: 11 (ref 5–15)
BUN: 20 mg/dL (ref 8–23)
CO2: 21 mmol/L — ABNORMAL LOW (ref 22–32)
Calcium: 9.2 mg/dL (ref 8.9–10.3)
Chloride: 107 mmol/L (ref 98–111)
Creatinine, Ser: 0.92 mg/dL (ref 0.44–1.00)
GFR, Estimated: 60 mL/min — ABNORMAL LOW (ref >60)
Glucose, Bld: 108 mg/dL — ABNORMAL HIGH (ref 70–99)
Potassium: 5.4 mmol/L — ABNORMAL HIGH (ref 3.5–5.1)
Sodium: 139 mmol/L (ref 135–145)
Total Bilirubin: 1.1 mg/dL (ref 0.3–1.2)
Total Protein: 6.6 g/dL (ref 6.5–8.1)

## 2021-01-07 LAB — PROTIME-INR
INR: 1.1 (ref 0.8–1.2)
Prothrombin Time: 14.1 seconds (ref 11.4–15.2)

## 2021-01-07 NOTE — ED Triage Notes (Signed)
First nurse and charge RN aware of patient and acuity.

## 2021-01-07 NOTE — ED Notes (Signed)
Patient transported to CT 

## 2021-01-07 NOTE — ED Provider Notes (Signed)
[facility] Emergency Department Provider Note  Time seen: 10:06 PM  I have reviewed the triage vital signs and the nursing notes.   HISTORY  Chief Complaint Fall   HPI [name] is a 87 y.o. female with a past medical history of atrial fibrillation, hypertension, presents to the emergency department after a fall and a head injury.  According to the patient she was standing up watching TV when she was feeling very tired.  Patient states she missed stepped and fell hitting her head on the kitchen floor.  Patient states hit the right forehead where she has a moderate size hematoma to this area.  Denies any neck pain.  Denies any other discomfort.  No LOC.  No vomiting.   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 05/01/2018   Bladder prolapse, female, acquired 02/04/2018   Increased urinary frequency 01/16/2018   Right upper quadrant abdominal tenderness 01/16/2018   Incomplete bladder emptying 12/04/2017   Recurrent cellulitis of lower leg 12/04/2017   Hypertension, poor control 10/28/2017   SCC (squamous cell carcinoma), leg, left 08/02/2016   Aortic valve insufficiency 05/02/2016   Diverticulosis 05/02/2016   GAD (generalized anxiety disorder) 05/02/2016   History of gastritis 05/02/2016   IBS (irritable bowel syndrome) 05/02/2016   Peripheral vascular disease ([place]) 05/02/2016   Trauma    Syncope 04/21/2016   SCC  (squamous cell carcinoma), hand, right 03/08/2016   Heart murmur    Local infection of skin and subcutaneous tissue 05/15/2012    Past Surgical History:  Procedure Laterality Date   ABDOMINAL HYSTERECTOMY     CATARACT EXTRACTION W/PHACO Right 07/01/2019   Procedure: CATARACT EXTRACTION PHACO AND INTRAOCULAR LENS PLACEMENT (IOC) RIGHT 9.25 01:14.7 ;  Surgeon: Birder Robson, MD;  Location: [place];  Service: Ophthalmology;  Laterality: Right;   CATARACT EXTRACTION W/PHACO Left 09/09/2019   Procedure: CATARACT EXTRACTION PHACO AND INTRAOCULAR LENS PLACEMENT (IOC) LEFT 8.29  01:03.3;  Surgeon: Birder Robson, MD;  Location: [place];  Service: Ophthalmology;  Laterality: Left;   COLONOSCOPY     RENAL ARTERY STENT     TONSILLECTOMY      Prior to Admission medications   Medication Sig Start Date End Date Taking? Authorizing Provider  Ascorbic Acid (VITAMIN C) 1000 MG tablet Take 1,000 mg by mouth daily.    [provider]  calcium-vitamin D (OSCAL WITH D) 500-200 MG-UNIT tablet Take 1 tablet by mouth daily with breakfast.    [provider]  cholecalciferol (VITAMIN D) 1000 units tablet Take 1,000 Units by mouth daily.    [provider]  clonazePAM Bobbye Charleston) 1 MG tablet Take half to one tab po qhs prn for insomnia 11/25/20   [name], MD  ELIQUIS 2.5 MG TABS tablet TAKE 1 TABLET BY MOUTH TWICE A DAY 08/11/20   Gollan,  Kathlene November, MD  gabapentin (NEURONTIN) 100 MG capsule Take 2 capsules (200 mg total) by mouth at bedtime. 08/19/20   Luiz Ochoa, NP  hydrALAZINE (APRESOLINE) 10 MG tablet Take 1 tablet (10 mg total) by mouth 3 (three) times daily. 11/23/20   [name], MD  irbesartan Levy Sjogren) 300 MG tablet Take Half tab po in am and half tab in pm 11/23/20   [name], MD  labetalol (NORMODYNE) 200 MG tablet Take 1 tablet (200 mg total) by mouth 2 (two) times daily. 11/25/20   [name], MD  montelukast (SINGULAIR) 10 MG  tablet Take 1 tablet (10 mg total) by mouth daily. 04/30/20   Luiz Ochoa, NP  Polyethyl Glycol-Propyl Glycol (SYSTANE OP) Apply to eye daily.    [provider]  predniSONE (DELTASONE) 5 MG tablet TAKE 1 TABLET BY MOUTH EVERY DAY WITH BREAKFAST 08/19/20   Luiz Ochoa, NP  PROAIR HFA 108 [phone] Base) MCG/ACT inhaler Inhale 2 puffs into the lungs every 4 (four) hours as needed.  01/27/16   [provider]  vitamin B-12 (CYANOCOBALAMIN) 1000 MCG tablet Take 1,000 mcg by mouth daily.    [provider]    Allergies  Allergen Reactions   Bacitracin     Unknown    Neomycin     unknown   Penicillamine Other (See Comments)   Penicillins Diarrhea    Family History  Problem Relation Age of Onset   Hypertension Mother    Cancer Sister    Cancer Niece    Cancer Nephew     Social History Social History   Tobacco Use   Smoking status: Never   Smokeless tobacco: Never  Vaping Use   Vaping Use: Never used  Substance Use Topics   Alcohol use: No   Drug use: No    Review of Systems Constitutional: Negative for fever.  Positive for head injury. Cardiovascular: Negative for chest pain. Respiratory: Negative for shortness of breath. Gastrointestinal: Negative for abdominal pain, vomiting  Musculoskeletal: Negative for musculoskeletal complaints Skin: Hematoma to right forehead. Neurological: 2 months of intermittent sharp shooting pains to the left scalp. All other ROS negative  ____________________________________________   PHYSICAL EXAM:  VITAL SIGNS: ED Triage Vitals  Enc Vitals Group     BP 01/07/21 1955 (!) 201/82     Pulse Rate 01/07/21 1954 89     Resp 01/07/21 1954 16     Temp 01/07/21 1954 97.9 F (36.6 C)     Temp Source 01/07/21 1954 Oral     SpO2 01/07/21 1954 100 %     Weight 01/07/21 1955 101 lb 6.6 oz (46 kg)     Height 01/07/21 1955 5' (1.524 m)     Head Circumference --      Peak Flow --      Pain Score 01/07/21 1954 0      Pain Loc --      Pain Edu? --      Excl. in [place]? --    Constitutional: Alert and oriented. Well appearing and in no distress. Eyes: Normal exam ENT      Head: Small to moderate right forehead hematoma.  No laceration or abrasion.      Mouth/Throat: Mucous membranes are moist. Cardiovascular: Normal rate, regular rhythm.  Respiratory: Normal respiratory effort without tachypnea nor retractions. Breath sounds are clear  Gastrointestinal: Soft and nontender. No distention.   Musculoskeletal: Nontender with normal range of motion in all extremities.  Neurologic:  Normal speech and language. No gross focal neurologic deficits  Skin:  Skin is warm, dry and hematoma to right forehead. Psychiatric: Mood and affect are normal.   ____________________________________________    EKG  EKG viewed and interpreted by myself shows a normal sinus rhythm at 71 bpm with a narrow QRS, normal axis, normal intervals, no concerning ST changes.  ____________________________________________    RADIOLOGY  CT scan of the head and C-spine are negative for acute abnormality.  ____________________________________________   INITIAL IMPRESSION / ASSESSMENT AND PLAN / ED COURSE  Pertinent labs & imaging results that were available during my care of the patient were reviewed by me and considered in my medical decision making (see chart for details).   Patient presents emergency department after a fall today hitting her right forehead.  Patient does have a hematoma to the right forehead however CT scan of the head and neck are negative for acute abnormality.  Patient's lab work does show mild hyperkalemia.  No concerning EKG findings.  Patient does not appear to be on any potassium supplements, daughter denies any potassium supplements.  I discussed with the patient to increase her water intake over the next several days and see her doctor Tuesday or Wednesday for recheck of her labs including potassium level.  On  an unrelated note patient has a history of sharp severe pain to her left occipital scalp that feels like "electrical shocks."  Patient had several of these episodes during my evaluation.  Daughter states this has been an ongoing issue times months.  We will refer to neurology for further evaluation.  Symptoms are very consistent with occipital neuralgia we will attempt a trigger point injection at the base of the occipital nerve with bupivacaine to see if this helps the patient's symptoms.  Patient agreeable to plan of care.  Patient will be discharged with neurology follow-up and PCP follow-up next week.  Karlin Mauss was evaluated in Emergency Department on 01/07/2021 for the symptoms described in the history of present illness. She was evaluated in the context of the global COVID-19 pandemic, which necessitated consideration that the patient might be at risk for infection with the SARS-CoV-2 virus that causes COVID-19. Institutional protocols and algorithms that pertain to the evaluation of patients at risk for COVID-19 are in a state of rapid change based on information released by regulatory bodies including the CDC and federal and state organizations. These policies and algorithms were followed during the patient's care in the ED.  ____________________________________________   FINAL CLINICAL IMPRESSION(S) / ED DIAGNOSES  Fall Head injury Hyperkalemia Occipital neuralgia   Harvest Dark, MD 01/07/21 2228

## 2021-01-07 NOTE — Discharge Instructions (Addendum)
Please call the number provided for neurology to arrange a follow-up appointment as soon as possible.  Return to the emergency department for any symptoms personally concerning to yourself.

## 2021-01-07 NOTE — ED Triage Notes (Addendum)
Patient reports fall today, hitting head. Patient states she is taking Eliquis. Patient is currently alert, oriented x4 with no neuro deficits.

## 2021-01-07 NOTE — ED Triage Notes (Signed)
Patient reports taking her HTN medications PTA.

## 2021-01-13 ENCOUNTER — Ambulatory Visit: Payer: Medicare Other | Admitting: Hospice and Palliative Medicine

## 2021-01-18 ENCOUNTER — Other Ambulatory Visit: Payer: Self-pay

## 2021-01-18 ENCOUNTER — Ambulatory Visit: Payer: Medicare Other | Admitting: Cardiovascular Disease

## 2021-01-18 ENCOUNTER — Encounter: Payer: Self-pay | Admitting: Cardiovascular Disease

## 2021-01-18 VITALS — BP 180/78 | HR 74 | Ht 60.0 in | Wt 99.0 lb

## 2021-01-18 DIAGNOSIS — I10 Essential (primary) hypertension: Secondary | ICD-10-CM

## 2021-01-18 DIAGNOSIS — I350 Nonrheumatic aortic (valve) stenosis: Secondary | ICD-10-CM | POA: Diagnosis not present

## 2021-01-18 DIAGNOSIS — I480 Paroxysmal atrial fibrillation: Secondary | ICD-10-CM

## 2021-01-18 DIAGNOSIS — I739 Peripheral vascular disease, unspecified: Secondary | ICD-10-CM | POA: Diagnosis not present

## 2021-01-18 MED ORDER — LABETALOL HCL 300 MG PO TABS: 300.0000 mg | ORAL_TABLET | Freq: Two times a day (BID) | ORAL | 3 refills | Status: DC

## 2021-01-18 MED ORDER — HYDRALAZINE HCL 25 MG PO TABS: 25.0000 mg | ORAL_TABLET | Freq: Three times a day (TID) | ORAL | 3 refills | Status: DC

## 2021-01-18 NOTE — Patient Instructions (Signed)
Medication Instructions:  Please increase the hydralazine up to 25 mg three a day  Increase the labetalol up to 300 mg twice a day    If you need a refill on your cardiac medications before your next appointment, please call your pharmacy.    Lab work: No new labs needed   If you have labs (blood work) drawn today and your tests are completely normal, you will receive your results only by: [place] (if you have MyChart) OR A paper copy in the mail If you have any lab test that is abnormal or we need to change your treatment, we will call you to review the results.   Testing/Procedures: No new testing needed   Follow-Up: At [facility], you and your health needs are our priority.  As part of our continuing mission to provide you with exceptional heart care, we have created designated Provider Care Teams.  These Care Teams include your primary Cardiologist (physician) and Advanced Practice Providers (APPs -  Physician Assistants and Nurse Practitioners) who all work together to provide you with the care you need, when you need it.  You will need a follow up appointment in 12 months  Providers on your designated Care Team:   Murray Hodgkins, NP Christell Faith, PA-C Marrianne Mood, PA-C Cadence Kathlen Mody, [place]  Any Other Special Instructions Will Be Listed Below (If Applicable).  COVID-19 Vaccine Information can be found at: ShippingScam.co.uk For questions related to vaccine distribution or appointments, please email [email or web address] or call [phone].
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
## 2021-01-19 ENCOUNTER — Other Ambulatory Visit: Payer: Self-pay

## 2021-01-19 DIAGNOSIS — J3089 Other allergic rhinitis: Secondary | ICD-10-CM

## 2021-01-19 MED ORDER — MONTELUKAST SODIUM 10 MG PO TABS: 10.0000 mg | ORAL_TABLET | Freq: Every day | ORAL | 3 refills | Status: DC

## 2021-01-20 ENCOUNTER — Ambulatory Visit: Payer: Medicare Other | Admitting: Nurse Practitioner

## 2021-01-24 ENCOUNTER — Ambulatory Visit (INDEPENDENT_AMBULATORY_CARE_PROVIDER_SITE_OTHER): Payer: Medicare Other | Admitting: Nurse Practitioner

## 2021-01-24 ENCOUNTER — Encounter: Payer: Self-pay | Admitting: Nurse Practitioner

## 2021-01-24 ENCOUNTER — Other Ambulatory Visit: Payer: Self-pay

## 2021-01-24 VITALS — BP 160/70 | HR 70 | Temp 97.5°F | Resp 16 | Ht 60.0 in | Wt 100.2 lb

## 2021-01-24 DIAGNOSIS — M419 Scoliosis, unspecified: Secondary | ICD-10-CM

## 2021-01-24 DIAGNOSIS — I10 Essential (primary) hypertension: Secondary | ICD-10-CM

## 2021-01-24 DIAGNOSIS — Z0001 Encounter for general adult medical examination with abnormal findings: Secondary | ICD-10-CM | POA: Diagnosis not present

## 2021-01-24 DIAGNOSIS — R300 Dysuria: Secondary | ICD-10-CM | POA: Diagnosis not present

## 2021-01-24 DIAGNOSIS — M5000 Cervical disc disorder with myelopathy, unspecified cervical region: Secondary | ICD-10-CM

## 2021-01-24 MED ORDER — GABAPENTIN 100 MG PO CAPS: 200.0000 mg | ORAL_CAPSULE | Freq: Every day | ORAL | 1 refills | Status: DC

## 2021-01-24 MED ORDER — PREDNISONE 5 MG PO TABS: ORAL_TABLET | ORAL | 1 refills | Status: DC

## 2021-01-24 NOTE — Progress Notes (Signed)
[facility] [place], [place] [address]  Internal MEDICINE  Office Visit Note  Patient Name: [name]  [id number]  OM:[id number]  Date of Service: 01/24/2021  Chief Complaint  Patient presents with   Medicare Wellness    HPI [name] presents for an annual well visit and physical exam. she has a history of asthma, hypertension, atrial fibrillation, skin cancer, hysterectomy, and renal insufficiency. She has had 3 doses of the COVID vaccine. She had her tetanus vaccine in 2017. Her bone density scan was done in 2015. She is not interested in repeating the bone density scan since she is not interested in the treatment options for osteoporosis. She has occipital neuralgia and is scheduled to see a neurologist. Her daughter accompanied her to the office visit today and states that the headaches are sharp and severe and come on suddenly. Her blood pressure is significantly elevated even after rechecking it, see vitals.  Current Medication: Outpatient Encounter Medications as of 01/24/2021  Medication Sig   Ascorbic Acid (VITAMIN C) 1000 MG tablet Take 1,000 mg by mouth daily.   calcium-vitamin D (OSCAL WITH D) 500-200 MG-UNIT tablet Take 1 tablet by mouth daily with breakfast.   cholecalciferol (VITAMIN D) 1000 units tablet Take 1,000 Units by mouth daily.   clonazePAM (KLONOPIN) 1 MG tablet Take half to one tab po qhs prn for insomnia   hydrALAZINE (APRESOLINE) 25 MG tablet Take 1 tablet (25 mg total) by mouth 3 (three) times daily.   irbesartan (AVAPRO) 300 MG tablet Take Half tab po in am and half tab in pm   labetalol (NORMODYNE) 300 MG tablet Take 1 tablet (300 mg total) by mouth 2 (two) times daily.   montelukast (SINGULAIR) 10 MG tablet Take 1 tablet (10 mg total) by mouth daily.   Polyethyl Glycol-Propyl Glycol (SYSTANE OP) Apply to eye daily.   vitamin B-12 (CYANOCOBALAMIN) 1000 MCG tablet Take 1,000 mcg by mouth daily.   [DISCONTINUED] ELIQUIS 2.5 MG  TABS tablet TAKE 1 TABLET BY MOUTH TWICE A DAY   [DISCONTINUED] gabapentin (NEURONTIN) 100 MG capsule Take 2 capsules (200 mg total) by mouth at bedtime.   [DISCONTINUED] predniSONE (DELTASONE) 5 MG tablet TAKE 1 TABLET BY MOUTH EVERY DAY WITH BREAKFAST   gabapentin (NEURONTIN) 100 MG capsule Take 2 capsules (200 mg total) by mouth at bedtime.   predniSONE (DELTASONE) 5 MG tablet TAKE 1 TABLET BY MOUTH EVERY DAY WITH BREAKFAST   [DISCONTINUED] PROAIR HFA 108 (90 Base) MCG/ACT inhaler Inhale 2 puffs into the lungs every 4 (four) hours as needed.  (Patient not taking: Reported on 01/18/2021)   [DISCONTINUED] tiZANidine (ZANAFLEX) 2 MG tablet Take 2 mg by mouth every 6 (six) hours as needed. (Patient not taking: Reported on 01/18/2021)   No facility-administered encounter medications on file as of 01/24/2021.    Surgical History: Past Surgical History:  Procedure Laterality Date   ABDOMINAL HYSTERECTOMY     CATARACT EXTRACTION W/PHACO Right 07/01/2019   Procedure: CATARACT EXTRACTION PHACO AND INTRAOCULAR LENS PLACEMENT (IOC) RIGHT 9.25 01:14.7 ;  Surgeon: Birder Robson, MD;  Location: [place];  Service: Ophthalmology;  Laterality: Right;   CATARACT EXTRACTION W/PHACO Left 09/09/2019   Procedure: CATARACT EXTRACTION PHACO AND INTRAOCULAR LENS PLACEMENT (IOC) LEFT 8.29  01:03.3;  Surgeon: Birder Robson, MD;  Location: [facility];  Service: Ophthalmology;  Laterality: Left;   COLONOSCOPY     RENAL ARTERY STENT     TONSILLECTOMY     TOOTH EXTRACTION  Medical History: Past Medical History:  Diagnosis Date   A-fib ([place])    Asthma    in past   Heart murmur    Pt states she has Heart Murmur   Hypertension    Lower leg injury 04/24/2016   08/06/17- pt reports legs still not completely healed   Renal insufficiency    10%-Right, 90%-Left (per pt)   Skin cancer     Family History: Family History  Problem Relation Age of Onset   Hypertension Mother    Cancer  Sister    Cancer Niece    Cancer Nephew     Social History   Socioeconomic History   Marital status: Widowed    Spouse name: Not on file   Number of children: Not on file   Years of education: Not on file   Highest education level: Not on file  Occupational History   Not on file  Tobacco Use   Smoking status: Never   Smokeless tobacco: Never  Vaping Use   Vaping Use: Never used  Substance and Sexual Activity   Alcohol use: No   Drug use: No   Sexual activity: Not on file  Other Topics Concern   Not on file  Social History Narrative   Not on file   Social Determinants of Health   Financial Resource Strain: Not on file  Food Insecurity: Not on file  Transportation Needs: Not on file  Physical Activity: Not on file  Stress: Not on file  Social Connections: Not on file  Intimate Partner Violence: Not on file      Review of Systems  Constitutional:  Negative for activity change, appetite change, chills, fatigue, fever and unexpected weight change.  HENT: Negative.  Negative for congestion, ear pain, rhinorrhea, sore throat and trouble swallowing.   Eyes: Negative.   Respiratory: Negative.  Negative for cough, chest tightness, shortness of breath and wheezing.   Cardiovascular: Negative.  Negative for chest pain.  Gastrointestinal: Negative.  Negative for abdominal pain, blood in stool, constipation, diarrhea, nausea and vomiting.  Endocrine: Negative.   Genitourinary: Negative.  Negative for difficulty urinating, dysuria, frequency, hematuria and urgency.  Musculoskeletal:  Positive for back pain. Negative for arthralgias, joint swelling, myalgias and neck pain.  Skin: Negative.  Negative for rash and wound.  Allergic/Immunologic: Negative.  Negative for immunocompromised state.  Neurological:  Positive for headaches. Negative for dizziness, seizures and numbness.  Hematological: Negative.   Psychiatric/Behavioral: Negative.  Negative for behavioral problems,  self-injury and suicidal ideas. The patient is not nervous/anxious.    Vital Signs: BP (!) 160/70 Comment: 192/70  Pulse 70   Temp (!) 97.5 F (36.4 C)   Resp 16   Ht 5' (1.524 m)   Wt 100 lb 3.2 oz (45.5 kg)   SpO2 98%   BMI 19.57 kg/m    Physical Exam Vitals reviewed.  Constitutional:      General: She is awake. She is not in acute distress.    Appearance: Normal appearance. She is well-developed, well-groomed and normal weight. She is not ill-appearing or diaphoretic.  HENT:     Head: Normocephalic and atraumatic.     Right Ear: Tympanic membrane, ear canal and external ear normal.     Left Ear: Tympanic membrane, ear canal and external ear normal.     Nose: Nose normal. No congestion or rhinorrhea.     Mouth/Throat:     Lips: Pink.     Mouth: Mucous membranes are moist.  Pharynx: Oropharynx is clear. Uvula midline. No oropharyngeal exudate or posterior oropharyngeal erythema.  Eyes:     General: Lids are normal. Vision grossly intact. Gaze aligned appropriately.     Extraocular Movements: Extraocular movements intact.     Conjunctiva/sclera: Conjunctivae normal.     Pupils: Pupils are equal, round, and reactive to light.     Funduscopic exam:    Right eye: Red reflex present.        Left eye: Red reflex present. Neck:     Vascular: No carotid bruit.     Trachea: Trachea and phonation normal. No tracheal deviation.  Cardiovascular:     Rate and Rhythm: Normal rate and regular rhythm.     Pulses:          Carotid pulses are 3+ on the right side and 3+ on the left side.      Radial pulses are 2+ on the right side and 2+ on the left side.       Dorsalis pedis pulses are 1+ on the right side and 1+ on the left side.       Posterior tibial pulses are 1+ on the right side and 1+ on the left side.     Heart sounds: S1 normal and S2 normal. Murmur heard.    No friction rub. No gallop.  Pulmonary:     Effort: Pulmonary effort is normal. No accessory muscle usage or  respiratory distress.     Breath sounds: Normal breath sounds and air entry. No wheezing.  Abdominal:     General: Bowel sounds are normal.     Palpations: Abdomen is soft. There is no shifting dullness, fluid wave, mass or pulsatile mass.     Tenderness: There is no abdominal tenderness. There is no guarding or rebound.  Musculoskeletal:        General: No swelling, deformity or signs of injury.     Cervical back: Normal range of motion and neck supple.     Right lower leg: 1+ Edema present.     Left lower leg: 1+ Edema present.  Lymphadenopathy:     Cervical: No cervical adenopathy.  Skin:    General: Skin is warm and dry.     Capillary Refill: Capillary refill takes less than 2 seconds.  Neurological:     Mental Status: She is alert and oriented to person, place, and time.     Cranial Nerves: No cranial nerve deficit.     Coordination: Coordination normal.     Gait: Gait normal.  Psychiatric:        Mood and Affect: Mood and affect normal.        Behavior: Behavior normal. Behavior is cooperative.        Thought Content: Thought content normal.        Judgment: Judgment normal.     Assessment/Plan: 1. Encounter for routine adult health examination with abnormal findings Age-appropriate preventive screenings and vaccinations discussed, annual physical exam completed. Routine labs for health maintenance not ordered. She recently had some labs drawn earlier this month and preferred not to have anymore labs drawn at this time. PHM updated.   2. Systolic hypertension, isolated DBP is wnl, SBP is consistently elevated. She is on several blood pressure medications. She was recently seen by her cardiologist, Dr. Rockey Situ, and he increased her hydralazine and labetalol dose on 01/18/21. Blood pressure is improved compared to her blood pressure when she was seen by Dr. Rockey Situ. No changes in blood pressure medications  will be made today. Patient will follow up with cardiology.  3. Scoliosis  of lumbar spine, unspecified scoliosis type No changes, refills ordered.  - predniSONE (DELTASONE) 5 MG tablet; TAKE 1 TABLET BY MOUTH EVERY DAY WITH BREAKFAST  Dispense: 90 tablet; Refill: 1 - gabapentin (NEURONTIN) 100 MG capsule; Take 2 capsules (200 mg total) by mouth at bedtime.  Dispense: 180 capsule; Refill: 1  4. Cervical disc disease with myelopathy Refills ordered, will see neurology on 02/09/21. - predniSONE (DELTASONE) 5 MG tablet; TAKE 1 TABLET BY MOUTH EVERY DAY WITH BREAKFAST  Dispense: 90 tablet; Refill: 1 - gabapentin (NEURONTIN) 100 MG capsule; Take 2 capsules (200 mg total) by mouth at bedtime.  Dispense: 180 capsule; Refill: 1  5. Dysuria Routine urinalysis done. - UA/M w/rflx Culture, Routine - Microscopic Examination - Urine Culture, Reflex    General Counseling: Caylie verbalizes understanding of the findings of todays visit and agrees with plan of treatment. I have discussed any further diagnostic evaluation that may be needed or ordered today. We also reviewed her medications today. she has been encouraged to call the office with any questions or concerns that should arise related to todays visit.    Orders Placed This Encounter  Procedures   Microscopic Examination   Urine Culture, Reflex   UA/M w/rflx Culture, Routine    Meds ordered this encounter  Medications   predniSONE (DELTASONE) 5 MG tablet    Sig: TAKE 1 TABLET BY MOUTH EVERY DAY WITH BREAKFAST    Dispense:  90 tablet    Refill:  1   gabapentin (NEURONTIN) 100 MG capsule    Sig: Take 2 capsules (200 mg total) by mouth at bedtime.    Dispense:  180 capsule    Refill:  1    Return in about 18 weeks (around 05/30/2021) for F/U, med refill, Marlon Suleiman PCP.   Total time spent:30 Minutes Time spent includes review of chart, medications, test results, and follow up plan with the patient.   [place] Controlled Substance Database was reviewed by me.  This patient was seen by Jonetta Osgood, FNP-C in  collaboration with Dr. Clayborn Bigness as a part of collaborative care agreement.  Babak Lucus R. Valetta Fuller, MSN, FNP-C Internal medicine

## 2021-01-27 LAB — UA/M W/RFLX CULTURE, ROUTINE
Bilirubin, UA: NEGATIVE
Glucose, UA: NEGATIVE
Ketones, UA: NEGATIVE
Nitrite, UA: NEGATIVE
Protein,UA: NEGATIVE
RBC, UA: NEGATIVE
Specific Gravity, UA: 1.018 (ref 1.005–1.030)
Urobilinogen, Ur: 0.2 mg/dL (ref 0.2–1.0)
pH, UA: 5.5 (ref 5.0–7.5)

## 2021-01-27 LAB — MICROSCOPIC EXAMINATION
Bacteria, UA: NONE SEEN
Casts: NONE SEEN /lpf
RBC, Urine: NONE SEEN /hpf (ref 0–2)
WBC, UA: NONE SEEN /hpf (ref 0–5)

## 2021-01-27 LAB — URINE CULTURE, REFLEX

## 2021-02-01 NOTE — Addendum Note (Signed)
Addended by: Anselm Pancoast on: 02/01/2021 02:34 PM   Modules accepted: Orders

## 2021-02-02 ENCOUNTER — Other Ambulatory Visit: Payer: Self-pay | Admitting: Cardiovascular Disease

## 2021-02-02 NOTE — Telephone Encounter (Signed)
Please review for refill, thanks ! 

## 2021-02-02 NOTE — Telephone Encounter (Signed)
Prescription refill request for Eliquis received. Indication:afib  Last office visit:[place], 01/18/2021 Scr: 0.92, 01/07/2021 Age: 87 yo  Weight: 45.5 kg   Refill sent.

## 2021-02-07 ENCOUNTER — Telehealth: Payer: Self-pay

## 2021-02-07 ENCOUNTER — Other Ambulatory Visit: Payer: Self-pay

## 2021-02-07 DIAGNOSIS — M419 Scoliosis, unspecified: Secondary | ICD-10-CM

## 2021-02-07 DIAGNOSIS — M5000 Cervical disc disorder with myelopathy, unspecified cervical region: Secondary | ICD-10-CM

## 2021-02-07 MED ORDER — GABAPENTIN 100 MG PO CAPS: ORAL_CAPSULE | ORAL | 1 refills | Status: DC

## 2021-02-07 NOTE — Telephone Encounter (Signed)
Spoke with dr Humphrey Rolls she was ok to send gabapentin 100 mg 1 tab po in AM, 1 tab po in afternoon and 2 tab po at bedtime and send to phar

## 2021-02-07 NOTE — Telephone Encounter (Signed)
Please change

## 2021-02-09 ENCOUNTER — Ambulatory Visit: Payer: Medicare Other | Admitting: Nurse Practitioner

## 2021-02-18 ENCOUNTER — Other Ambulatory Visit: Payer: Self-pay | Admitting: Internal Medicine

## 2021-02-18 DIAGNOSIS — I10 Essential (primary) hypertension: Secondary | ICD-10-CM

## 2021-03-14 ENCOUNTER — Other Ambulatory Visit: Payer: Self-pay | Admitting: Internal Medicine

## 2021-03-14 DIAGNOSIS — I10 Essential (primary) hypertension: Secondary | ICD-10-CM

## 2021-04-17 ENCOUNTER — Other Ambulatory Visit: Payer: Self-pay | Admitting: Cardiovascular Disease

## 2021-04-17 DIAGNOSIS — I10 Essential (primary) hypertension: Secondary | ICD-10-CM

## 2021-04-29 ENCOUNTER — Other Ambulatory Visit: Payer: Self-pay | Admitting: Internal Medicine

## 2021-04-29 DIAGNOSIS — I10 Essential (primary) hypertension: Secondary | ICD-10-CM

## 2021-05-13 ENCOUNTER — Encounter: Payer: Medicare Other | Attending: Physician Assistant | Admitting: Physician Assistant

## 2021-05-13 ENCOUNTER — Other Ambulatory Visit: Payer: Self-pay

## 2021-05-13 DIAGNOSIS — I10 Essential (primary) hypertension: Secondary | ICD-10-CM | POA: Insufficient documentation

## 2021-05-13 DIAGNOSIS — C44722 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, including hip: Secondary | ICD-10-CM | POA: Diagnosis not present

## 2021-05-13 DIAGNOSIS — L97812 Non-pressure chronic ulcer of other part of right lower leg with fat layer exposed: Secondary | ICD-10-CM | POA: Diagnosis not present

## 2021-05-13 DIAGNOSIS — Z923 Personal history of irradiation: Secondary | ICD-10-CM | POA: Insufficient documentation

## 2021-05-13 NOTE — Progress Notes (Signed)
AQUILA, MENZIE ([id number]) Visit Report for 05/13/2021 Abuse/Suicide Risk Screen Details Patient Name: [name], [name] Date of Service: 05/13/2021 8:45 AM Medical Record Number: [id number] Patient Account Number: [id number] Date of Birth/Sex: [birth year] (87 y.o. F) Treating RN: Carlene Coria Primary Care Darrik Richman: Clayborn Bigness Other Clinician: Referring Partick Musselman: Referral, Self Treating Marcello Tuzzolino/Extender: Skipper Cliche in Treatment: 0 Abuse/Suicide Risk Screen Items Answer ABUSE RISK SCREEN: Has anyone close to you tried to hurt or harm you recentlyo No Do you feel uncomfortable with anyone in your familyo No Has anyone forced you do things that you didnot want to doo No Electronic Signature(s) Signed: 05/13/2021 4:49:51 PM By: Carlene Coria RN Entered By: Carlene Coria on 05/13/2021 09:04:37 [name] ([id number]) -------------------------------------------------------------------------------- Activities of Daily Living Details Patient Name: [name] Date of Service: 05/13/2021 8:45 AM Medical Record Number: [id number] Patient Account Number: [id number] Date of Birth/Sex: [birth year] (87 y.o. F) Treating RN: Carlene Coria Primary Care Kaysee Hergert: Clayborn Bigness Other Clinician: Referring Bexlee Bergdoll: Referral, Self Treating Bentlie Withem/Extender: Skipper Cliche in Treatment: 0 Activities of Daily Living Items Answer Activities of Daily Living (Please select one for each item) Drive Automobile Not Able Take Medications Need Assistance Use Telephone Need Assistance Care for Appearance Need Assistance Use Toilet Need Assistance Bath / Shower Need Assistance Dress Self Need Assistance Feed Self Completely Able Walk Need Assistance Get In / Out Bed Need Assistance Housework Need Assistance Prepare Meals Not Able Handle Money Need Assistance Shop for Self Need Assistance Electronic Signature(s) Signed: 05/13/2021 4:49:51 PM By: Carlene Coria RN Entered By:  Carlene Coria on 05/13/2021 09:05:42 [name] ([id number]) -------------------------------------------------------------------------------- Education Screening Details Patient Name: [name] Date of Service: 05/13/2021 8:45 AM Medical Record Number: [id number] Patient Account Number: [id number] Date of Birth/Sex: 03/16/1929 (87 y.o. F) Treating RN: Carlene Coria Primary Care Reghan Thul: Clayborn Bigness Other Clinician: Referring Sanora Cunanan: Referral, Self Treating Adar Rase/Extender: Skipper Cliche in Treatment: 0 Primary Learner Assessed: Patient Learning Preferences/Education Level/Primary Language Learning Preference: Explanation Highest Education Level: High School Preferred Language: English Cognitive Barrier Language Barrier: No Translator Needed: No Memory Deficit: No Emotional Barrier: No Physical Barrier Impaired Vision: Yes Glasses Impaired Hearing: No Decreased Hand dexterity: No Knowledge/Comprehension Knowledge Level: Medium Comprehension Level: High Ability to understand written instructions: High Ability to understand verbal instructions: High Motivation Anxiety Level: Anxious Cooperation: Cooperative Education Importance: Acknowledges Need Interest in Health Problems: Asks Questions Perception: Coherent Willingness to Engage in Self-Management High Activities: Readiness to Engage in Self-Management High Activities: Electronic Signature(s) Signed: 05/13/2021 4:49:51 PM By: Carlene Coria RN Entered By: Carlene Coria on 05/13/2021 09:06:24 [name] ([id number]) -------------------------------------------------------------------------------- Fall Risk Assessment Details Patient Name: [name] Date of Service: 05/13/2021 8:45 AM Medical Record Number: [id number] Patient Account Number: [id number] Date of Birth/Sex: 05/12/1929 (87 y.o. F) Treating RN: Carlene Coria Primary Care Thresia Ramanathan: Clayborn Bigness Other Clinician: Referring  Gresham Caetano: Referral, Self Treating Lezette Kitts/Extender: Skipper Cliche in Treatment: 0 Fall Risk Assessment Items Have you had 2 or more falls in the last 12 monthso 0 No Have you had any fall that resulted in injury in the last 12 monthso 0 No FALLS RISK SCREEN History of falling - immediate or within 3 months 25 Yes Secondary diagnosis (Do you have 2 or more medical diagnoseso) 0 No Ambulatory aid None/bed rest/wheelchair/nurse 0 No Crutches/cane/walker 0 No Furniture 0 No Intravenous therapy Access/Saline/Heparin Lock 0 No Gait/Transferring Normal/ bed rest/ wheelchair 0 No Weak (short steps with or without shuffle, stooped but able to lift head while walking, may 0 No  seek support from furniture) Impaired (short steps with shuffle, may have difficulty arising from chair, head down, impaired 0 No balance) Mental Status Oriented to own ability 0 No Electronic Signature(s) Signed: 05/13/2021 4:49:51 PM By: Carlene Coria RN Entered By: Carlene Coria on 05/13/2021 09:06:46 [place], [name] ([id number]) -------------------------------------------------------------------------------- Foot Assessment Details Patient Name: [name] Date of Service: 05/13/2021 8:45 AM Medical Record Number: [id number] Patient Account Number: [id number] Date of Birth/Sex: 1928/07/21 (87 y.o. F) Treating RN: Carlene Coria Primary Care Jalan Fariss: Clayborn Bigness Other Clinician: Referring Sulay Brymer: Referral, Self Treating Layney Gillson/Extender: Skipper Cliche in Treatment: 0 Foot Assessment Items Site Locations + = Sensation present, - = Sensation absent, C = Callus, U = Ulcer R = Redness, W = Warmth, M = Maceration, PU = Pre-ulcerative lesion F = Fissure, S = Swelling, D = Dryness Assessment Right: Left: Other Deformity: No No Prior Foot Ulcer: No No Prior Amputation: No No Charcot Joint: No No Ambulatory Status: Ambulatory Without Help Gait: Steady Electronic Signature(s) Signed:  05/13/2021 4:49:51 PM By: Carlene Coria RN Entered By: Carlene Coria on 05/13/2021 09:13:49 [place], [name] ([id number]) -------------------------------------------------------------------------------- Nutrition Risk Screening Details Patient Name: [name] Date of Service: 05/13/2021 8:45 AM Medical Record Number: [id number] Patient Account Number: [id number] Date of Birth/Sex: 02/21/1929 (87 y.o. F) Treating RN: Carlene Coria Primary Care Tyonna Talerico: Clayborn Bigness Other Clinician: Referring Nolen Lindamood: Referral, Self Treating Zeena Starkel/Extender: Skipper Cliche in Treatment: 0 Height (in): 60 Weight (lbs): 99 Body Mass Index (BMI): 19.3 Nutrition Risk Screening Items Score Screening NUTRITION RISK SCREEN: I have an illness or condition that made me change the kind and/or amount of food I eat 0 No I eat fewer than two meals per day 0 No I eat few fruits and vegetables, or milk products 0 No I have three or more drinks of beer, liquor or wine almost every day 0 No I have tooth or mouth problems that make it hard for me to eat 0 No I don't always have enough money to buy the food I need 0 No I eat alone most of the time 0 No I take three or more different prescribed or over-the-counter drugs a day 1 Yes Without wanting to, I have lost or gained 10 pounds in the last six months 0 No I am not always physically able to shop, cook and/or feed myself 2 Yes Nutrition Protocols Good Risk Protocol Moderate Risk Protocol 0 Provide education on nutrition High Risk Proctocol Risk Level: Moderate Risk Score: 3 Electronic Signature(s) Signed: 05/13/2021 4:49:51 PM By: Carlene Coria RN Entered By: Carlene Coria on 05/13/2021 09:06:57

## 2021-05-13 NOTE — Progress Notes (Signed)
AURIA, MCKINLAY ([id number]) Visit Report for 05/13/2021 Allergy List Details Patient Name: [name], [name] Date of Service: 05/13/2021 8:45 AM Medical Record Number: [id number] Patient Account Number: [id number] Date of Birth/Sex: May 07, 1929 (87 y.o. F) Treating RN: Carlene Coria Primary Care Laker Thompson: Clayborn Bigness Other Clinician: Referring Neveyah Garzon: Referral, Self Treating Andreas Sobolewski/Extender: Jeri Cos Weeks in Treatment: 0 Allergies Active Allergies penicillin bacitracin Allergy Notes Electronic Signature(s) Signed: 05/13/2021 4:49:51 PM By: Carlene Coria RN Entered By: Carlene Coria on 05/13/2021 09:03:35 [name] ([id number]) -------------------------------------------------------------------------------- Arrival Information Details Patient Name: [name] Date of Service: 05/13/2021 8:45 AM Medical Record Number: [id number] Patient Account Number: [id number] Date of Birth/Sex: 11-Mar-1929 (87 y.o. F) Treating RN: Carlene Coria Primary Care Shivonne Schwartzman: Clayborn Bigness Other Clinician: Referring Olufemi Mofield: Referral, Self Treating Vaishali Baise/Extender: Skipper Cliche in Treatment: 0 Visit Information Patient Arrived: Cane Arrival Time: 08:58 Accompanied By: daughter Transfer Assistance: None Patient Identification Verified: Yes Secondary Verification Process Completed: Yes Patient Requires Transmission-Based Precautions: No Patient Has Alerts: No History Since Last Visit All ordered tests and consults were completed: No Added or deleted any medications: No Any new allergies or adverse reactions: No Had a fall or experienced change in activities of daily living that may affect risk of falls: No Signs or symptoms of abuse/neglect since last visito No Hospitalized since last visit: No Implantable device outside of the clinic excluding cellular tissue based products placed in the center since last visit: No Electronic Signature(s) Signed: 05/13/2021 4:49:51 PM  By: Carlene Coria RN Entered By: Carlene Coria on 05/13/2021 08:59:26 [name], [name] ([id number]) -------------------------------------------------------------------------------- Clinic Level of Care Assessment Details Patient Name: [name] Date of Service: 05/13/2021 8:45 AM Medical Record Number: [id number] Patient Account Number: [id number] Date of Birth/Sex: 04-15-1929 (87 y.o. F) Treating RN: Carlene Coria Primary Care Clark Clowdus: Clayborn Bigness Other Clinician: Referring Lujean Ebright: Referral, Self Treating Gabriella Guile/Extender: Skipper Cliche in Treatment: 0 Clinic Level of Care Assessment Items TOOL 2 Quantity Score X - Use when only an EandM is performed on the INITIAL visit 1 0 ASSESSMENTS - Nursing Assessment / Reassessment X - General Physical Exam (combine w/ comprehensive assessment (listed just below) when performed on new 1 20 pt. evals) X- 1 25 Comprehensive Assessment (HX, ROS, Risk Assessments, Wounds Hx, etc.) ASSESSMENTS - Wound and Skin Assessment / Reassessment []  - Simple Wound Assessment / Reassessment - one wound 0 X- 2 5 Complex Wound Assessment / Reassessment - multiple wounds []  - 0 Dermatologic / Skin Assessment (not related to wound area) ASSESSMENTS - Ostomy and/or Continence Assessment and Care []  - Incontinence Assessment and Management 0 []  - 0 Ostomy Care Assessment and Management (repouching, etc.) PROCESS - Coordination of Care []  - Simple Patient / Family Education for ongoing care 0 X- 1 20 Complex (extensive) Patient / Family Education for ongoing care []  - 0 Staff obtains Programmer, systems, Records, Test Results / Process Orders []  - 0 Staff telephones HHA, Nursing Homes / Clarify orders / etc []  - 0 Routine Transfer to another Facility (non-emergent condition) []  - 0 Routine Hospital Admission (non-emergent condition) []  - 0 New Admissions / Biomedical engineer / Ordering NPWT, Apligraf, etc. X- 1 20 Emergency Hospital Admission  (emergent condition) X- 1 10 Simple Discharge Coordination []  - 0 Complex (extensive) Discharge Coordination PROCESS - Special Needs []  - Pediatric / Minor Patient Management 0 []  - 0 Isolation Patient Management []  - 0 Hearing / Language / Visual special needs []  - 0 Assessment of Community assistance (transportation, D/C planning, etc.) []  - 0 Additional  assistance / Altered mentation []  - 0 Support Surface(s) Assessment (bed, cushion, seat, etc.) INTERVENTIONS - Wound Cleansing / Measurement X - Wound Imaging (photographs - any number of wounds) 1 5 []  - 0 Wound Tracing (instead of photographs) []  - 0 Simple Wound Measurement - one wound X- 2 5 Complex Wound Measurement - multiple wounds [name], [name] ([id number]) []  - 0 Simple Wound Cleansing - one wound X- 2 5 Complex Wound Cleansing - multiple wounds INTERVENTIONS - Wound Dressings X - Small Wound Dressing one or multiple wounds 2 10 []  - 0 Medium Wound Dressing one or multiple wounds []  - 0 Large Wound Dressing one or multiple wounds []  - 0 Application of Medications - injection INTERVENTIONS - Miscellaneous []  - External ear exam 0 []  - 0 Specimen Collection (cultures, biopsies, blood, body fluids, etc.) []  - 0 Specimen(s) / Culture(s) sent or taken to Lab for analysis []  - 0 Patient Transfer (multiple staff / Civil Service fast streamer / Similar devices) []  - 0 Simple Staple / Suture removal (25 or less) []  - 0 Complex Staple / Suture removal (26 or more) []  - 0 Hypo / Hyperglycemic Management (close monitor of Blood Glucose) []  - 0 Ankle / Brachial Index (ABI) - do not check if billed separately Has the patient been seen at the hospital within the last three years: Yes Total Score: 150 Level Of Care: New/Established - Level 4 Electronic Signature(s) Signed: 05/13/2021 4:49:51 PM By: Carlene Coria RN Entered By: Carlene Coria on 05/13/2021 10:05:48 [name]  ([id number]) -------------------------------------------------------------------------------- Encounter Discharge Information Details Patient Name: [name] Date of Service: 05/13/2021 8:45 AM Medical Record Number: [id number] Patient Account Number: [id number] Date of Birth/Sex: 12/02/1928 (87 y.o. F) Treating RN: Carlene Coria Primary Care Yareliz Thorstenson: Clayborn Bigness Other Clinician: Referring Yariah Selvey: Referral, Self Treating Manar Smalling/Extender: Skipper Cliche in Treatment: 0 Encounter Discharge Information Items Discharge Condition: Stable Ambulatory Status: Ambulatory Discharge Destination: Home Transportation: Private Auto Accompanied By: Derek Jack Schedule Follow-up Appointment: No Clinical Summary of Care: Patient Declined Electronic Signature(s) Signed: 05/13/2021 10:07:45 AM By: Carlene Coria RN Entered By: Carlene Coria on 05/13/2021 10:07:45 [name] ([id number]) -------------------------------------------------------------------------------- Lower Extremity Assessment Details Patient Name: [name] Date of Service: 05/13/2021 8:45 AM Medical Record Number: [id number] Patient Account Number: [id number] Date of Birth/Sex: May 27, 1929 (87 y.o. F) Treating RN: Carlene Coria Primary Care Breyson Kelm: Clayborn Bigness Other Clinician: Referring Yeraldin Litzenberger: Referral, Self Treating Kandyce Dieguez/Extender: Skipper Cliche in Treatment: 0 Edema Assessment Assessed: [Left: No] [Right: No] Edema: [Left: Ye] [Right: s] Calf Left: Right: Point of Measurement: 32 cm From Medial Instep 30 cm Ankle Left: Right: Point of Measurement: 10 cm From Medial Instep 19 cm Vascular Assessment Pulses: Dorsalis Pedis Palpable: [Right:Yes] Electronic Signature(s) Signed: 05/13/2021 4:49:51 PM By: Carlene Coria RN Entered By: Carlene Coria on 05/13/2021 09:20:57 [name] ([id number]) -------------------------------------------------------------------------------- Multi  Wound Chart Details Patient Name: [name] Date of Service: 05/13/2021 8:45 AM Medical Record Number: [id number] Patient Account Number: [id number] Date of Birth/Sex: 02-Oct-1928 (87 y.o. F) Treating RN: Carlene Coria Primary Care Jalana Moore: Clayborn Bigness Other Clinician: Referring Keigan Girten: Referral, Self Treating Krayton Wortley/Extender: Skipper Cliche in Treatment: 0 Vital Signs Height(in): 60 Pulse(bpm): 38 Weight(lbs): 84 Blood Pressure(mmHg): 174/81 Body Mass Index(BMI): 19 Temperature(F): 98 Respiratory Rate(breaths/min): 18 Photos: [N/A:N/A] Wound Location: Right, Anterior Lower Leg Right, Lateral Lower Leg N/A Wounding Event: Gradually Appeared Gradually Appeared N/A Primary Etiology: Atypical Atypical N/A Comorbid History: Asthma, Hypertension Asthma, Hypertension N/A Date Acquired: 03/29/2021 03/29/2021 N/A Weeks of Treatment: 0 0 N/A Wound  Status: Open Open N/A Measurements L x W x D (cm) 0.5x0.7x0.1 0.6x1.6x0.1 N/A Area (cm) : 0.275 0.754 N/A Volume (cm) : 0.027 0.075 N/A Classification: Full Thickness Without Exposed Full Thickness Without Exposed N/A Support Structures Support Structures Exudate Amount: Medium Medium N/A Exudate Type: Serosanguineous Serosanguineous N/A Exudate Color: red, brown red, brown N/A Granulation Amount: Large (67-100%) Large (67-100%) N/A Granulation Quality: Red Red N/A Necrotic Amount: None Present (0%) None Present (0%) N/A Exposed Structures: Fat Layer (Subcutaneous Tissue): Fat Layer (Subcutaneous Tissue): N/A Yes Yes Fascia: No Fascia: No Tendon: No Tendon: No Muscle: No Muscle: No Joint: No Joint: No Bone: No Bone: No Epithelialization: None None N/A Treatment Notes Electronic Signature(s) Signed: 05/13/2021 4:49:51 PM By: Carlene Coria RN Entered By: Carlene Coria on 05/13/2021 09:46:14 [name]  ([id number]) -------------------------------------------------------------------------------- [place] Details Patient Name: [name] Date of Service: 05/13/2021 8:45 AM Medical Record Number: [id number] Patient Account Number: [id number] Date of Birth/Sex: 12-15-1928 (87 y.o. F) Treating RN: Carlene Coria Primary Care Elisabet Gutzmer: Clayborn Bigness Other Clinician: Referring Coda Filler: Referral, Self Treating Estill Llerena/Extender: Skipper Cliche in Treatment: 0 Active Inactive Electronic Signature(s) Signed: 05/13/2021 4:49:51 PM By: Carlene Coria RN Entered By: Carlene Coria on 05/13/2021 09:45:55 Shook, [name] ([id number]) -------------------------------------------------------------------------------- Pain Assessment Details Patient Name: [name] Date of Service: 05/13/2021 8:45 AM Medical Record Number: [id number] Patient Account Number: [id number] Date of Birth/Sex: [birth year] (87 y.o. F) Treating RN: Carlene Coria Primary Care Gloria Ricardo: Clayborn Bigness Other Clinician: Referring Loda Bialas: Referral, Self Treating Kaelie Henigan/Extender: Skipper Cliche in Treatment: 0 Active Problems Location of Pain Severity and Description of Pain Patient Has Paino No Site Locations Pain Management and Medication Current Pain Management: Electronic Signature(s) Signed: 05/13/2021 4:49:51 PM By: Carlene Coria RN Entered By: Carlene Coria on 05/13/2021 09:00:04 [name] ([id number]) -------------------------------------------------------------------------------- Patient/Caregiver Education Details Patient Name: [name] Date of Service: 05/13/2021 8:45 AM Medical Record Number: [id number] Patient Account Number: [id number] Date of Birth/Gender: March 10, 1929 (87 y.o. F) Treating RN: Carlene Coria Primary Care Physician: Clayborn Bigness Other Clinician: Referring Physician: Referral, Self Treating Physician/Extender: Skipper Cliche in Treatment:  0 Education Assessment Education Provided To: Patient Education Topics Provided Wound/Skin Impairment: Methods: Explain/Verbal Responses: State content correctly Electronic Signature(s) Signed: 05/13/2021 4:49:51 PM By: Carlene Coria RN Entered By: Carlene Coria on 05/13/2021 10:06:35 [name] ([id number]) -------------------------------------------------------------------------------- Wound Assessment Details Patient Name: [name] Date of Service: 05/13/2021 8:45 AM Medical Record Number: [id number] Patient Account Number: [id number] Date of Birth/Sex: 06-25-1928 (87 y.o. F) Treating RN: Carlene Coria Primary Care Ida Milbrath: Clayborn Bigness Other Clinician: Referring Hazleigh Mccleave: Referral, Self Treating Venise Ellingwood/Extender: Skipper Cliche in Treatment: 0 Wound Status Wound Number: 6 Primary Etiology: Atypical Wound Location: Right, Anterior Lower Leg Wound Status: Open Wounding Event: Gradually Appeared Comorbid History: Asthma, Hypertension Date Acquired: 03/29/2021 Weeks Of Treatment: 0 Clustered Wound: No Photos Wound Measurements Length: (cm) 0.5 Width: (cm) 0.7 Depth: (cm) 0.1 Area: (cm) 0.275 Volume: (cm) 0.027 % Reduction in Area: % Reduction in Volume: Epithelialization: None Tunneling: No Undermining: No Wound Description Classification: Full Thickness Without Exposed Support Structu Exudate Amount: Medium Exudate Type: Serosanguineous Exudate Color: red, brown res Foul Odor After Cleansing: No Slough/Fibrino Yes Wound Bed Granulation Amount: Large (67-100%) Exposed Structure Granulation Quality: Red Fascia Exposed: No Necrotic Amount: None Present (0%) Fat Layer (Subcutaneous Tissue) Exposed: Yes Tendon Exposed: No Muscle Exposed: No Joint Exposed: No Bone Exposed: No Treatment Notes Wound #6 (Lower Leg) Wound Laterality: Right, Anterior Cleanser Peri-Wound Care Topical Primary Dressing Couzens, Jamyla ([id number])  Secondary  Dressing Secured With Compression Wrap Compression Stockings Add-Ons Electronic Signature(s) Signed: 05/13/2021 4:49:51 PM By: Carlene Coria RN Entered By: Carlene Coria on 05/13/2021 09:17:58 Collums, [name] ([id number]) -------------------------------------------------------------------------------- Wound Assessment Details Patient Name: [name] Date of Service: 05/13/2021 8:45 AM Medical Record Number: [id number] Patient Account Number: [id number] Date of Birth/Sex: Feb 15, 1929 (87 y.o. F) Treating RN: Carlene Coria Primary Care Shadell Brenn: Clayborn Bigness Other Clinician: Referring Cavin Longman: Referral, Self Treating Aundray Cartlidge/Extender: Skipper Cliche in Treatment: 0 Wound Status Wound Number: 7 Primary Etiology: Atypical Wound Location: Right, Lateral Lower Leg Wound Status: Open Wounding Event: Gradually Appeared Comorbid History: Asthma, Hypertension Date Acquired: 03/29/2021 Weeks Of Treatment: 0 Clustered Wound: No Photos Wound Measurements Length: (cm) 0.6 Width: (cm) 1.6 Depth: (cm) 0.1 Area: (cm) 0.754 Volume: (cm) 0.075 % Reduction in Area: % Reduction in Volume: Epithelialization: None Tunneling: No Undermining: No Wound Description Classification: Full Thickness Without Exposed Support Structu Exudate Amount: Medium Exudate Type: Serosanguineous Exudate Color: red, brown res Foul Odor After Cleansing: No Slough/Fibrino No Wound Bed Granulation Amount: Large (67-100%) Exposed Structure Granulation Quality: Red Fascia Exposed: No Necrotic Amount: None Present (0%) Fat Layer (Subcutaneous Tissue) Exposed: Yes Tendon Exposed: No Muscle Exposed: No Joint Exposed: No Bone Exposed: No Treatment Notes Wound #7 (Lower Leg) Wound Laterality: Right, Lateral Cleanser Peri-Wound Care Topical Primary Dressing NAILYN, DEARINGER ([id number]) Secondary Dressing Secured With Compression Wrap Compression Stockings Add-Ons Electronic  Signature(s) Signed: 05/13/2021 4:49:51 PM By: Carlene Coria RN Entered By: Carlene Coria on 05/13/2021 09:19:53 [name] ([id number]) -------------------------------------------------------------------------------- Vitals Details Patient Name: [name] Date of Service: 05/13/2021 8:45 AM Medical Record Number: [id number] Patient Account Number: [id number] Date of Birth/Sex: 1928/12/30 (87 y.o. F) Treating RN: Carlene Coria Primary Care Glade Strausser: Clayborn Bigness Other Clinician: Referring Ante Arredondo: Referral, Self Treating Cedrick Partain/Extender: Skipper Cliche in Treatment: 0 Vital Signs Time Taken: 09:00 Temperature (F): 98 Height (in): 60 Pulse (bpm): 68 Source: Stated Respiratory Rate (breaths/min): 18 Weight (lbs): 99 Blood Pressure (mmHg): 174/81 Source: Stated Reference Range: 80 - 120 mg / dl Body Mass Index (BMI): 19.3 Electronic Signature(s) Signed: 05/13/2021 4:49:51 PM By: Carlene Coria RN Entered By: Carlene Coria on 05/13/2021 09:03:03
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
## 2021-05-31 ENCOUNTER — Ambulatory Visit: Payer: Medicare Other | Admitting: Physician Assistant

## 2021-06-02 ENCOUNTER — Other Ambulatory Visit: Payer: Self-pay | Admitting: Internal Medicine

## 2021-06-02 DIAGNOSIS — I10 Essential (primary) hypertension: Secondary | ICD-10-CM

## 2021-06-06 ENCOUNTER — Ambulatory Visit: Payer: Medicare Other | Admitting: Nurse Practitioner

## 2021-06-06 ENCOUNTER — Other Ambulatory Visit: Payer: Self-pay

## 2021-06-06 ENCOUNTER — Encounter: Payer: Self-pay | Admitting: Nurse Practitioner

## 2021-06-06 VITALS — BP 184/80 | HR 70 | Temp 98.4°F | Resp 16 | Ht 60.0 in | Wt 102.8 lb

## 2021-06-06 DIAGNOSIS — I10 Essential (primary) hypertension: Secondary | ICD-10-CM

## 2021-06-06 MED ORDER — HYDRALAZINE HCL 50 MG PO TABS: 50.0000 mg | ORAL_TABLET | Freq: Three times a day (TID) | ORAL | 2 refills | Status: DC

## 2021-06-06 NOTE — Progress Notes (Signed)
[facility] [place], [place] [address]  Internal MEDICINE  Office Visit Note  Patient Name: [name]  [id number]  [id number]  Date of Service: 06/06/2021  Chief Complaint  Patient presents with   Follow-up    Discuss meds    HPI [name] presents for a follow up visit for hypertension and medication review. She sees Dr. Rockey [name] who manages her BP medications. Dr. Rockey [name] stopped her amlodipine. The patient stopped her clonidine by herself. Her blood pressure is severely elevated today even when rechecked, see vitals. Medication reviewed with patient. Will need medication adjustment due to significantly elevated blood pressure.      Current Medication: Outpatient Encounter Medications as of 06/06/2021  Medication Sig   Ascorbic Acid (VITAMIN C) 1000 MG tablet Take 1,000 mg by mouth daily.   calcium-vitamin D (OSCAL WITH D) 500-200 MG-UNIT tablet Take 1 tablet by mouth daily with breakfast.   cholecalciferol (VITAMIN D) 1000 units tablet Take 1,000 Units by mouth daily.   clonazePAM (KLONOPIN) 1 MG tablet Take half to one tab po qhs prn for insomnia   gabapentin (NEURONTIN) 100 MG capsule Take 1 tab po in am,1 tab po in afternoon and 2 tab at bedtime   hydrALAZINE (APRESOLINE) 50 MG tablet Take 1 tablet (50 mg total) by mouth 3 (three) times daily.   irbesartan (AVAPRO) 300 MG tablet TAKE 1/2 TABLET (150 MG TOTAL) BY MOUTH IN THE MORNING AND 1/2 TABLET AT BEDTIME.   labetalol (NORMODYNE) 300 MG tablet Take 1 tablet (300 mg total) by mouth 2 (two) times daily.   montelukast (SINGULAIR) 10 MG tablet Take 1 tablet (10 mg total) by mouth daily.   Polyethyl Glycol-Propyl Glycol (SYSTANE OP) Apply to eye daily.   predniSONE (DELTASONE) 5 MG tablet TAKE 1 TABLET BY MOUTH EVERY DAY WITH BREAKFAST   vitamin B-12 (CYANOCOBALAMIN) 1000 MCG tablet Take 1,000 mcg by mouth daily.   [DISCONTINUED] ELIQUIS 2.5 MG TABS tablet TAKE 1 TABLET BY MOUTH TWICE A DAY    [DISCONTINUED] hydrALAZINE (APRESOLINE) 25 MG tablet TAKE 1 TABLET BY MOUTH THREE TIMES A DAY   No facility-administered encounter medications on file as of 06/06/2021.    Surgical History: Past Surgical History:  Procedure Laterality Date   ABDOMINAL HYSTERECTOMY     CATARACT EXTRACTION W/PHACO Right 07/01/2019   Procedure: CATARACT EXTRACTION PHACO AND INTRAOCULAR LENS PLACEMENT (IOC) RIGHT 9.25 01:14.7 ;  Surgeon: Ashley Robson, MD;  Location: [facility];  Service: Ophthalmology;  Laterality: Right;   CATARACT EXTRACTION W/PHACO Left 09/09/2019   Procedure: CATARACT EXTRACTION PHACO AND INTRAOCULAR LENS PLACEMENT (IOC) LEFT 8.29  01:03.3;  Surgeon: Ashley Robson, MD;  Location: [facility];  Service: Ophthalmology;  Laterality: Left;   COLONOSCOPY     RENAL ARTERY STENT     TONSILLECTOMY     TOOTH EXTRACTION      Medical History: Past Medical History:  Diagnosis Date   A-fib ([place])    Asthma    in past   Heart murmur    Pt states she has Heart Murmur   Hypertension    Lower leg injury 04/24/2016   08/06/17- pt reports legs still not completely healed   Renal insufficiency    10%-Right, 90%-Left (per pt)   Skin cancer     Family History: Family History  Problem Relation Age of Onset   Hypertension Mother    Cancer Sister    Cancer Niece    Cancer Nephew  Social History   Socioeconomic History   Marital status: Widowed    Spouse name: Not on file   Number of children: Not on file   Years of education: Not on file   Highest education level: Not on file  Occupational History   Not on file  Tobacco Use   Smoking status: Never   Smokeless tobacco: Never  Vaping Use   Vaping Use: Never used  Substance and Sexual Activity   Alcohol use: No   Drug use: No   Sexual activity: Not on file  Other Topics Concern   Not on file  Social History Narrative   Not on file   Social Determinants of Health   Financial Resource Strain: Not on  file  Food Insecurity: Not on file  Transportation Needs: Not on file  Physical Activity: Not on file  Stress: Not on file  Social Connections: Not on file  Intimate Partner Violence: Not on file      Review of Systems  Constitutional:  Negative for chills, fatigue and unexpected weight change.  HENT:  Negative for congestion, postnasal drip, rhinorrhea, sneezing and sore throat.   Eyes:  Negative for redness.  Respiratory:  Negative for cough, chest tightness and shortness of breath.   Cardiovascular:  Negative for chest pain and palpitations.  Gastrointestinal:  Negative for abdominal pain, constipation, diarrhea, nausea and vomiting.  Genitourinary:  Negative for dysuria and frequency.  Musculoskeletal:  Negative for arthralgias, back pain, joint swelling and neck pain.  Skin:  Negative for rash.  Neurological: Negative.  Negative for tremors and numbness.  Hematological:  Negative for adenopathy. Does not bruise/bleed easily.  Psychiatric/Behavioral:  Negative for behavioral problems (Depression), sleep disturbance and suicidal ideas. The patient is not nervous/anxious.    Vital Signs: BP (!) 184/80    Pulse 70    Temp 98.4 F (36.9 C)    Resp 16    Ht 5' (1.524 m)    Wt 102 lb 12.8 oz (46.6 kg)    SpO2 99%    BMI 20.08 kg/m    Physical Exam Vitals reviewed.  Constitutional:      Appearance: Normal appearance.  HENT:     Head: Normocephalic and atraumatic.     Nose: Nose normal.     Mouth/Throat:     Mouth: Mucous membranes are moist.     Pharynx: No posterior oropharyngeal erythema.  Eyes:     Extraocular Movements: Extraocular movements intact.     Pupils: Pupils are equal, round, and reactive to light.  Cardiovascular:     Pulses: Normal pulses.     Heart sounds: Normal heart sounds.  Pulmonary:     Effort: Pulmonary effort is normal.     Breath sounds: Normal breath sounds.  Neurological:     General: No focal deficit present.     Mental Status: She is  alert.  Psychiatric:        Mood and Affect: Mood normal.        Behavior: Behavior normal.       Assessment/Plan: 1. Systolic hypertension, isolated Hydralazine dose increased, she will take 2 tablets of the 25 mg dose for each time she is supposed to take it until she runs out of the 25 mg tablets. Follow up in 1 month to reassess blood pressure.  - hydrALAZINE (APRESOLINE) 50 MG tablet; Take 1 tablet (50 mg total) by mouth 3 (three) times daily.  Dispense: 90 tablet; Refill: 2   General Counseling: [name]  verbalizes understanding of the findings of todays visit and agrees with plan of treatment. I have discussed any further diagnostic evaluation that may be needed or ordered today. We also reviewed her medications today. she has been encouraged to call the office with any questions or concerns that should arise related to todays visit.    No orders of the defined types were placed in this encounter.   Meds ordered this encounter  Medications   hydrALAZINE (APRESOLINE) 50 MG tablet    Sig: Take 1 tablet (50 mg total) by mouth 3 (three) times daily.    Dispense:  90 tablet    Refill:  2    Patient will call when she needs to fill the prescription, she has a lot of 25 mg tablets and will take 2 tablets for each dose until she runs out of those.    Return in about 1 month (around 07/07/2021) for F/U, BP check, [name] PCP.   Total time spent:30 Minutes Time spent includes review of chart, medications, test results, and follow up plan with the patient.   [place] Controlled Substance Database was reviewed by me.  This patient was seen by Jonetta Osgood, FNP-C in collaboration with Dr. Clayborn Bigness as a part of collaborative care agreement.   Ashley Dame R. Valetta Fuller, MSN, FNP-C Internal medicine

## 2021-06-06 NOTE — Patient Instructions (Addendum)
Hydralazine dose changed: take 50 mg by mouth three times daily.   If you have 25 mg tablets left at home, you may take 2 25mg -tablets three times daily until you run out of the 25 mg tablets.  Once you run out of the 25 mg tablets, start taking 1 tablet 3 times daily of the hydralazine 50 mg tablet.

## 2021-07-06 ENCOUNTER — Other Ambulatory Visit: Payer: Self-pay | Admitting: Cardiovascular Disease

## 2021-07-06 NOTE — Telephone Encounter (Signed)
Prescription refill request for Eliquis received. Indication: PAF Last office visit: 01/18/21  Johnny Bridge MD Scr: 0.92 on 01/07/21 Age: 88 Weight: 44.9kg  Based on above findings Eliquis 2.5mg  twice daily is the appropriate dose.  Refill approved.

## 2021-07-09 ENCOUNTER — Other Ambulatory Visit: Payer: Self-pay | Admitting: Internal Medicine

## 2021-07-09 DIAGNOSIS — I10 Essential (primary) hypertension: Secondary | ICD-10-CM

## 2021-07-11 ENCOUNTER — Other Ambulatory Visit: Payer: Self-pay

## 2021-07-11 ENCOUNTER — Emergency Department: Payer: Medicare Other

## 2021-07-11 ENCOUNTER — Ambulatory Visit: Payer: Medicare Other | Admitting: Nurse Practitioner

## 2021-07-11 ENCOUNTER — Telehealth: Payer: Self-pay

## 2021-07-11 ENCOUNTER — Emergency Department
Admission: EM | Admit: 2021-07-11 | Discharge: 2021-07-11 | Disposition: A | Discharge status: Home / Self Care | Payer: Medicare Other | Attending: Emergency Medicine | Admitting: Emergency Medicine

## 2021-07-11 DIAGNOSIS — M25551 Pain in right hip: Secondary | ICD-10-CM | POA: Diagnosis not present

## 2021-07-11 DIAGNOSIS — S0003XA Contusion of scalp, initial encounter: Secondary | ICD-10-CM | POA: Diagnosis not present

## 2021-07-11 DIAGNOSIS — Z85828 Personal history of other malignant neoplasm of skin: Secondary | ICD-10-CM | POA: Insufficient documentation

## 2021-07-11 DIAGNOSIS — Z7901 Long term (current) use of anticoagulants: Secondary | ICD-10-CM | POA: Insufficient documentation

## 2021-07-11 DIAGNOSIS — S0990XA Unspecified injury of head, initial encounter: Secondary | ICD-10-CM | POA: Diagnosis present

## 2021-07-11 DIAGNOSIS — N189 Chronic kidney disease, unspecified: Secondary | ICD-10-CM | POA: Insufficient documentation

## 2021-07-11 DIAGNOSIS — I129 Hypertensive chronic kidney disease with stage 1 through stage 4 chronic kidney disease, or unspecified chronic kidney disease: Secondary | ICD-10-CM | POA: Diagnosis not present

## 2021-07-11 DIAGNOSIS — J45909 Unspecified asthma, uncomplicated: Secondary | ICD-10-CM | POA: Insufficient documentation

## 2021-07-11 DIAGNOSIS — W01198A Fall on same level from slipping, tripping and stumbling with subsequent striking against other object, initial encounter: Secondary | ICD-10-CM | POA: Diagnosis not present

## 2021-07-11 DIAGNOSIS — W19XXXA Unspecified fall, initial encounter: Secondary | ICD-10-CM

## 2021-07-11 MED ORDER — LABETALOL HCL 200 MG PO TABS
200.0000 mg | ORAL_TABLET | Freq: Once | ORAL | Status: AC
  Administered 2021-07-11: 200 mg via ORAL
  Filled 2021-07-11: qty 1

## 2021-07-11 MED ORDER — HYDRALAZINE HCL 50 MG PO TABS
50.0000 mg | ORAL_TABLET | Freq: Once | ORAL | Status: AC
  Administered 2021-07-11: 50 mg via ORAL
  Filled 2021-07-11: qty 1

## 2021-07-11 MED ORDER — ACETAMINOPHEN 500 MG PO TABS
1000.0000 mg | ORAL_TABLET | Freq: Once | ORAL | Status: AC
  Administered 2021-07-11: 1000 mg via ORAL
  Filled 2021-07-11: qty 2

## 2021-07-11 MED ORDER — IRBESARTAN 150 MG PO TABS
300.0000 mg | ORAL_TABLET | Freq: Once | ORAL | Status: AC
  Administered 2021-07-11: 300 mg via ORAL
  Filled 2021-07-11: qty 2

## 2021-07-11 NOTE — Telephone Encounter (Signed)
Lvm to schedule ED follow up-Toni 

## 2021-07-11 NOTE — ED Provider Notes (Signed)
[facility] Provider Note    Event Date/Time   First MD Initiated Contact with Patient 07/11/21 [phone]     (approximate)   History   Fall   HPI  [name] is a 88 y.o. female with history of A-fib on Eliquis, chronic kidney disease, hypertension who presents for evaluation after mechanical fall.  Patient reports that she was standing on the kitchen over the sink having some ice cream when her slippers got caught on the floor of the kitchen and she fell.  She hit the back of her head on the floor.  She is complaining of right hip pain and a headache.  She denies LOC.  She reports that the fall was mechanical in nature.  She was able to access Alexa from the floor and call her daughter who then called 911.  She denies neck pain or back pain, chest pain or abdominal pain.  Patient reports that she was in her usual state of health at this evening.  She lives alone.     Past Medical History:  Diagnosis Date   A-fib ([place])    Asthma    in past   Heart murmur    Pt states she has Heart Murmur   Hypertension    Lower leg injury 04/24/2016   08/06/17- pt reports legs still not completely healed   Renal insufficiency    10%-Right, 90%-Left (per pt)   Skin cancer     Past Surgical History:  Procedure Laterality Date   ABDOMINAL HYSTERECTOMY     CATARACT EXTRACTION W/PHACO Right 07/01/2019   Procedure: CATARACT EXTRACTION PHACO AND INTRAOCULAR LENS PLACEMENT (IOC) RIGHT 9.25 01:14.7 ;  Surgeon: Birder Robson, MD;  Location: [facility];  Service: Ophthalmology;  Laterality: Right;   CATARACT EXTRACTION W/PHACO Left 09/09/2019   Procedure: CATARACT EXTRACTION PHACO AND INTRAOCULAR LENS PLACEMENT (IOC) LEFT 8.29  01:03.3;  Surgeon: Birder Robson, MD;  Location: [facility];  Service: Ophthalmology;  Laterality: Left;   COLONOSCOPY     RENAL ARTERY STENT     TONSILLECTOMY     TOOTH EXTRACTION       Physical Exam   Triage Vital  Signs: ED Triage Vitals  Enc Vitals Group     BP 07/11/21 0057 (!) 173/94     Pulse Rate 07/11/21 0057 78     Resp 07/11/21 0057 18     Temp 07/11/21 0057 98.1 F (36.7 C)     Temp Source 07/11/21 0057 Oral     SpO2 07/11/21 0057 100 %     Weight 07/11/21 0058 102 lb 11.8 oz (46.6 kg)     Height 07/11/21 0058 5' (1.524 m)     Head Circumference --      Peak Flow --      Pain Score 07/11/21 0058 6     Pain Loc --      Pain Edu? --      Excl. in [place]? --     Most recent vital signs: Vitals:   07/11/21 0320 07/11/21 0330  BP: (!) 193/71 (!) 182/67  Pulse: 77 71  Resp: 16 18  Temp:    SpO2: 98% 98%    Full spinal precautions maintained throughout the trauma exam. Constitutional: Alert and oriented. No acute distress. Does not appear intoxicated. HEENT Head: Normocephalic with occipital hematoma Face: No facial bony tenderness. Stable midface Ears: No hemotympanum bilaterally. No Battle sign Eyes: No eye injury. PERRL. No raccoon eyes Nose: Nontender.  No epistaxis. No rhinorrhea Mouth/Throat: Mucous membranes are moist. No oropharyngeal blood. No dental injury. Airway patent without stridor. Normal voice. Neck: no C-collar. No midline c-spine tenderness.  Cardiovascular: Normal rate, regular rhythm. Normal and symmetric distal pulses are present in all extremities. Pulmonary/Chest: Chest wall is stable and nontender to palpation/compression. Normal respiratory effort. Breath sounds are normal. No crepitus.  Abdominal: Soft, nontender, non distended. Musculoskeletal: tender to palpation of the R hip with no deformity or bruising. Nontender with normal full range of motion in all other extremities. No deformities. No thoracic or lumbar midline spinal tenderness. Pelvis is stable. Skin: Skin is warm, dry and intact. No abrasions or contutions. Psychiatric: Speech and behavior are appropriate. Neurological: Normal speech and language. Moves all extremities to command. No gross  focal neurologic deficits are appreciated.  Glascow Coma Score: 4 - Opens eyes on own 6 - Follows simple motor commands 5 - Alert and oriented GCS: 15   ED Results / Procedures / Treatments   Labs (all labs ordered are listed, but only abnormal results are displayed) Labs Reviewed - No data to display   EKG  ED ECG REPORT I, Rudene Re, the attending physician, personally viewed and interpreted this ECG.  Sinus rhythm with a rate of 75, normal intervals, normal axis, no ST elevations or depressions  RADIOLOGY I, Rudene Re, attending MD, have personally viewed and interpreted the images obtained during this visit as below:  Was x-ray with no signs of fracture, CT head and cervical spine with no traumatic and   ___________________________________________________ Interpretation by Radiologist:  CT Head Wo Contrast  Result Date: 07/11/2021 CLINICAL DATA:  Head trauma. EXAM: CT HEAD WITHOUT CONTRAST CT CERVICAL SPINE WITHOUT CONTRAST TECHNIQUE: Multidetector CT imaging of the head and cervical spine was performed following the standard protocol without intravenous contrast. Multiplanar CT image reconstructions of the cervical spine were also generated. RADIATION DOSE REDUCTION: This exam was performed according to the departmental dose-optimization program which includes automated exposure control, adjustment of the mA and/or kV according to patient size and/or use of iterative reconstruction technique. COMPARISON:  Head CT dated 01/07/2021. FINDINGS: CT HEAD FINDINGS Brain: Mild age-related atrophy and chronic microvascular ischemic changes. 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 There is no acute intracranial hemorrhage. No mass effect or midline shift. No extra-axial fluid collection. Vascular: No hyperdense vessel or unexpected calcification. Skull: Normal. Negative for fracture or focal lesion. Sinuses/Orbits: Mild mucoperiosteal thickening of  paranasal sinuses. No air-fluid level the mastoid air cells are clear. Other: None CT CERVICAL SPINE FINDINGS Alignment: There is reversal of normal cervical lordosis which may be positional or due to muscle spasm. No acute subluxation. Skull base and vertebrae: No acute fracture.  Osteopenia. Soft tissues and spinal canal: No prevertebral fluid or swelling. No visible canal hematoma. Disc levels: Multilevel degenerative changes with disc space narrowing and endplate irregularity/spurring and osteophyte. Upper chest: No acute findings. Other: Bilateral carotid bulb calcified plaques. IMPRESSION: 1. No acute intracranial pathology. Mild age-related atrophy and chronic microvascular ischemic changes. 2. No acute cervical spine fracture or subluxation. Multilevel degenerative changes. Electronically Signed   By: Anner Crete M.D.   On: 07/11/2021 01:48   CT Cervical Spine Wo Contrast  Result Date: 07/11/2021 CLINICAL DATA:  Head trauma. EXAM: CT HEAD WITHOUT CONTRAST CT CERVICAL SPINE WITHOUT CONTRAST TECHNIQUE: Multidetector CT imaging of the head and cervical spine was performed following the standard protocol without intravenous contrast. Multiplanar CT image reconstructions of the cervical spine  were also generated. RADIATION DOSE REDUCTION: This exam was performed according to the departmental dose-optimization program which includes automated exposure control, adjustment of the mA and/or kV according to patient size and/or use of iterative reconstruction technique. COMPARISON:  Head CT dated 01/07/2021. FINDINGS: CT HEAD FINDINGS Brain: Mild age-related atrophy and chronic microvascular ischemic changes. 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 There is no acute intracranial hemorrhage. No mass effect or midline shift. No extra-axial fluid collection. Vascular: No hyperdense vessel or unexpected calcification. Skull: Normal. Negative for fracture or focal lesion. Sinuses/Orbits: Mild  mucoperiosteal thickening of paranasal sinuses. No air-fluid level the mastoid air cells are clear. Other: None CT CERVICAL SPINE FINDINGS Alignment: There is reversal of normal cervical lordosis which may be positional or due to muscle spasm. No acute subluxation. Skull base and vertebrae: No acute fracture.  Osteopenia. Soft tissues and spinal canal: No prevertebral fluid or swelling. No visible canal hematoma. Disc levels: Multilevel degenerative changes with disc space narrowing and endplate irregularity/spurring and osteophyte. Upper chest: No acute findings. Other: Bilateral carotid bulb calcified plaques. IMPRESSION: 1. No acute intracranial pathology. Mild age-related atrophy and chronic microvascular ischemic changes. 2. No acute cervical spine fracture or subluxation. Multilevel degenerative changes. Electronically Signed   By: Anner Crete M.D.   On: 07/11/2021 01:48   DG Hip Unilat W or Wo Pelvis 2-3 Views Right  Result Date: 07/11/2021 CLINICAL DATA:  Fall and right hip pain. EXAM: DG HIP (WITH OR WITHOUT PELVIS) 2-3V RIGHT COMPARISON:  None. FINDINGS: There is no acute fracture or dislocation. The bones are osteopenic. Moderate bilateral hip arthritic changes. The soft tissues are unremarkable. IMPRESSION: No acute fracture or dislocation. Electronically Signed   By: Anner Crete M.D.   On: 07/11/2021 01:57      PROCEDURES:  Critical Care performed: No  Procedures    IMPRESSION / MDM / ASSESSMENT AND PLAN / ED COURSE  I reviewed the triage vital signs and the nursing notes.  88 y.o. female with history of A-fib on Eliquis, chronic kidney disease, hypertension who presents for evaluation after mechanical fall.  Patient arrives after mechanical fall from home with a occipital hematoma and complaining of right hip pain.  She is on blood thinners.  No other signs of trauma based on history and physical exam.  Patient is otherwise well-appearing, alert and oriented x3 with a GCS  of 15.  Ddx: Mechanical fall Will eval for any signs of traumatic injury including hip fracture, cervical spine fracture, pelvic fracture, or intracranial bleed   Plan: CT head, CT cervical spine, pelvic x-ray with right hip x-ray.  Will give 1000 mg of Tylenol for pain   MEDICATIONS GIVEN IN ED: Medications  acetaminophen (TYLENOL) tablet 1,000 mg (1,000 mg Oral Given 07/11/21 0206)  hydrALAZINE (APRESOLINE) tablet 50 mg (50 mg Oral Given 07/11/21 0254)  irbesartan (AVAPRO) tablet 300 mg (300 mg Oral Given 07/11/21 0255)  labetalol (NORMODYNE) tablet 200 mg (200 mg Oral Given 07/11/21 0255)     ED COURSE: Imaging reviewed by me with no signs of traumatic injury.  Patient able to ambulate without significant pain or assistance.  Her blood pressure was very elevated.  Review of her EMR shows the patient's pressure is usually pretty high.  She is noncompliant with her blood pressure medications.  Has not taking her evening doses.  I did give her evening dose here and the pressure improved significantly.  She is completely asymptomatic.  I had a discussion with  her and her daughter and son-in-law the importance of having her blood pressure under control especially since she is on Eliquis and therefore at higher risk of having an intracranial bleed.  They understand the importance of being compliant with her pills.  Discussed close follow-up with primary care doctor.  No indication for admission at this time with patient asymptomatic from elevated blood pressure and no signs of traumatic injury.  She will be discharged to the care of her family who is at bedside.   Consults: none   EMR reviewed including last visit with her primary care doctor from 5 weeks ago where she was seen for hypertension    FINAL CLINICAL IMPRESSION(S) / ED DIAGNOSES   Final diagnoses:  Fall, initial encounter  Hematoma of scalp, initial encounter     Rx / DC Orders   ED Discharge Orders     None         Note:  This document was prepared using Dragon voice recognition software and may include unintentional dictation errors.   Please note:  Patient was evaluated in Emergency Department today for the symptoms described in the history of present illness. Patient was evaluated in the context of the global COVID-19 pandemic, which necessitated consideration that the patient might be at risk for infection with the SARS-CoV-2 virus that causes COVID-19. Institutional protocols and algorithms that pertain to the evaluation of patients at risk for COVID-19 are in a state of rapid change based on information released by regulatory bodies including the CDC and federal and state organizations. These policies and algorithms were followed during the patient's care in the ED.  Some ED evaluations and interventions may be delayed as a result of limited staffing during the pandemic.       Alfred Levins, [place], MD 07/11/21 [phone]

## 2021-07-11 NOTE — ED Triage Notes (Signed)
Pt to ED by EMS from home c/o mechanical fall, no LOC. Pt endorses slipping and falling hitting her head. Pt also s/o bilateral hip pain, no deformity or rotation noted. Arrives A+O, Hypertensive, all other VSS, NADN.

## 2021-07-11 NOTE — Discharge Instructions (Signed)
You were seen in the emergency department after a fall. Luckily all of your imaging studies did not show any evidence of injuries. Follow-up with you doctor within the next 2-3 days for further evaluation. Sometimes injuries can present at a later time and therefore it is imperative that you return to the emergency room if you have a severe headache, facial droop, neck pain, numbness or weakness of your extremities, slurred speech, difficulty finding words, chest pain, back pain, abdominal pain, or any other new symptoms that were not present during this visit. You may take Tylenol at home for your pain.  Your blood pressure does not seem to be well controlled. This is very dangerous especially with you being on a blood thinner as it may increase your risk of brain bleed. Make sure to take your medications as prescribed and have a close follow up with your doctor in 2-3 days for further management.

## 2021-07-11 NOTE — ED Notes (Signed)
AVS provided to and discussed with patient and family member at bedside. Pt verbalizes understanding of discharge instructions and denies any questions or concerns at this time. Pt has ride home. Pt taken out of department via W/C.   

## 2021-07-13 ENCOUNTER — Telehealth: Payer: Medicare Other | Admitting: Nurse Practitioner

## 2021-07-14 ENCOUNTER — Other Ambulatory Visit: Payer: Self-pay

## 2021-07-14 ENCOUNTER — Ambulatory Visit: Payer: Medicare Other | Admitting: Nurse Practitioner

## 2021-07-14 ENCOUNTER — Encounter: Payer: Self-pay | Admitting: Nurse Practitioner

## 2021-07-14 ENCOUNTER — Telehealth: Payer: Medicare Other | Admitting: Nurse Practitioner

## 2021-07-14 VITALS — BP 200/80 | HR 65 | Temp 98.5°F | Resp 16 | Ht 60.0 in | Wt 100.4 lb

## 2021-07-14 DIAGNOSIS — Z9181 History of falling: Secondary | ICD-10-CM

## 2021-07-14 DIAGNOSIS — M542 Cervicalgia: Secondary | ICD-10-CM

## 2021-07-14 DIAGNOSIS — I10 Essential (primary) hypertension: Secondary | ICD-10-CM

## 2021-07-14 DIAGNOSIS — M5441 Lumbago with sciatica, right side: Secondary | ICD-10-CM | POA: Diagnosis not present

## 2021-07-14 DIAGNOSIS — G8929 Other chronic pain: Secondary | ICD-10-CM

## 2021-07-14 DIAGNOSIS — M5442 Lumbago with sciatica, left side: Secondary | ICD-10-CM

## 2021-07-14 NOTE — Progress Notes (Unsigned)
[facility] [place], [place] [address]  Internal MEDICINE  Office Visit Note  Patient Name: [name]  [id number]  [id number]  Date of Service: 07/14/2021  Chief Complaint  Patient presents with   Follow-up    Fall on Sunday evening    Hypertension   Asthma    Hypertension Pertinent negatives include no chest pain, neck pain, palpitations or shortness of breath.  Asthma There is no cough or shortness of breath. Pertinent negatives include no chest pain, rhinorrhea, sneezing or sore throat. Her past medical history is significant for asthma.  [name] presents for a follow-up visit for  Left over tramadol, will break in half  200/80    Current Medication: Outpatient Encounter Medications as of 07/14/2021  Medication Sig   Ascorbic Acid (VITAMIN C) 1000 MG tablet Take 1,000 mg by mouth daily.   calcium-vitamin D (OSCAL WITH D) 500-200 MG-UNIT tablet Take 1 tablet by mouth daily with breakfast.   cholecalciferol (VITAMIN D) 1000 units tablet Take 1,000 Units by mouth daily.   clonazePAM (KLONOPIN) 1 MG tablet Take half to one tab po qhs prn for insomnia   ELIQUIS 2.5 MG TABS tablet TAKE 1 TABLET BY MOUTH TWICE A DAY   gabapentin (NEURONTIN) 100 MG capsule Take 1 tab po in am,1 tab po in afternoon and 2 tab at bedtime   hydrALAZINE (APRESOLINE) 50 MG tablet Take 1 tablet (50 mg total) by mouth 3 (three) times daily.   irbesartan (AVAPRO) 300 MG tablet TAKE 1/2 TABLET (150 MG TOTAL) BY MOUTH IN THE MORNING AND 1/2 TABLET AT BEDTIME.   labetalol (NORMODYNE) 200 MG tablet TAKE 1 TABLET BY MOUTH TWICE A DAY   montelukast (SINGULAIR) 10 MG tablet Take 1 tablet (10 mg total) by mouth daily.   Polyethyl Glycol-Propyl Glycol (SYSTANE OP) Apply to eye daily.   predniSONE (DELTASONE) 5 MG tablet TAKE 1 TABLET BY MOUTH EVERY DAY WITH BREAKFAST   vitamin B-12 (CYANOCOBALAMIN) 1000 MCG tablet Take 1,000 mcg by mouth daily.   No facility-administered  encounter medications on file as of 07/14/2021.    Surgical History: Past Surgical History:  Procedure Laterality Date   ABDOMINAL HYSTERECTOMY     CATARACT EXTRACTION W/PHACO Right 07/01/2019   Procedure: CATARACT EXTRACTION PHACO AND INTRAOCULAR LENS PLACEMENT (IOC) RIGHT 9.25 01:14.7 ;  Surgeon: Birder Robson, MD;  Location: [facility];  Service: Ophthalmology;  Laterality: Right;   CATARACT EXTRACTION W/PHACO Left 09/09/2019   Procedure: CATARACT EXTRACTION PHACO AND INTRAOCULAR LENS PLACEMENT (IOC) LEFT 8.29  01:03.3;  Surgeon: Birder Robson, MD;  Location: [facility];  Service: Ophthalmology;  Laterality: Left;   COLONOSCOPY     RENAL ARTERY STENT     TONSILLECTOMY     TOOTH EXTRACTION      Medical History: Past Medical History:  Diagnosis Date   A-fib ([place])    Asthma    in past   Heart murmur    Pt states she has Heart Murmur   Hypertension    Lower leg injury 04/24/2016   08/06/17- pt reports legs still not completely healed   Renal insufficiency    10%-Right, 90%-Left (per pt)   Skin cancer     Family History: Family History  Problem Relation Age of Onset   Hypertension Mother    Cancer Sister    Cancer Niece    Cancer Nephew     Social History   Socioeconomic History   Marital status: Widowed  Spouse name: Not on file   Number of children: Not on file   Years of education: Not on file   Highest education level: Not on file  Occupational History   Not on file  Tobacco Use   Smoking status: Never   Smokeless tobacco: Never  Vaping Use   Vaping Use: Never used  Substance and Sexual Activity   Alcohol use: No   Drug use: No   Sexual activity: Not on file  Other Topics Concern   Not on file  Social History Narrative   Not on file   Social Determinants of Health   Financial Resource Strain: Not on file  Food Insecurity: Not on file  Transportation Needs: Not on file  Physical Activity: Not on file  Stress: Not on  file  Social Connections: Not on file  Intimate Partner Violence: Not on file      Review of Systems  Constitutional:  Negative for chills, fatigue and unexpected weight change.  HENT:  Negative for congestion, rhinorrhea, sneezing and sore throat.   Eyes:  Negative for redness.  Respiratory:  Negative for cough, chest tightness and shortness of breath.   Cardiovascular:  Negative for chest pain and palpitations.  Gastrointestinal:  Negative for abdominal pain, constipation, diarrhea, nausea and vomiting.  Genitourinary:  Negative for dysuria and frequency.  Musculoskeletal:  Negative for arthralgias, back pain, joint swelling and neck pain.       [address] and went to ER has bruising on hip, head, arms, and legs  Skin:  Negative for rash.  Neurological: Negative.  Negative for tremors and numbness.  Hematological:  Negative for adenopathy. Bruises/bleeds easily.  Psychiatric/Behavioral:  Negative for behavioral problems (Depression), sleep disturbance and suicidal ideas. The patient is not nervous/anxious.    Vital Signs: Pulse 65    Temp 98.5 F (36.9 C)    Resp 16    Ht 5' (1.524 m)    Wt 100 lb 6.4 oz (45.5 kg)    SpO2 98%    BMI 19.61 kg/m    Physical Exam     Assessment/Plan:   General Counseling: [name] verbalizes understanding of the findings of todays visit and agrees with plan of treatment. I have discussed any further diagnostic evaluation that may be needed or ordered today. We also reviewed her medications today. she has been encouraged to call the office with any questions or concerns that should arise related to todays visit.    No orders of the defined types were placed in this encounter.   No orders of the defined types were placed in this encounter.   Return in about 1 month (around 08/11/2021) for F/U, BP check, [name] PCP.   Total time spent:*** Minutes Time spent includes review of chart, medications, test results, and follow up plan with the  patient.   [place] Controlled Substance Database was reviewed by me.  This patient was seen by Jonetta Osgood, FNP-C in collaboration with Dr. Clayborn Bigness as a part of collaborative care agreement.   [name] R. Valetta Fuller, MSN, FNP-C Internal medicine

## 2021-08-10 ENCOUNTER — Ambulatory Visit: Payer: Medicare Other | Admitting: Nurse Practitioner

## 2021-08-21 ENCOUNTER — Encounter: Payer: Self-pay | Admitting: Nurse Practitioner

## 2021-09-09 ENCOUNTER — Other Ambulatory Visit: Payer: Self-pay | Admitting: Nurse Practitioner

## 2021-09-09 DIAGNOSIS — M419 Scoliosis, unspecified: Secondary | ICD-10-CM

## 2021-09-09 DIAGNOSIS — M5000 Cervical disc disorder with myelopathy, unspecified cervical region: Secondary | ICD-10-CM

## 2021-09-10 ENCOUNTER — Encounter: Payer: Self-pay | Admitting: Emergency Medicine

## 2021-09-10 ENCOUNTER — Ambulatory Visit
Admission: EM | Admit: 2021-09-10 | Discharge: 2021-09-10 | Disposition: A | Discharge status: Home / Self Care | Payer: Medicare Other

## 2021-09-10 DIAGNOSIS — W19XXXA Unspecified fall, initial encounter: Secondary | ICD-10-CM | POA: Diagnosis not present

## 2021-09-10 DIAGNOSIS — S41111A Laceration without foreign body of right upper arm, initial encounter: Secondary | ICD-10-CM

## 2021-09-10 NOTE — ED Provider Notes (Signed)
?[facility] ? ? ? ?CSN: [id number] ?Arrival date & time: 09/10/21  1413 ? ? ?  ? ?History   ?Chief Complaint ?Chief Complaint  ?Patient presents with  ? Fall  ? ? ?HPI ?[name] is a 88 y.o. female.  ? ?HPI ?Patient presents today with skin tear to the right upper arm following a fall that occurred at her house.  Patient lives alone and she is here accompanied by her daughter.  Patient fell today and sustained a tear of her right upper arm.  She reports some pain to her buttocks on the right side however reports this is something chronic that has been going on.  She is ambulatory at her baseline.  Denies hitting her head or sustaining any loss of consciousness.  She denies any recent episodes of syncope.  She denies any chest pain or shortness of breath. ? ?Past Medical History:  ?Diagnosis Date  ? A-fib ([place])   ? Asthma   ? in past  ? Heart murmur   ? Pt states she has Heart Murmur  ? Hypertension   ? Lower leg injury 04/24/2016  ? 08/06/17- pt reports legs still not completely healed  ? Renal insufficiency   ? 10%-Right, 90%-Left (per pt)  ? Skin cancer   ? ? ?Patient Active Problem List  ? Diagnosis Date Noted  ? Abnormal weight loss 07/30/2019  ? Encounter for general adult medical examination with abnormal findings 12/19/2018  ? Cellulitis and abscess of leg 12/19/2018  ? 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 Bladder prolapse, female, acquired 02/04/2018  ? Increased urinary frequency 01/16/2018  ? Right upper quadrant abdominal tenderness 01/16/2018  ? Incomplete bladder emptying 12/04/2017  ? Recurrent cellulitis of lower leg 12/04/2017  ? Hypertension, poor control 10/28/2017  ? SCC (squamous cell carcinoma), leg, left 08/02/2016  ? Aortic valve insufficiency 05/02/2016  ? Diverticulosis 05/02/2016  ? GAD (generalized anxiety disorder) 05/02/2016  ? History of gastritis 05/02/2016  ? IBS (irritable bowel syndrome) 05/02/2016  ? Peripheral vascular  disease ([place]) 05/02/2016  ? Trauma   ? Syncope 04/21/2016  ? SCC (squamous cell carcinoma), hand, right 03/08/2016  ? Heart murmur   ? Local infection of skin and subcutaneous tissue 05/15/2012  ? ? ?Past Surgical History:  ?Procedure Laterality Date  ? ABDOMINAL HYSTERECTOMY    ? CATARACT EXTRACTION W/PHACO Right 07/01/2019  ? Procedure: CATARACT EXTRACTION PHACO AND INTRAOCULAR LENS PLACEMENT (IOC) RIGHT 9.25 01:14.7 ;  Surgeon: Birder Robson, MD;  Location: [facility];  Service: Ophthalmology;  Laterality: Right;  ? CATARACT EXTRACTION W/PHACO Left 09/09/2019  ? Procedure: CATARACT EXTRACTION PHACO AND INTRAOCULAR LENS PLACEMENT (IOC) LEFT 8.29  01:03.3;  Surgeon: Birder Robson, MD;  Location: [facility];  Service: Ophthalmology;  Laterality: Left;  ? COLONOSCOPY    ? RENAL ARTERY STENT    ? TONSILLECTOMY    ? TOOTH EXTRACTION    ? ? ?OB History   ?No obstetric history on file. ?  ? ? ? ?Home Medications   ? ?Prior to Admission medications   ?Medication Sig Start Date End Date Taking? Authorizing Provider  ?labetalol (NORMODYNE) 300 MG tablet Take 1 tablet by mouth 2 (two) times daily. 04/17/21  Yes [provider]  ?pregabalin (LYRICA) 25 MG capsule Take 25 mg once a day for a week then increase to 25 mg twice a day and continue that dose 08/24/21  Yes [provider]  ?amLODipine (  NORVASC) 2.5 MG tablet Take by mouth.    [provider]  ?Ascorbic Acid (VITAMIN C) 1000 MG tablet Take 1,000 mg by mouth daily.    [provider]  ?calcium-vitamin D (OSCAL WITH D) 500-200 MG-UNIT tablet Take 1 tablet by mouth daily with breakfast.    [provider]  ?cholecalciferol (VITAMIN D) 1000 units tablet Take 1,000 Units by mouth daily.    [provider]  ?clonazePAM [place]) 1 MG tablet Take half to one tab po qhs prn for insomnia 11/25/20   [name], MD  ?ELIQUIS 2.5 MG TABS tablet TAKE 1 TABLET BY MOUTH TWICE A DAY 07/06/21   Minna Merritts, MD  ?hydrALAZINE (APRESOLINE) 50 MG tablet Take 1 tablet (50 mg total) by mouth 3 (three) times daily. 06/06/21   Jonetta Osgood, NP  ?irbesartan (AVAPRO) 300 MG tablet TAKE 1/2 TABLET (150 MG TOTAL) BY MOUTH IN THE MORNING AND 1/2 TABLET AT BEDTIME. 04/29/21   [name], MD  ?montelukast (SINGULAIR) 10 MG tablet Take 1 tablet (10 mg total) by mouth daily. 01/19/21   [name], MD  ?Polyethyl Glycol-Propyl Glycol (SYSTANE OP) Apply to eye daily.    [provider]  ?predniSONE (DELTASONE) 5 MG tablet TAKE 1 TABLET BY MOUTH EVERY DAY WITH BREAKFAST 09/09/21   Jonetta Osgood, NP  ?vitamin B-12 (CYANOCOBALAMIN) 1000 MCG tablet Take 1,000 mcg by mouth daily.    [provider]  ? ? ?Family History ?Family History  ?Problem Relation Age of Onset  ? Hypertension Mother   ? Cancer Sister   ? Cancer Niece   ? Cancer Nephew   ? ? ?Social History ?Social History  ? ?Tobacco Use  ? Smoking status: Never  ? Smokeless tobacco: Never  ?Vaping Use  ? Vaping Use: Never used  ?Substance Use Topics  ? Alcohol use: No  ? Drug use: No  ? ? ? ?Allergies   ?Penicillin g, Bacitracin, Neomycin, Penicillamine, and Penicillins ? ? ?Review of Systems ?Review of Systems ?Pertinent negatives listed in HPI  ? ?Physical Exam ?Triage Vital Signs ?ED Triage Vitals  ?Enc Vitals Group  ?   BP   ?   Pulse   ?   Resp   ?   Temp   ?   Temp src   ?   SpO2   ?   Weight   ?   Height   ?   Head Circumference   ?   Peak Flow   ?   Pain Score   ?   Pain Loc   ?   Pain Edu?   ?   Excl. in [place]?   ? ?No data found. ? ?Updated Vital Signs ?BP (!) 165/69 (BP Location: Left Arm)   Pulse 61   Temp (!) 97.5 ?F (36.4 ?C) (Oral)   Resp 18   SpO2 97%  ? ?Visual Acuity ?Right Eye Distance:   ?Left Eye Distance:   ?Bilateral Distance:   ? ?Right Eye Near:   ?Left Eye Near:    ?Bilateral Near:    ? ?Physical Exam ?Constitutional:   ?   Appearance: Normal appearance.  ?HENT:  ?   Head: Normocephalic and atraumatic.  ?Cardiovascular:   ?   Rate and Rhythm: Normal rate and regular rhythm.  ?Pulmonary:  ?   Effort: Pulmonary effort is normal.  ?Musculoskeletal:  ?   Right upper arm: Tenderness present.  ?     Arms: ? ?  Cervical back: Full passive range of motion without pain, normal range of motion and neck supple.  ?Neurological:  ?   Mental Status: She is alert.  ?Psychiatric:     ?   Attention and Perception: Attention normal.     ?   Mood and Affect: Mood normal.  ? ? ? ?UC Treatments / Results  ?Labs ?(all labs ordered are listed, but only abnormal results are displayed) ?Labs Reviewed - No data to display ? ?EKG ? ? ?Radiology ?No results found. ? ?Procedures ?Wound Care ? ?Date/Time: 09/10/2021 3:25 PM ?Performed by: Scot Jun, FNP ?Authorized by: Scot Jun, FNP  ? ?Consent:  ?  Consent obtained:  Verbal ?  Risks discussed:  Bleeding and infection ?  Alternatives discussed:  No treatment ?Universal protocol:  ?  Patient identity confirmed:  Verbally with patient ?Sedation:  ?  Sedation type:  None ?Anesthesia:  ?  Anesthesia method:  None ?Procedure details:  ?  Indications: open wounds   ?  Wound location:  Arm ?  Shoulder/arm location:  R upper arm ?Dressing:  ?  Dressing applied:  Kerlix, occlusive and Vaseline gauze ?Post-procedure details:  ?  Procedure completion:  Tolerated (including critical care time) ? ?Medications Ordered in UC ?Medications - No data to display ? ?Initial Impression / Assessment and Plan / UC Course  ?I have reviewed the triage vital signs and the nursing notes. ? ?Pertinent labs & imaging results that were available during my care of the patient were reviewed by me and considered in my medical decision making (see chart for details). ? ?  ? ?Skin tear secondary to fall ?Wound care instructions provided. ?Monitor for signs of infection. ?RTC as needed. ?Final Clinical Impressions(s) / UC Diagnoses  ? ?Final diagnoses:  ?Fall, initial encounter  ?Skin tear of right upper arm without  complication, initial encounter  ? ?Discharge Instructions   ?None ?  ? ?ED Prescriptions   ?None ?  ? ?PDMP not reviewed this encounter. ?  ?Scot Jun, FNP ?09/14/21 2202 ? ?

## 2021-09-10 NOTE — ED Triage Notes (Signed)
Pt fell making her bed today she said her slippers got caught on the floor. She has right arm pain and a skin tear on her right forearm. ?

## 2021-12-04 ENCOUNTER — Other Ambulatory Visit: Payer: Self-pay | Admitting: Internal Medicine

## 2021-12-04 DIAGNOSIS — F411 Generalized anxiety disorder: Secondary | ICD-10-CM

## 2021-12-08 NOTE — Telephone Encounter (Signed)
Med sent to pharmacy.

## 2021-12-26 ENCOUNTER — Ambulatory Visit: Payer: Medicare Other | Admitting: Physician Assistant

## 2021-12-26 ENCOUNTER — Encounter: Payer: Self-pay | Admitting: Physician Assistant

## 2021-12-26 DIAGNOSIS — I10 Essential (primary) hypertension: Secondary | ICD-10-CM | POA: Diagnosis not present

## 2021-12-26 DIAGNOSIS — S29012D Strain of muscle and tendon of back wall of thorax, subsequent encounter: Secondary | ICD-10-CM

## 2021-12-26 DIAGNOSIS — R011 Cardiac murmur, unspecified: Secondary | ICD-10-CM | POA: Diagnosis not present

## 2021-12-26 NOTE — Progress Notes (Unsigned)
[facility] [place],  [address]  Internal MEDICINE  Office Visit Note  Patient Name: [name]  [id number]  [id number]  Date of Service: 12/27/2021  Chief Complaint  Patient presents with   Follow-up    Follow up for chest and back pain     HPI Pt is here with her daughter for ED follow up from chest and back pain. -Went to ED on 12/17/21 with complaint of left sided chest and back pain which started the previous night after working around the house. She did not have any falls.  -daughter called that sat and pt told her that she was having CP throughout the night and was taking labetolol a few times. After hearing this, daughter called EMS who came that afternoon and found BP was borderline low. EMS didn't find anything on exam or EKG, but advised to go to ED to have bloodwork done as an MI couldn't be ruled out. -work up in ED was reassuring with negative troponins -Daughter reports that the patient was hanging coats/clothes above her the night prior and it is suspected that she pulled/strained something doing this as she doesn't normally do this kind of activity. She had reproducible left side back tenderness and was discharged home -she states today she is not having any CP, but back still tender on exam. -Sees neurology for headaches -Uses walker to help with balance  -Sees BP specialist (kidney and HTN specialist) -She is followed by cardiology and states they will call to schedule follow up as it is time for annual exam anyway  Current Medication: Outpatient Encounter Medications as of 12/26/2021  Medication Sig   amLODipine (NORVASC) 2.5 MG tablet Take by mouth.   Ascorbic Acid (VITAMIN C) 1000 MG tablet Take 1,000 mg by mouth daily.   calcium-vitamin D (OSCAL WITH D) 500-200 MG-UNIT tablet Take 1 tablet by mouth daily with breakfast.   cholecalciferol (VITAMIN D) 1000 units tablet Take 1,000 Units by mouth daily.   clonazePAM (KLONOPIN)  1 MG tablet TAKE 1/2 TO 1 TABLET BY MOUTH AT BEDTIME AS NEEDED FOR INSOMNIA   ELIQUIS 2.5 MG TABS tablet TAKE 1 TABLET BY MOUTH TWICE A DAY   hydrALAZINE (APRESOLINE) 50 MG tablet Take 1 tablet (50 mg total) by mouth 3 (three) times daily.   irbesartan (AVAPRO) 300 MG tablet TAKE 1/2 TABLET (150 MG TOTAL) BY MOUTH IN THE MORNING AND 1/2 TABLET AT BEDTIME.   labetalol (NORMODYNE) 300 MG tablet Take 1 tablet by mouth 2 (two) times daily.   montelukast (SINGULAIR) 10 MG tablet Take 1 tablet (10 mg total) by mouth daily.   Polyethyl Glycol-Propyl Glycol (SYSTANE OP) Apply to eye daily.   predniSONE (DELTASONE) 5 MG tablet TAKE 1 TABLET BY MOUTH EVERY DAY WITH BREAKFAST   pregabalin (LYRICA) 25 MG capsule Take 25 mg once a day for a week then increase to 25 mg twice a day and continue that dose   vitamin B-12 (CYANOCOBALAMIN) 1000 MCG tablet Take 1,000 mcg by mouth daily.   No facility-administered encounter medications on file as of 12/26/2021.    Surgical History: Past Surgical History:  Procedure Laterality Date   ABDOMINAL HYSTERECTOMY     CATARACT EXTRACTION W/PHACO Right 07/01/2019   Procedure: CATARACT EXTRACTION PHACO AND INTRAOCULAR LENS PLACEMENT (IOC) RIGHT 9.25 01:14.7 ;  Surgeon: Birder Robson, MD;  Location: [facility];  Service: Ophthalmology;  Laterality: Right;   CATARACT EXTRACTION W/PHACO Left 09/09/2019   Procedure: CATARACT EXTRACTION  PHACO AND INTRAOCULAR LENS PLACEMENT (IOC) LEFT 8.29  01:03.3;  Surgeon: Birder Robson, MD;  Location: [facility];  Service: Ophthalmology;  Laterality: Left;   COLONOSCOPY     RENAL ARTERY STENT     TONSILLECTOMY     TOOTH EXTRACTION      Medical History: Past Medical History:  Diagnosis Date   A-fib ([place])    Asthma    in past   Heart murmur    Pt states she has Heart Murmur   Hypertension    Lower leg injury 04/24/2016   08/06/17- pt reports legs still not completely healed   Renal insufficiency     10%-Right, 90%-Left (per pt)   Skin cancer     Family History: Family History  Problem Relation Age of Onset   Hypertension Mother    Cancer Sister    Cancer Niece    Cancer Nephew     Social History   Socioeconomic History   Marital status: Widowed    Spouse name: Not on file   Number of children: Not on file   Years of education: Not on file   Highest education level: Not on file  Occupational History   Not on file  Tobacco Use   Smoking status: Never   Smokeless tobacco: Never  Vaping Use   Vaping Use: Never used  Substance and Sexual Activity   Alcohol use: No   Drug use: No   Sexual activity: Not on file  Other Topics Concern   Not on file  Social History Narrative   Not on file   Social Determinants of Health   Financial Resource Strain: Not on file  Food Insecurity: Not on file  Transportation Needs: Not on file  Physical Activity: Not on file  Stress: Not on file  Social Connections: Not on file  Intimate Partner Violence: Not on file      Review of Systems  Constitutional:  Negative for chills, fatigue and unexpected weight change.  HENT:  Negative for congestion, postnasal drip, rhinorrhea, sneezing and sore throat.   Eyes:  Negative for redness.  Respiratory:  Negative for cough, chest tightness and shortness of breath.   Cardiovascular:  Negative for chest pain and palpitations.  Gastrointestinal:  Negative for abdominal pain, constipation, diarrhea, nausea and vomiting.  Genitourinary:  Negative for dysuria and frequency.  Musculoskeletal:  Positive for arthralgias and back pain. Negative for joint swelling and neck pain.  Skin:  Negative for rash.  Neurological: Negative.  Negative for tremors and numbness.  Hematological:  Negative for adenopathy. Does not bruise/bleed easily.  Psychiatric/Behavioral:  Negative for behavioral problems (Depression), sleep disturbance and suicidal ideas. The patient is not nervous/anxious.     Vital  Signs: BP 137/65   Pulse 73   Temp 98 F (36.7 C)   Resp 16   Ht 5' (1.524 m)   Wt 91 lb (41.3 kg)   SpO2 97%   BMI 17.77 kg/m    Physical Exam Vitals and nursing note reviewed.  Constitutional:      Appearance: Normal appearance.  HENT:     Head: Normocephalic and atraumatic.     Nose: Nose normal.     Mouth/Throat:     Mouth: Mucous membranes are moist.     Pharynx: No posterior oropharyngeal erythema.  Eyes:     Extraocular Movements: Extraocular movements intact.     Pupils: Pupils are equal, round, and reactive to light.  Cardiovascular:     Rate and  Rhythm: Normal rate and regular rhythm.     Pulses: Normal pulses.     Heart sounds: Murmur heard.  Pulmonary:     Effort: Pulmonary effort is normal.     Breath sounds: Normal breath sounds.  Musculoskeletal:        General: Tenderness present.     Comments: Tenderness along upper left side of back  Skin:    General: Skin is warm and dry.  Neurological:     General: No focal deficit present.     Mental Status: She is alert.     Comments: using walker  Psychiatric:        Mood and Affect: Mood normal.        Behavior: Behavior normal.        Assessment/Plan: 1. Muscle strain of left upper back, subsequent encounter May take tylenol as needed and should avoid exacerbating activities.  2. Systolic hypertension, isolated Well controlled in office today, will continue current medications  3. Heart murmur Will schedule follow up with cardiology   General Counseling: Marene Lenz understanding of the findings of todays visit and agrees with plan of treatment. I have discussed any further diagnostic evaluation that may be needed or ordered today. We also reviewed her medications today. she has been encouraged to call the office with any questions or concerns that should arise related to todays visit.    No orders of the defined types were placed in this encounter.   No orders of the defined types  were placed in this encounter.   This patient was seen by Drema Dallas, PA-C in collaboration with Dr. Clayborn Bigness as a part of collaborative care agreement.   Total time spent:35 Minutes Time spent includes review of chart, medications, test results, and follow up plan with the patient.      Dr [name] Internal medicine

## 2022-01-25 ENCOUNTER — Ambulatory Visit (INDEPENDENT_AMBULATORY_CARE_PROVIDER_SITE_OTHER): Payer: Medicare Other | Admitting: Nurse Practitioner

## 2022-01-25 ENCOUNTER — Encounter: Payer: Self-pay | Admitting: Nurse Practitioner

## 2022-01-25 VITALS — BP 170/80 | HR 88 | Temp 98.0°F | Resp 16 | Ht 60.0 in | Wt 90.0 lb

## 2022-01-25 DIAGNOSIS — G8929 Other chronic pain: Secondary | ICD-10-CM

## 2022-01-25 DIAGNOSIS — Z760 Encounter for issue of repeat prescription: Secondary | ICD-10-CM

## 2022-01-25 DIAGNOSIS — R300 Dysuria: Secondary | ICD-10-CM

## 2022-01-25 DIAGNOSIS — M5000 Cervical disc disorder with myelopathy, unspecified cervical region: Secondary | ICD-10-CM

## 2022-01-25 DIAGNOSIS — D0470 Carcinoma in situ of skin of unspecified lower limb, including hip: Secondary | ICD-10-CM

## 2022-01-25 DIAGNOSIS — M419 Scoliosis, unspecified: Secondary | ICD-10-CM

## 2022-01-25 DIAGNOSIS — I10 Essential (primary) hypertension: Secondary | ICD-10-CM | POA: Diagnosis not present

## 2022-01-25 DIAGNOSIS — F411 Generalized anxiety disorder: Secondary | ICD-10-CM

## 2022-01-25 DIAGNOSIS — Z0001 Encounter for general adult medical examination with abnormal findings: Secondary | ICD-10-CM

## 2022-01-25 DIAGNOSIS — J3089 Other allergic rhinitis: Secondary | ICD-10-CM

## 2022-01-25 MED ORDER — MONTELUKAST SODIUM 10 MG PO TABS: 10.0000 mg | ORAL_TABLET | Freq: Every day | ORAL | 3 refills | Status: DC

## 2022-01-25 MED ORDER — PREDNISONE 5 MG PO TABS: ORAL_TABLET | ORAL | 1 refills | Status: DC

## 2022-01-25 MED ORDER — IRBESARTAN 300 MG PO TABS: ORAL_TABLET | ORAL | 1 refills | Status: DC

## 2022-01-25 NOTE — Progress Notes (Signed)
[facility] [place], [place] [address]  Internal MEDICINE  Office Visit Note  Patient Name: [name]  [id number]  [id number]  Date of Service: 01/25/2022  Chief Complaint  Patient presents with   Medicare Wellness   Hypertension    HPI [name] presents for an annual well visit and physical exam. Well appearing 88 year old female with PVD, GAD, and aortic valve insufficiency. Accompanied to visit by her daughter today.  --no preventive screenings due --routine labs done at nephrology office  --BP poorly controlled, on several medications but does not adhere to medication regimen. She is not consistent with taking them. Sometimes refuses to take the full dose or whole dose because the medications make her feel tired and groggy. She also wakes up late around 11 AM or noon so she is not able to get all of the doses in esp for medications that are taken 3 times a day.  --Fell at home in bathroom off toilet last night. Reports no significant injuries. Did not go to ER or have any imaging done but reports no significant pain related to the fall.  --Has a spot of squamous cell carcinoma that she declined to have removed --also has many skin growths and scabs on bilateral lower legs, chest and left hand/forearm. Has been told these are cancerous growths.       Current Medication: Outpatient Encounter Medications as of 01/25/2022  Medication Sig   amLODipine (NORVASC) 2.5 MG tablet Take by mouth.   Ascorbic Acid (VITAMIN C) 1000 MG tablet Take 1,000 mg by mouth daily.   calcium-vitamin D (OSCAL WITH D) 500-200 MG-UNIT tablet Take 1 tablet by mouth daily with breakfast.   cholecalciferol (VITAMIN D) 1000 units tablet Take 1,000 Units by mouth daily.   clonazePAM (KLONOPIN) 1 MG tablet TAKE 1/2 TO 1 TABLET BY MOUTH AT BEDTIME AS NEEDED FOR INSOMNIA   ELIQUIS 2.5 MG TABS tablet TAKE 1 TABLET BY MOUTH TWICE A DAY   hydrALAZINE (APRESOLINE) 50 MG tablet Take  1 tablet (50 mg total) by mouth 3 (three) times daily.   labetalol (NORMODYNE) 300 MG tablet Take 1 tablet by mouth 2 (two) times daily.   Polyethyl Glycol-Propyl Glycol (SYSTANE OP) Apply to eye daily.   pregabalin (LYRICA) 25 MG capsule Take 25 mg once a day for a week then increase to 25 mg twice a day and continue that dose   vitamin B-12 (CYANOCOBALAMIN) 1000 MCG tablet Take 1,000 mcg by mouth daily.   [DISCONTINUED] irbesartan (AVAPRO) 300 MG tablet TAKE 1/2 TABLET (150 MG TOTAL) BY MOUTH IN THE MORNING AND 1/2 TABLET AT BEDTIME.   [DISCONTINUED] montelukast (SINGULAIR) 10 MG tablet Take 1 tablet (10 mg total) by mouth daily.   [DISCONTINUED] predniSONE (DELTASONE) 5 MG tablet TAKE 1 TABLET BY MOUTH EVERY DAY WITH BREAKFAST   amLODipine (NORVASC) 5 MG tablet Take 5 mg by mouth daily.   irbesartan (AVAPRO) 300 MG tablet TAKE 1/2 TABLET (150 MG TOTAL) BY MOUTH IN THE MORNING AND 1/2 TABLET AT BEDTIME.   montelukast (SINGULAIR) 10 MG tablet Take 1 tablet (10 mg total) by mouth daily.   predniSONE (DELTASONE) 5 MG tablet TAKE 1 TABLET BY MOUTH EVERY DAY WITH BREAKFAST   No facility-administered encounter medications on file as of 01/25/2022.    Surgical History: Past Surgical History:  Procedure Laterality Date   ABDOMINAL HYSTERECTOMY     CATARACT EXTRACTION W/PHACO Right 07/01/2019   Procedure: CATARACT EXTRACTION PHACO AND INTRAOCULAR LENS  PLACEMENT (IOC) RIGHT 9.25 01:14.7 ;  Surgeon: Birder Robson, MD;  Location: [place];  Service: Ophthalmology;  Laterality: Right;   CATARACT EXTRACTION W/PHACO Left 09/09/2019   Procedure: CATARACT EXTRACTION PHACO AND INTRAOCULAR LENS PLACEMENT (IOC) LEFT 8.29  01:03.3;  Surgeon: Birder Robson, MD;  Location: [place];  Service: Ophthalmology;  Laterality: Left;   COLONOSCOPY     RENAL ARTERY STENT     TONSILLECTOMY     TOOTH EXTRACTION      Medical History: Past Medical History:  Diagnosis Date   A-fib ([place])     Asthma    in past   Heart murmur    Pt states she has Heart Murmur   Hypertension    Lower leg injury 04/24/2016   08/06/17- pt reports legs still not completely healed   Renal insufficiency    10%-Right, 90%-Left (per pt)   Skin cancer     Family History: Family History  Problem Relation Age of Onset   Hypertension Mother    Cancer Sister    Cancer Niece    Cancer Nephew     Social History   Socioeconomic History   Marital status: Widowed    Spouse name: Not on file   Number of children: Not on file   Years of education: Not on file   Highest education level: Not on file  Occupational History   Not on file  Tobacco Use   Smoking status: Never   Smokeless tobacco: Never  Vaping Use   Vaping Use: Never used  Substance and Sexual Activity   Alcohol use: No   Drug use: No   Sexual activity: Not on file  Other Topics Concern   Not on file  Social History Narrative   Not on file   Social Determinants of Health   Financial Resource Strain: Not on file  Food Insecurity: Not on file  Transportation Needs: Not on file  Physical Activity: Not on file  Stress: Not on file  Social Connections: Not on file  Intimate Partner Violence: Not on file      Review of Systems  Constitutional:  Negative for activity change, appetite change, chills, fatigue, fever and unexpected weight change.  HENT: Negative.  Negative for congestion, ear pain, rhinorrhea, sore throat and trouble swallowing.   Eyes: Negative.   Respiratory: Negative.  Negative for cough, chest tightness, shortness of breath and wheezing.   Cardiovascular: Negative.  Negative for chest pain.  Gastrointestinal: Negative.  Negative for abdominal pain, blood in stool, constipation, diarrhea, nausea and vomiting.  Endocrine: Negative.   Genitourinary: Negative.  Negative for difficulty urinating, dysuria, frequency, hematuria and urgency.  Musculoskeletal:  Positive for back pain. Negative for arthralgias,  joint swelling, myalgias and neck pain.  Skin: Negative.  Negative for rash and wound.  Allergic/Immunologic: Negative.  Negative for immunocompromised state.  Neurological:  Positive for headaches. Negative for dizziness, seizures and numbness.  Hematological: Negative.   Psychiatric/Behavioral: Negative.  Negative for behavioral problems, self-injury and suicidal ideas. The patient is not nervous/anxious.     Vital Signs: BP (!) 170/80   Pulse 88   Temp 98 F (36.7 C)   Resp 16   Ht 5' (1.524 m)   Wt 90 lb (40.8 kg)   SpO2 99%   BMI 17.58 kg/m    Physical Exam Vitals reviewed.  Constitutional:      General: She is awake. She is not in acute distress.    Appearance: Normal appearance. She  is well-developed, well-groomed and normal weight. She is not ill-appearing or diaphoretic.  HENT:     Head: Normocephalic and atraumatic.     Right Ear: Tympanic membrane, ear canal and external ear normal.     Left Ear: Tympanic membrane, ear canal and external ear normal.     Nose: Nose normal. No congestion or rhinorrhea.     Mouth/Throat:     Lips: Pink.     Mouth: Mucous membranes are moist.     Pharynx: Oropharynx is clear. Uvula midline. No oropharyngeal exudate or posterior oropharyngeal erythema.  Eyes:     General: Lids are normal. Vision grossly intact. Gaze aligned appropriately.     Extraocular Movements: Extraocular movements intact.     Conjunctiva/sclera: Conjunctivae normal.     Pupils: Pupils are equal, round, and reactive to light.     Funduscopic exam:    Right eye: Red reflex present.        Left eye: Red reflex present. Neck:     Vascular: No carotid bruit.     Trachea: Trachea and phonation normal. No tracheal deviation.  Cardiovascular:     Rate and Rhythm: Normal rate and regular rhythm.     Pulses:          Carotid pulses are 3+ on the right side and 3+ on the left side.      Radial pulses are 2+ on the right side and 2+ on the left side.       Dorsalis  pedis pulses are 1+ on the right side and 1+ on the left side.       Posterior tibial pulses are 1+ on the right side and 1+ on the left side.     Heart sounds: S1 normal and S2 normal. Murmur heard.     No friction rub. No gallop.  Pulmonary:     Effort: Pulmonary effort is normal. No accessory muscle usage or respiratory distress.     Breath sounds: Normal breath sounds and air entry. No wheezing.  Abdominal:     General: Bowel sounds are normal.     Palpations: Abdomen is soft. There is no shifting dullness, fluid wave, mass or pulsatile mass.     Tenderness: There is no abdominal tenderness. There is no guarding or rebound.  Musculoskeletal:        General: No swelling, deformity or signs of injury.     Cervical back: Normal range of motion and neck supple.     Right lower leg: 1+ Edema present.     Left lower leg: 1+ Edema present.  Lymphadenopathy:     Cervical: No cervical adenopathy.  Skin:    General: Skin is warm and dry.     Capillary Refill: Capillary refill takes less than 2 seconds.  Neurological:     Mental Status: She is alert and oriented to person, place, and time.     Cranial Nerves: No cranial nerve deficit.     Coordination: Coordination normal.     Gait: Gait normal.  Psychiatric:        Mood and Affect: Mood and affect normal.        Behavior: Behavior normal. Behavior is cooperative.        Thought Content: Thought content normal.        Judgment: Judgment normal.        Assessment/Plan: 1. Encounter for routine adult health examination with abnormal findings Age-appropriate preventive screenings and vaccinations discussed, annual physical exam completed. Routine  labs drawn at nephrology.  PHM updated.   2. Systolic hypertension, isolated Discussed adherence to medication regimen and the risk of uncontrolled blood pressure. Patient agreed she will work on taking her medications more consistently  3. 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 Sees dermatology  4. Dysuria Routine urinalysis ordered - UA/M w/rflx Culture, Routine  5. Medication refill - amLODipine (NORVASC) 5 MG tablet; Take 5 mg by mouth daily. - predniSONE (DELTASONE) 5 MG tablet; TAKE 1 TABLET BY MOUTH EVERY DAY WITH BREAKFAST  Dispense: 90 tablet; Refill: 1 - montelukast (SINGULAIR) 10 MG tablet; Take 1 tablet (10 mg total) by mouth daily.  Dispense: 90 tablet; Refill: 3 - irbesartan (AVAPRO) 300 MG tablet; TAKE 1/2 TABLET (150 MG TOTAL) BY MOUTH IN THE MORNING AND 1/2 TABLET AT BEDTIME.  Dispense: 180 tablet; Refill: 1      General Counseling: [name] verbalizes understanding of the findings of todays visit and agrees with plan of treatment. I have discussed any further diagnostic evaluation that may be needed or ordered today. We also reviewed her medications today. she has been encouraged to call the office with any questions or concerns that should arise related to todays visit.    Orders Placed This Encounter  Procedures   UA/M w/rflx Culture, Routine    Meds ordered this encounter  Medications   predniSONE (DELTASONE) 5 MG tablet    Sig: TAKE 1 TABLET BY MOUTH EVERY DAY WITH BREAKFAST    Dispense:  90 tablet    Refill:  1   montelukast (SINGULAIR) 10 MG tablet    Sig: Take 1 tablet (10 mg total) by mouth daily.    Dispense:  90 tablet    Refill:  3   irbesartan (AVAPRO) 300 MG tablet    Sig: TAKE 1/2 TABLET (150 MG TOTAL) BY MOUTH IN THE MORNING AND 1/2 TABLET AT BEDTIME.    Dispense:  180 tablet    Refill:  1    Return in about 3 months (around 04/27/2022) for F/U, [name] PCP .   Total time spent:30 Minutes Time spent includes review of chart, medications, test results, and follow up plan with the patient.   [place] Controlled Substance Database was reviewed by me.  This patient was seen by Ashley Osgood, FNP-C in collaboration with Dr. Clayborn Bigness as a part  of collaborative care agreement.  Ashley Hawks R. Valetta Fuller, MSN, FNP-C Internal medicine

## 2022-03-05 ENCOUNTER — Other Ambulatory Visit: Payer: Self-pay | Admitting: Cardiovascular Disease

## 2022-03-05 DIAGNOSIS — I10 Essential (primary) hypertension: Secondary | ICD-10-CM

## 2022-03-06 NOTE — Telephone Encounter (Signed)
Please advise if ok to refill medication listed under historical provider as prescribing. Thank you so much.
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
## 2022-03-21 ENCOUNTER — Ambulatory Visit: Payer: Medicare Other | Attending: Cardiovascular Disease | Admitting: Cardiovascular Disease

## 2022-03-21 ENCOUNTER — Encounter: Payer: Self-pay | Admitting: Cardiovascular Disease

## 2022-03-21 VITALS — BP 120/60 | HR 78 | Ht 60.0 in | Wt 89.5 lb

## 2022-03-21 DIAGNOSIS — I480 Paroxysmal atrial fibrillation: Secondary | ICD-10-CM

## 2022-03-21 DIAGNOSIS — I10 Essential (primary) hypertension: Secondary | ICD-10-CM | POA: Diagnosis not present

## 2022-03-21 DIAGNOSIS — I350 Nonrheumatic aortic (valve) stenosis: Secondary | ICD-10-CM

## 2022-03-21 DIAGNOSIS — I739 Peripheral vascular disease, unspecified: Secondary | ICD-10-CM

## 2022-03-21 MED ORDER — LABETALOL HCL 300 MG PO TABS: 300.0000 mg | ORAL_TABLET | Freq: Two times a day (BID) | ORAL | 1 refills | Status: DC

## 2022-03-21 MED ORDER — APIXABAN 2.5 MG PO TABS: 2.5000 mg | ORAL_TABLET | Freq: Two times a day (BID) | ORAL | 1 refills | Status: DC

## 2022-03-21 NOTE — Patient Instructions (Signed)
Medication Instructions:  No changes  If you need a refill on your cardiac medications before your next appointment, please call your pharmacy.   Lab work: No new labs needed  Testing/Procedures: No new testing needed  Follow-Up: At [facility], you and your health needs are our priority.  As part of our continuing mission to provide you with exceptional heart care, we have created designated Provider Care Teams.  These Care Teams include your primary Cardiologist (physician) and Advanced Practice Providers (APPs -  Physician Assistants and Nurse Practitioners) who all work together to provide you with the care you need, when you need it.  You will need a follow up appointment in 12 months  Providers on your designated Care Team:   Christopher Berge, NP Ryan Dunn, PA-C Cadence Furth, PA-C  COVID-19 Vaccine Information can be found at: [email or web address] For questions related to vaccine distribution or appointments, please email [email or web address] or call [phone].   

## 2022-03-28 ENCOUNTER — Encounter: Payer: Self-pay | Admitting: Nurse Practitioner

## 2022-03-29 ENCOUNTER — Emergency Department: Payer: Medicare Other

## 2022-03-29 ENCOUNTER — Emergency Department
Admission: EM | Admit: 2022-03-29 | Discharge: 2022-03-29 | Disposition: A | Discharge status: Home / Self Care | Payer: Medicare Other | Attending: Emergency Medicine | Admitting: Emergency Medicine

## 2022-03-29 DIAGNOSIS — W01198A Fall on same level from slipping, tripping and stumbling with subsequent striking against other object, initial encounter: Secondary | ICD-10-CM | POA: Diagnosis not present

## 2022-03-29 DIAGNOSIS — Y92002 Bathroom of unspecified non-institutional (private) residence single-family (private) house as the place of occurrence of the external cause: Secondary | ICD-10-CM | POA: Insufficient documentation

## 2022-03-29 DIAGNOSIS — M542 Cervicalgia: Secondary | ICD-10-CM | POA: Insufficient documentation

## 2022-03-29 DIAGNOSIS — E860 Dehydration: Secondary | ICD-10-CM | POA: Diagnosis not present

## 2022-03-29 DIAGNOSIS — W19XXXA Unspecified fall, initial encounter: Secondary | ICD-10-CM

## 2022-03-29 DIAGNOSIS — Z7901 Long term (current) use of anticoagulants: Secondary | ICD-10-CM | POA: Insufficient documentation

## 2022-03-29 DIAGNOSIS — R42 Dizziness and giddiness: Secondary | ICD-10-CM | POA: Diagnosis present

## 2022-03-29 LAB — COMPREHENSIVE METABOLIC PANEL
ALT: 18 U/L (ref 0–44)
AST: 26 U/L (ref 15–41)
Albumin: 3.9 g/dL (ref 3.5–5.0)
Alkaline Phosphatase: 48 U/L (ref 38–126)
Anion gap: 7 (ref 5–15)
BUN: 30 mg/dL — ABNORMAL HIGH (ref 8–23)
CO2: 25 mmol/L (ref 22–32)
Calcium: 9.9 mg/dL (ref 8.9–10.3)
Chloride: 108 mmol/L (ref 98–111)
Creatinine, Ser: 1.26 mg/dL — ABNORMAL HIGH (ref 0.44–1.00)
GFR, Estimated: 40 mL/min — ABNORMAL LOW (ref >60)
Glucose, Bld: 103 mg/dL — ABNORMAL HIGH (ref 70–99)
Potassium: 4 mmol/L (ref 3.5–5.1)
Sodium: 140 mmol/L (ref 135–145)
Total Bilirubin: 1 mg/dL (ref 0.3–1.2)
Total Protein: 6.3 g/dL — ABNORMAL LOW (ref 6.5–8.1)

## 2022-03-29 LAB — CBC
HCT: 32.4 % — ABNORMAL LOW (ref 36.0–46.0)
Hemoglobin: 10.6 g/dL — ABNORMAL LOW (ref 12.0–15.0)
MCH: 31.3 pg (ref 26.0–34.0)
MCHC: 32.7 g/dL (ref 30.0–36.0)
MCV: 95.6 fL (ref 80.0–100.0)
Platelets: 227 10*3/uL (ref 150–400)
RBC: 3.39 MIL/uL — ABNORMAL LOW (ref 3.87–5.11)
RDW: 12.8 % (ref 11.5–15.5)
WBC: 8 10*3/uL (ref 4.0–10.5)
nRBC: 0 % (ref 0.0–0.2)

## 2022-03-29 LAB — TROPONIN I (HIGH SENSITIVITY): Troponin I (High Sensitivity): 10 ng/L (ref <18)

## 2022-03-29 NOTE — ED Notes (Signed)
Pt refused blood work and C-spine collar in triage.

## 2022-03-29 NOTE — ED Provider Triage Note (Signed)
Emergency Medicine Provider Triage Evaluation Note  [name] , a 88 y.o. female  was evaluated in triage.  Pt complains of fall, head injury, on Eliquis, complaining hip pain.  Patient states she started to pass out and fell hitting her head on the tub.  Complaining of severe neck pain..  Review of Systems  Positive:  Negative:   Physical Exam  BP (!) 199/88 (BP Location: Left Arm)   Pulse 80   Temp 98.4 F (36.9 C) (Oral)   Resp 18   SpO2 100%  Gen:   Awake, no distress   Resp:  Normal effort  MSK:   Patient sitting in wheelchair, tender at C-spine Other:    Medical Decision Making  Medically screening exam initiated at 3:12 PM.  Appropriate orders placed.  [name] was informed that the remainder of the evaluation will be completed by another provider, this initial triage assessment does not replace that evaluation, and the importance of remaining in the ED until their evaluation is complete.  Due to possible syncope along with fall we will do cardiac work-up along with mechanical fall work-up   Ashley Starks, PA-C 03/29/22 1512

## 2022-03-29 NOTE — ED Triage Notes (Signed)
Pt sts that she fell this AM in the bathroom. Pt sts that she hit her head on the bathtub. Pt does take Eliquis. Pt has a bump on the back of her head the size of a golf ball.

## 2022-03-29 NOTE — Discharge Instructions (Addendum)
Can take eliquis tomorrow

## 2022-03-31 ENCOUNTER — Other Ambulatory Visit: Payer: Self-pay | Admitting: Nurse Practitioner

## 2022-03-31 ENCOUNTER — Telehealth: Payer: Self-pay | Admitting: Nurse Practitioner

## 2022-03-31 DIAGNOSIS — F411 Generalized anxiety disorder: Secondary | ICD-10-CM

## 2022-03-31 NOTE — Telephone Encounter (Signed)
LAST 8/23 AND NEXT11/28/23

## 2022-03-31 NOTE — Telephone Encounter (Signed)
Called daughter to schedule ED f/u. She stated patient is doing okay. Will just see provider 04/25/22-Toni

## 2022-04-03 NOTE — Telephone Encounter (Signed)
Send message to [employer]

## 2022-04-05 NOTE — ED Provider Notes (Signed)
[facility] Provider Note    Event Date/Time   First MD Initiated Contact with Patient 03/29/22 1550     (approximate)   History   Fall   HPI  [name] is a 88 y.o. female who presents after a fall, she reports she does take Eliquis.  She reports she hit her head, she reports mild neck pain to me.  No LOC.  No neurodeficits.  No nausea or vomiting.  No change in vision.  She reports she frequently becomes dizzy and she thinks that may have happened today.     Physical Exam   Triage Vital Signs: ED Triage Vitals  Enc Vitals Group     BP 03/29/22 1504 (!) 199/88     Pulse Rate 03/29/22 1504 80     Resp 03/29/22 1504 18     Temp 03/29/22 1504 98.4 F (36.9 C)     Temp Source 03/29/22 1504 Oral     SpO2 03/29/22 1504 100 %     Weight --      Height --      Head Circumference --      Peak Flow --      Pain Score 03/29/22 1505 6     Pain Loc --      Pain Edu? --      Excl. in [place]? --     Most recent vital signs: Vitals:   03/29/22 1504 03/29/22 1736  BP: (!) 199/88 (!) 165/67  Pulse: 80 74  Resp: 18 16  Temp: 98.4 F (36.9 C) 98.1 F (36.7 C)  SpO2: 100% 98%     General: Awake, no distress.  CV:  Good peripheral perfusion.  Regular rate and rhythmResp:  Normal effort.  Abd:  No distention.  Other:  No vertebral tenderness to palpation, with axial load on the left hip or right hip.  Normal range of motion of all extremities.  No chest wall tenderness to palpation   ED Results / Procedures / Treatments   Labs (all labs ordered are listed, but only abnormal results are displayed) Labs Reviewed  CBC - Abnormal; Notable for the following components:      Result Value   RBC 3.39 (*)    Hemoglobin 10.6 (*)    HCT 32.4 (*)    All other components within normal limits  COMPREHENSIVE METABOLIC PANEL - Abnormal; Notable for the following components:   Glucose, Bld 103 (*)    BUN 30 (*)    Creatinine, Ser 1.26 (*)    Total  Protein 6.3 (*)    GFR, Estimated 40 (*)    All other components within normal limits  TROPONIN I (HIGH SENSITIVITY)     EKG  ED ECG REPORT I, Lavonia Drafts, the attending physician, personally viewed and interpreted this ECG.  Date: 04/05/2022  Rhythm: normal sinus rhythm QRS Axis: normal Intervals: normal ST/T Wave abnormalities: Nonspecific changes Narrative Interpretation: no evidence of acute ischemia    RADIOLOGY X-ray viewed and interpreted by me, no fracture    PROCEDURES:  Critical Care performed:   Procedures   MEDICATIONS ORDERED IN ED: Medications - No data to display   IMPRESSION / MDM / [place] / ED COURSE  I reviewed the triage vital signs and the nursing notes. Patient's presentation is most consistent with acute presentation with potential threat to life or bodily function.  Patient presents for a fall, reportedly on Eliquis.  Overall she is well-appearing and in  no acute distress.  Was complaining of neck pain and CT head and cervical spine was performed which is reassuring.  No evidence of ICH.  No cervical fracture.  Hip x-ray is negative for fracture, suspect contusion of the left hip.  Lab work reviewed and is reassuring, mild dehydration noted.  Considered admission however patient is feeling well, appropriate for discharge with outpatient follow-up with PCP, recommend p.o. hydration, return precautions discussed        FINAL CLINICAL IMPRESSION(S) / ED DIAGNOSES   Final diagnoses:  Fall, initial encounter  Dehydration     Rx / DC Orders   ED Discharge Orders     None        Note:  This document was prepared using Dragon voice recognition software and may include unintentional dictation errors.   Lavonia Drafts, MD 04/05/22 1146

## 2022-04-06 NOTE — Telephone Encounter (Signed)
Med sent to pharmacy.

## 2022-04-17 ENCOUNTER — Telehealth: Payer: Self-pay | Admitting: Nurse Practitioner

## 2022-04-17 NOTE — Telephone Encounter (Signed)
Received FL2 to be completed. Gave to Alyssa to completed. Attached last 2 office notes and immunizations record-Toni

## 2022-04-25 ENCOUNTER — Encounter: Payer: Self-pay | Admitting: Nurse Practitioner

## 2022-04-25 ENCOUNTER — Ambulatory Visit (INDEPENDENT_AMBULATORY_CARE_PROVIDER_SITE_OTHER): Payer: Medicare Other | Admitting: Nurse Practitioner

## 2022-04-25 VITALS — BP 129/56 | HR 70 | Temp 97.7°F | Resp 16 | Ht 60.0 in | Wt 85.2 lb

## 2022-04-25 DIAGNOSIS — H9113 Presbycusis, bilateral: Secondary | ICD-10-CM

## 2022-04-25 DIAGNOSIS — I10 Essential (primary) hypertension: Secondary | ICD-10-CM

## 2022-04-25 DIAGNOSIS — N8110 Cystocele, unspecified: Secondary | ICD-10-CM

## 2022-04-25 DIAGNOSIS — F411 Generalized anxiety disorder: Secondary | ICD-10-CM | POA: Diagnosis not present

## 2022-04-25 DIAGNOSIS — I351 Nonrheumatic aortic (valve) insufficiency: Secondary | ICD-10-CM

## 2022-04-25 DIAGNOSIS — M5442 Lumbago with sciatica, left side: Secondary | ICD-10-CM

## 2022-04-25 DIAGNOSIS — M5441 Lumbago with sciatica, right side: Secondary | ICD-10-CM | POA: Diagnosis not present

## 2022-04-25 DIAGNOSIS — M419 Scoliosis, unspecified: Secondary | ICD-10-CM

## 2022-04-25 DIAGNOSIS — G8929 Other chronic pain: Secondary | ICD-10-CM

## 2022-04-25 DIAGNOSIS — M5481 Occipital neuralgia: Secondary | ICD-10-CM

## 2022-04-25 NOTE — Progress Notes (Signed)
[facility] [place], [place] [address]  Internal MEDICINE  Office Visit Note  Patient Name: [name]  [id number]  [id number]  Date of Service: 04/25/2022  Chief Complaint  Patient presents with   Follow-up    HPI [name] presents for a follow-up visit for recent fall, hypertension, and FL2 form for moving into assisted living facility.  Fell on 11/1 and hit her head  [place] again 11/3 BP doing better -- no changes with meds with Dr. Rockey Situ.  Going to [facility] in [place]. Possibly, need FL2 form filled out   Current Medication: Outpatient Encounter Medications as of 04/25/2022  Medication Sig   amLODipine (NORVASC) 5 MG tablet Take 5 mg by mouth daily.   apixaban (ELIQUIS) 2.5 MG TABS tablet Take 1 tablet (2.5 mg total) by mouth 2 (two) times daily.   Ascorbic Acid (VITAMIN C) 1000 MG tablet Take 1,000 mg by mouth daily.   calcium-vitamin D (OSCAL WITH D) 500-200 MG-UNIT tablet Take 1 tablet by mouth daily with breakfast.   clonazePAM (KLONOPIN) 1 MG tablet TAKE 1/2 TO 1 TABLET BY MOUTH AT BEDTIME AS NEEDED FOR INSOMNIA   irbesartan (AVAPRO) 300 MG tablet TAKE 1/2 TABLET (150 MG TOTAL) BY MOUTH IN THE MORNING AND 1/2 TABLET AT BEDTIME.   labetalol (NORMODYNE) 300 MG tablet Take 1 tablet (300 mg total) by mouth 2 (two) times daily.   montelukast (SINGULAIR) 10 MG tablet Take 1 tablet (10 mg total) by mouth daily.   Polyethyl Glycol-Propyl Glycol (SYSTANE OP) Apply to eye daily.   predniSONE (DELTASONE) 5 MG tablet TAKE 1 TABLET BY MOUTH EVERY DAY WITH BREAKFAST   traMADol (ULTRAM) 50 MG tablet Take 25 mg (1/2 tablet) every morning. Can take extra 25 mg in afternoon as needed.   vitamin B-12 (CYANOCOBALAMIN) 1000 MCG tablet Take 1,000 mcg by mouth daily.   [DISCONTINUED] cholecalciferol (VITAMIN D) 1000 units tablet Take 1,000 Units by mouth daily.   [DISCONTINUED] hydrALAZINE (APRESOLINE) 50 MG tablet Take 1 tablet (50 mg total) by mouth 3  (three) times daily.   [DISCONTINUED] pregabalin (LYRICA) 25 MG capsule Take 25 mg once a day for a week then increase to 25 mg twice a day and continue that dose   No facility-administered encounter medications on file as of 04/25/2022.    Surgical History: Past Surgical History:  Procedure Laterality Date   ABDOMINAL HYSTERECTOMY     CATARACT EXTRACTION W/PHACO Right 07/01/2019   Procedure: CATARACT EXTRACTION PHACO AND INTRAOCULAR LENS PLACEMENT (IOC) RIGHT 9.25 01:14.7 ;  Surgeon: Birder Robson, MD;  Location: [facility];  Service: Ophthalmology;  Laterality: Right;   CATARACT EXTRACTION W/PHACO Left 09/09/2019   Procedure: CATARACT EXTRACTION PHACO AND INTRAOCULAR LENS PLACEMENT (IOC) LEFT 8.29  01:03.3;  Surgeon: Birder Robson, MD;  Location: [facility];  Service: Ophthalmology;  Laterality: Left;   COLONOSCOPY     RENAL ARTERY STENT     TONSILLECTOMY     TOOTH EXTRACTION      Medical History: Past Medical History:  Diagnosis Date   A-fib ([place])    Asthma    in past   Heart murmur    Pt states she has Heart Murmur   Hypertension    Lower leg injury 04/24/2016   08/06/17- pt reports legs still not completely healed   Renal insufficiency    10%-Right, 90%-Left (per pt)   Skin cancer     Family History: Family History  Problem Relation Age of Onset  Hypertension Mother    Cancer Sister    Cancer Niece    Cancer Nephew     Social History   Socioeconomic History   Marital status: Widowed    Spouse name: Not on file   Number of children: Not on file   Years of education: Not on file   Highest education level: Not on file  Occupational History   Not on file  Tobacco Use   Smoking status: Never   Smokeless tobacco: Never  Vaping Use   Vaping Use: Never used  Substance and Sexual Activity   Alcohol use: No   Drug use: No   Sexual activity: Not on file  Other Topics Concern   Not on file  Social History Narrative   Not on file    Social Determinants of Health   Financial Resource Strain: Not on file  Food Insecurity: Not on file  Transportation Needs: Not on file  Physical Activity: Not on file  Stress: Not on file  Social Connections: Not on file  Intimate Partner Violence: Not on file      Review of Systems  Constitutional:  Negative for activity change, appetite change, chills, fatigue, fever and unexpected weight change.  HENT: Negative.  Negative for congestion, ear pain, rhinorrhea, sore throat and trouble swallowing.   Eyes: Negative.   Respiratory: Negative.  Negative for cough, chest tightness, shortness of breath and wheezing.   Cardiovascular: Negative.  Negative for chest pain.  Gastrointestinal: Negative.  Negative for abdominal pain, blood in stool, constipation, diarrhea, nausea and vomiting.  Endocrine: Negative.   Genitourinary: Negative.  Negative for difficulty urinating, dysuria, frequency, hematuria and urgency.  Musculoskeletal:  Positive for back pain. Negative for arthralgias, joint swelling, myalgias and neck pain.  Skin: Negative.  Negative for rash and wound.  Allergic/Immunologic: Negative.  Negative for immunocompromised state.  Neurological:  Positive for headaches. Negative for dizziness, seizures and numbness.  Hematological: Negative.   Psychiatric/Behavioral: Negative.  Negative for behavioral problems, self-injury and suicidal ideas. The patient is not nervous/anxious.     Vital Signs: BP (!) 129/56   Pulse 70   Temp 97.7 F (36.5 C)   Resp 16   Ht 5' (1.524 m)   Wt 85 lb 3.2 oz (38.6 kg)   SpO2 99%   BMI 16.64 kg/m    Physical Exam Vitals reviewed.  Constitutional:      General: She is not in acute distress.    Appearance: Normal appearance. She is normal weight. She is not ill-appearing.  HENT:     Head: Normocephalic and atraumatic.  Eyes:     Pupils: Pupils are equal, round, and reactive to light.  Cardiovascular:     Rate and Rhythm: Normal rate  and regular rhythm.  Pulmonary:     Effort: Pulmonary effort is normal. No respiratory distress.  Neurological:     Mental Status: She is alert. Mental status is at baseline.     Motor: Weakness present.  Psychiatric:        Mood and Affect: Mood normal.        Behavior: Behavior normal.        Assessment/Plan: 1. Systolic hypertension, isolated Stable, sees cardiology, no changes  2. Chronic midline low back pain with bilateral sciatica Manageable, no issues, takes tramadol as needed.  3. GAD (generalized anxiety disorder) Stable, no changes, takes clonazepam as needed  4. Presbycusis of both ears Age-related, does not always wear hearing aids   General Counseling: [name]  verbalizes understanding of the findings of todays visit and agrees with plan of treatment. I have discussed any further diagnostic evaluation that may be needed or ordered today. We also reviewed her medications today. she has been encouraged to call the office with any questions or concerns that should arise related to todays visit.    No orders of the defined types were placed in this encounter.   No orders of the defined types were placed in this encounter.   Return in about 3 months (around 07/26/2022) for F/U, Tymeka Privette PCP.   Total time spent:30 Minutes Time spent includes review of chart, medications, test results, and follow up plan with the patient.   [place] Controlled Substance Database was reviewed by me.  This patient was seen by Jonetta Osgood, FNP-C in collaboration with Dr. Clayborn Bigness as a part of collaborative care agreement.   Kutter Schnepf R. Valetta Fuller, MSN, FNP-C Internal medicine

## 2022-05-08 ENCOUNTER — Other Ambulatory Visit: Payer: Self-pay | Admitting: Physical Medicine & Rehabilitation

## 2022-05-08 DIAGNOSIS — M9931 Osseous stenosis of neural canal of cervical region: Secondary | ICD-10-CM

## 2022-05-16 ENCOUNTER — Telehealth: Payer: Self-pay

## 2022-05-16 NOTE — Telephone Encounter (Signed)
Pt daughter pickup FL2 form

## 2022-05-31 NOTE — Progress Notes (Unsigned)
Referring Physician:  Doyle Askew, MD [place],  [place] [address]  Primary Physician:  Jonetta Osgood, NP  History of Present Illness: 06/01/2022 Ms. [name] is here today with a chief complaint of  neck pain that radiates up into the head, she does complain of occasional pain that radiates into the arms when she moves her neck.  Her worst pain is electric like pain that extends into her left scalp.  It is worse behind her ear.  Turning her head to the left makes it worse.   Bowel/Bladder Dysfunction: none  Conservative measures:  Physical therapy: has not participated in Multimodal medical therapy including regular antiinflammatories:  gabapentin, lyrica, tramadol Injections: has not received any epidural steroid injections  Past Surgery: denies  Marlyss Cissell has no symptoms of cervical myelopathy.  The symptoms are causing a significant impact on the patient's life.   I have utilized the care everywhere function in epic to review the outside records available from external health systems.  Review of Systems:  A 10 point review of systems is negative, except for the pertinent positives and negatives detailed in the HPI.  Past Medical History: Past Medical History:  Diagnosis Date   A-fib ([place])    Asthma    in past   Heart murmur    Pt states she has Heart Murmur   Hypertension    Lower leg injury 04/24/2016   08/06/17- pt reports legs still not completely healed   Renal insufficiency    10%-Right, 90%-Left (per pt)   Skin cancer     Past Surgical History: Past Surgical History:  Procedure Laterality Date   ABDOMINAL HYSTERECTOMY     CATARACT EXTRACTION W/PHACO Right 07/01/2019   Procedure: CATARACT EXTRACTION PHACO AND INTRAOCULAR LENS PLACEMENT (IOC) RIGHT 9.25 01:14.7 ;  Surgeon: Birder Robson, MD;  Location: [facility];  Service: Ophthalmology;  Laterality: Right;   CATARACT EXTRACTION W/PHACO Left  09/09/2019   Procedure: CATARACT EXTRACTION PHACO AND INTRAOCULAR LENS PLACEMENT (IOC) LEFT 8.29  01:03.3;  Surgeon: Birder Robson, MD;  Location: [facility];  Service: Ophthalmology;  Laterality: Left;   COLONOSCOPY     RENAL ARTERY STENT     TONSILLECTOMY     TOOTH EXTRACTION      Allergies: Allergies as of 06/01/2022 - Review Complete 06/01/2022  Allergen Reaction Noted   Penicillin g Diarrhea 01/18/2021   Bacitracin  04/21/2016   Neomycin  08/06/2017   Penicillamine Other (See Comments) 12/23/2013   Penicillins Diarrhea 04/21/2016    Medications: Current Meds  Medication Sig   amLODipine (NORVASC) 5 MG tablet Take 5 mg by mouth daily.   apixaban (ELIQUIS) 2.5 MG TABS tablet Take 1 tablet (2.5 mg total) by mouth 2 (two) times daily.   clonazePAM (KLONOPIN) 1 MG tablet TAKE 1/2 TO 1 TABLET BY MOUTH AT BEDTIME AS NEEDED FOR INSOMNIA   irbesartan (AVAPRO) 300 MG tablet TAKE 1/2 TABLET (150 MG TOTAL) BY MOUTH IN THE MORNING AND 1/2 TABLET AT BEDTIME.   labetalol (NORMODYNE) 300 MG tablet Take 1 tablet (300 mg total) by mouth 2 (two) times daily.   montelukast (SINGULAIR) 10 MG tablet Take 1 tablet (10 mg total) by mouth daily.   Polyethyl Glycol-Propyl Glycol (SYSTANE OP) Apply to eye daily.   predniSONE (DELTASONE) 5 MG tablet TAKE 1 TABLET BY MOUTH EVERY DAY WITH BREAKFAST   traMADol (ULTRAM) 50 MG tablet Take 25 mg (1/2 tablet) every morning. Can take extra 25  mg in afternoon as needed.   vitamin B-12 (CYANOCOBALAMIN) 1000 MCG tablet Take 1,000 mcg by mouth daily.    Social History: Social History   Tobacco Use   Smoking status: Never   Smokeless tobacco: Never  Vaping Use   Vaping Use: Never used  Substance Use Topics   Alcohol use: No   Drug use: No    Family Medical History: Family History  Problem Relation Age of Onset   Hypertension Mother    Cancer Sister    Cancer Niece    Cancer Nephew     Physical Examination: Vitals:   06/01/22 1447   BP: 132/82    General: Patient is well developed, well nourished, calm, collected, and in no apparent distress. Attention to examination is appropriate.  Neck:   Supple.  Limited range of motion.  She has very limited rotation.  Rotating to the left makes her left scalp pain worse.  Respiratory: Patient is breathing without any difficulty.   NEUROLOGICAL:     Awake, alert, oriented to person, place, and time.  Speech is clear and fluent.   Cranial Nerves: Pupils equal round and reactive to light.  Facial tone is symmetric.  Facial sensation is symmetric. Shoulder shrug is symmetric. Tongue protrusion is midline.  There is no pronator drift.  ROM of spine: full.    Strength: Side Biceps Triceps Deltoid Interossei Grip Wrist Ext. Wrist Flex.  R '5 5 5 5 5 5 5  '$ L '5 5 5 5 5 5 5   '$ Side Iliopsoas Quads Hamstring PF DF EHL  R '5 5 5 5 5 5  '$ L '5 5 5 5 5 5   '$ Reflexes are 1+ and symmetric at the biceps, triceps, brachioradialis, patella and achilles.   Hoffman's is absent.   Bilateral upper and lower extremity sensation is intact to light touch.    No evidence of dysmetria noted.  Gait is normal.     Medical Decision Making  Imaging: CT C spine 03/29/2022 IMPRESSION: 1. Stable noncontrast head CT with no acute intracranial pathology. 2. No acute fracture or traumatic malalignment of the cervical spine. 3. Bulky degenerative pannus about the dens resulting in severe spinal canal stenosis with likely cord compression. Also at least moderate spinal canal stenosis with likely cord compression at C2-C3 due to a partially calcified disc protrusion. 4. Offset of the C1 ring with the C2 lateral masses is unchanged and may reflect ligamentous laxity. 5. Advanced degenerative changes elsewhere in the cervical spine without other evidence of high-grade spinal canal stenosis. Findings are overall similar to the prior study.     Electronically Signed   By: Valetta Mole M.D.   On:  03/29/2022 16:03  I have personally reviewed the images and agree with the above interpretation.  Assessment and Plan: Ms. Westendorf is a pleasant 89 y.o. female with severe cervical spondylosis with left-sided occipital neuralgia.  I think she should be considered for occipital nerve block versus C1-2epidural steroid injection.  I will reach out to her providers at [facility] clinic to discuss.    Thank you for involving me in the care of this patient.      Lori Popowski K. Izora Ribas MD, [facility] Neurosurgery

## 2022-06-01 ENCOUNTER — Encounter: Payer: Self-pay | Admitting: Neurosurgery

## 2022-06-01 ENCOUNTER — Ambulatory Visit: Payer: Medicare Other | Admitting: Neurosurgery

## 2022-06-01 VITALS — BP 132/82 | Ht 60.0 in | Wt 84.2 lb

## 2022-06-01 DIAGNOSIS — M5481 Occipital neuralgia: Secondary | ICD-10-CM | POA: Diagnosis not present

## 2022-06-01 DIAGNOSIS — H9113 Presbycusis, bilateral: Secondary | ICD-10-CM | POA: Insufficient documentation

## 2022-06-01 DIAGNOSIS — G8929 Other chronic pain: Secondary | ICD-10-CM | POA: Insufficient documentation

## 2022-06-06 ENCOUNTER — Telehealth: Payer: Self-pay | Admitting: Nurse Practitioner

## 2022-06-06 NOTE — Telephone Encounter (Signed)
Received therapy order from [facility] to be complete. Gave to Alyssa to complete.

## 2022-06-07 ENCOUNTER — Telehealth: Payer: Self-pay | Admitting: Nurse Practitioner

## 2022-06-07 NOTE — Telephone Encounter (Signed)
Received paperwork from [facility] for MAR, F12, & order summary's. Gave to Alyssa for signature

## 2022-06-09 ENCOUNTER — Telehealth: Payer: Self-pay | Admitting: Nurse Practitioner

## 2022-06-09 NOTE — Telephone Encounter (Signed)
Faxed back to [place] 06/09/2022;  Huston Foley [phone]

## 2022-06-10 ENCOUNTER — Encounter: Payer: Self-pay | Admitting: Nurse Practitioner

## 2022-06-12 ENCOUNTER — Telehealth: Payer: Self-pay | Admitting: Nurse Practitioner

## 2022-06-12 NOTE — Telephone Encounter (Signed)
Faxed PT order back to [place]; [phone]. Scanned-Toni

## 2022-06-29 ENCOUNTER — Telehealth: Payer: Self-pay | Admitting: Nurse Practitioner

## 2022-06-29 NOTE — Telephone Encounter (Signed)
Received Physical Therapy order from [facility]. Gave to Alyssa for signature.-nm

## 2022-07-03 ENCOUNTER — Telehealth: Payer: Self-pay | Admitting: Cardiovascular Disease

## 2022-07-03 ENCOUNTER — Other Ambulatory Visit: Payer: Self-pay

## 2022-07-03 MED ORDER — APIXABAN 2.5 MG PO TABS: 2.5000 mg | ORAL_TABLET | Freq: Two times a day (BID) | ORAL | 5 refills | Status: DC

## 2022-07-03 NOTE — Telephone Encounter (Signed)
Prescription refill request for Eliquis received. Indication:Afib Last office visit:10/23 Scr:1.2  11/23 Age: 89 Weight:38.2  kg  Prescription refilled

## 2022-07-03 NOTE — Telephone Encounter (Signed)
Refill request

## 2022-07-03 NOTE — Telephone Encounter (Signed)
Prescription refill request for Eliquis received. Indication: PAF Last office visit: 03/21/22  Johnny Bridge MD Scr: 1.26 on 03/29/22 Age: 89 Weight: 40.6kg  Based on above findings Eliquis 2.'5mg'$  twice daily is the appropriate dose.  Refill approved.

## 2022-07-03 NOTE — Telephone Encounter (Signed)
*  STAT* If patient is at the pharmacy, call can be transferred to refill team.   1. Which medications need to be refilled? (please list name of each medication and dose if known)   apixaban (ELIQUIS) 2.5 MG TABS tablet   2. Which pharmacy/location (including street and city if local pharmacy) is medication to be sent to? [place] they need a 30 day or 90 day supply? 30 day supply    No longer wants medications to go to [facility] is having that prescription cancelled. Patient is completely out of medication.

## 2022-07-17 ENCOUNTER — Telehealth: Payer: Self-pay | Admitting: Nurse Practitioner

## 2022-07-17 NOTE — Telephone Encounter (Signed)
PT from [facility] order signed. Faxed back; [phone]. To be scanned-nm

## 2022-07-20 ENCOUNTER — Telehealth: Payer: Self-pay

## 2022-07-20 MED ORDER — LOPERAMIDE HCL 2 MG PO CAPS: 2.0000 mg | ORAL_CAPSULE | Freq: Four times a day (QID) | ORAL | 0 refills | Status: DC | PRN

## 2022-07-20 NOTE — Telephone Encounter (Signed)
Sent loperamide to [facility]

## 2022-07-20 NOTE — Telephone Encounter (Signed)
Pt caregiver notified we sen med to [facility]

## 2022-07-26 ENCOUNTER — Ambulatory Visit: Payer: Medicare Other | Admitting: Nurse Practitioner

## 2022-07-26 ENCOUNTER — Emergency Department: Payer: Medicare Other

## 2022-07-26 ENCOUNTER — Inpatient Hospital Stay
Admission: EM | Admit: 2022-07-26 | Discharge: 2022-07-29 | DRG: 537 | Disposition: A | Discharge status: Hospice | Payer: Medicare Other | Attending: Internal Medicine | Admitting: Internal Medicine

## 2022-07-26 ENCOUNTER — Other Ambulatory Visit: Payer: Self-pay

## 2022-07-26 DIAGNOSIS — F411 Generalized anxiety disorder: Secondary | ICD-10-CM | POA: Diagnosis present

## 2022-07-26 DIAGNOSIS — I482 Chronic atrial fibrillation, unspecified: Secondary | ICD-10-CM | POA: Diagnosis present

## 2022-07-26 DIAGNOSIS — R262 Difficulty in walking, not elsewhere classified: Secondary | ICD-10-CM | POA: Diagnosis present

## 2022-07-26 DIAGNOSIS — Z85828 Personal history of other malignant neoplasm of skin: Secondary | ICD-10-CM | POA: Diagnosis not present

## 2022-07-26 DIAGNOSIS — Z9841 Cataract extraction status, right eye: Secondary | ICD-10-CM | POA: Diagnosis not present

## 2022-07-26 DIAGNOSIS — Z8249 Family history of ischemic heart disease and other diseases of the circulatory system: Secondary | ICD-10-CM | POA: Diagnosis not present

## 2022-07-26 DIAGNOSIS — K589 Irritable bowel syndrome without diarrhea: Secondary | ICD-10-CM | POA: Diagnosis present

## 2022-07-26 DIAGNOSIS — N1330 Unspecified hydronephrosis: Secondary | ICD-10-CM | POA: Diagnosis present

## 2022-07-26 DIAGNOSIS — Z880 Allergy status to penicillin: Secondary | ICD-10-CM

## 2022-07-26 DIAGNOSIS — I739 Peripheral vascular disease, unspecified: Secondary | ICD-10-CM | POA: Diagnosis present

## 2022-07-26 DIAGNOSIS — I350 Nonrheumatic aortic (valve) stenosis: Secondary | ICD-10-CM | POA: Diagnosis present

## 2022-07-26 DIAGNOSIS — Z961 Presence of intraocular lens: Secondary | ICD-10-CM | POA: Diagnosis present

## 2022-07-26 DIAGNOSIS — Z888 Allergy status to other drugs, medicaments and biological substances status: Secondary | ICD-10-CM

## 2022-07-26 DIAGNOSIS — Z515 Encounter for palliative care: Secondary | ICD-10-CM | POA: Diagnosis not present

## 2022-07-26 DIAGNOSIS — I10 Essential (primary) hypertension: Secondary | ICD-10-CM | POA: Diagnosis present

## 2022-07-26 DIAGNOSIS — R52 Pain, unspecified: Secondary | ICD-10-CM | POA: Diagnosis not present

## 2022-07-26 DIAGNOSIS — W19XXXA Unspecified fall, initial encounter: Secondary | ICD-10-CM | POA: Diagnosis present

## 2022-07-26 DIAGNOSIS — S73101A Unspecified sprain of right hip, initial encounter: Secondary | ICD-10-CM | POA: Diagnosis not present

## 2022-07-26 DIAGNOSIS — Y92009 Unspecified place in unspecified non-institutional (private) residence as the place of occurrence of the external cause: Secondary | ICD-10-CM | POA: Diagnosis not present

## 2022-07-26 DIAGNOSIS — Z7901 Long term (current) use of anticoagulants: Secondary | ICD-10-CM | POA: Diagnosis not present

## 2022-07-26 DIAGNOSIS — D649 Anemia, unspecified: Secondary | ICD-10-CM | POA: Diagnosis present

## 2022-07-26 DIAGNOSIS — Z7189 Other specified counseling: Secondary | ICD-10-CM | POA: Diagnosis not present

## 2022-07-26 DIAGNOSIS — Z9842 Cataract extraction status, left eye: Secondary | ICD-10-CM | POA: Diagnosis not present

## 2022-07-26 DIAGNOSIS — S76011A Strain of muscle, fascia and tendon of right hip, initial encounter: Secondary | ICD-10-CM | POA: Diagnosis not present

## 2022-07-26 DIAGNOSIS — Z66 Do not resuscitate: Secondary | ICD-10-CM | POA: Diagnosis present

## 2022-07-26 DIAGNOSIS — S7001XA Contusion of right hip, initial encounter: Secondary | ICD-10-CM | POA: Diagnosis present

## 2022-07-26 DIAGNOSIS — R1031 Right lower quadrant pain: Principal | ICD-10-CM

## 2022-07-26 DIAGNOSIS — Z79899 Other long term (current) drug therapy: Secondary | ICD-10-CM | POA: Diagnosis not present

## 2022-07-26 LAB — COMPREHENSIVE METABOLIC PANEL
ALT: 32 U/L (ref 0–44)
AST: 29 U/L (ref 15–41)
Albumin: 3.7 g/dL (ref 3.5–5.0)
Alkaline Phosphatase: 90 U/L (ref 38–126)
Anion gap: 9 (ref 5–15)
BUN: 39 mg/dL — ABNORMAL HIGH (ref 8–23)
CO2: 22 mmol/L (ref 22–32)
Calcium: 9.4 mg/dL (ref 8.9–10.3)
Chloride: 107 mmol/L (ref 98–111)
Creatinine, Ser: 1.19 mg/dL — ABNORMAL HIGH (ref 0.44–1.00)
GFR, Estimated: 43 mL/min — ABNORMAL LOW (ref >60)
Glucose, Bld: 98 mg/dL (ref 70–99)
Potassium: 3.8 mmol/L (ref 3.5–5.1)
Sodium: 138 mmol/L (ref 135–145)
Total Bilirubin: 1 mg/dL (ref 0.3–1.2)
Total Protein: 5.6 g/dL — ABNORMAL LOW (ref 6.5–8.1)

## 2022-07-26 LAB — URINALYSIS, ROUTINE W REFLEX MICROSCOPIC
Bilirubin Urine: NEGATIVE
Glucose, UA: NEGATIVE mg/dL
Hgb urine dipstick: NEGATIVE
Ketones, ur: NEGATIVE mg/dL
Leukocytes,Ua: NEGATIVE
Nitrite: NEGATIVE
Protein, ur: NEGATIVE mg/dL
Specific Gravity, Urine: 1.01 (ref 1.005–1.030)
pH: 7 (ref 5.0–8.0)

## 2022-07-26 LAB — CBC WITH DIFFERENTIAL/PLATELET
Abs Immature Granulocytes: 0.02 10*3/uL (ref 0.00–0.07)
Basophils Absolute: 0.1 10*3/uL (ref 0.0–0.1)
Basophils Relative: 1 %
Eosinophils Absolute: 0 10*3/uL (ref 0.0–0.5)
Eosinophils Relative: 0 %
HCT: 32.6 % — ABNORMAL LOW (ref 36.0–46.0)
Hemoglobin: 10.9 g/dL — ABNORMAL LOW (ref 12.0–15.0)
Immature Granulocytes: 0 %
Lymphocytes Relative: 15 %
Lymphs Abs: 1.1 10*3/uL (ref 0.7–4.0)
MCH: 32.3 pg (ref 26.0–34.0)
MCHC: 33.4 g/dL (ref 30.0–36.0)
MCV: 96.7 fL (ref 80.0–100.0)
Monocytes Absolute: 0.4 10*3/uL (ref 0.1–1.0)
Monocytes Relative: 6 %
Neutro Abs: 6 10*3/uL (ref 1.7–7.7)
Neutrophils Relative %: 78 %
Platelets: 217 10*3/uL (ref 150–400)
RBC: 3.37 MIL/uL — ABNORMAL LOW (ref 3.87–5.11)
RDW: 12.3 % (ref 11.5–15.5)
WBC: 7.6 10*3/uL (ref 4.0–10.5)
nRBC: 0 % (ref 0.0–0.2)

## 2022-07-26 LAB — CK: Total CK: 68 U/L (ref 38–234)

## 2022-07-26 LAB — LIPASE, BLOOD: Lipase: 29 U/L (ref 11–51)

## 2022-07-26 MED ORDER — HYDROCODONE-ACETAMINOPHEN 5-325 MG PO TABS
1.0000 | ORAL_TABLET | ORAL | Status: DC | PRN
  Filled 2022-07-26 (×2): qty 2

## 2022-07-26 MED ORDER — CLONAZEPAM 0.5 MG PO TABS
1.0000 mg | ORAL_TABLET | Freq: Every evening | ORAL | Status: DC | PRN
  Administered 2022-07-27: 1 mg via ORAL
  Filled 2022-07-26: qty 2

## 2022-07-26 MED ORDER — LOPERAMIDE HCL 2 MG PO CAPS: 2.0000 mg | ORAL_CAPSULE | Freq: Four times a day (QID) | ORAL | Status: DC | PRN

## 2022-07-26 MED ORDER — MELATONIN 5 MG PO TABS
5.0000 mg | ORAL_TABLET | Freq: Every day | ORAL | Status: DC
  Administered 2022-07-27 (×2): 5 mg via ORAL
  Filled 2022-07-26 (×2): qty 1

## 2022-07-26 MED ORDER — ACETAMINOPHEN 325 MG PO TABS: 650.0000 mg | ORAL_TABLET | Freq: Four times a day (QID) | ORAL | Status: DC | PRN

## 2022-07-26 MED ORDER — FENTANYL CITRATE PF 50 MCG/ML IJ SOSY: 50.0000 ug | PREFILLED_SYRINGE | INTRAMUSCULAR | Status: DC | PRN

## 2022-07-26 MED ORDER — MONTELUKAST SODIUM 10 MG PO TABS
10.0000 mg | ORAL_TABLET | Freq: Every day | ORAL | Status: DC
  Administered 2022-07-27: 10 mg via ORAL
  Filled 2022-07-26 (×2): qty 1

## 2022-07-26 MED ORDER — HYDROMORPHONE HCL 1 MG/ML IJ SOLN
0.5000 mg | INTRAMUSCULAR | Status: DC | PRN
  Administered 2022-07-26: 0.5 mg via INTRAVENOUS
  Filled 2022-07-26: qty 0.5

## 2022-07-26 MED ORDER — ACETAMINOPHEN 650 MG RE SUPP: 650.0000 mg | Freq: Four times a day (QID) | RECTAL | Status: DC | PRN

## 2022-07-26 MED ORDER — ONDANSETRON HCL 4 MG PO TABS: 4.0000 mg | ORAL_TABLET | Freq: Four times a day (QID) | ORAL | Status: DC | PRN

## 2022-07-26 MED ORDER — ONDANSETRON HCL 4 MG/2ML IJ SOLN: 4.0000 mg | Freq: Four times a day (QID) | INTRAMUSCULAR | Status: DC | PRN

## 2022-07-26 MED ORDER — FENTANYL CITRATE PF 50 MCG/ML IJ SOSY
50.0000 ug | PREFILLED_SYRINGE | Freq: Once | INTRAMUSCULAR | Status: AC
  Administered 2022-07-26: 50 ug via INTRAVENOUS
  Filled 2022-07-26: qty 1

## 2022-07-26 MED ORDER — ADULT MULTIVITAMIN W/MINERALS CH
1.0000 | ORAL_TABLET | Freq: Every day | ORAL | Status: DC
  Administered 2022-07-27: 1 via ORAL
  Filled 2022-07-26 (×2): qty 1

## 2022-07-26 MED ORDER — APIXABAN 2.5 MG PO TABS
2.5000 mg | ORAL_TABLET | Freq: Two times a day (BID) | ORAL | Status: DC
  Administered 2022-07-27 (×3): 2.5 mg via ORAL
  Filled 2022-07-26 (×4): qty 1

## 2022-07-26 MED ORDER — IRBESARTAN 150 MG PO TABS
300.0000 mg | ORAL_TABLET | Freq: Every day | ORAL | Status: DC
  Administered 2022-07-27: 300 mg via ORAL
  Filled 2022-07-26 (×3): qty 2

## 2022-07-26 MED ORDER — ALBUTEROL SULFATE (2.5 MG/3ML) 0.083% IN NEBU: 2.5000 mg | INHALATION_SOLUTION | Freq: Four times a day (QID) | RESPIRATORY_TRACT | Status: DC

## 2022-07-26 MED ORDER — HYDROMORPHONE HCL 1 MG/ML IJ SOLN
0.5000 mg | INTRAMUSCULAR | Status: DC | PRN
  Administered 2022-07-27 (×2): 1 mg via INTRAVENOUS
  Administered 2022-07-27 – 2022-07-28 (×2): 0.5 mg via INTRAVENOUS
  Filled 2022-07-26 (×4): qty 1

## 2022-07-26 MED ORDER — LABETALOL HCL 200 MG PO TABS
300.0000 mg | ORAL_TABLET | Freq: Two times a day (BID) | ORAL | Status: DC
  Administered 2022-07-27 (×3): 300 mg via ORAL
  Filled 2022-07-26 (×4): qty 1

## 2022-07-26 MED ORDER — SERTRALINE HCL 50 MG PO TABS
25.0000 mg | ORAL_TABLET | Freq: Every day | ORAL | Status: DC
  Administered 2022-07-27: 25 mg via ORAL
  Filled 2022-07-26 (×2): qty 1

## 2022-07-26 MED ORDER — HYDROCODONE-ACETAMINOPHEN 5-325 MG PO TABS
2.0000 | ORAL_TABLET | Freq: Once | ORAL | Status: AC
  Administered 2022-07-26: 2 via ORAL
  Filled 2022-07-26: qty 2

## 2022-07-26 MED ORDER — AMLODIPINE BESYLATE 5 MG PO TABS
5.0000 mg | ORAL_TABLET | Freq: Every day | ORAL | Status: DC
  Administered 2022-07-27: 5 mg via ORAL
  Filled 2022-07-26 (×2): qty 1

## 2022-07-26 MED ORDER — METHOCARBAMOL 1000 MG/10ML IJ SOLN
500.0000 mg | Freq: Four times a day (QID) | INTRAVENOUS | Status: DC | PRN
  Administered 2022-07-27: 500 mg via INTRAVENOUS
  Filled 2022-07-26: qty 500

## 2022-07-26 MED ORDER — IOHEXOL 300 MG/ML  SOLN
60.0000 mL | Freq: Once | INTRAMUSCULAR | Status: AC | PRN
  Administered 2022-07-26: 60 mL via INTRAVENOUS

## 2022-07-26 NOTE — Assessment & Plan Note (Signed)
Continue irbesartan and amlodipine

## 2022-07-26 NOTE — Assessment & Plan Note (Signed)
Continue loperamide

## 2022-07-26 NOTE — Assessment & Plan Note (Signed)
Chronic anticoagulation Continue apixaban and labetalol

## 2022-07-26 NOTE — ED Triage Notes (Signed)
First nurse note:Patient arrived by EMS from [facility] [place] for vaginal pain. Denies injury.   EMS reports needing x2 assistance getting from bed to stretcher. Patient given 18mg fentanyl.   20G R AC  EMS vitals: 205/90b/p 71HR 99% RA 11RR

## 2022-07-26 NOTE — Assessment & Plan Note (Signed)
Continue home sertraline and clonazepam

## 2022-07-26 NOTE — H&P (Signed)
History and Physical    Patient: [name] [id number] DOB: August 11, 1928 DOA: 07/26/2022 DOS: the patient was seen and examined on 07/26/2022 PCP: Thea Gist, NP  Patient coming from: Home  Chief Complaint:  Chief Complaint  Patient presents with   Groin Pain    HPI: [name] is a 89 y.o. female with medical history significant for A. fib on Eliquis, aortic stenosis, HTN, who resides in an independent living facility and is ambulant with walker at baseline who presents to the ED following a fall the day prior resulting in progressively worsening pain over the right hip and groin area to where she is unable to bear weight on the hip she was previously in her usual state of health, denying any recent illness.  She has had no recent cough, chest pain or shortness of breath, fever or chills, abdominal pain, nausea, vomiting, diarrhea or dysuria.  Her fall appears to be mechanical and she denied preceding lightheadedness, palpitations, shortness of breath, numbness tingling or one-sided weakness, visual disturbance headache or chest pain. ED course and data review: BP 187/91 on arrival with otherwise normal vitals.  BMI noted to be low at 16.45.  Labs at baseline and unremarkable. Lower extremity DopplerNegative for DVT CT abdomen and pelvis showed moderate left hydronephrosis without definite ureteral dilatation suggesting uteropelvic junction stenosis MRI right hip negative for fracture but consistent with right hip strain with hematoma showing the following: IMPRESSION: 1. No evidence of hip fracture or dislocation. 2. Marked intramuscular edema of the right iliopsoas muscle which is most prominent at its insertion about the lesser trochanter consistent with high-grade muscle strain/high-grade partial-thickness tear. 3. Small fluid collection measuring 1.5 x 1.5 cm suggesting small hematoma. 4. Mild edema about the origin of the right hamstring muscles suggesting  muscle strain. 5. .......  Patient was treated with fentanyl hydromorphone hydrocodone and continued to have increased pain.  Hospitalist consulted for admission.   Review of Systems: As mentioned in the history of present illness. All other systems reviewed and are negative.  Past Medical History:  Diagnosis Date   A-fib ([place])    Asthma    in past   Heart murmur    Pt states she has Heart Murmur   Hypertension    Lower leg injury 04/24/2016   08/06/17- pt reports legs still not completely healed   Renal insufficiency    10%-Right, 90%-Left (per pt)   Skin cancer    Past Surgical History:  Procedure Laterality Date   ABDOMINAL HYSTERECTOMY     CATARACT EXTRACTION W/PHACO Right 07/01/2019   Procedure: CATARACT EXTRACTION PHACO AND INTRAOCULAR LENS PLACEMENT (IOC) RIGHT 9.25 01:14.7 ;  Surgeon: Birder Robson, MD;  Location: [facility];  Service: Ophthalmology;  Laterality: Right;   CATARACT EXTRACTION W/PHACO Left 09/09/2019   Procedure: CATARACT EXTRACTION PHACO AND INTRAOCULAR LENS PLACEMENT (IOC) LEFT 8.29  01:03.3;  Surgeon: Birder Robson, MD;  Location: [facility];  Service: Ophthalmology;  Laterality: Left;   COLONOSCOPY     RENAL ARTERY STENT     TONSILLECTOMY     TOOTH EXTRACTION     Social History:  reports that she has never smoked. She has never used smokeless tobacco. She reports that she does not drink alcohol and does not use drugs.  Allergies  Allergen Reactions   Penicillin G Diarrhea   Bacitracin     Unknown    Neomycin     unknown   Penicillamine Other (See Comments)   Penicillins Diarrhea  Family History  Problem Relation Age of Onset   Hypertension Mother    Cancer Sister    Cancer Niece    Cancer Nephew     Prior to Admission medications   Medication Sig Start Date End Date Taking? Authorizing Provider  amLODipine (NORVASC) 5 MG tablet Take 5 mg by mouth daily. 10/29/21  Yes [provider]  apixaban  (ELIQUIS) 2.5 MG TABS tablet Take 1 tablet (2.5 mg total) by mouth 2 (two) times daily. 07/03/22  Yes Gollan, Kathlene November, MD  irbesartan (AVAPRO) 300 MG tablet TAKE 1/2 TABLET (150 MG TOTAL) BY MOUTH IN THE MORNING AND 1/2 TABLET AT BEDTIME. 01/25/22  Yes Abernathy, Alyssa, NP  labetalol (NORMODYNE) 300 MG tablet Take 1 tablet (300 mg total) by mouth 2 (two) times daily. 03/21/22  Yes Gollan, Kathlene November, MD  melatonin 3 MG TABS tablet Take 3 mg by mouth at bedtime. 07/03/22  Yes [provider]  montelukast (SINGULAIR) 10 MG tablet Take 1 tablet (10 mg total) by mouth daily. 01/25/22  Yes Abernathy, Yetta Flock, NP  Multiple Vitamins-Minerals (MULTIVITAMIN ADULT, MINERALS,) TABS Take 1 tablet by mouth daily. 07/18/22  Yes [provider]  predniSONE (DELTASONE) 5 MG tablet TAKE 1 TABLET BY MOUTH EVERY DAY WITH BREAKFAST 01/25/22  Yes Abernathy, Alyssa, NP  sertraline (ZOLOFT) 25 MG tablet Take 25 mg by mouth daily. 07/03/22  Yes [provider]  traMADol (ULTRAM) 50 MG tablet Take 25 mg (1/2 tablet) every morning. Can take extra 25 mg in afternoon as needed. 04/14/22  Yes [provider]  vitamin B-12 (CYANOCOBALAMIN) 1000 MCG tablet Take 1,000 mcg by mouth daily.   Yes [provider]  clonazePAM (KLONOPIN) 1 MG tablet TAKE 1/2 TO 1 TABLET BY MOUTH AT BEDTIME AS NEEDED FOR INSOMNIA 04/06/22   Jonetta Osgood, NP  loperamide (IMODIUM) 2 MG capsule Take 1 capsule (2 mg total) by mouth 4 (four) times daily as needed for diarrhea or loose stools. 07/20/22   Jonetta Osgood, NP  Polyethyl Glycol-Propyl Glycol (SYSTANE OP) Apply to eye daily.    [provider]    Physical Exam: Vitals:   07/26/22 1302 07/26/22 1304 07/26/22 1436 07/26/22 1904  BP:  (!) 187/91 (!) 168/92 (!) 154/92  Pulse:  74 74 72  Resp:  '20 18 18  '$ Temp:  97.6 F (36.4 C) 97.7 F (36.5 C) 97.7 F (36.5 C)  TempSrc:  Oral Oral Oral  SpO2:  100% 100% 100%  Weight: 38.2 kg     Height: 5'  (1.524 m)      Physical Exam Vitals and nursing note reviewed.  Constitutional:      General: She is not in acute distress. HENT:     Head: Normocephalic and atraumatic.  Cardiovascular:     Rate and Rhythm: Normal rate and regular rhythm.     Heart sounds: Normal heart sounds.  Pulmonary:     Effort: Pulmonary effort is normal.     Breath sounds: Normal breath sounds.  Abdominal:     Palpations: Abdomen is soft.     Tenderness: There is no abdominal tenderness.  Neurological:     Mental Status: Mental status is at baseline.     Labs on Admission: I have personally reviewed following labs and imaging studies  CBC: Recent Labs  Lab 07/26/22 1427  WBC 7.6  NEUTROABS 6.0  HGB 10.9*  HCT 32.6*  MCV 96.7  PLT [id number]   Basic Metabolic Panel: Recent Labs  Lab  07/26/22 1427  NA 138  K 3.8  CL 107  CO2 22  GLUCOSE 98  BUN 39*  CREATININE 1.19*  CALCIUM 9.4   GFR: Estimated Creatinine Clearance: 18.6 mL/min (A) (by C-G formula based on SCr of 1.19 mg/dL (H)). Liver Function Tests: Recent Labs  Lab 07/26/22 1427  AST 29  ALT 32  ALKPHOS 90  BILITOT 1.0  PROT 5.6*  ALBUMIN 3.7   Recent Labs  Lab 07/26/22 1427  LIPASE 29   No results for input(s): "AMMONIA" in the last 168 hours. Coagulation Profile: No results for input(s): "INR", "PROTIME" in the last 168 hours. Cardiac Enzymes: Recent Labs  Lab 07/26/22 1427  CKTOTAL 68   BNP (last 3 results) No results for input(s): "PROBNP" in the last 8760 hours. HbA1C: No results for input(s): "HGBA1C" in the last 72 hours. CBG: No results for input(s): "GLUCAP" in the last 168 hours. Lipid Profile: No results for input(s): "CHOL", "HDL", "LDLCALC", "TRIG", "CHOLHDL", "LDLDIRECT" in the last 72 hours. Thyroid Function Tests: No results for input(s): "TSH", "T4TOTAL", "FREET4", "T3FREE", "THYROIDAB" in the last 72 hours. Anemia Panel: No results for input(s): "VITAMINB12", "FOLATE", "FERRITIN", "TIBC",  "IRON", "RETICCTPCT" in the last 72 hours. Urine analysis:    Component Value Date/Time   COLORURINE YELLOW (A) 07/26/2022 1427   APPEARANCEUR HAZY (A) 07/26/2022 1427   APPEARANCEUR Clear 01/24/2021 1517   LABSPEC 1.010 07/26/2022 1427   PHURINE 7.0 07/26/2022 1427   GLUCOSEU NEGATIVE 07/26/2022 1427   HGBUR NEGATIVE 07/26/2022 1427   BILIRUBINUR NEGATIVE 07/26/2022 1427   BILIRUBINUR Negative 01/24/2021 1517   KETONESUR NEGATIVE 07/26/2022 1427   PROTEINUR NEGATIVE 07/26/2022 1427   UROBILINOGEN 0.2 05/17/2018 1226   NITRITE NEGATIVE 07/26/2022 1427   LEUKOCYTESUR NEGATIVE 07/26/2022 1427    Radiological Exams on Admission: MR HIP RIGHT WO CONTRAST  Result Date: 07/26/2022 CLINICAL DATA:  Hip trauma, fracture suspected EXAM: MR OF THE RIGHT HIP WITHOUT CONTRAST TECHNIQUE: Multiplanar, multisequence MR imaging was performed. No intravenous contrast was administered. COMPARISON:  CT examination dated July 26, 2022 FINDINGS: Bone No hip fracture, dislocation or avascular necrosis. No aggressive osseous lesion. SI joints are normal. No SI joint widening or erosive changes. Marked levoscoliosis of the lumbar spine centered at L3 vertebral body Alignment Normal. No subluxation. Dysplasia None. Joint effusion No joint effusions. Labrum Advanced degeneration/diminution of the labrum. Cartilage Near complete loss of the cartilage with subchondral cystic changes about the superolateral aspect of the hip joint, chronic process Capsule and ligaments Normal. Muscles and Tendons There is marked intramuscular edema of the right iliopsoas muscle which is severe about its insertion about the lesser trochanter. Small fluid collection measuring a proximally 1.5 x 1.5 cm suggesting small hematoma. There is also edema about the origin of the right hamstring muscles. Mild edema along the right gluteus medius/minimus insertion Other Findings No bursal fluid. Viscera No abnormality seen in pelvis. No  lymphadenopathy. No free fluid in the pelvis. IMPRESSION: 1. No evidence of hip fracture or dislocation. 2. Marked intramuscular edema of the right iliopsoas muscle which is most prominent at its insertion about the lesser trochanter consistent with high-grade muscle strain/high-grade partial-thickness tear. 3. Small fluid collection measuring 1.5 x 1.5 cm suggesting small hematoma. 4. Mild edema about the origin of the right hamstring muscles suggesting muscle strain. 5. Mild-to-moderate right hip osteoarthritis with advanced degeneration of the labrum and near complete loss of articular cartilage with subchondral cystic changes. 6. Marked levoscoliosis of the lumbar spine centered at  L3 vertebral body. Electronically Signed   By: Keane Police D.O.   On: 07/26/2022 22:14   CT ABDOMEN PELVIS W CONTRAST  Result Date: 07/26/2022 CLINICAL DATA:  Acute pelvic pain. EXAM: CT ABDOMEN AND PELVIS WITH CONTRAST TECHNIQUE: Multidetector CT imaging of the abdomen and pelvis was performed using the standard protocol following bolus administration of intravenous contrast. RADIATION DOSE REDUCTION: This exam was performed according to the departmental dose-optimization program which includes automated exposure control, adjustment of the mA and/or kV according to patient size and/or use of iterative reconstruction technique. CONTRAST:  3m OMNIPAQUE IOHEXOL 300 MG/ML  SOLN COMPARISON:  None Available. FINDINGS: Lower chest: No acute abnormality. Hepatobiliary: No focal liver abnormality is seen. No gallstones, gallbladder wall thickening, or biliary dilatation. Pancreas: Unremarkable. No pancreatic ductal dilatation or surrounding inflammatory changes. Spleen: Normal in size without focal abnormality. Adrenals/Urinary Tract: Adrenal glands appear normal. Severe right renal atrophy is noted. Moderate left hydronephrosis is noted although no definite ureteral dilatation is noted, suggesting ureteropelvic junction stenosis.  Urinary bladder is unremarkable. Stomach/Bowel: The stomach appears normal. There is no evidence of bowel obstruction or inflammation. Vascular/Lymphatic: Aortic atherosclerosis. No enlarged abdominal or pelvic lymph nodes. Probable calcified 11 mm distal splenic artery aneurysm is noted. Reproductive: Status post hysterectomy. No adnexal masses. Other: No abdominal wall hernia or abnormality. No abdominopelvic ascites. Musculoskeletal: Moderate levoscoliosis of lumbar spine is noted. No fracture or dislocation is noted. IMPRESSION: Moderate left hydronephrosis is noted without definite ureteral dilatation, suggesting ureteropelvic junction stenosis. Severe right renal atrophy is noted. Probable calcified 11 mm distal splenic artery aneurysm is noted. Aortic Atherosclerosis (ICD10-I70.0). Electronically Signed   By: JMarijo ConceptionM.D.   On: 07/26/2022 15:49   UKoreaVenous Img Lower Unilateral Right  Result Date: 07/26/2022 CLINICAL DATA:  Right lower extremity pain. EXAM: Right LOWER EXTREMITY VENOUS DOPPLER ULTRASOUND TECHNIQUE: Gray-scale sonography with compression, as well as color and duplex ultrasound, were performed to evaluate the deep venous system(s) from the level of the common femoral vein through the popliteal and proximal calf veins. COMPARISON:  April 22, 2016. FINDINGS: VENOUS Normal compressibility of the common femoral, superficial femoral, and popliteal veins, as well as the visualized calf veins. Visualized portions of profunda femoral vein and great saphenous vein unremarkable. No filling defects to suggest DVT on grayscale or color Doppler imaging. Doppler waveforms show normal direction of venous flow, normal respiratory plasticity and response to augmentation. Limited views of the contralateral common femoral vein are unremarkable. OTHER None. Limitations: Calf veins are not well visualized due to overlying ulcerations. IMPRESSION: No definite evidence of deep venous thrombosis seen in  right lower extremity. Electronically Signed   By: JMarijo ConceptionM.D.   On: 07/26/2022 15:10     Data Reviewed: Relevant notes from primary care and specialist visits, past discharge summaries as available in EHR, including Care Everywhere. Prior diagnostic testing as pertinent to current admission diagnoses Updated medications and problem lists for reconciliation ED course, including vitals, labs, imaging, treatment and response to treatment Triage notes, nursing and pharmacy notes and ED provider's notes Notable results as noted in HPI   Assessment and Plan: Sprain hip/thigh, right, ( high-grade muscle strain/high-grade partial-thickness tear psoas) Fall at home Ambulatory dysfunction Pain control PT and TOC consult  Atrial fibrillation, chronic (HCC) Chronic anticoagulation Continue apixaban and labetalol  Hypertension Continue irbesartan and amlodipine  IBS (irritable bowel syndrome) Continue loperamide  GAD (generalized anxiety disorder) Continue home sertraline and clonazepam  DVT prophylaxis: Apixaban  Consults: none  Advance Care Planning: DNR  Family Communication: daughter at bedside  Disposition Plan: Back to previous home environment  Severity of Illness: The appropriate patient status for this patient is INPATIENT. Inpatient status is judged to be reasonable and necessary in order to provide the required intensity of service to ensure the patient's safety. The patient's presenting symptoms, physical exam findings, and initial radiographic and laboratory data in the context of their chronic comorbidities is felt to place them at high risk for further clinical deterioration. Furthermore, it is not anticipated that the patient will be medically stable for discharge from the hospital within 2 midnights of admission.   * I certify that at the point of admission it is my clinical judgment that the patient will require inpatient hospital care spanning beyond  2 midnights from the point of admission due to high intensity of service, high risk for further deterioration and high frequency of surveillance required.*  Author: Athena Masse, MD 07/26/2022 11:25 PM  For on call review [email or web address].

## 2022-07-26 NOTE — ED Notes (Signed)
Placed pt on continuous pulse ox

## 2022-07-26 NOTE — ED Triage Notes (Signed)
Pt here with her daughter c/o groin pain for a while but states it became severe today. Pt states the pain comes and goes but lately it has been consistent. Pt given pain meds by ems but pain still persists.

## 2022-07-26 NOTE — ED Provider Notes (Signed)
[facility] Provider Note    Event Date/Time   First MD Initiated Contact with Patient 07/26/22 1354     (approximate)   History   Groin Pain   HPI  [name] is a 89 y.o. female with history of hypertension, peripheral vascular disease who comes in with concerns for right groin pain.  Started to figure out exactly where her pain is at.  She got some fentanyl with EMS and so her pain seems to be more right groin in her right labial area does not really seem to be abdominal in nature.  She denies any pain in her vagina.  She does report pain when she tries to lift up her right leg.  No obvious swelling but she does have some chronic changes to her right leg from prior accident.  She denies any falls hitting her head or any other concerns.  She is been ambulatory with her walker.  Family is at bedside.   Physical Exam   Triage Vital Signs: ED Triage Vitals  Enc Vitals Group     BP 07/26/22 1304 (!) 187/91     Pulse Rate 07/26/22 1304 74     Resp 07/26/22 1304 20     Temp 07/26/22 1304 97.6 F (36.4 C)     Temp Source 07/26/22 1304 Oral     SpO2 07/26/22 1304 100 %     Weight 07/26/22 1302 84 lb 3.5 oz (38.2 kg)     Height 07/26/22 1302 5' (1.524 m)     Head Circumference --      Peak Flow --      Pain Score 07/26/22 1302 10     Pain Loc --      Pain Edu? --      Excl. in [place]? --     Most recent vital signs: Vitals:   07/26/22 1304  BP: (!) 187/91  Pulse: 74  Resp: 20  Temp: 97.6 F (36.4 C)  SpO2: 100%     General: Awake, no distress.  CV:  Good peripheral perfusion.  Resp:  Normal effort.  Abd:  No distention.  Other:  Patient has some skin discoloration is noted to the right leg that is baseline.  Good good distal pulse.  She has got some pain when trying to lift up her right leg.  She seems to be slightly tender in the right groin area without any obvious abscess.  A vaginal exam was performed and externally without any obvious  abscess.  She denied any inner vaginal pain but pelvic exam was not performed.  Denies any vaginal bleeding or vaginal discharge.   ED Results / Procedures / Treatments   Labs (all labs ordered are listed, but only abnormal results are displayed) Labs Reviewed - No data to display  RADIOLOGY Pending   PROCEDURES:  Critical Care performed: No  Procedures   MEDICATIONS ORDERED IN ED: Medications - No data to display   IMPRESSION / MDM / [place] / ED COURSE  I reviewed the triage vital signs and the nursing notes.   Patient's presentation is most consistent with acute presentation with potential threat to life or bodily function.   Patient comes in with some right groin pain.  Patient did just get fentanyl so exam is little unreliable of exactly where the pain is at.  Will get CT scan to further evaluate for deeper infection no obvious external abscess.  She does have some limited ability to lift  up the leg but she denies any falls or hitting it so pelvic fracture seems less likely.  Will get ultrasound as well to make sure there is no DVT in it.  Good distal pulses likely vascular issue.  Patient be handed off to oncoming team pending these results    FINAL CLINICAL IMPRESSION(S) / ED DIAGNOSES   Final diagnoses:  Groin pain, right     Rx / DC Orders   ED Discharge Orders     None        Note:  This document was prepared using Dragon voice recognition software and may include unintentional dictation errors.   Vanessa Ellsworth, MD 07/26/22 [phone]

## 2022-07-26 NOTE — Assessment & Plan Note (Signed)
Fall at home Ambulatory dysfunction Pain control PT and TOC consult

## 2022-07-27 ENCOUNTER — Encounter: Payer: Self-pay | Admitting: Internal Medicine

## 2022-07-27 DIAGNOSIS — R1031 Right lower quadrant pain: Secondary | ICD-10-CM | POA: Diagnosis not present

## 2022-07-27 DIAGNOSIS — Y92009 Unspecified place in unspecified non-institutional (private) residence as the place of occurrence of the external cause: Secondary | ICD-10-CM | POA: Diagnosis not present

## 2022-07-27 DIAGNOSIS — W19XXXA Unspecified fall, initial encounter: Secondary | ICD-10-CM

## 2022-07-27 DIAGNOSIS — S73101A Unspecified sprain of right hip, initial encounter: Secondary | ICD-10-CM

## 2022-07-27 MED ORDER — HYDROXYZINE HCL 10 MG PO TABS
10.0000 mg | ORAL_TABLET | Freq: Three times a day (TID) | ORAL | Status: DC | PRN
  Filled 2022-07-27: qty 1

## 2022-07-27 MED ORDER — ALBUTEROL SULFATE (2.5 MG/3ML) 0.083% IN NEBU: 2.5000 mg | INHALATION_SOLUTION | Freq: Four times a day (QID) | RESPIRATORY_TRACT | Status: DC | PRN

## 2022-07-27 NOTE — Progress Notes (Addendum)
PT Cancellation Note  Patient Details Name: [name] MRN: [id number] DOB: 1929/05/28   Cancelled Treatment:    Reason Eval/Treat Not Completed: Patient declined, no reason specified On introduction as PT pt states "I don't like PT."  PT talked with pt and daughter encouraging some mobility, possibly getting to recliner for breakfast but ultimately she refused all attempts at activity with PT this AM.  Will maintain on caseload and attempt to see as pt is appropriate and willing to participate.    Addendum: attempted again at ~1500 and pt was out of room for procedure; will maintain on caseload and attempt to eval when pt available, appropriate and willing to participate.  Kreg Shropshire, DPT 07/27/2022, 11:03 AM

## 2022-07-27 NOTE — Progress Notes (Signed)
PROGRESS NOTE [name]  [id number] DOB: [birth year] DOA: 07/26/2022 PCP: Thea Gist, NP   Brief Narrative/Hospital Course: 89 y.o.f whx of A.fib on Eliquis, aortic stenosis, HTN,who resides in an ILF and is ambulant with walker at baseline who presents to the ED following a fall the day prior resulting in progressively worsening pain over the right hip and groin area to where she is unable to bear weight on the hip. Seen in the ED vitals with uncontrolled blood pressure labs fairly stable low BMI 16.4 Lower extremity DopplerNegative for DVT CT abd/Pelvis showed moderate left hydronephrosis without definite ureteral dilatation suggesting uteropelvic junction stenosis MRI Rt [id number] for fracture but consistent with right hip strain with hematoma: Marked intramuscular edema of the right iliopsoas muscle which is most prominent at its insertion about the lesser trochanter consistent with high-grade muscle strain/high-grade partial-thickness tear.Small fluid collection measuring 1.5 x 1.5 cm suggesting small hematoma.Mild edema about the origin of the right hamstring muscles suggesting muscle strain. Patient was treated with fentanyl hydromorphone hydrocodone and continued to have increased pain Patient was admitted for further management    Subjective: Seen and examined.  Resting comfortably About to have her breakfast, upset with PT OT Daughter report "She loathes PTOT at ALF"   Assessment and Plan: Active Problems:   Sprain hip/thigh, right, ( high-grade muscle strain/high-grade partial-thickness tear psoas)   Fall at home, initial encounter   Ambulatory dysfunction   GAD (generalized anxiety disorder)   IBS (irritable bowel syndrome)   Peripheral vascular disease (HCC)   Hypertension   Atrial fibrillation, chronic (HCC)   Chronic anticoagulation  Sprain hip/thigh on right secondary to fall High-grade muscle strain/high-grade partial-thickness tear psoas Fall at  home Ambulatory dysfunction: Continue pain management muscle relaxant, consult PT OT.  Likely needs placement.  Chronic atrial fibrillation on Eliquis and labetalol.  Continue HTN:BP poorly controlled on present send currently stable, on amlodipine, Avapro, labetalol. Irritable bowel syndrome continue loperamide GAD continue sertraline and clonazepam Mild normocytic anemia stable outpatient follow-up Lower extremity hyperkeratosis/skin changes/skin cancer per daughter Moderate left hydronephrosis without definite ureteral dilatation suggesting uteropelvic junction stenosis-incidentally noted on CT, will need outpatient urology follow-up-UA unremarkable, has normal creatinine  GOC; DNR.  It seems patient may be refusing PT OT.  Daughter reports her dermatologist advised palliative care evaluation. With ongoing refusal and pain control will obtain palliative care eval.  DVT prophylaxis: apixaban (ELIQUIS) tablet 2.5 mg Start: 07/27/22 0100 Code Status:   Code Status: DNR Family Communication: plan of care discussed with patient/daughter at bedside. Patient status is: Inpatient because of hip pain, pain management PT OT evaluation Level of care: Med-Surg   Dispo: The patient is from: ALF            Anticipated disposition: TBD Objective: Vitals last 24 hrs: Vitals:   07/27/22 0001 07/27/22 0053 07/27/22 0406 07/27/22 0905  BP: (!) 139/97 (!) 178/75 (!) 121/51 (!) 178/66  Pulse: 82 67 (!) 58 65  Resp: '18 18 16 14  '$ Temp: 97.8 F (36.6 C) (!) 97.5 F (36.4 C)  97.9 F (36.6 C)  TempSrc: Oral Oral    SpO2: 97% 99% 100% 99%  Weight:  41.1 kg    Height:  5' (1.524 m)     Weight change:   Physical Examination: General exam: alert awake, older than stated age HEENT:Oral mucosa moist, Ear/Nose WNL grossly Respiratory system: bilaterally CLEAR BS, no use of accessory muscle Cardiovascular system: S1 & S2 +, No JVD. Gastrointestinal system: Abdomen soft,NT,ND,  BS+ Nervous System:Alert,  awake, moving extremities. Extremities: LE edema w/ hyperkeratotic skin changes?  Skin cancer Skin: No rashes,no icterus. MSK: Normal muscle bulk,tone, power  Medications reviewed:  Scheduled Meds:  amLODipine  5 mg Oral Daily   apixaban  2.5 mg Oral BID   irbesartan  300 mg Oral Daily   labetalol  300 mg Oral BID   melatonin  5 mg Oral QHS   montelukast  10 mg Oral Daily   multivitamin with minerals  1 tablet Oral Daily   sertraline  25 mg Oral Daily   Continuous Infusions:  methocarbamol (ROBAXIN) IV        Diet Order             Diet regular Room service appropriate? Yes; Fluid consistency: Thin  Diet effective now                  No intake or output data in the 24 hours ending 07/27/22 1026 Net IO Since Admission: No IO data has been entered for this period [07/27/22 1026]  Wt Readings from Last 3 Encounters:  07/27/22 41.1 kg  06/01/22 38.2 kg  04/25/22 38.6 kg     Unresulted Labs (From admission, onward)    None     Data Reviewed: I have personally reviewed following labs and imaging studies CBC: Recent Labs  Lab 07/26/22 1427  WBC 7.6  NEUTROABS 6.0  HGB 10.9*  HCT 32.6*  MCV 96.7  PLT [id number]   Basic Metabolic Panel: Recent Labs  Lab 07/26/22 1427  NA 138  K 3.8  CL 107  CO2 22  GLUCOSE 98  BUN 39*  CREATININE 1.19*  CALCIUM 9.4   GFR: Estimated Creatinine Clearance: 20 mL/min (A) (by C-G formula based on SCr of 1.19 mg/dL (H)). Liver Function Tests: Recent Labs  Lab 07/26/22 1427  AST 29  ALT 32  ALKPHOS 90  BILITOT 1.0  PROT 5.6*  ALBUMIN 3.7  Radiology Studies: MR HIP RIGHT WO CONTRAST  Result Date: 07/26/2022 CLINICAL DATA:  Hip trauma, fracture suspected EXAM: MR OF THE RIGHT HIP WITHOUT CONTRAST TECHNIQUE: Multiplanar, multisequence MR imaging was performed. No intravenous contrast was administered. COMPARISON:  CT examination dated July 26, 2022 FINDINGS: Bone No hip fracture, dislocation or avascular necrosis. No  aggressive osseous lesion. SI joints are normal. No SI joint widening or erosive changes. Marked levoscoliosis of the lumbar spine centered at L3 vertebral body Alignment Normal. No subluxation. Dysplasia None. Joint effusion No joint effusions. Labrum Advanced degeneration/diminution of the labrum. Cartilage Near complete loss of the cartilage with subchondral cystic changes about the superolateral aspect of the hip joint, chronic process Capsule and ligaments Normal. Muscles and Tendons There is marked intramuscular edema of the right iliopsoas muscle which is severe about its insertion about the lesser trochanter. Small fluid collection measuring a proximally 1.5 x 1.5 cm suggesting small hematoma. There is also edema about the origin of the right hamstring muscles. Mild edema along the right gluteus medius/minimus insertion Other Findings No bursal fluid. Viscera No abnormality seen in pelvis. No lymphadenopathy. No free fluid in the pelvis. IMPRESSION: 1. No evidence of hip fracture or dislocation. 2. Marked intramuscular edema of the right iliopsoas muscle which is most prominent at its insertion about the lesser trochanter consistent with high-grade muscle strain/high-grade partial-thickness tear. 3. Small fluid collection measuring 1.5 x 1.5 cm suggesting small hematoma. 4. Mild edema about the origin of the right hamstring muscles suggesting muscle strain. 5. Mild-to-moderate  right hip osteoarthritis with advanced degeneration of the labrum and near complete loss of articular cartilage with subchondral cystic changes. 6. Marked levoscoliosis of the lumbar spine centered at L3 vertebral body. Electronically Signed   By: Keane Police D.O.   On: 07/26/2022 22:14   CT ABDOMEN PELVIS W CONTRAST  Result Date: 07/26/2022 CLINICAL DATA:  Acute pelvic pain. EXAM: CT ABDOMEN AND PELVIS WITH CONTRAST TECHNIQUE: Multidetector CT imaging of the abdomen and pelvis was performed using the standard protocol following  bolus administration of intravenous contrast. RADIATION DOSE REDUCTION: This exam was performed according to the departmental dose-optimization program which includes automated exposure control, adjustment of the mA and/or kV according to patient size and/or use of iterative reconstruction technique. CONTRAST:  62m OMNIPAQUE IOHEXOL 300 MG/ML  SOLN COMPARISON:  None Available. FINDINGS: Lower chest: No acute abnormality. Hepatobiliary: No focal liver abnormality is seen. No gallstones, gallbladder wall thickening, or biliary dilatation. Pancreas: Unremarkable. No pancreatic ductal dilatation or surrounding inflammatory changes. Spleen: Normal in size without focal abnormality. Adrenals/Urinary Tract: Adrenal glands appear normal. Severe right renal atrophy is noted. Moderate left hydronephrosis is noted although no definite ureteral dilatation is noted, suggesting ureteropelvic junction stenosis. Urinary bladder is unremarkable. Stomach/Bowel: The stomach appears normal. There is no evidence of bowel obstruction or inflammation. Vascular/Lymphatic: Aortic atherosclerosis. No enlarged abdominal or pelvic lymph nodes. Probable calcified 11 mm distal splenic artery aneurysm is noted. Reproductive: Status post hysterectomy. No adnexal masses. Other: No abdominal wall hernia or abnormality. No abdominopelvic ascites. Musculoskeletal: Moderate levoscoliosis of lumbar spine is noted. No fracture or dislocation is noted. IMPRESSION: Moderate left hydronephrosis is noted without definite ureteral dilatation, suggesting ureteropelvic junction stenosis. Severe right renal atrophy is noted. Probable calcified 11 mm distal splenic artery aneurysm is noted. Aortic Atherosclerosis (ICD10-I70.0). Electronically Signed   By: JMarijo ConceptionM.D.   On: 07/26/2022 15:49   UKoreaVenous Img Lower Unilateral Right  Result Date: 07/26/2022 CLINICAL DATA:  Right lower extremity pain. EXAM: Right LOWER EXTREMITY VENOUS DOPPLER ULTRASOUND  TECHNIQUE: Gray-scale sonography with compression, as well as color and duplex ultrasound, were performed to evaluate the deep venous system(s) from the level of the common femoral vein through the popliteal and proximal calf veins. COMPARISON:  April 22, 2016. FINDINGS: VENOUS Normal compressibility of the common femoral, superficial femoral, and popliteal veins, as well as the visualized calf veins. Visualized portions of profunda femoral vein and great saphenous vein unremarkable. No filling defects to suggest DVT on grayscale or color Doppler imaging. Doppler waveforms show normal direction of venous flow, normal respiratory plasticity and response to augmentation. Limited views of the contralateral common femoral vein are unremarkable. OTHER None. Limitations: Calf veins are not well visualized due to overlying ulcerations. IMPRESSION: No definite evidence of deep venous thrombosis seen in right lower extremity. Electronically Signed   By: JMarijo ConceptionM.D.   On: 07/26/2022 15:10     LOS: 1 day   RAntonieta Pert MD [facility]  07/27/2022, 10:26 AM

## 2022-07-27 NOTE — Progress Notes (Signed)
Patient refused to get Hydrocodone - acetaminophen. So I gave dilaudid for pain.

## 2022-07-27 NOTE — Progress Notes (Signed)
Patient very confused, agitated and hitting at staff members. Patient appears to be quite paranoid, frequently stating "I don't trust you people." Patient screaming out. C/o HA and pain in left hip. Patient repositioned and medicated per [facility]

## 2022-07-27 NOTE — Plan of Care (Signed)
GOC consult noted. Daughter is on phone at bedside. Discussed meeting in the morning at 10:00.

## 2022-07-27 NOTE — Evaluation (Signed)
Occupational Therapy Evaluation Patient Details Name: [name] MRN: [id number] DOB: 1928-10-06 Today's Date: 07/27/2022   History of Present Illness [name] is a 89 y.o. female with medical history significant for A. fib on Eliquis, aortic stenosis, HTN, who resides in an independent living facility and is ambulant with walker at baseline who presents to the ED following a fall the day prior resulting in progressively worsening pain over the right hip and groin area to where she is unable to bear weight on the hip. MRI negative for fracture but consistent with right hip strain with hematoma   Clinical Impression   [name] was seen for OT evaluation this date. Prior to hospital admission, pt was MOD I using RW. Pt lives at [facility]. Pt presents to acute OT demonstrating impaired ADL performance and functional mobility 2/2 decreased activity tolerance and functional strength/ROM/balance deficits. Pt currently requires TOTAL A for bed mobility. CGA hair brushing seated EOB, pt leaning heavily on LUE to offload R hip. Tolerates ~7 min sitting EOB. Once returned to supine pt yells in pain with eyes closed saying "quit it" when therapist is standing across the room. RN notified of pain. Pt would benefit from trial of skilled OT. Upon hospital discharge, recommend STR.    Recommendations for follow up therapy are one component of a multi-disciplinary discharge planning process, led by the attending physician.  Recommendations may be updated based on patient status, additional functional criteria and insurance authorization.   Follow Up Recommendations  Skilled nursing-short term rehab (<3 hours/day) (trial)     Assistance Recommended at Discharge Frequent or constant Supervision/Assistance  Patient can return home with the following Two people to help with walking and/or transfers;Two people to help with bathing/dressing/bathroom;Help with stairs or ramp for entrance    Functional  Status Assessment  Patient has had a recent decline in their functional status and demonstrates the ability to make significant improvements in function in a reasonable and predictable amount of time.  Equipment Recommendations  Other (comment) (defer)    Recommendations for Other Services       Precautions / Restrictions Precautions Precautions: Fall Restrictions Weight Bearing Restrictions: No      Mobility Bed Mobility Overal bed mobility: Needs Assistance Bed Mobility: Supine to Sit, Sit to Supine     Supine to sit: Total assist Sit to supine: Total assist        Transfers                   General transfer comment: unable to tolerate pain      Balance Overall balance assessment: Needs assistance Sitting-balance support: Single extremity supported, Feet supported Sitting balance-Leahy Scale: Poor   Postural control: Left lateral lean                                 ADL either performed or assessed with clinical judgement   ADL Overall ADL's : Needs assistance/impaired                                       General ADL Comments: CGA hair brushing seated EOB, pt leaning heavily on LUE to offload R hip. SETUP self-feeding at bed level.      Pertinent Vitals/Pain Pain Assessment Pain Assessment: Faces Faces Pain Scale: Hurts whole lot Pain Location: R hip Pain Descriptors /  Indicators: Dull, Grimacing, Guarding Pain Intervention(s): Limited activity within patient's tolerance, Repositioned, Patient requesting pain meds-RN notified     Hand Dominance Right   Extremity/Trunk Assessment Upper Extremity Assessment Upper Extremity Assessment: Generalized weakness   Lower Extremity Assessment Lower Extremity Assessment: Generalized weakness       Communication Communication Communication: HOH   Cognition Arousal/Alertness: Awake/alert Behavior During Therapy: Flat affect, Agitated Overall Cognitive Status:  Impaired/Different from baseline                                 General Comments: pt yells in pain with eyes closed saying "quit it" when therapist is standing across the room. Repeatedly comments on the long cat tail outside despite family stating she is looking at tree branches      [name] expects to be discharged to:: Assisted living                             Home Equipment: Conservation officer, nature (2 wheels)          Prior Functioning/Environment Prior Level of Function : Independent/Modified Independent             Mobility Comments: assist for meals, walks to dining hall daily          OT Problem List: Decreased strength;Decreased range of motion;Decreased activity tolerance;Impaired balance (sitting and/or standing);Decreased safety awareness      OT Treatment/Interventions: Self-care/ADL training;Therapeutic exercise;DME and/or AE instruction;Energy conservation;Therapeutic activities;Patient/family education;Balance training    OT Goals(Current goals can be found in the care plan section) Acute Rehab OT Goals Patient Stated Goal: to be left alone OT Goal Formulation: With patient Time For Goal Achievement: 08/10/22 Potential to Achieve Goals: Good ADL Goals Pt Will Perform Grooming: with set-up;with supervision;sitting Pt Will Perform Lower Body Dressing: with mod assist;sitting/lateral leans Pt Will Transfer to Toilet: with mod assist;squat pivot transfer;bedside commode  OT Frequency: Min 1X/week    Co-evaluation              AM-PAC OT "6 Clicks" Daily Activity     Outcome Measure Help from another person eating meals?: None Help from another person taking care of personal grooming?: A Little Help from another person toileting, which includes using toliet, bedpan, or urinal?: A Lot Help from another person bathing (including washing, rinsing, drying)?: A Lot Help from another person to put on and taking off  regular upper body clothing?: A Lot Help from another person to put on and taking off regular lower body clothing?: A Lot 6 Click Score: 15   End of Session Nurse Communication: Patient requests pain meds  Activity Tolerance: Patient limited by pain Patient left: in bed;with call bell/phone within reach;with family/visitor present  OT Visit Diagnosis: Other abnormalities of gait and mobility (R26.89);Muscle weakness (generalized) (M62.81)                Time: [id number] OT Time Calculation (min): 24 min Charges:  OT General Charges $OT Visit: 1 Visit OT Evaluation $OT Eval Low Complexity: 1 Low OT Treatments $Self Care/Home Management : 8-22 mins  Dessie Coma, M.S. OTR/L  07/27/22, 2:05 PM  ascom [phone]

## 2022-07-27 NOTE — Progress Notes (Signed)
Patient moved to 120, private room.

## 2022-07-27 NOTE — Hospital Course (Addendum)
89 y.o.f whx of A.fib on Eliquis, aortic stenosis, HTN,who resides in an ILF and is ambulant with walker at baseline who presents to the ED following a fall the day prior resulting in progressively worsening pain over the right hip and groin area to where she is unable to bear weight on the hip. Seen in the ED vitals with uncontrolled blood pressure labs fairly stable low BMI 16.4 Lower extremity DopplerNegative for DVT CT abd/Pelvis showed moderate left hydronephrosis without definite ureteral dilatation suggesting uteropelvic junction stenosis MRI Rt [id number] for fracture but consistent with right hip strain with hematoma: Marked intramuscular edema of the right iliopsoas muscle which is most prominent at its insertion about the lesser trochanter consistent with high-grade muscle strain/high-grade partial-thickness tear.Small fluid collection measuring 1.5 x 1.5 cm suggesting small hematoma.Mild edema about the origin of the right hamstring muscles suggesting muscle strain. Patient was treated with fentanyl hydromorphone hydrocodone and continued to have increased pain Patient was admitted for further management

## 2022-07-27 NOTE — IPAL (Signed)
  Interdisciplinary Goals of Care Family Meeting   Date carried out: 07/27/2022  Location of the meeting: Bedside  Member's involved: Physician and Family Member or next of kin daughter  Durable Power of Attorney or acting medical decision maker: daughter, Ames Coupe    Discussion: We discussed goals of care for [employer] .   I have reviewed medical records including EPIC notes, labs and imaging, assessed the patient and then met with patient and daughter to discuss major active diagnoses, plan of care, natural trajectory, prognosis, GOC, EOL wishes, disposition and options including Full code/DNI/DNR and the concept of comfort care if DNR is elected. Questions and concerns were addressed. They are in agreement to continue current plan of care . Election for DNR status.   Code status:   Code Status: DNR   Disposition: Continue current acute care  Time spent for the meeting: [place], MD  07/27/2022, 1:58 AM

## 2022-07-27 NOTE — Plan of Care (Signed)
GOC consult noted. Patient is currently in a semi private room at this time. Due to the nature of [place] conversations and the need for a quiet atmosphere, without distractions for extended periods of time, PMT will be happy to have full [place] discussion when patient is in a private room.

## 2022-07-28 DIAGNOSIS — Z7189 Other specified counseling: Secondary | ICD-10-CM | POA: Diagnosis not present

## 2022-07-28 DIAGNOSIS — F411 Generalized anxiety disorder: Secondary | ICD-10-CM

## 2022-07-28 DIAGNOSIS — I10 Essential (primary) hypertension: Secondary | ICD-10-CM

## 2022-07-28 DIAGNOSIS — R262 Difficulty in walking, not elsewhere classified: Secondary | ICD-10-CM

## 2022-07-28 DIAGNOSIS — S73101A Unspecified sprain of right hip, initial encounter: Secondary | ICD-10-CM | POA: Diagnosis not present

## 2022-07-28 MED ORDER — GLYCOPYRROLATE 1 MG PO TABS: 1.0000 mg | ORAL_TABLET | ORAL | 0 refills | Status: DC | PRN

## 2022-07-28 MED ORDER — LORAZEPAM 2 MG/ML IJ SOLN: 1.0000 mg | INTRAMUSCULAR | Status: DC | PRN

## 2022-07-28 MED ORDER — ACETAMINOPHEN 325 MG PO TABS: 650.0000 mg | ORAL_TABLET | Freq: Four times a day (QID) | ORAL | Status: DC | PRN

## 2022-07-28 MED ORDER — MORPHINE SULFATE (CONCENTRATE) 10 MG /0.5 ML PO SOLN: 10.0000 mg | ORAL | 0 refills | Status: DC | PRN

## 2022-07-28 MED ORDER — GLYCOPYRROLATE 1 MG PO TABS: 1.0000 mg | ORAL_TABLET | ORAL | Status: DC | PRN

## 2022-07-28 MED ORDER — HALOPERIDOL 0.5 MG PO TABS: 0.5000 mg | ORAL_TABLET | ORAL | 0 refills | Status: DC | PRN

## 2022-07-28 MED ORDER — BIOTENE DRY MOUTH MT LIQD: 15.0000 mL | OROMUCOSAL | Status: DC | PRN

## 2022-07-28 MED ORDER — LORAZEPAM 2 MG/ML PO CONC
1.0000 mg | ORAL | Status: DC | PRN
  Administered 2022-07-29: 1 mg via SUBLINGUAL
  Filled 2022-07-28: qty 1

## 2022-07-28 MED ORDER — LORAZEPAM 1 MG PO TABS: 1.0000 mg | ORAL_TABLET | ORAL | 0 refills | Status: DC | PRN

## 2022-07-28 MED ORDER — ONDANSETRON 4 MG PO TBDP: 4.0000 mg | ORAL_TABLET | Freq: Four times a day (QID) | ORAL | Status: DC | PRN

## 2022-07-28 MED ORDER — ONDANSETRON HCL 4 MG/2ML IJ SOLN: 4.0000 mg | Freq: Four times a day (QID) | INTRAMUSCULAR | Status: DC | PRN

## 2022-07-28 MED ORDER — ACETAMINOPHEN 650 MG RE SUPP: 650.0000 mg | Freq: Four times a day (QID) | RECTAL | Status: DC | PRN

## 2022-07-28 MED ORDER — HALOPERIDOL LACTATE 5 MG/ML IJ SOLN: 0.5000 mg | INTRAMUSCULAR | Status: DC | PRN

## 2022-07-28 MED ORDER — ENSURE ENLIVE PO LIQD: 237.0000 mL | Freq: Two times a day (BID) | ORAL | Status: DC

## 2022-07-28 MED ORDER — HYDROMORPHONE HCL 1 MG/ML IJ SOLN
0.5000 mg | INTRAMUSCULAR | Status: DC | PRN
  Administered 2022-07-28: 1 mg via INTRAVENOUS
  Administered 2022-07-28 (×2): 0.5 mg via INTRAVENOUS
  Administered 2022-07-29 (×3): 1 mg via INTRAVENOUS
  Filled 2022-07-28 (×6): qty 1

## 2022-07-28 MED ORDER — POLYVINYL ALCOHOL 1.4 % OP SOLN: 1.0000 [drp] | Freq: Four times a day (QID) | OPHTHALMIC | 0 refills | Status: DC | PRN

## 2022-07-28 MED ORDER — GLYCOPYRROLATE 0.2 MG/ML IJ SOLN: 0.2000 mg | INTRAMUSCULAR | Status: DC | PRN

## 2022-07-28 MED ORDER — HALOPERIDOL 0.5 MG PO TABS: 0.5000 mg | ORAL_TABLET | ORAL | Status: DC | PRN

## 2022-07-28 MED ORDER — LORAZEPAM 1 MG PO TABS: 1.0000 mg | ORAL_TABLET | ORAL | Status: DC | PRN

## 2022-07-28 MED ORDER — HALOPERIDOL LACTATE 2 MG/ML PO CONC: 0.5000 mg | ORAL | Status: DC | PRN

## 2022-07-28 MED ORDER — POLYVINYL ALCOHOL 1.4 % OP SOLN: 1.0000 [drp] | Freq: Four times a day (QID) | OPHTHALMIC | Status: DC | PRN

## 2022-07-28 NOTE — Progress Notes (Signed)
Patient  refused to take all her oral night medication; daughter tried as well same not successful. She was being verbally abusive stating that she was going to throw her cup of water on me, I should get ou and I am a stinker. Took iv dilaudid for pain only after coaching her to allow me to administer her pain medication.. patient stated that she is in pain when urinating according to daughter. I personally observed on scenario of this. Where she is moaning when she is urinating.

## 2022-07-28 NOTE — Discharge Summary (Signed)
Physician Discharge Summary   Patient: [name] MRN: [id number] DOB: Feb 22, 1929  Admit date:     07/26/2022  Discharge date: 07/28/22  Discharge Physician: Fritzi Mandes   PCP: Thea Gist, NP    Discharge Diagnoses: Active Problems:   Sprain hip/thigh, right, ( high-grade muscle strain/high-grade partial-thickness tear psoas)   Fall at home, initial encounter   Ambulatory dysfunction   GAD (generalized anxiety disorder)   IBS (irritable bowel syndrome)   Peripheral vascular disease (HCC)   Hypertension   Atrial fibrillation, chronic (HCC)   Chronic anticoagulation  89 y.o.f whx of A.fib on Eliquis, aortic stenosis, HTN,who resides in an ILF and is ambulant with walker at baseline who presents to the ED following a fall the day prior resulting in progressively worsening pain over the right hip and groin area to where she is unable to bear weight on the hip. Seen in the ED vitals with uncontrolled blood pressure labs fairly stable low BMI 16.4  Lower extremity DopplerNegative for DVT  CT abd/Pelvis showed moderate left hydronephrosis without definite ureteral dilatation suggesting uteropelvic junction stenosis  MRI Rt [id number] for fracture but consistent with right hip strain with hematoma: Marked intramuscular edema of the right iliopsoas muscle which is most prominent at its insertion about the lesser trochanter consistent with high-grade muscle strain/high-grade partial-thickness tear.Small fluid collection measuring 1.5 x 1.5 cm suggesting small hematoma.Mild edema about the origin of the right hamstring muscles suggesting muscle strain.  Sprain hip/thigh on right secondary to fall High-grade muscle strain/high-grade partial-thickness tear psoas Fall at home Ambulatory dysfunction: Continue pain management muscle relaxant, IV pain meds. Given patient's overall decline and confusion discussion was led by palliative care nurse practitioner with patient's daughter  and overall patient has a poor outcome/prognosis. Daughter [name] would like comfort care and transition to hospice given overall decline. -- Seen by hospice lesion on. Patient was transferred to hospice facility today. Spoke with daughter over the phone and she is in agreement.   Chronic atrial fibrillation  HTN  Irritable bowel syndrome continue loperamide  GAD  Mild normocytic anemia stable outpatient follow-up  Lower extremity hyperkeratosis/skin changes/skin cancer per daughter  Moderate left hydronephrosis without definite ureteral dilatation suggesting uteropelvic junction stenosis-incidentally noted on CT, will need outpatient urology follow-up-UA unremarkable, has normal creatinine    DVT prophylaxis: comfort care Code Status:   Code Status: DNR  Disposition: Hospice care Diet recommendation:  Regular diet DISCHARGE MEDICATION: Allergies as of 07/28/2022       Reactions   Penicillin G Diarrhea   Bacitracin    Unknown   Neomycin    unknown   Penicillamine Other (See Comments)   Penicillins Diarrhea        Medication List     STOP taking these medications    amLODipine 5 MG tablet Commonly known as: NORVASC   apixaban 2.5 MG Tabs tablet Commonly known as: Eliquis   clonazePAM 1 MG tablet Commonly known as: KLONOPIN   cyanocobalamin 1000 MCG tablet Commonly known as: VITAMIN B12   irbesartan 300 MG tablet Commonly known as: AVAPRO   labetalol 300 MG tablet Commonly known as: NORMODYNE   loperamide 2 MG capsule Commonly known as: IMODIUM   melatonin 3 MG Tabs tablet   Multivitamin Adult (Minerals) Tabs   predniSONE 5 MG tablet Commonly known as: DELTASONE   sertraline 25 MG tablet Commonly known as: ZOLOFT   SYSTANE OP   traMADol 50 MG tablet Commonly known as: Veatrice Bourbon  TAKE these medications    glycopyrrolate 1 MG tablet Commonly known as: ROBINUL Take 1 tablet (1 mg total) by mouth every 4 (four) hours as needed (excessive  secretions).   haloperidol 0.5 MG tablet Commonly known as: HALDOL Take 1 tablet (0.5 mg total) by mouth every 4 (four) hours as needed for agitation (or delirium).   LORazepam 1 MG tablet Commonly known as: ATIVAN Take 1 tablet (1 mg total) by mouth every 4 (four) hours as needed for anxiety.   montelukast 10 MG tablet Commonly known as: SINGULAIR Take 1 tablet (10 mg total) by mouth daily.   morphine CONCENTRATE 10 mg / 0.5 ml concentrated solution Take 0.5 mLs (10 mg total) by mouth every 3 (three) hours as needed for severe pain or anxiety.   polyvinyl alcohol 1.4 % ophthalmic solution Commonly known as: LIQUIFILM TEARS Place 1 drop into both eyes 4 (four) times daily as needed for dry eyes.         Condition at discharge: poor  The results of significant diagnostics from this hospitalization (including imaging, microbiology, ancillary and laboratory) are listed below for reference.   Imaging Studies: MR HIP RIGHT WO CONTRAST  Result Date: 07/26/2022 CLINICAL DATA:  Hip trauma, fracture suspected EXAM: MR OF THE RIGHT HIP WITHOUT CONTRAST TECHNIQUE: Multiplanar, multisequence MR imaging was performed. No intravenous contrast was administered. COMPARISON:  CT examination dated July 26, 2022 FINDINGS: Bone No hip fracture, dislocation or avascular necrosis. No aggressive osseous lesion. SI joints are normal. No SI joint widening or erosive changes. Marked levoscoliosis of the lumbar spine centered at L3 vertebral body Alignment Normal. No subluxation. Dysplasia None. Joint effusion No joint effusions. Labrum Advanced degeneration/diminution of the labrum. Cartilage Near complete loss of the cartilage with subchondral cystic changes about the superolateral aspect of the hip joint, chronic process Capsule and ligaments Normal. Muscles and Tendons There is marked intramuscular edema of the right iliopsoas muscle which is severe about its insertion about the lesser trochanter.  Small fluid collection measuring a proximally 1.5 x 1.5 cm suggesting small hematoma. There is also edema about the origin of the right hamstring muscles. Mild edema along the right gluteus medius/minimus insertion Other Findings No bursal fluid. Viscera No abnormality seen in pelvis. No lymphadenopathy. No free fluid in the pelvis. IMPRESSION: 1. No evidence of hip fracture or dislocation. 2. Marked intramuscular edema of the right iliopsoas muscle which is most prominent at its insertion about the lesser trochanter consistent with high-grade muscle strain/high-grade partial-thickness tear. 3. Small fluid collection measuring 1.5 x 1.5 cm suggesting small hematoma. 4. Mild edema about the origin of the right hamstring muscles suggesting muscle strain. 5. Mild-to-moderate right hip osteoarthritis with advanced degeneration of the labrum and near complete loss of articular cartilage with subchondral cystic changes. 6. Marked levoscoliosis of the lumbar spine centered at L3 vertebral body. Electronically Signed   By: Keane Police D.O.   On: 07/26/2022 22:14   CT ABDOMEN PELVIS W CONTRAST  Result Date: 07/26/2022 CLINICAL DATA:  Acute pelvic pain. EXAM: CT ABDOMEN AND PELVIS WITH CONTRAST TECHNIQUE: Multidetector CT imaging of the abdomen and pelvis was performed using the standard protocol following bolus administration of intravenous contrast. RADIATION DOSE REDUCTION: This exam was performed according to the departmental dose-optimization program which includes automated exposure control, adjustment of the mA and/or kV according to patient size and/or use of iterative reconstruction technique. CONTRAST:  5m OMNIPAQUE IOHEXOL 300 MG/ML  SOLN COMPARISON:  None Available. FINDINGS: Lower chest:  No acute abnormality. Hepatobiliary: No focal liver abnormality is seen. No gallstones, gallbladder wall thickening, or biliary dilatation. Pancreas: Unremarkable. No pancreatic ductal dilatation or surrounding  inflammatory changes. Spleen: Normal in size without focal abnormality. Adrenals/Urinary Tract: Adrenal glands appear normal. Severe right renal atrophy is noted. Moderate left hydronephrosis is noted although no definite ureteral dilatation is noted, suggesting ureteropelvic junction stenosis. Urinary bladder is unremarkable. Stomach/Bowel: The stomach appears normal. There is no evidence of bowel obstruction or inflammation. Vascular/Lymphatic: Aortic atherosclerosis. No enlarged abdominal or pelvic lymph nodes. Probable calcified 11 mm distal splenic artery aneurysm is noted. Reproductive: Status post hysterectomy. No adnexal masses. Other: No abdominal wall hernia or abnormality. No abdominopelvic ascites. Musculoskeletal: Moderate levoscoliosis of lumbar spine is noted. No fracture or dislocation is noted. IMPRESSION: Moderate left hydronephrosis is noted without definite ureteral dilatation, suggesting ureteropelvic junction stenosis. Severe right renal atrophy is noted. Probable calcified 11 mm distal splenic artery aneurysm is noted. Aortic Atherosclerosis (ICD10-I70.0). Electronically Signed   By: Marijo Conception M.D.   On: 07/26/2022 15:49   US Venous Img Lower Unilateral Right  Result Date: 07/26/2022 CLINICAL DATA:  Right lower extremity pain. EXAM: Right LOWER EXTREMITY VENOUS DOPPLER ULTRASOUND TECHNIQUE: Gray-scale sonography with compression, as well as color and duplex ultrasound, were performed to evaluate the deep venous system(s) from the level of the common femoral vein through the popliteal and proximal calf veins. COMPARISON:  April 22, 2016. FINDINGS: VENOUS Normal compressibility of the common femoral, superficial femoral, and popliteal veins, as well as the visualized calf veins. Visualized portions of profunda femoral vein and great saphenous vein unremarkable. No filling defects to suggest DVT on grayscale or color Doppler imaging. Doppler waveforms show normal direction of venous  flow, normal respiratory plasticity and response to augmentation. Limited views of the contralateral common femoral vein are unremarkable. OTHER None. Limitations: Calf veins are not well visualized due to overlying ulcerations. IMPRESSION: No definite evidence of deep venous thrombosis seen in right lower extremity. Electronically Signed   By: Marijo Conception M.D.   On: 07/26/2022 15:10    Microbiology: Results for orders placed or performed in visit on 01/24/21  Microscopic Examination     Status: None   Collection Time: 01/24/21  3:17 PM   Urine  Result Value Ref Range Status   WBC, UA None seen 0 - 5 /hpf Final   RBC, Urine None seen 0 - 2 /hpf Final   Epithelial Cells (non renal) 0-10 0 - 10 /hpf Final   Casts None seen None seen /lpf Final   Bacteria, UA None seen None seen/Few Final  Urine Culture, Reflex     Status: None   Collection Time: 01/24/21  3:17 PM   Urine  Result Value Ref Range Status   Urine Culture, Routine Final report  Final   Organism ID, Bacteria Comment  Final    Comment: Mixed urogenital flora Less than 10,000 colonies/mL     Labs: CBC: Recent Labs  Lab 07/26/22 1427  WBC 7.6  NEUTROABS 6.0  HGB 10.9*  HCT 32.6*  MCV 96.7  PLT [id number]   Basic Metabolic Panel: Recent Labs  Lab 07/26/22 1427  NA 138  K 3.8  CL 107  CO2 22  GLUCOSE 98  BUN 39*  CREATININE 1.19*  CALCIUM 9.4   Liver Function Tests: Recent Labs  Lab 07/26/22 1427  AST 29  ALT 32  ALKPHOS 90  BILITOT 1.0  PROT 5.6*  ALBUMIN 3.7  Discharge time spent: greater than 30 minutes.  Signed: Fritzi Mandes, MD [facility] 07/28/2022

## 2022-07-28 NOTE — Plan of Care (Signed)

## 2022-07-28 NOTE — Plan of Care (Signed)

## 2022-07-28 NOTE — TOC Transition Note (Signed)
Transition of Care [facility]) - CM/SW Discharge Note   Patient Details  Name: [name] MRN: [id number] Date of Birth: 1929/03/13  Transition of Care [facility]) CM/SW Contact:  Gerilyn Pilgrim, LCSW Phone Number: 07/28/2022, 4:08 PM   Clinical Narrative:   Pt has orders to discharge to [facility]. Medical necessity printed to unit. [facility] arranged 8 in front of patient currently.          Patient Goals and CMS Choice      Discharge Placement                         Discharge Plan and Services Additional resources added to the After Visit Summary for                                       Social Determinants of Health (SDOH) Interventions SDOH Screenings   Food Insecurity: No Food Insecurity (07/27/2022)  Housing: Low Risk  (07/27/2022)  Transportation Needs: No Transportation Needs (07/27/2022)  Utilities: Not At Risk (07/27/2022)  Alcohol Screen: Low Risk  (07/14/2021)  Depression (PHQ2-9): Low Risk  (01/25/2022)  Tobacco Use: Low Risk  (07/27/2022)     Readmission Risk Interventions     No data to display

## 2022-07-28 NOTE — Care Management Important Message (Signed)
Important Message  Patient Details  Name: [name] MRN: [id number] Date of Birth: Dec 09, 1928   Medicare Important Message Given:  N/A - LOS <3 / Initial given by admissions     Juliann Pulse A Elizebath Wever 07/28/2022, 7:40 AM

## 2022-07-28 NOTE — Progress Notes (Addendum)
[facility]) Hospice hospital liaison note   Referral received for Hospice Home. Talked with daughter and son in law and discussed services.   Patient has been approved for hospice home and  is able to accept patient this afternoon once consents are complete.    RN staff, you may call report at any time to [phone], room is assigned when report is called.  Please leave IV intact.    Please send completed DNR with patient.   Updated attending and [facility] manager via [place]. Thank you for the opportunity to participate in this patient's care Jhonnie Garner, BSN, RN Hospice nurse liaison [phone]

## 2022-07-28 NOTE — Consult Note (Addendum)
Consultation Note Date: 07/28/2022   Patient Name: [name]  DOB: 05/06/1929  MRN: [id number]  Age / Sex: 89 y.o., female  PCP: Thea Gist, NP Referring Physician: Fritzi Mandes, MD  Reason for Consultation: Establishing goals of care  HPI/Patient Profile:   Clinical Assessment and Goals of Care: Notes and labs reviewed.  In to speak with patient and her daughter.  Daughter states patient is widowed x 2.  She states her mother had 2 children, she and her brother, and she states her brother has died.  She states they have all died at hospice facility, and they have had good experiences with the company.  Daughter's husband entered into room as well.  Daughter discusses patient's refusal of medications and care.  She states her mother has not eaten since yesterday morning.  Daughter discusses that patient does not want to work with therapy here.  Daughter further discusses that patient does not want to work with therapy at [facility].  She states she wants to honor what ever her mother would like to do, and would like her mother to make the decisions on care moving forward.  She discusses that her mother lives in assisted living and has been becoming confused over the past weeks, and they have been considering transitioning her to memory care at the facility.  Patient is unable to tell me where she is, or why she is here.    We discussed her diagnosis, prognosis, GOC, EOL wishes disposition and options.  Created space and opportunity for patient  to explore thoughts and feelings regarding current medical information.   A detailed discussion was had today regarding advanced directives.  Concepts specific to code status, artifical feeding and hydration, IV antibiotics and rehospitalization were discussed.  The difference between an aggressive medical intervention path and a comfort care path was  discussed, including medications for symptom management in both scenarios.  Values and goals of care important to patient and family were attempted to be elicited.  Discussed limitations of medical interventions to prolong quality of life in some situations and discussed the concept of human mortality.  Daughter speaks with patient in an effort to try to understand her wishes.  With attempting to understand patient's wishes, patient was noted to be very confused giving varying responses to the same questions.  During the meeting she was yelling out and stated that she wanted something for hip pain.  Nurse in to bedside to provide medication, and patient states she needs something for pain, but then states that she does not want to take any medications for pain, either oral or IV, stating that we are all trying to kill her. Nurse and I attempted to talk with patient, explain, answer questions, and provide pain medication.  Patient continued stating she needs pain medication and then pulling away, sticking her tongue out at [place], and then saying no to the medication.  Daughter pleaded with her and attempted to rationalize with patient however patient continued to request  something for pain, and  then state that she would not take anything.  Patient stated her head hurt and that she wanted [place] to cut it off.  When the statement was reflected back to her, she stated at least if we cut her head off she would have hope.  Daughter states she would like to transition patient to comfort care and proceed with hospice facility placement.   I completed a MOST form today with daughter and the signed original was placed in the chart. The form was scanned and sent to medical records for it to be uploaded under ACP tab in Epic. A photocopy was also placed in the chart to be scanned into EMR. The patient outlined their wishes for the following treatment decisions:  Cardiopulmonary Resuscitation: Do Not Attempt Resuscitation  (DNR/No CPR)  Medical Interventions: Comfort Measures: Keep clean, warm, and dry. Use medication by any route, positioning, wound care, and other measures to relieve pain and suffering. Use oxygen, suction and manual treatment of airway obstruction as needed for comfort. Do not transfer to the hospital unless comfort needs cannot be met in current location.  Antibiotics: No antibiotics (use other measures to relieve symptoms)  IV Fluids: No IV fluids (provide other measures to ensure comfort)  Feeding Tube: No feeding tube       SUMMARY OF RECOMMENDATIONS   Recommend hospice facility placement for end-of-life care and IV symptom management.  Comfort medications in place for symptom management.  Prognosis:  < 2 weeks       Primary Diagnoses: Present on Admission:  GAD (generalized anxiety disorder)  IBS (irritable bowel syndrome)  Peripheral vascular disease ([place])  Hypertension   I have reviewed the medical record, interviewed the patient and family, and examined the patient. The following aspects are pertinent.  Past Medical History:  Diagnosis Date   A-fib ([place])    Asthma    in past   Heart murmur    Pt states she has Heart Murmur   Hypertension    Lower leg injury 04/24/2016   08/06/17- pt reports legs still not completely healed   Renal insufficiency    10%-Right, 90%-Left (per pt)   Skin cancer    Social History   Socioeconomic History   Marital status: Widowed    Spouse name: Not on file   Number of children: Not on file   Years of education: Not on file   Highest education level: Not on file  Occupational History   Not on file  Tobacco Use   Smoking status: Never   Smokeless tobacco: Never  Vaping Use   Vaping Use: Never used  Substance and Sexual Activity   Alcohol use: No   Drug use: No   Sexual activity: Not on file  Other Topics Concern   Not on file  Social History Narrative   Not on file   Social Determinants of Health   Financial  Resource Strain: Not on file  Food Insecurity: No Food Insecurity (07/27/2022)   Hunger Vital Sign    Worried About Running Out of Food in the Last Year: Never true    Ran Out of Food in the Last Year: Never true  Transportation Needs: No Transportation Needs (07/27/2022)   PRAPARE - Hydrologist (Medical): No    Lack of Transportation (Non-Medical): No  Physical Activity: Not on file  Stress: Not on file  Social Connections: Not on file   Family History  Problem Relation Age of Onset   Hypertension  Mother    Cancer Sister    Cancer Niece    Cancer Nephew    Scheduled Meds:  feeding supplement  237 mL Oral BID BM   montelukast  10 mg Oral Daily   Continuous Infusions:  methocarbamol (ROBAXIN) IV 500 mg (07/27/22 1142)   PRN Meds:.acetaminophen **OR** acetaminophen, albuterol, antiseptic oral rinse, glycopyrrolate **OR** glycopyrrolate **OR** glycopyrrolate, haloperidol **OR** haloperidol **OR** haloperidol lactate, HYDROmorphone (DILAUDID) injection, loperamide, LORazepam **OR** LORazepam **OR** LORazepam, methocarbamol (ROBAXIN) IV, ondansetron **OR** ondansetron (ZOFRAN) IV, polyvinyl alcohol Medications Prior to Admission:  Prior to Admission medications   Medication Sig Start Date End Date Taking? Authorizing Provider  amLODipine (NORVASC) 5 MG tablet Take 5 mg by mouth daily. 10/29/21  Yes [provider]  apixaban (ELIQUIS) 2.5 MG TABS tablet Take 1 tablet (2.5 mg total) by mouth 2 (two) times daily. 07/03/22  Yes Gollan, Kathlene November, MD  irbesartan (AVAPRO) 300 MG tablet TAKE 1/2 TABLET (150 MG TOTAL) BY MOUTH IN THE MORNING AND 1/2 TABLET AT BEDTIME. 01/25/22  Yes Abernathy, Alyssa, NP  labetalol (NORMODYNE) 300 MG tablet Take 1 tablet (300 mg total) by mouth 2 (two) times daily. 03/21/22  Yes Gollan, Kathlene November, MD  melatonin 3 MG TABS tablet Take 3 mg by mouth at bedtime. 07/03/22  Yes [provider]  montelukast (SINGULAIR) 10 MG  tablet Take 1 tablet (10 mg total) by mouth daily. 01/25/22  Yes Abernathy, Yetta Flock, NP  Multiple Vitamins-Minerals (MULTIVITAMIN ADULT, MINERALS,) TABS Take 1 tablet by mouth daily. 07/18/22  Yes [provider]  predniSONE (DELTASONE) 5 MG tablet TAKE 1 TABLET BY MOUTH EVERY DAY WITH BREAKFAST 01/25/22  Yes Abernathy, Alyssa, NP  sertraline (ZOLOFT) 25 MG tablet Take 25 mg by mouth daily. 07/03/22  Yes [provider]  traMADol (ULTRAM) 50 MG tablet Take 25 mg (1/2 tablet) every morning. Can take extra 25 mg in afternoon as needed. 04/14/22  Yes [provider]  vitamin B-12 (CYANOCOBALAMIN) 1000 MCG tablet Take 1,000 mcg by mouth daily.   Yes [provider]  clonazePAM (KLONOPIN) 1 MG tablet TAKE 1/2 TO 1 TABLET BY MOUTH AT BEDTIME AS NEEDED FOR INSOMNIA 04/06/22   Jonetta Osgood, NP  loperamide (IMODIUM) 2 MG capsule Take 1 capsule (2 mg total) by mouth 4 (four) times daily as needed for diarrhea or loose stools. 07/20/22   Jonetta Osgood, NP  Polyethyl Glycol-Propyl Glycol (SYSTANE OP) Apply to eye daily.    [provider]   Allergies  Allergen Reactions   Penicillin G Diarrhea   Bacitracin     Unknown    Neomycin     unknown   Penicillamine Other (See Comments)   Penicillins Diarrhea   Review of Systems  Musculoskeletal:        Hip pain    Physical Exam Pulmonary:     Effort: Pulmonary effort is normal.  Neurological:     Mental Status: She is alert.     Vital Signs: BP (!) 169/74 (BP Location: Left Arm)   Pulse 75   Temp 97.9 F (36.6 C)   Resp 17   Ht 5' (1.524 m)   Wt 41.1 kg   SpO2 100%   BMI 17.70 kg/m  Pain Scale: 0-10   Pain Score: 8    SpO2: SpO2: 100 % O2 Device:SpO2: 100 % O2 Flow Rate: .   IO: Intake/output summary:  Intake/Output Summary (Last 24 hours) at 07/28/2022 1206 Last data filed at 07/28/2022 0736 Gross per 24  hour  Intake --  Output 500 ml  Net -500 ml    LBM:   Baseline Weight: Weight:  38.2 kg Most recent weight: Weight: 41.1 kg       Signed by: Asencion Gowda, NP   Please contact Palliative Medicine Team phone at [phone] for questions and concerns.  For individual provider: See Shea Evans

## 2022-07-28 NOTE — Progress Notes (Signed)
Nutrition Brief Note  Chart reviewed. Pt now transitioning to comfort care.  No further nutrition interventions planned at this time.  Please re-consult as needed.   Jennette Leask W, RD, LDN, CDCES Registered Dietitian II Certified Diabetes Care and Education Specialist Please refer to [facility] for RD and/or RD on-call/weekend/after hours pager   

## 2022-07-28 NOTE — Progress Notes (Signed)
PT Cancellation Note  Patient Details Name: [name] MRN: [id number] DOB: 23-Oct-1928   Cancelled Treatment:    Reason Eval/Treat Not Completed:  (Patient unavailable. Family meeting with Palliative Care in the room. PT to follow up as appropriate)  Minna Merritts, PT, MPT   Percell Locus 07/28/2022, 10:27 AM

## 2022-07-29 DIAGNOSIS — F411 Generalized anxiety disorder: Secondary | ICD-10-CM | POA: Diagnosis not present

## 2022-07-29 DIAGNOSIS — W19XXXA Unspecified fall, initial encounter: Secondary | ICD-10-CM | POA: Diagnosis not present

## 2022-07-29 DIAGNOSIS — S73101A Unspecified sprain of right hip, initial encounter: Secondary | ICD-10-CM | POA: Diagnosis not present

## 2022-07-29 DIAGNOSIS — R262 Difficulty in walking, not elsewhere classified: Secondary | ICD-10-CM | POA: Diagnosis not present

## 2022-07-29 NOTE — TOC Transition Note (Signed)
Transition of Care [facility]) - CM/SW Discharge Note   Patient Details  Name: [name] MRN: [id number] Date of Birth: 21-Oct-1928  Transition of Care [facility]) CM/SW Contact:  Izola Price, RN Phone Number: 07/29/2022, 9:10 AM   Clinical Narrative:  07/29/22: [id number] [facility] called for transport. Everything was readied 07/28/22, but updated EMS transport form and printed that and facesheet to unit printer and notified Unit RN. It was confirmed by hospice liaison that bed was still available at residential hospice facility via [place]. Hospice liaison spoke to daughter and son in law and received consents on 07/28/22. Report to be called to [phone]. To leave IV intact and to send completed DNR form with patient per hospice liaison Lew Dawes RN CM     Final next level of care: [place] Barriers to Discharge: Barriers Resolved   Patient Goals and CMS Choice      Discharge Placement                  Patient to be transferred to facility by: [facility]   Patient and family notified of of transfer: 07/28/22  Discharge Plan and Services Additional resources added to the After Visit Summary for                  DME Arranged: N/A DME Agency: NA       HH Arranged: NA [place] Agency: NA        Social Determinants of Health (SDOH) Interventions SDOH Screenings   Food Insecurity: No Food Insecurity (07/27/2022)  Housing: Low Risk  (07/27/2022)  Transportation Needs: No Transportation Needs (07/27/2022)  Utilities: Not At Risk (07/27/2022)  Alcohol Screen: Low Risk  (07/14/2021)  Depression (PHQ2-9): Low Risk  (01/25/2022)  Tobacco Use: Low Risk  (07/27/2022)     Readmission Risk Interventions     No data to display

## 2022-07-29 NOTE — Plan of Care (Signed)
  Problem: Health Behavior/Discharge Planning: Goal: Ability to manage health-related needs will improve Outcome: Progressing Note: Patient experiencing confusion and on comfort care. Will continue to reorient and educate as needed.   Problem: Clinical Measurements: Goal: Ability to maintain clinical measurements within normal limits will improve Outcome: Progressing Goal: Will remain free from infection Outcome: Progressing Goal: Diagnostic test results will improve Outcome: Progressing Goal: Respiratory complications will improve Outcome: Progressing Goal: Cardiovascular complication will be avoided Outcome: Progressing   Problem: Activity: Goal: Risk for activity intolerance will decrease Outcome: Progressing   Problem: Nutrition: Goal: Adequate nutrition will be maintained Outcome: Progressing   Problem: Coping: Goal: Level of anxiety will decrease Outcome: Progressing   Problem: Elimination: Goal: Will not experience complications related to bowel motility Outcome: Progressing Goal: Will not experience complications related to urinary retention Outcome: Progressing   Problem: Pain Managment: Goal: General experience of comfort will improve Outcome: Progressing   Problem: Safety: Goal: Ability to remain free from injury will improve Outcome: Progressing   Problem: Skin Integrity: Goal: Risk for impaired skin integrity will decrease Outcome: Progressing   Problem: Clinical Measurements: Goal: Quality of life will improve Outcome: Progressing   Problem: Respiratory: Goal: Verbalizations of increased ease of respirations will increase Outcome: Progressing   Problem: Role Relationship: Goal: Family's ability to cope with current situation will improve Outcome: Progressing Goal: Ability to verbalize concerns, feelings, and thoughts to partner or family member will improve Outcome: Progressing   Problem: Pain Management: Goal: Satisfaction with pain  management regimen will improve Outcome: Progressing   Problem: Education: Goal: Knowledge of General Education information will improve Description: Including pain rating scale, medication(s)/side effects and non-pharmacologic comfort measures Outcome: Not Progressing Note: Patient experiencing confusion and on comfort care. Will continue to reorient and educate as needed.   Problem: Education: Goal: Knowledge of the prescribed therapeutic regimen will improve Outcome: Not Progressing Note: Patient experiencing confusion and on comfort care. Will continue to reorient and educate as needed.   Problem: Coping: Goal: Ability to identify and develop effective coping behavior will improve Outcome: Not Progressing Note: Patient experiencing confusion and on comfort care. Will continue to reorient and educate as needed.

## 2022-07-29 NOTE — Progress Notes (Signed)
Patient discharged to Hospice house via EMS. All belongings sent home with patient and family. Peripheral IV intact and in place to be used at [facility].

## 2022-07-29 NOTE — Discharge Summary (Signed)
Physician Discharge Summary   Patient: [name] MRN: [id number] DOB: 1928/09/01  Admit date:     07/26/2022  Discharge date: 07/29/22  Discharge Physician: Fritzi Mandes   PCP: Thea Gist, NP    Discharge Diagnoses: Active Problems:   Sprain hip/thigh, right, ( high-grade muscle strain/high-grade partial-thickness tear psoas)   Fall at home, initial encounter   Ambulatory dysfunction   GAD (generalized anxiety disorder)   IBS (irritable bowel syndrome)   Peripheral vascular disease (HCC)   Hypertension   Atrial fibrillation, chronic (HCC)   Chronic anticoagulation  89 y.o.f whx of A.fib on Eliquis, aortic stenosis, HTN,who resides in an ILF and is ambulant with walker at baseline who presents to the ED following a fall the day prior resulting in progressively worsening pain over the right hip and groin area to where she is unable to bear weight on the hip. Seen in the ED vitals with uncontrolled blood pressure labs fairly stable low BMI 16.4  Lower extremity DopplerNegative for DVT  CT abd/Pelvis showed moderate left hydronephrosis without definite ureteral dilatation suggesting uteropelvic junction stenosis  MRI Rt [id number] for fracture but consistent with right hip strain with hematoma: Marked intramuscular edema of the right iliopsoas muscle which is most prominent at its insertion about the lesser trochanter consistent with high-grade muscle strain/high-grade partial-thickness tear.Small fluid collection measuring 1.5 x 1.5 cm suggesting small hematoma.Mild edema about the origin of the right hamstring muscles suggesting muscle strain.  Sprain hip/thigh on right secondary to fall High-grade muscle strain/high-grade partial-thickness tear psoas Fall at home Ambulatory dysfunction: Continue pain management muscle relaxant, IV pain meds. Given patient's overall decline and confusion discussion was led by palliative care nurse practitioner with patient's daughter  and overall patient has a poor outcome/prognosis. Daughter [name] would like comfort care and transition to hospice given overall decline. -- Seen by hospice lesion on. Patient was transferred to hospice facility today. Spoke with daughter at bedside. Family requested transfer be done today. It was late yday evening when EMS came by.   Chronic atrial fibrillation  HTN  Irritable bowel syndrome continue loperamide  GAD  Mild normocytic anemia stable outpatient follow-up  Lower extremity hyperkeratosis/skin changes/skin cancer per daughter  Moderate left hydronephrosis without definite ureteral dilatation suggesting uteropelvic junction stenosis-incidentally noted on CT    DVT prophylaxis: comfort care Code Status:   Code Status: DNR  Disposition: Hospice care Diet recommendation:  Regular diet DISCHARGE MEDICATION: Allergies as of 07/29/2022       Reactions   Penicillin G Diarrhea   Bacitracin    Unknown   Neomycin    unknown   Penicillamine Other (See Comments)   Penicillins Diarrhea        Medication List     STOP taking these medications    amLODipine 5 MG tablet Commonly known as: NORVASC   apixaban 2.5 MG Tabs tablet Commonly known as: Eliquis   clonazePAM 1 MG tablet Commonly known as: KLONOPIN   cyanocobalamin 1000 MCG tablet Commonly known as: VITAMIN B12   irbesartan 300 MG tablet Commonly known as: AVAPRO   labetalol 300 MG tablet Commonly known as: NORMODYNE   loperamide 2 MG capsule Commonly known as: IMODIUM   melatonin 3 MG Tabs tablet   Multivitamin Adult (Minerals) Tabs   predniSONE 5 MG tablet Commonly known as: DELTASONE   sertraline 25 MG tablet Commonly known as: ZOLOFT   SYSTANE OP   traMADol 50 MG tablet Commonly known as: Veatrice Bourbon  TAKE these medications    glycopyrrolate 1 MG tablet Commonly known as: ROBINUL Take 1 tablet (1 mg total) by mouth every 4 (four) hours as needed (excessive secretions).    haloperidol 0.5 MG tablet Commonly known as: HALDOL Take 1 tablet (0.5 mg total) by mouth every 4 (four) hours as needed for agitation (or delirium).   LORazepam 1 MG tablet Commonly known as: ATIVAN Take 1 tablet (1 mg total) by mouth every 4 (four) hours as needed for anxiety.   montelukast 10 MG tablet Commonly known as: SINGULAIR Take 1 tablet (10 mg total) by mouth daily.   morphine CONCENTRATE 10 mg / 0.5 ml concentrated solution Take 0.5 mLs (10 mg total) by mouth every 3 (three) hours as needed for severe pain or anxiety.   polyvinyl alcohol 1.4 % ophthalmic solution Commonly known as: LIQUIFILM TEARS Place 1 drop into both eyes 4 (four) times daily as needed for dry eyes.         Condition at discharge: poor  The results of significant diagnostics from this hospitalization (including imaging, microbiology, ancillary and laboratory) are listed below for reference.   Imaging Studies: MR HIP RIGHT WO CONTRAST  Result Date: 07/26/2022 CLINICAL DATA:  Hip trauma, fracture suspected EXAM: MR OF THE RIGHT HIP WITHOUT CONTRAST TECHNIQUE: Multiplanar, multisequence MR imaging was performed. No intravenous contrast was administered. COMPARISON:  CT examination dated July 26, 2022 FINDINGS: Bone No hip fracture, dislocation or avascular necrosis. No aggressive osseous lesion. SI joints are normal. No SI joint widening or erosive changes. Marked levoscoliosis of the lumbar spine centered at L3 vertebral body Alignment Normal. No subluxation. Dysplasia None. Joint effusion No joint effusions. Labrum Advanced degeneration/diminution of the labrum. Cartilage Near complete loss of the cartilage with subchondral cystic changes about the superolateral aspect of the hip joint, chronic process Capsule and ligaments Normal. Muscles and Tendons There is marked intramuscular edema of the right iliopsoas muscle which is severe about its insertion about the lesser trochanter. Small fluid  collection measuring a proximally 1.5 x 1.5 cm suggesting small hematoma. There is also edema about the origin of the right hamstring muscles. Mild edema along the right gluteus medius/minimus insertion Other Findings No bursal fluid. Viscera No abnormality seen in pelvis. No lymphadenopathy. No free fluid in the pelvis. IMPRESSION: 1. No evidence of hip fracture or dislocation. 2. Marked intramuscular edema of the right iliopsoas muscle which is most prominent at its insertion about the lesser trochanter consistent with high-grade muscle strain/high-grade partial-thickness tear. 3. Small fluid collection measuring 1.5 x 1.5 cm suggesting small hematoma. 4. Mild edema about the origin of the right hamstring muscles suggesting muscle strain. 5. Mild-to-moderate right hip osteoarthritis with advanced degeneration of the labrum and near complete loss of articular cartilage with subchondral cystic changes. 6. Marked levoscoliosis of the lumbar spine centered at L3 vertebral body. Electronically Signed   By: Keane Police D.O.   On: 07/26/2022 22:14   CT ABDOMEN PELVIS W CONTRAST  Result Date: 07/26/2022 CLINICAL DATA:  Acute pelvic pain. EXAM: CT ABDOMEN AND PELVIS WITH CONTRAST TECHNIQUE: Multidetector CT imaging of the abdomen and pelvis was performed using the standard protocol following bolus administration of intravenous contrast. RADIATION DOSE REDUCTION: This exam was performed according to the departmental dose-optimization program which includes automated exposure control, adjustment of the mA and/or kV according to patient size and/or use of iterative reconstruction technique. CONTRAST:  66m OMNIPAQUE IOHEXOL 300 MG/ML  SOLN COMPARISON:  None Available. FINDINGS: Lower chest:  No acute abnormality. Hepatobiliary: No focal liver abnormality is seen. No gallstones, gallbladder wall thickening, or biliary dilatation. Pancreas: Unremarkable. No pancreatic ductal dilatation or surrounding inflammatory changes.  Spleen: Normal in size without focal abnormality. Adrenals/Urinary Tract: Adrenal glands appear normal. Severe right renal atrophy is noted. Moderate left hydronephrosis is noted although no definite ureteral dilatation is noted, suggesting ureteropelvic junction stenosis. Urinary bladder is unremarkable. Stomach/Bowel: The stomach appears normal. There is no evidence of bowel obstruction or inflammation. Vascular/Lymphatic: Aortic atherosclerosis. No enlarged abdominal or pelvic lymph nodes. Probable calcified 11 mm distal splenic artery aneurysm is noted. Reproductive: Status post hysterectomy. No adnexal masses. Other: No abdominal wall hernia or abnormality. No abdominopelvic ascites. Musculoskeletal: Moderate levoscoliosis of lumbar spine is noted. No fracture or dislocation is noted. IMPRESSION: Moderate left hydronephrosis is noted without definite ureteral dilatation, suggesting ureteropelvic junction stenosis. Severe right renal atrophy is noted. Probable calcified 11 mm distal splenic artery aneurysm is noted. Aortic Atherosclerosis (ICD10-I70.0). Electronically Signed   By: Marijo Conception M.D.   On: 07/26/2022 15:49   US Venous Img Lower Unilateral Right  Result Date: 07/26/2022 CLINICAL DATA:  Right lower extremity pain. EXAM: Right LOWER EXTREMITY VENOUS DOPPLER ULTRASOUND TECHNIQUE: Gray-scale sonography with compression, as well as color and duplex ultrasound, were performed to evaluate the deep venous system(s) from the level of the common femoral vein through the popliteal and proximal calf veins. COMPARISON:  April 22, 2016. FINDINGS: VENOUS Normal compressibility of the common femoral, superficial femoral, and popliteal veins, as well as the visualized calf veins. Visualized portions of profunda femoral vein and great saphenous vein unremarkable. No filling defects to suggest DVT on grayscale or color Doppler imaging. Doppler waveforms show normal direction of venous flow, normal  respiratory plasticity and response to augmentation. Limited views of the contralateral common femoral vein are unremarkable. OTHER None. Limitations: Calf veins are not well visualized due to overlying ulcerations. IMPRESSION: No definite evidence of deep venous thrombosis seen in right lower extremity. Electronically Signed   By: Marijo Conception M.D.   On: 07/26/2022 15:10    Microbiology: Results for orders placed or performed in visit on 01/24/21  Microscopic Examination     Status: None   Collection Time: 01/24/21  3:17 PM   Urine  Result Value Ref Range Status   WBC, UA None seen 0 - 5 /hpf Final   RBC, Urine None seen 0 - 2 /hpf Final   Epithelial Cells (non renal) 0-10 0 - 10 /hpf Final   Casts None seen None seen /lpf Final   Bacteria, UA None seen None seen/Few Final  Urine Culture, Reflex     Status: None   Collection Time: 01/24/21  3:17 PM   Urine  Result Value Ref Range Status   Urine Culture, Routine Final report  Final   Organism ID, Bacteria Comment  Final    Comment: Mixed urogenital flora Less than 10,000 colonies/mL     Labs: CBC: Recent Labs  Lab 07/26/22 1427  WBC 7.6  NEUTROABS 6.0  HGB 10.9*  HCT 32.6*  MCV 96.7  PLT [id number]    Basic Metabolic Panel: Recent Labs  Lab 07/26/22 1427  NA 138  K 3.8  CL 107  CO2 22  GLUCOSE 98  BUN 39*  CREATININE 1.19*  CALCIUM 9.4    Liver Function Tests: Recent Labs  Lab 07/26/22 1427  AST 29  ALT 32  ALKPHOS 90  BILITOT 1.0  PROT 5.6*  ALBUMIN 3.7  Discharge time spent: greater than 30 minutes.  Signed: Fritzi Mandes, MD [facility] 07/29/2022

## 2022-08-28 ENCOUNTER — Encounter: Payer: Self-pay | Admitting: Nurse Practitioner

## 2022-08-28 DEATH — deceased

## 2023-01-31 ENCOUNTER — Ambulatory Visit: Payer: Medicare Other | Admitting: Nurse Practitioner
